# Patient Record
Sex: Male | Born: 1940 | Race: White | Hispanic: No | Marital: Married | Smoking: Former smoker
Health system: Southern US, Community
[De-identification: ages and names within clinical notes are randomized; demographics above are authoritative.]

## PROBLEM LIST (undated history)

## (undated) DIAGNOSIS — I10 Essential (primary) hypertension: Secondary | ICD-10-CM

## (undated) DIAGNOSIS — R32 Unspecified urinary incontinence: Secondary | ICD-10-CM

## (undated) DIAGNOSIS — J45909 Unspecified asthma, uncomplicated: Secondary | ICD-10-CM

## (undated) DIAGNOSIS — F32A Depression, unspecified: Secondary | ICD-10-CM

## (undated) DIAGNOSIS — C61 Malignant neoplasm of prostate: Secondary | ICD-10-CM

## (undated) DIAGNOSIS — F329 Major depressive disorder, single episode, unspecified: Secondary | ICD-10-CM

## (undated) HISTORY — DX: Unspecified urinary incontinence: R32

## (undated) HISTORY — DX: Malignant neoplasm of prostate: C61

## (undated) HISTORY — DX: Essential (primary) hypertension: I10

## (undated) HISTORY — DX: Unspecified asthma, uncomplicated: J45.909

## (undated) HISTORY — DX: Depression, unspecified: F32.A

---
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
## 1995-10-12 HISTORY — PX: GALLBLADDER SURGERY: SHX652

## 1998-10-11 HISTORY — PX: TOTAL SHOULDER REPLACEMENT: SUR1217

## 1999-10-12 HISTORY — PX: REPLACEMENT TOTAL KNEE BILATERAL: SUR1225

## 2003-10-12 HISTORY — PX: OTHER SURGICAL HISTORY: SHX169

## 2018-09-14 LAB — PULMONARY FUNCTION TEST

## 2019-11-08 ENCOUNTER — Telehealth: Payer: Self-pay | Admitting: Family Medicine

## 2019-11-08 ENCOUNTER — Ambulatory Visit (INDEPENDENT_AMBULATORY_CARE_PROVIDER_SITE_OTHER): Payer: Medicare Other | Admitting: Family Medicine

## 2019-11-08 ENCOUNTER — Other Ambulatory Visit: Payer: Self-pay

## 2019-11-08 ENCOUNTER — Encounter: Payer: Self-pay | Admitting: Family Medicine

## 2019-11-08 VITALS — BP 128/70 | HR 86 | Temp 97.2°F | Ht 67.5 in | Wt 196.0 lb

## 2019-11-08 DIAGNOSIS — F321 Major depressive disorder, single episode, moderate: Secondary | ICD-10-CM

## 2019-11-08 DIAGNOSIS — C61 Malignant neoplasm of prostate: Secondary | ICD-10-CM | POA: Insufficient documentation

## 2019-11-08 DIAGNOSIS — K219 Gastro-esophageal reflux disease without esophagitis: Secondary | ICD-10-CM | POA: Diagnosis not present

## 2019-11-08 DIAGNOSIS — Z125 Encounter for screening for malignant neoplasm of prostate: Secondary | ICD-10-CM

## 2019-11-08 DIAGNOSIS — J45909 Unspecified asthma, uncomplicated: Secondary | ICD-10-CM

## 2019-11-08 DIAGNOSIS — E785 Hyperlipidemia, unspecified: Secondary | ICD-10-CM | POA: Diagnosis not present

## 2019-11-08 DIAGNOSIS — R739 Hyperglycemia, unspecified: Secondary | ICD-10-CM | POA: Diagnosis not present

## 2019-11-08 DIAGNOSIS — E559 Vitamin D deficiency, unspecified: Secondary | ICD-10-CM | POA: Diagnosis not present

## 2019-11-08 DIAGNOSIS — J45998 Other asthma: Secondary | ICD-10-CM

## 2019-11-08 DIAGNOSIS — I10 Essential (primary) hypertension: Secondary | ICD-10-CM | POA: Diagnosis not present

## 2019-11-08 DIAGNOSIS — M5090 Cervical disc disorder, unspecified, unspecified cervical region: Secondary | ICD-10-CM

## 2019-11-08 DIAGNOSIS — Z9849 Cataract extraction status, unspecified eye: Secondary | ICD-10-CM

## 2019-11-08 LAB — LIPID PANEL
Cholesterol: 131 mg/dL (ref 0–200)
HDL: 40 mg/dL (ref >39.00)
LDL Cholesterol: 67 mg/dL (ref 0–99)
NonHDL: 91.48
Total CHOL/HDL Ratio: 3
Triglycerides: 122 mg/dL (ref 0.0–149.0)
VLDL: 24.4 mg/dL (ref 0.0–40.0)

## 2019-11-08 LAB — HEMOGLOBIN A1C: Hgb A1c MFr Bld: 6.6 % — ABNORMAL HIGH (ref 4.6–6.5)

## 2019-11-08 LAB — COMPREHENSIVE METABOLIC PANEL
ALT: 23 U/L (ref 0–53)
AST: 17 U/L (ref 0–37)
Albumin: 4.1 g/dL (ref 3.5–5.2)
Alkaline Phosphatase: 94 U/L (ref 39–117)
BUN: 18 mg/dL (ref 6–23)
CO2: 29 mEq/L (ref 19–32)
Calcium: 9.7 mg/dL (ref 8.4–10.5)
Chloride: 109 mEq/L (ref 96–112)
Creatinine, Ser: 0.63 mg/dL (ref 0.40–1.50)
GFR: 122.87 mL/min (ref >60.00)
Glucose, Bld: 128 mg/dL — ABNORMAL HIGH (ref 70–99)
Potassium: 4.3 mEq/L (ref 3.5–5.1)
Sodium: 144 mEq/L (ref 135–145)
Total Bilirubin: 0.5 mg/dL (ref 0.2–1.2)
Total Protein: 6.5 g/dL (ref 6.0–8.3)

## 2019-11-08 LAB — CBC
HCT: 43.3 % (ref 39.0–52.0)
Hemoglobin: 14.7 g/dL (ref 13.0–17.0)
MCHC: 34 g/dL (ref 30.0–36.0)
MCV: 97.3 fl (ref 78.0–100.0)
Platelets: 247 10*3/uL (ref 150.0–400.0)
RBC: 4.45 Mil/uL (ref 4.22–5.81)
RDW: 14 % (ref 11.5–15.5)
WBC: 8.1 10*3/uL (ref 4.0–10.5)

## 2019-11-08 LAB — PSA, MEDICARE: PSA: 2.04 ng/ml (ref 0.10–4.00)

## 2019-11-08 LAB — VITAMIN D 25 HYDROXY (VIT D DEFICIENCY, FRACTURES): VITD: 38.95 ng/mL (ref 30.00–100.00)

## 2019-11-08 LAB — TSH: TSH: 2.03 u[IU]/mL (ref 0.35–4.50)

## 2019-11-08 MED ORDER — CITALOPRAM HYDROBROMIDE 20 MG PO TABS: 20.0000 mg | ORAL_TABLET | Freq: Every day | ORAL | 3 refills | Status: DC

## 2019-11-08 NOTE — Telephone Encounter (Signed)
LVM for patient to call back and so we can get his insurance information to put towards his appt for today.

## 2019-11-08 NOTE — Progress Notes (Signed)
Austin [name] is a 79 y.o. male who presents today for an office visit.  Assessment/Plan:  Chronic Problems Addressed Today: Dyslipidemia Continue atorvastatin 80 mg daily and fenofibrate 200 mg daily.  Check CBC, C met, TSH, lipid panel.  Status post cataract extraction Will place referral for ophthalmologist in the area.  Prostate cancer ([place]) Stable.  Will place a referral for urology in the area.  Asthma, persistent controlled We will continue Symbicort and prednisone.  Continue albuterol as needed.  Place referral to pulmonology.  Cervical disc disease Place referral to orthopedics in the area.  Depression, major, single episode, moderate (HCC) Poorly controlled.  Will take Wellbutrin off his med list as he is not taking this and it did not work well for him in the past.  Discussed treatment options.  We will start Celexa 20 mg daily.  He will call me in a few weeks.  Gastroesophageal reflux disease Stable.  Continue omeprazole 20 mg daily.  Essential hypertension At goal.  Continue losartan 100 mg daily.  Vitamin D deficiency Check vitamin D level.  Hyperglycemia Check A1c.     Subjective:  HPI:  Patient recently moved here from [place].  He is establishing care.  He request several referrals today.  He has noticed worsening depression and anxiety over the last few months.  Thinks this is mostly related to the stress of moving to the area.  He was prescribed Wellbutrin 150 mg daily past however he stopped that several months ago.  He finds himself with frequently racing thoughts.  Has had some trouble sleeping as well.  No reported SI or HI.  His stable, chronic medical conditions are outlined below:  # Essential Hypertension - On losartan 155m daily and tolerating well  # GERD / Hiatal Hernia - On prilosec 20 mg and tolerating well   # Dyslipidemia - On Lipitor 80 mg daily and fenofibrate 200 mg daily.  # Osteoarthritis / Degeneverative Disc  Disease  - Follows with orthopedics - Uses OTC analgesics   # Asthma - Follows with pulmonology - On symbicort 2 puffs twice daily, prednisone 10 mg daily, and albuterol as needed  # Prostate Cancer - Follows with urology  # s/p cataract surgery - Follows with ophthalmology.  PMH:  The following were reviewed and entered/updated in epic: Past Medical History:  Diagnosis Date  . Asthma   . Depression   . Hypertension   . [place]state cancer ([place]   . Urinary incontinence    Patient Active Problem List   Diagnosis Date Noted  . Hyperglycemia 11/08/2019  . Vitamin D deficiency 11/08/2019  . Essential hypertension 11/08/2019  . Gastroesophageal reflux disease 11/08/2019  . Depression, major, single episode, moderate ([place] 11/08/2019  . Cervical disc disease 11/08/2019  . Asthma, persistent controlled 11/08/2019  . [place]state cancer ([place] 11/08/2019  . Status post cataract extraction 11/08/2019  . Dyslipidemia 11/08/2019   Past Surgical History:  Procedure Laterality Date  . [place] . REPLACEMENT TOTAL KNEE BILATERAL  2001   2007  . surgery replacement Bilateral 2005    Family History  Problem Relation Age of Onset  . Diabetes Brother     Medications- reviewed and updated Current Outpatient Medications  Medication Sig Dispense Refill  . albuterol (VENTOLIN HFA) 108 (90 Base) MCG/ACT inhaler SMARTSIG:2 Puff(s) By Mouth Every 4 Hours PRN    . atorvastatin (LIPITOR) 80 MG tablet Take 80 mg by mouth daily.    . Cyanocobalamin (VITAMIN B 12  PO) Take 1,000 Units by mouth.    . fenofibrate micronized (LOFIBRA) 200 MG capsule Take 200 mg by mouth daily.    Marland Kitchen losartan (COZAAR) 100 MG tablet Take 100 mg by mouth daily.    . magnesium oxide (MAG-OX) 400 MG tablet Take 1 tablet by mouth daily.    Marland Kitchen omeprazole (PRILOSEC) 20 MG capsule Take 20 mg by mouth daily.    . predniSONE (DELTASONE) 10 MG tablet 6 TABS X2DAYS, 5 TABS X2DAYS, 4 TABS X2DAYS, 3 TABS X2DAYS,  2 TABS X2DAYS THEN 1 TAB X2DAYS W/FOOD    . SYMBICORT 160-4.5 MCG/ACT inhaler SMARTSIG:2 Puff(s) By Mouth Twice Daily    . Vitamin D, Cholecalciferol, 50 MCG (2000 UT) CAPS Take by mouth.    . citalopram (CELEXA) 20 MG tablet Take 1 tablet (20 mg total) by mouth daily. 30 tablet 3   No current facility-administered medications for this visit.    Allergies-reviewed and updated No Known Allergies  Social History   Socioeconomic History  . Marital status: Married    Spouse name: Not on file  . Number of children: Not on file  . Years of education: Not on file  . Highest education level: Not on file  Occupational History  . Not on file  Tobacco Use  . Smoking status: Light Tobacco Smoker    Types: Cigars  . Smokeless tobacco: Never Used  Substance and Sexual Activity  . Alcohol use: Yes  . Drug use: Never  . Sexual activity: Not on file  Other Topics Concern  . Not on file  Social History Narrative  . Not on file   Social Determinants of Health   Financial Resource Strain:   . Difficulty of Paying Living Expenses: Not on file  Food Insecurity:   . Worried About Charity fundraiser in the Last Year: Not on file  . Ran Out of Food in the Last Year: Not on file  Transportation Needs:   . Lack of Transportation (Medical): Not on file  . Lack of Transportation (Non-Medical): Not on file  Physical Activity:   . Days of Exercise per Week: Not on file  . Minutes of Exercise per Session: Not on file  Stress:   . Feeling of Stress : Not on file  Social Connections:   . Frequency of Communication with Friends and Family: Not on file  . Frequency of Social Gatherings with Friends and Family: Not on file  . Attends Religious Services: Not on file  . Active Member of Clubs or Organizations: Not on file  . Attends Archivist Meetings: Not on file  . Marital Status: Not on file          Objective:  Physical Exam: BP 128/70   Pulse 86   Temp (!) 97.2 F (36.2 C)    Ht 5' 7.5" (1.715 m)   Wt 196 lb (88.9 kg)   SpO2 95%   BMI 30.24 kg/m   Gen: No acute distress, resting comfortably CV: Regular rate and rhythm with no murmurs appreciated Pulm: Normal work of breathing, clear to auscultation bilaterally with no crackles, wheezes, or rhonchi Neuro: Grossly normal, moves all extremities Psych: Normal affect and thought content  Time Spent: 65 minutes of total time was spent on the date of the encounter performing the following actions: chart review prior to seeing the patient, obtaining history, performing a medically necessary exam, counseling on the treatment plan, placing orders, and documenting in our EHR.  Algis Greenhouse. Jerline Pain, MD 11/08/2019 9:35 AM

## 2019-11-08 NOTE — Assessment & Plan Note (Signed)
Continue atorvastatin 80 mg daily and fenofibrate 200 mg daily.  Check CBC, C met, TSH, lipid panel.

## 2019-11-08 NOTE — Assessment & Plan Note (Signed)
Check vitamin D level 

## 2019-11-08 NOTE — Assessment & Plan Note (Signed)
Place referral to orthopedics in the area.

## 2019-11-08 NOTE — Assessment & Plan Note (Signed)
Poorly controlled.  Will take Wellbutrin off his med list as he is not taking this and it did not work well for him in the past.  Discussed treatment options.  We will start Celexa 20 mg daily.  He will call me in a few weeks.

## 2019-11-08 NOTE — Assessment & Plan Note (Signed)
At goal.  Continue losartan 100 mg daily. ?

## 2019-11-08 NOTE — Assessment & Plan Note (Signed)
Stable.  Continue omeprazole 20mg daily

## 2019-11-08 NOTE — Assessment & Plan Note (Signed)
Check A1c. 

## 2019-11-08 NOTE — Assessment & Plan Note (Signed)
Stable.  Will place a referral for urology in the area.

## 2019-11-08 NOTE — Patient Instructions (Signed)
It was very nice to see you today!  Please start the Celexa.  I will send a referral to see the pulmonologist, urologist, orthopedic, and ophthalmologist.  We will check blood work today.  Come back in 3 months, or sooner if needed.  Take care, Dr Jerline Pain  Please try these tips to maintain a healthy lifestyle:   Eat at least 3 REAL meals and 1-2 snacks per day.  Aim for no more than 5 hours between eating.  If you eat breakfast, please do so within one hour of getting up.    Each meal should contain half fruits/vegetables, one quarter protein, and one quarter carbs (no bigger than a computer mouse)   Cut down on sweet beverages. This includes juice, soda, and sweet tea.     Drink at least 1 glass of water with each meal and aim for at least 8 glasses per day   Exercise at least 150 minutes every week.

## 2019-11-08 NOTE — Assessment & Plan Note (Signed)
Will place referral for ophthalmologist in the area.

## 2019-11-08 NOTE — Assessment & Plan Note (Signed)
We will continue Symbicort and prednisone.  Continue albuterol as needed.  Place referral to pulmonology.

## 2019-11-09 NOTE — Progress Notes (Signed)
Please inform patient of the following:  Blood sugar is borderline diabetic but all of his other labs are stable. Recommend starting metformin 750mg  daily to improve numbers and lower risk of heart attack and stroke. We should recheck in 3-6 months.  Austin [name]. Jerline Pain, MD 11/09/2019 4:05 PM

## 2019-11-13 ENCOUNTER — Other Ambulatory Visit: Payer: Self-pay

## 2019-11-13 MED ORDER — METFORMIN HCL ER 750 MG PO TB24: 750.0000 mg | ORAL_TABLET | Freq: Every day | ORAL | 0 refills | Status: DC

## 2019-11-20 ENCOUNTER — Ambulatory Visit (INDEPENDENT_AMBULATORY_CARE_PROVIDER_SITE_OTHER): Payer: Medicare Other | Admitting: Orthopaedic Surgery

## 2019-11-20 ENCOUNTER — Ambulatory Visit: Payer: Self-pay

## 2019-11-20 ENCOUNTER — Other Ambulatory Visit: Payer: Self-pay

## 2019-11-20 ENCOUNTER — Encounter: Payer: Self-pay | Admitting: Orthopaedic Surgery

## 2019-11-20 VITALS — Ht 67.0 in | Wt 196.0 lb

## 2019-11-20 DIAGNOSIS — M5090 Cervical disc disorder, unspecified, unspecified cervical region: Secondary | ICD-10-CM | POA: Diagnosis not present

## 2019-11-20 DIAGNOSIS — M542 Cervicalgia: Secondary | ICD-10-CM

## 2019-11-20 NOTE — Progress Notes (Signed)
Office Visit Note   Patient: Austin [name]           Date of Birth: [birth year]           MRN: [id number] Visit Date: 11/20/2019              Requested by: Vivi Barrack, MD [address] [place],  [place] [address] PCP: Vivi Barrack, MD   Assessment & Plan: Visit Diagnoses:  1. Neck pain   2. Cervical disc disease     Plan: Patient has absence of radiculopathy.  If he develops progressive symptoms he can return.  We discussed treatment options.  This past problems are likely related to C5-6 and C6-7 but he has autofused C6-7 level.  Follow-up as needed.  Follow-Up Instructions: No follow-ups on file.   Orders:  Orders Placed This Encounter  Procedures  . XR Cervical Spine 2 or 3 views   No orders of the defined types were placed in this encounter.     Procedures: No procedures performed   Clinical Data: No additional findings.   Subjective: Chief Complaint  Patient presents with  . Neck - Pain    HPI 79 year old male had previous left shoulder hemiarthroplasty and right total shoulder done in [place] as well as bilateral total knee arthroplasties is seen for follow-up of left cervical spondylosis.  He has had previous cervical epidurals several years ago.  He has moved here permanently.  He states at times his arms go numb wake him up at night sometimes he repositions his arm and this resolves it.  He has some thumb index and middle finger numbness.  Usually does not wake him up he does not normally have to shake it.  He is used a muscle relaxant in the past.  He denies any lower extremity gait disturbance problems both knees are doing well postop.  Review of Systems positive for depression he denies suicidal ideations.  Positive hypertension dyslipidemia hyperglycemia prostate cancer in distant past vitamin D deficiency and cervical spondylosis.  Previous shoulder knee procedures as listed above.  Otherwise 14 point systems negative as it pertains  HPI.   Objective: Vital Signs: Ht 5\' 7"  (1.702 m)   Wt 196 lb (88.9 kg)   BMI 30.70 kg/m   Physical Exam Constitutional:      Appearance: He is well-developed.  HENT:     Head: Normocephalic and atraumatic.  Eyes:     Pupils: Pupils are equal, round, and reactive to light.  Neck:     Thyroid: No thyromegaly.     Trachea: No tracheal deviation.  Cardiovascular:     Rate and Rhythm: Normal rate.  Pulmonary:     Effort: Pulmonary effort is normal.     Breath sounds: No wheezing.  Abdominal:     General: Bowel sounds are normal.     Palpations: Abdomen is soft.  Skin:    General: Skin is warm and dry.     Capillary Refill: Capillary refill takes less than 2 seconds.  Neurological:     Mental Status: He is alert and oriented to person, place, and time.  Psychiatric:        Behavior: Behavior normal.        Thought Content: Thought content normal.        Judgment: Judgment normal.     Ortho Exam patient has intact upper extremity reflexes.  Minimal brachial plexus tenderness.  Some discomfort with rotation.  Good grip strength.  No thenar or  hypothenar atrophy.  Mild discomfort with carpal compression.  Normal heel toe gait.  Both knees reach full extension.  Specialty Comments:  No specialty comments available.  Imaging: XR Cervical Spine 2 or 3 views  Result Date: 11/20/2019 AP lateral cervical spine x-rays demonstrates left shoulder hemiarthroplasty.  Some disc space narrowing at C4-5 mild significant narrowing at C5-6 and ankylosis at C6-7.  Uncovertebral changes are seen on AP x-ray. Impression: Multilevel mid cervical spondylosis as described above.    PMFS History: Patient Active Problem List   Diagnosis Date Noted  . Hyperglycemia 11/08/2019  . Vitamin D deficiency 11/08/2019  . Essential hypertension 11/08/2019  . Gastroesophageal reflux disease 11/08/2019  . Depression, major, single episode, moderate ([place]) 11/08/2019  . Cervical disc disease 11/08/2019   . Asthma, persistent controlled 11/08/2019  . Prostate cancer ([place]) 11/08/2019  . Status post cataract extraction 11/08/2019  . Dyslipidemia 11/08/2019   Past Medical History:  Diagnosis Date  . Asthma   . Depression   . Hypertension   . Prostate cancer ([place])   . Urinary incontinence     Family History  Problem Relation Age of Onset  . Diabetes Brother     Past Surgical History:  Procedure Laterality Date  . [place]  . REPLACEMENT TOTAL KNEE BILATERAL  2001   2007  . surgery replacement Bilateral 2005   Social History   Occupational History  . Not on file  Tobacco Use  . Smoking status: Light Tobacco Smoker    Types: Cigars  . Smokeless tobacco: Never Used  Substance and Sexual Activity  . Alcohol use: Yes  . Drug use: Never  . Sexual activity: Not on file

## 2019-11-21 ENCOUNTER — Other Ambulatory Visit: Payer: Self-pay

## 2019-11-22 ENCOUNTER — Encounter: Payer: Self-pay | Admitting: Family Medicine

## 2019-11-22 ENCOUNTER — Ambulatory Visit (INDEPENDENT_AMBULATORY_CARE_PROVIDER_SITE_OTHER): Payer: Medicare Other | Admitting: Family Medicine

## 2019-11-22 VITALS — BP 136/78 | HR 76 | Temp 97.6°F | Ht 67.0 in | Wt 191.4 lb

## 2019-11-22 DIAGNOSIS — J45998 Other asthma: Secondary | ICD-10-CM

## 2019-11-22 DIAGNOSIS — Z9849 Cataract extraction status, unspecified eye: Secondary | ICD-10-CM

## 2019-11-22 DIAGNOSIS — R399 Unspecified symptoms and signs involving the genitourinary system: Secondary | ICD-10-CM | POA: Diagnosis not present

## 2019-11-22 DIAGNOSIS — R739 Hyperglycemia, unspecified: Secondary | ICD-10-CM

## 2019-11-22 DIAGNOSIS — F321 Major depressive disorder, single episode, moderate: Secondary | ICD-10-CM | POA: Diagnosis not present

## 2019-11-22 LAB — POC URINALSYSI DIPSTICK (AUTOMATED)
Bilirubin, UA: NEGATIVE
Blood, UA: POSITIVE
Glucose, UA: NEGATIVE
Ketones, UA: NEGATIVE
Nitrite, UA: NEGATIVE
Protein, UA: POSITIVE — AB
Spec Grav, UA: 1.015 (ref 1.010–1.025)
Urobilinogen, UA: 0.2 E.U./dL
pH, UA: 8 (ref 5.0–8.0)

## 2019-11-22 LAB — GLUCOSE, POCT (MANUAL RESULT ENTRY): POC Glucose: 96 mg/dl (ref 70–99)

## 2019-11-22 MED ORDER — CEPHALEXIN 500 MG PO CAPS: 500.0000 mg | ORAL_CAPSULE | Freq: Two times a day (BID) | ORAL | 0 refills | Status: AC

## 2019-11-22 MED ORDER — ESCITALOPRAM OXALATE 10 MG PO TABS: 10.0000 mg | ORAL_TABLET | Freq: Every day | ORAL | 5 refills | Status: DC

## 2019-11-22 NOTE — Progress Notes (Signed)
   Austin [name] is a 79 y.o. male who presents today for an office visit.  Assessment/Plan:  New/Acute Problems: UTI UA consistent with UTI.  Will start Keflex 500 mg twice daily x7 days.  Will check urine culture.  Encourage good oral hydration.  Discussed reasons to return to care.  Chronic Problems Addressed Today: Status post cataract extraction Referral to ophthalmology pending.  Asthma, persistent controlled Referral to pulmonology pending.  Depression, major, single episode, moderate (HCC) Stable but having side effects to Celexa.  We will switch to Lexapro 10 mg daily.  He will follow-up with me in a few weeks.  Hyperglycemia Point-of-care glucose 96 this morning.  We will hold off on Metformin for the time being.  Will come back in 3 months and recheck A1c at that time.     Subjective:  HPI:  Patient has been having some dysuria for the past several days.  No back pain.  No fevers or chills.  Feels like previous UTIs.  Has been treated with antibiotics in the past but does not member which ones.  He has also been having some diarrhea for the past 10 days or so.  Thinks it is due to side effect of Celexa.  Has not noticed significant change in his mood.  He would like to switch to alternative medication.  Patient was also found to have elevated blood sugar reading during lab work during his last visit.  He was started on Metformin 750 mg daily.  He would like to have his blood sugar rechecked today.       Objective:  Physical Exam: BP 136/78   Pulse 76   Temp 97.6 F (36.4 C)   Ht 5\' 7"  (1.702 m)   Wt 191 lb 6.1 oz (86.8 kg)   SpO2 98%   BMI 29.97 kg/m   Gen: No acute distress, resting comfortably CV: Regular rate and rhythm with no murmurs appreciated Pulm: Normal work of breathing, clear to auscultation bilaterally with no crackles, wheezes, or rhonchi Neuro: Grossly normal, moves all extremities Psych: Normal affect and thought content      Adelie Croswell M.  Jerline Pain, MD 11/22/2019 11:22 AM

## 2019-11-22 NOTE — Assessment & Plan Note (Signed)
Stable but having side effects to Celexa.  We will switch to Lexapro 10 mg daily.  He will follow-up with me in a few weeks.

## 2019-11-22 NOTE — Assessment & Plan Note (Signed)
Point-of-care glucose 96 this morning.  We will hold off on Metformin for the time being.  Will come back in 3 months and recheck A1c at that time.

## 2019-11-22 NOTE — Assessment & Plan Note (Signed)
Referral to pulmonology pending.

## 2019-11-22 NOTE — Assessment & Plan Note (Signed)
Referral to ophthalmology pending.

## 2019-11-22 NOTE — Patient Instructions (Addendum)
It was nice to see you!  You have a urinary tract infection. Please start the antibiotic.  We will check a urine culture to make sure you do not have a resistant bacteria. We will call you if we need to change your medications.   Please make sure you are drinking plenty of fluids over the next few days.  If your symptoms do not improve over the next 5-7 days, or if they worsen, please let us know. Please also let us know if you have worsening back pain, fevers, chills, or body aches.   Stop the metformin. We will recheck in 3 months.  Stop the celexa and start lexapro. Please let me know how it is working for you in a few weeks.   Take care, Dr Jerline Pain

## 2019-11-23 ENCOUNTER — Ambulatory Visit: Payer: Medicare Other | Attending: Internal Medicine

## 2019-11-23 DIAGNOSIS — Z23 Encounter for immunization: Secondary | ICD-10-CM

## 2019-11-23 NOTE — Progress Notes (Signed)
   Covid-19 Vaccination Clinic  Name:  Austin [name]    MRN: [id number] DOB: 19-Apr-1941  11/23/2019  [name] was observed post Covid-19 immunization for 15 minutes without incidence. He was provided with Vaccine Information Sheet and instruction to access the V-Safe system.   [name] was instructed to call 911 with any severe reactions post vaccine: Marland Kitchen Difficulty breathing  . Swelling of your face and throat  . A fast heartbeat  . A bad rash all over your body  . Dizziness and weakness    Immunizations Administered    Name Date Dose VIS Date Route   Pfizer COVID-19 Vaccine 11/23/2019 12:59 PM 0.3 mL 09/21/2019 Intramuscular   Manufacturer: [place]   Lot: [id number]   [place]: [id number]

## 2019-11-24 LAB — URINE CULTURE
MICRO NUMBER:: 10142378
SPECIMEN QUALITY:: ADEQUATE

## 2019-11-26 ENCOUNTER — Other Ambulatory Visit: Payer: Self-pay

## 2019-11-26 MED ORDER — SULFAMETHOXAZOLE-TRIMETHOPRIM 800-160 MG PO TABS: 1.0000 | ORAL_TABLET | Freq: Two times a day (BID) | ORAL | 0 refills | Status: AC

## 2019-11-26 NOTE — Progress Notes (Signed)
Please inform patient of the following:  Urine culture confirms UTI but the bacteria has resistance to the antibiotic we sent in. Please send in bactrim DS tablet take 1 tablet twice daily for 7 days.  Would like for him to let us know if symptoms are not improving.

## 2019-12-03 ENCOUNTER — Telehealth: Payer: Self-pay

## 2019-12-03 NOTE — Telephone Encounter (Signed)
Left detailed message for information to [facility] - [id number]

## 2019-12-03 NOTE — Telephone Encounter (Signed)
Patient want to know who is the pulmonary doctor that he need to schedule an appt with

## 2019-12-07 ENCOUNTER — Other Ambulatory Visit: Payer: Self-pay | Admitting: Family Medicine

## 2019-12-12 ENCOUNTER — Other Ambulatory Visit: Payer: Self-pay

## 2019-12-12 ENCOUNTER — Telehealth: Payer: Self-pay | Admitting: Family Medicine

## 2019-12-12 MED ORDER — OMEPRAZOLE 20 MG PO CPDR: 20.0000 mg | DELAYED_RELEASE_CAPSULE | Freq: Every day | ORAL | 2 refills | Status: DC

## 2019-12-12 MED ORDER — ATORVASTATIN CALCIUM 80 MG PO TABS: 80.0000 mg | ORAL_TABLET | Freq: Every day | ORAL | 2 refills | Status: DC

## 2019-12-12 MED ORDER — FENOFIBRATE MICRONIZED 200 MG PO CAPS: 200.0000 mg | ORAL_CAPSULE | Freq: Every day | ORAL | 1 refills | Status: DC

## 2019-12-12 NOTE — Telephone Encounter (Signed)
  LAST APPOINTMENT DATE: 12/07/2019   NEXT APPOINTMENT DATE:@5 /12/2019  MEDICATION:fenofibrate micronized (LOFIBRA) 200 MG capsule/omeprazole (PRILOSEC) 20 MG capsule  PHARMACY:[id number] - [place], [place] - [address]  **Let patient know to contact pharmacy at the end of the day to make sure medication is ready. **  ** Please notify patient to allow 48-72 hours to process**  **Encourage patient to contact the pharmacy for refills or they can request refills through [facility]**  CLINICAL FILLS OUT ALL BELOW:   LAST REFILL:  QTY:  REFILL DATE:    OTHER COMMENTS:    Okay for refill?  Please advise

## 2019-12-12 NOTE — Telephone Encounter (Signed)
Rx sent patient notified via voicemail

## 2019-12-14 ENCOUNTER — Ambulatory Visit (INDEPENDENT_AMBULATORY_CARE_PROVIDER_SITE_OTHER): Payer: Medicare Other | Admitting: Internal Medicine

## 2019-12-14 ENCOUNTER — Encounter: Payer: Self-pay | Admitting: Internal Medicine

## 2019-12-14 ENCOUNTER — Other Ambulatory Visit: Payer: Self-pay | Admitting: Family Medicine

## 2019-12-14 ENCOUNTER — Other Ambulatory Visit: Payer: Self-pay

## 2019-12-14 VITALS — BP 118/64 | HR 83 | Temp 97.2°F | Ht 67.0 in | Wt 195.2 lb

## 2019-12-14 DIAGNOSIS — R0602 Shortness of breath: Secondary | ICD-10-CM | POA: Diagnosis not present

## 2019-12-14 MED ORDER — SYMBICORT 160-4.5 MCG/ACT IN AERO: 2.0000 | INHALATION_SPRAY | Freq: Two times a day (BID) | RESPIRATORY_TRACT | 5 refills | Status: DC

## 2019-12-14 NOTE — Progress Notes (Signed)
Austin [name]    [id number]    Jun 14, 1941  Primary Care Physician:Parker, Algis Greenhouse, MD  Referring Physician: Vivi Barrack, [address] [place],  [place] [address] Reason for Consultation: shortness of breath Date of Consultation: 12/14/2019  Chief complaint:   Chief Complaint  Patient presents with  . Pulmonary Consult    Patient reports sob with exertion. He reports that he is in need of a new pulmonary doctor.      HPI: Moved to [place] from [place] about a year ago to be closer to family.  Diagnosed with asthma about 25 years ago. No family history of asthma Gets prednisone for asthma about once/year, never been hospitalized.  Started symbicort 2 years ago and it helped his breathing for about 6 months. Stopped taking symbicort once he started albuterol nebulizer  Symptoms of wheezing and shortness of breath are worst first thing in the morning.  Takes albuterol nebulizer twice/day for wheezing and shortness of breath.    He did have PFTs 3-5 years ago done from his pulmonary doctor in [place], unclear of results.   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  Smoking history: 1/4 ppd x20 years, also cigars occasional  Social History   Occupational History  . Not on file  Tobacco Use  . Smoking status: Former Smoker    Types: Cigars    Quit date: 10/22/2019    Years since quitting: 0.1  . Smokeless tobacco: Never Used  . Tobacco comment: smoked occ. with son. did not tolerate it long. 12/14/19.  Substance and Sexual Activity  . Alcohol use: Yes  . Drug use: Never  . Sexual activity: Not on file    Relevant family history:  Family History  Problem Relation Age of Onset  . Diabetes Brother   . Heart disease Paternal Grandfather   . Asthma Neg Hx     Past Medical History:  Diagnosis Date  . Asthma   . Depression   . Hypertension   . Prostate cancer ([place])   . Urinary  incontinence     Past Surgical History:  Procedure Laterality Date  . [place]  . REPLACEMENT TOTAL KNEE BILATERAL  2001   2007  . surgery replacement Bilateral 2005     Review of systems: Review of Systems  Constitutional: Negative for chills, fever and weight loss.  HENT: Negative for congestion, sinus pain and sore throat.   Eyes: Negative for discharge and redness.  Respiratory: Positive for cough, shortness of breath and wheezing. Negative for hemoptysis and sputum production.   Cardiovascular: Negative for chest pain, palpitations and leg swelling.  Gastrointestinal: Negative for heartburn, nausea and vomiting.  Musculoskeletal: Negative for joint pain and myalgias.  Skin: Negative for rash.  Neurological: Negative for dizziness, tremors, focal weakness and headaches.  Endo/Heme/Allergies: Negative for environmental allergies.  Psychiatric/Behavioral: Negative for depression. The patient is not nervous/anxious.   All other systems reviewed and are negative.   Physical Exam: Blood pressure 118/64, pulse 83, temperature (!) 97.2 F (36.2 C), temperature source Temporal, height 5\' 7"  (1.702 m), weight 195 lb 3.2 oz (88.5 kg), SpO2 95 %. Gen:      No acute distress ENT:  no nasal polyps, mucus membranes moist Lungs:    No increased respiratory effort, symmetric chest wall excursion, clear to auscultation bilaterally, no wheezes or crackles CV:  Soft systolic murmur, Regular rate and rhythm; no murmurs, rubs, or gallops.  No pedal edema Abd:      +distended, soft,  Neuro: normal speech, no focal facial asymmetry Psych: alert and oriented x3, normal mood and affect   Data Reviewed/Medical Decision Making:  Independent interpretation of tests: Imaging: None on file  PFTs: None on file, has had with former pulmonary   Labs: none for review  Immunization status:  Immunization History  Administered Date(s) Administered  . Fluad Quad(high Dose  65+) 07/24/2019  . PFIZER SARS-COV-2 Vaccination 11/23/2019    Assessment:  Shortness of breath - Asthma vs COPD, progressing in symptoms.   Plan/Recommendations: I've recommended he resume symbicort BID since he had benefit previously with this. Can still continue take albuterol nebulizer and MDI as needed Will obtain spirometry and FeNO as well before next visit.  We did inhaler teaching today as well.   Will fax for a release of records from [place] to obtain his former PFTs  We discussed disease management and progression at length today.   I spent 46 minutes in the care of this patient today including pre-charting, chart review, review of results, face-to-face care, coordination of care and communication with consultants etc.).  Return to Care: Return in about 2 months (around 02/13/2020).  Lenice Llamas, MD Pulmonary and [place]  CC: Vivi Barrack, MD

## 2019-12-14 NOTE — Patient Instructions (Addendum)
The patient should have follow up scheduled with myself in 2 months.   Prior to next visit patient should have: Spirometry/Feno  Please start taking Symbicort again.  Take the albuterol as needed in between for wheezing or chest tightness.  ------------------------------------------------------------------------------------------------------------------- Metered Dose Inhaler (MDI) Instructions (Albuterol, Symbicort)  Before using your inhaler for the first time: 1. Take the cap off the mouthpiece. 2. Shake the inhaler for 5 seconds 3. Press down on the canister to spray the medicine into the air. 4. Repeat these steps 3 more times. If you haven't used your inhaler in more than 2 weeks, repeat these steps before using it.  To use your inhaler: 1. Take the cap off the mouthpiece 2. Shake the inhaler for 5 seconds. 3. Hold it upright with your finger on the top of the canister and your thumb on the bottom of the inhaler. 4. Breathe out. 5. Close your lips around the mouthpiece. 6. As you start to inhale the next breath, press down on the canister. 7. Inhale deeply and slowly through your mouth. 8. Hold your breath for 5 to 10 seconds to keep the medicine in your lungs. 9. Let your breath out.   10. Repeat these steps if you are supposed to take 2 puffs.  11. Put the cap back on the mouthpiece 12. Remember to rinse, gargle and spit with water after use if your inhaler has a steroid in it (Advair, Symbicort, Dulera, Qvar, Flovent)  Caring for your MDI and chamber For most MDIs, remove the canister and rinse the plastic holder with warm running water once a week to prevent the holes from getting clogged. Shake well and let air dry. There are some medications in which the inhaler cannot be removed from the holder. These usually need to be cleaned by wiping the mouthpiece with a cloth or cleaning with a dry cotton swab. Refer to the patient instructions that come with your inhaler. Clean  the chamber about once a week. Remove the soft ring at the end of the chamber. Soak the spacer in warm water with a mild detergent. Carefully clean and, rinse, and shake off excess water. Do not hand dry. Allow to completely air dry. Do not store the chamber in a plastic bag.  Checking your MDI It is important that you know how much medication is left in your inhaler. The number of puffs contained in your MDI is printed on the side of the canister. After you have used that number of puffs, you must discard your inhaler even if it continues to spray. Keep track of how many puffs you have used. You also must include priming puffs in this total. If you use an MDI every day for control of symptoms, you can determine how long it will last by dividing the total number of puffs in the MDI by the total puffs you use every day. For example: 2 puffs x 2 times per day = 4 total puffs per day. At 120 puffs, the MDI will last 30 days. If you use an inhaler only when you need to, you must keep track of how many times you spray the inhaler. Some of the newer MDIs have counting devices built in.  If your MDI does not have a dose counter, you can obtain a device that attaches to the MDI and counts down the number of puffs each time you press the inhaler. Ask your health care professional for more information about these devices, as well as how  to best keep track of your medicine without an add-on device (if you prefer).

## 2019-12-17 ENCOUNTER — Ambulatory Visit: Payer: Medicare Other | Attending: Internal Medicine

## 2019-12-17 DIAGNOSIS — Z23 Encounter for immunization: Secondary | ICD-10-CM | POA: Insufficient documentation

## 2019-12-17 NOTE — Progress Notes (Signed)
   Covid-19 Vaccination Clinic  Name:  Austin [name]    MRN: [id number] DOB: April 01, 1941  12/17/2019  Austin [name] was observed post Covid-19 immunization for 15 minutes without incident. He was provided with Vaccine Information Sheet and instruction to access the V-Safe system.   Austin [name] was instructed to call 911 with any severe reactions post vaccine: Marland Kitchen Difficulty breathing  . Swelling of face and throat  . A fast heartbeat  . A bad rash all over body  . Dizziness and weakness   Immunizations Administered    Name Date Dose VIS Date Route   Pfizer COVID-19 Vaccine 12/17/2019 10:16 AM 0.3 mL 09/21/2019 Intramuscular   Manufacturer: [place]   Lot: [id number]   [place]: [id number]

## 2019-12-20 ENCOUNTER — Other Ambulatory Visit: Payer: BLUE CROSS/BLUE SHIELD

## 2019-12-25 ENCOUNTER — Other Ambulatory Visit: Payer: Self-pay | Admitting: *Deleted

## 2019-12-25 MED ORDER — METFORMIN HCL ER 750 MG PO TB24: ORAL_TABLET | ORAL | 0 refills | Status: DC

## 2020-01-14 ENCOUNTER — Other Ambulatory Visit (HOSPITAL_COMMUNITY)
Admission: RE | Admit: 2020-01-14 | Discharge: 2020-01-14 | Disposition: A | Discharge status: Home / Self Care | Payer: Medicare Other | Source: Ambulatory Visit | Attending: Internal Medicine | Admitting: Internal Medicine

## 2020-01-14 DIAGNOSIS — Z20822 Contact with and (suspected) exposure to covid-19: Secondary | ICD-10-CM | POA: Insufficient documentation

## 2020-01-14 DIAGNOSIS — Z01812 Encounter for preprocedural laboratory examination: Secondary | ICD-10-CM | POA: Insufficient documentation

## 2020-01-14 LAB — SARS CORONAVIRUS 2 (TAT 6-24 HRS): SARS Coronavirus 2: NEGATIVE

## 2020-01-17 ENCOUNTER — Ambulatory Visit (INDEPENDENT_AMBULATORY_CARE_PROVIDER_SITE_OTHER): Payer: Medicare Other

## 2020-01-17 ENCOUNTER — Other Ambulatory Visit: Payer: Self-pay

## 2020-01-17 DIAGNOSIS — R0602 Shortness of breath: Secondary | ICD-10-CM

## 2020-01-17 LAB — NITRIC OXIDE: Nitric Oxide: 40

## 2020-01-21 ENCOUNTER — Encounter: Payer: Self-pay | Admitting: Internal Medicine

## 2020-01-21 ENCOUNTER — Ambulatory Visit (INDEPENDENT_AMBULATORY_CARE_PROVIDER_SITE_OTHER): Payer: Medicare Other | Admitting: Internal Medicine

## 2020-01-21 ENCOUNTER — Other Ambulatory Visit: Payer: Self-pay

## 2020-01-21 VITALS — BP 150/86 | HR 95 | Temp 97.9°F | Ht 67.0 in | Wt 195.2 lb

## 2020-01-21 DIAGNOSIS — J4489 Other specified chronic obstructive pulmonary disease: Secondary | ICD-10-CM

## 2020-01-21 DIAGNOSIS — J449 Chronic obstructive pulmonary disease, unspecified: Secondary | ICD-10-CM | POA: Diagnosis not present

## 2020-01-21 DIAGNOSIS — J301 Allergic rhinitis due to pollen: Secondary | ICD-10-CM

## 2020-01-21 MED ORDER — ADVAIR HFA 230-21 MCG/ACT IN AERO: 2.0000 | INHALATION_SPRAY | Freq: Two times a day (BID) | RESPIRATORY_TRACT | 12 refills | Status: DC

## 2020-01-21 MED ORDER — ALBUTEROL SULFATE HFA 108 (90 BASE) MCG/ACT IN AERS: 1.0000 | INHALATION_SPRAY | Freq: Four times a day (QID) | RESPIRATORY_TRACT | 5 refills | Status: DC | PRN

## 2020-01-21 NOTE — Progress Notes (Signed)
Austin [name]    [id number]    07/02/1941  Primary Care Physician:Parker, Algis Greenhouse, MD Date of Appointment: 01/21/2020 Established Patient Visit  Chief complaint:   Chief Complaint  Patient presents with  . Follow-up    sob worse with pollen.  sob better while inside.     HPI: Established care for asthma last visit.   Interval Updates: Resumed symbicort at last visit but it is expensive and he is not taking regularly. It does make him feel better.  Currently on abx for bladder infection for 2 more days and stopped his PPI. Urologist is prescribing this.  Stopped singulair  Current Regimen: symbicort, albuterol Asthma Triggers: seasonal allergies Exacerbations in the last year: usually once/year.  History of hospitalization or intubation: never Hives: denies Allergy Testing: never had.  GERD: yes on prilosec.  Allergic Rhinitis: yes ACT:  Asthma Control Test ACT Total Score  01/21/2020 13   FeNO: 40 ppb  I have reviewed the patient's family social and past medical history and updated as appropriate.   Past Medical History:  Diagnosis Date  . Asthma   . Depression   . Hypertension   . Prostate cancer ([place])   . Urinary incontinence     Past Surgical History:  Procedure Laterality Date  . [place]  . REPLACEMENT TOTAL KNEE BILATERAL  2001   2007  . surgery replacement Bilateral 2005    Family History  Problem Relation Age of Onset  . Diabetes Brother   . Heart disease Paternal Grandfather   . Asthma Neg Hx     Social History   Occupational History  . Not on file  Tobacco Use  . Smoking status: Former Smoker    Types: Cigars    Quit date: 10/22/2019    Years since quitting: 0.2  . Smokeless tobacco: Never Used  . Tobacco comment: smoked occ. with son. did not tolerate it long. 12/14/19.  Substance and Sexual Activity  . Alcohol use: Yes  . Drug use: Never  . Sexual activity: Not on file    Review of  systems: Constitutional: No fevers, chills, night sweats, or weight loss. CV: No chest pain, or palpitations. Resp: No hemoptysis.  Physical Exam: Blood pressure (!) 150/86, pulse 95, temperature 97.9 F (36.6 C), temperature source Temporal, height 5\' 7"  (1.702 m), weight 195 lb 3.2 oz (88.5 kg), SpO2 92 %.  Gen:      No acute distress Lungs:    No increased respiratory effort, symmetric chest wall excursion, clear to auscultation bilaterally, no wheezes or crackles CV:         Regular rate and rhythm; no murmurs, rubs, or gallops.  No pedal edema   Data Reviewed: Imaging: I have personally reviewed the outside hospital records including pulmonary function tests and reports of CT chest from December 2019 which demonstrates air trapping, tracheobronchomalacia, mild bibasilar atelectasis  PFTs: Spirometry completed April 2021 shows moderately severe airflow limitation with an FEV1 53% of predicted.  I reviewed his PFTs from [facility] in [place] and these also demonstrated Airflow limitation with +BD response.   Labs:  Immunization status: Immunization History  Administered Date(s) Administered  . Fluad Quad(high Dose 65+) 07/24/2019  . 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  Elevated Exhaled NO.  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  He says he was taking Symbicort sparingly due to cost.  Advair appears to be covered but he will let us know. ACT is only 13 today suggesting poor control.  The patient is a minimize of his symptoms.   Return to Care: Return in about 3 months (around 04/21/2020).  Follow-up with an APP in 3 months And then circle back with me later on this year.   Lenice Llamas, MD Pul[facility] [place]

## 2020-01-21 NOTE — Patient Instructions (Addendum)
The patient should have follow up scheduled with APP in 3 months.   Stop Symbicort. Start advair. Gargle after every use. Take it 2 puffs twice a day.    By learning about asthma and how it can be controlled, you take an important step toward managing this disease. Work closely with your asthma care team to learn all you can about your asthma, how to avoid triggers, what your medications do, and how to take them correctly. With proper care, you can live free of asthma symptoms and maintain a normal, healthy lifestyle.   What is asthma? Asthma is a chronic disease that affects the airways of the lungs. During normal breathing, the bands of muscle that surround the airways are relaxed and air moves freely. During an asthma episode or "attack," there are three main changes that stop air from moving easily through the airways:  The bands of muscle that surround the airways tighten and make the airways narrow. This tightening is called bronchospasm.   The lining of the airways becomes swollen or inflamed.   The cells that line the airways produce more mucus, which is thicker than normal and clogs the airways.  These three factors - bronchospasm, inflammation, and mucus production - cause symptoms such as difficulty breathing, wheezing, and coughing.  What are the most common symptoms of asthma? Asthma symptoms are not the same for everyone. They can even change from episode to episode in the same person. Also, you may have only one symptom of asthma, such as cough, but another person may have all the symptoms of asthma. It is important to know all the symptoms of asthma and to be aware that your asthma can present in any of these ways at any time. The most common symptoms include: . Coughing, especially at night  . Shortness of breath  . Wheezing  . Chest tightness, pain, or pressure   Who is affected by asthma? Asthma affects 22 million Americans; about 6 million of these are children under  age 15. People who have a family history of asthma have an increased risk of developing the disease. Asthma is also more common in people who have allergies or who are exposed to tobacco smoke. However, anyone can develop asthma at any time. Some people may have asthma all of their lives, while others may develop it as adults.  What causes asthma? The airways in a person with asthma are very sensitive and react to many things, or "triggers." Contact with these triggers causes asthma symptoms. One of the most important parts of asthma control is to identify your triggers and then avoid them when possible. The only trigger you do not want to avoid is exercise. Pre-treatment with medicines before exercise can allow you to stay active yet avoid asthma symptoms. Common asthma triggers include: 1. Infections (colds, viruses, flu, sinus infections)  2. Exercise  3. Weather (changes in temperature and/or humidity, cold air)  4. Tobacco smoke  5. Allergens (dust mites, pollens, pets, mold spores, cockroaches, and sometimes foods)  6. Irritants (strong odors from cleaning products, perfume, wood smoke, air pollution)  7. Strong emotions such as crying or laughing hard  8. Some medications   How is asthma diagnosed? To diagnose asthma, your doctor will first review your medical history, family history, and symptoms. Your doctor will want to know any past history of breathing problems you may have had, as well as a family history of asthma, allergies, eczema (a bumpy, itchy skin rash caused by  allergies), or other lung disease. It is important that you describe your symptoms in detail (cough, wheeze, shortness of breath, chest tightness), including when and how often they occur. The doctor will perform a physical examination and listen to your heart and lungs. He or she may also order breathing tests, allergy tests, blood tests, and chest and sinus X-rays. The tests will find out if you do have asthma and if  there are any other conditions that are contributing factors.  How is asthma treated? Asthma can be controlled, but not cured. It is not normal to have frequent symptoms, trouble sleeping, or trouble completing tasks. Appropriate asthma care will prevent symptoms and visits to the emergency room and hospital. Asthma medicines are one of the mainstays of asthma treatment. The drugs used to treat asthma are explained below.  Anti-inflammatories: These are the most important drugs for most people with asthma. Anti-inflammatory drugs reduce swelling and mucus production in the airways. As a result, airways are less sensitive and less likely to react to triggers. These medications need to be taken daily and may need to be taken for several weeks before they begin to control asthma. Anti-inflammatory medicines lead to fewer symptoms, better airflow, less sensitive airways, less airway damage, and fewer asthma attacks. If taken every day, they CONTROL or prevent asthma symptoms.   Bronchodilators: These drugs relax the muscle bands that tighten around the airways. This action opens the airways, letting more air in and out of the lungs and improving breathing. Bronchodilators also help clear mucus from the lungs. As the airways open, the mucus moves more freely and can be coughed out more easily. In short-acting forms, bronchodilators RELIEVE or stop asthma symptoms by quickly opening the airways and are very helpful during an asthma episode. In long-acting forms, bronchodilators provide CONTROL of asthma symptoms and prevent asthma episodes.  Asthma drugs can be taken in a variety of ways. Inhaling the medications by using a metered dose inhaler, dry powder inhaler, or nebulizer is one way of taking asthma medicines. Oral medicines (pills or liquids you swallow) may also be prescribed.  Asthma severity Asthma is classified as either "intermittent" (comes and goes) or "persistent" (lasting). Persistent asthma is  further described as being mild, moderate, or severe. The severity of asthma is based on how often you have symptoms both during the day and night, as well as by the results of lung function tests and by how well you can perform activities. The "severity" of asthma refers to how "intense" or "strong" your asthma is.  Asthma control Asthma control is the goal of asthma treatment. Regardless of your asthma severity, it may or may not be controlled. Asthma control means: . You are able to do everything you want to do at work and home  . You have no (or minimal) asthma symptoms  . You do not wake up from your sleep or earlier than usual in the morning due to asthma  . You rarely need to use your reliever medicine (inhaler)  Another major part of your treatment is that you are happy with your asthma care and believe your asthma is controlled.  Monitoring symptoms A key part of treatment is keeping track of how well your lungs are working. Monitoring your symptoms  what they are, how and when they happen, and how severe they are  is an important part of being able to control your asthma.  Sometimes asthma is monitored using a peak flow meter. A peak flow (  PF) meter measures how fast the air comes out of your lungs. It can help you know when your asthma is getting worse, sometimes even before you have symptoms. By taking daily peak flow readings, you can learn when to adjust medications to keep asthma under good control. It is also used to create your asthma action plan (see below). Your doctor can use your peak flow readings to adjust your treatment plan in some cases.  Asthma Action Plan Based on your history and asthma severity, you and your doctor will develop a care plan called an "asthma action plan." The asthma action plan describes when and how to use your medicines, actions to take when asthma worsens, and when to seek emergency care. Make sure you understand this plan. If you do not, ask your  asthma care provider any questions you may have. Your asthma action plan is one of the keys to controlling asthma. Keep it readily available to remind you of what you need to do every day to control asthma and what you need to do when symptoms occur.  Goals of asthma therapy These are the goals of asthma treatment: . Live an active, normal life  . Prevent chronic and troublesome symptoms  . Attend work or school every day  . Perform daily activities without difficulty  . Stop urgent visits to the doctor, emergency department, or hospital  . Use and adjust medications to control asthma with few or no side effects

## 2020-02-11 ENCOUNTER — Other Ambulatory Visit: Payer: Self-pay

## 2020-02-11 ENCOUNTER — Ambulatory Visit (INDEPENDENT_AMBULATORY_CARE_PROVIDER_SITE_OTHER): Payer: Medicare Other | Admitting: Family Medicine

## 2020-02-11 ENCOUNTER — Telehealth: Payer: Self-pay | Admitting: Family Medicine

## 2020-02-11 VITALS — BP 140/88 | HR 68 | Temp 98.2°F | Ht 67.0 in | Wt 198.6 lb

## 2020-02-11 DIAGNOSIS — R739 Hyperglycemia, unspecified: Secondary | ICD-10-CM

## 2020-02-11 DIAGNOSIS — M545 Low back pain, unspecified: Secondary | ICD-10-CM

## 2020-02-11 DIAGNOSIS — G8929 Other chronic pain: Secondary | ICD-10-CM | POA: Insufficient documentation

## 2020-02-11 DIAGNOSIS — Z125 Encounter for screening for malignant neoplasm of prostate: Secondary | ICD-10-CM

## 2020-02-11 DIAGNOSIS — C61 Malignant neoplasm of prostate: Secondary | ICD-10-CM | POA: Diagnosis not present

## 2020-02-11 DIAGNOSIS — G4700 Insomnia, unspecified: Secondary | ICD-10-CM

## 2020-02-11 DIAGNOSIS — F321 Major depressive disorder, single episode, moderate: Secondary | ICD-10-CM | POA: Diagnosis not present

## 2020-02-11 LAB — PSA, MEDICARE: PSA: 2.02 ng/ml (ref 0.10–4.00)

## 2020-02-11 LAB — HEMOGLOBIN A1C: Hgb A1c MFr Bld: 6.1 % (ref 4.6–6.5)

## 2020-02-11 MED ORDER — ESCITALOPRAM OXALATE 20 MG PO TABS: 20.0000 mg | ORAL_TABLET | Freq: Every day | ORAL | 3 refills | Status: DC

## 2020-02-11 NOTE — Progress Notes (Signed)
   Austin [name] is a 79 y.o. male who presents today for an office visit.  Assessment/Plan:  Chronic Problems Addressed Today: Chronic low back [name] Stable.  No red flags.  Will place referral to physical therapy.  Will consider trial of gabapentin depending on response to physical therapy.  Prostate cancer Austin [name]) Check PSA per patient request.  Continue management per urology.  Depression, major, single episode, moderate ([facility]) Will increase Lexapro to 20 mg daily.  Discussed potential side effects.  Hyperglycemia Recheck A1c.  Not currently taking Metformin.  Insomnia Multifactorial in setting of chronic neck and back [name] and nocturia.  Will treat with physical therapy.  Also increased dose of Lexapro which should help.  May consider trial gabapentin in the future.      Subjective:  HPI:  Patient here for follow-up visit.  Since her last visit he has seen the urologist.  He requests PSA to be rechecked today.  He has a history of prostate cancer.  Has not been taking Metformin.  He was recently treated for UTI with course of Keflex which caused diarrhea.  This was switched over to Bactrim which he tolerated well.  He would like to increase his dose of Lexapro.  He has also had issues with chronic back and neck [name].  He has seen orthopedics in the past.  He would like to be referred to physical therapy.  He has had some issues with sleeping due to neck [name] and urinary frequency.       Objective:  Physical Exam: BP 140/88 (BP Location: Left Arm, Patient Position: Sitting, Cuff Size: Normal)   Pulse 68   Temp 98.2 F (36.8 C) (Temporal)   Ht 5\' 7"  (1.702 m)   Wt 198 lb 9.6 oz (90.1 kg)   SpO2 96%   BMI 31.11 kg/m   Gen: No acute distress, resting comfortably CV: Regular rate and rhythm with 2/6 systolic murmur appreciated Pulm: Normal work of breathing, clear to auscultation bilaterally with no crackles, wheezes, or rhonchi Neuro: Grossly normal, moves all  extremities Psych: Normal affect and thought content      Austin Wyre M. Jerline Pain, MD 02/11/2020 10:16 AM

## 2020-02-11 NOTE — Assessment & Plan Note (Signed)
Check PSA per patient request.  Continue management per urology.

## 2020-02-11 NOTE — Telephone Encounter (Signed)
Patient has called back in regard to labs.  I have given patient Dr. Ellwood Handler response to labs.  Patient understood and was very happy.

## 2020-02-11 NOTE — Assessment & Plan Note (Signed)
Recheck A1c.  Not currently taking Metformin.

## 2020-02-11 NOTE — Patient Instructions (Addendum)
It was very nice to see you today!  We will check your PSA and A1c levels today.  Please increase your Lexapro to 20 mg daily.  Double up the pills that you have.  I will send in a new prescription with a single pill to take once daily.  Please make sure that you are taking a probiotic.  You can take yogurt as well.  I will place a referral for you to see the physical therapist to work on your neck and back issues.  We will see you back in 6 months, or sooner if needed.  Take care, Dr Jerline Pain  Please try these tips to maintain a healthy lifestyle:   Eat at least 3 REAL meals and 1-2 snacks per day.  Aim for no more than 5 hours between eating.  If you eat breakfast, please do so within one hour of getting up.    Each meal should contain half fruits/vegetables, one quarter protein, and one quarter carbs (no bigger than a computer mouse)   Cut down on sweet beverages. This includes juice, soda, and sweet tea.     Drink at least 1 glass of water with each meal and aim for at least 8 glasses per day   Exercise at least 150 minutes every week.

## 2020-02-11 NOTE — Assessment & Plan Note (Signed)
Will increase Lexapro to 20 mg daily.  Discussed potential side effects.

## 2020-02-11 NOTE — Progress Notes (Signed)
Please inform patient of the following:  PSA level stable.  Blood sugar improved since last time.  He can continue to stay off his Metformin.  Would like for him to keep up the good work with diet and exercise and we can recheck in about 6 months.

## 2020-02-11 NOTE — Assessment & Plan Note (Signed)
Multifactorial in setting of chronic neck and back pain and nocturia.  Will treat with physical therapy.  Also increased dose of Lexapro which should help.  May consider trial gabapentin in the future.

## 2020-02-11 NOTE — Assessment & Plan Note (Signed)
Stable.  No red flags.  Will place referral to physical therapy.  Will consider trial of gabapentin depending on response to physical therapy.

## 2020-02-19 ENCOUNTER — Other Ambulatory Visit: Payer: Self-pay

## 2020-02-19 ENCOUNTER — Encounter: Payer: Self-pay | Admitting: Physical Therapy

## 2020-02-19 ENCOUNTER — Ambulatory Visit (INDEPENDENT_AMBULATORY_CARE_PROVIDER_SITE_OTHER): Payer: Medicare Other | Admitting: Physical Therapy

## 2020-02-19 DIAGNOSIS — G8929 Other chronic pain: Secondary | ICD-10-CM

## 2020-02-19 DIAGNOSIS — M545 Low back pain, unspecified: Secondary | ICD-10-CM

## 2020-02-19 DIAGNOSIS — M5412 Radiculopathy, cervical region: Secondary | ICD-10-CM | POA: Diagnosis not present

## 2020-02-19 NOTE — Therapy (Signed)
[facility] [address] [place], [place], [address] Phone: [phone]   Fax:  [phone]  Physical Therapy Evaluation  Patient Details  Name: Austin [name] MRN: [id number] Date of Birth: 1941-10-11 Referring Provider (PT): Dimas Chyle   Encounter Date: 02/19/2020  PT End of Session - 02/19/20 1333    Visit Number  1    Number of Visits  12    Date for PT Re-Evaluation  04/01/20    Authorization Type  Medicare    PT Start Time  [id number]    PT Stop Time  1100    PT Time Calculation (min)  45 min    Behavior During Therapy  [facility] for tasks assessed/performed       Past Medical History:  Diagnosis Date  . Asthma   . Depression   . Hypertension   . Prostate cancer ([place])   . Urinary incontinence     Past Surgical History:  Procedure Laterality Date  . [place]  . REPLACEMENT TOTAL KNEE BILATERAL  2001   2007  . surgery replacement Bilateral 2005    There were no vitals filed for this visit.   Subjective Assessment - 02/19/20 1023    Subjective  Pt states chronic low back pain and neck pain. He has had previous injections in neck, now bil hand tingling worsening, constant, but worse at night, wakes him up, side sleeper. He states stiffness in bil low back, worse with increased activity. Also feels balance is decreasing.    Limitations  Lifting;Standing;Walking;House hold activities    Patient Stated Goals  decreased pain in back , decreased tingling in hands from neck.    Currently in Pain?  Yes    Pain Score  2     Pain Location  Neck    Pain Orientation  Right;Left    Pain Descriptors / Indicators  Aching    Pain Type  Chronic pain    Pain Onset  More than a month ago    Pain Frequency  Intermittent    Multiple Pain Sites  Yes    Pain Location  Back    Pain Orientation  Right;Left    Pain Descriptors / Indicators  Aching    Pain Type  Chronic pain    Pain Onset  More than a month ago    Aggravating  Factors   increased activity ,standing, walking.    Pain Relieving Factors  motrin,stretches         [facility] PT Assessment - 02/19/20 0001      Assessment   Medical Diagnosis  Neck and Back pain    Referring Provider (PT)  Dimas Chyle    Prior Therapy  no      Balance Screen   Has the patient fallen in the past 6 months  No      Prior Function   Level of Independence  Independent      Cognition   Overall Cognitive Status  Within Functional Limits for tasks assessed      ROM / Strength   AROM / PROM / Strength  AROM;Strength      AROM   AROM Assessment Site  Cervical;Lumbar    Cervical Flexion  mild limitation/pain    Cervical Extension  wfl    Cervical - Right Side Bend  sig limitation    Cervical - Left Side Bend  mod/sig limitation    Cervical - Right Rotation  45 deg    Cervical -  Left Rotation  55 deg    Lumbar Flexion  can reach to ankles, very minimal flexion from back,.    Lumbar Extension  mod limitation    Lumbar - Right Side Bend  mild limitation    Lumbar - Left Side Bend  mild limitation      Strength   Overall Strength Comments  UE: 4/5, Hips: 4/5, core: 3-/5       Palpation   Palpation comment  hypomobile cervical and lumbar spine, minimal pain to palpate in cervical musculature, mild tightness in bil UTs,  Pain in bil SI region, no pain into glutes.       Special Tests   Other special tests  Neg SLR,                  Objective measurements completed on examination: See above findings.      [facility] Adult PT Treatment/Exercise - 02/19/20 0001      Exercises   Exercises  Lumbar;Neck      Neck Exercises: Seated   Other Seated Exercise  Scap retract x15;       Lumbar Exercises: Stretches   Active Hamstring Stretch  2 reps;30 seconds    Active Hamstring Stretch Limitations  seated    Single Knee to Chest Stretch  2 reps;30 seconds    Lower Trunk Rotation  5 reps;10 seconds    Pelvic Tilt  20 reps             PT Education -  02/19/20 1104    Education Details  HEP issued: [email or web address]. PT POC, exam findings.    Person(s) Educated  Patient    Methods  Explanation;Demonstration       PT Short Term Goals - 02/19/20 1335      PT SHORT TERM GOAL #1   Title  Pt to be independent with initial HEP    Time  2    Period  Weeks    Status  New    Target Date  03/04/20        PT Long Term Goals - 02/19/20 1337      PT LONG TERM GOAL #1   Title  Pt to demo improved pain in low back to 0-2/10 with activity and walking.    Time  6    Period  Weeks    Status  New    Target Date  04/01/20      PT LONG TERM GOAL #2   Title  Pt to report decreased tinlging in Bil UEs to be rated 0-2/10 intermittent.    Time  6    Period  Weeks    Status  New    Target Date  04/01/20      PT LONG TERM GOAL #3   Title  Pt to be independent with final HEP for neck and back    Time  6    Period  Weeks    Status  New    Target Date  04/01/20      PT LONG TERM GOAL #4   Title  Pt to demo improved ROM for lumbar spine to have only mild deficit for all motions, to improve ability for ADLs. and IADLS.    Time  6    Period  Weeks    Status  New    Target Date  04/01/20      PT LONG TERM GOAL #5   Title  Pt to demo improved cervical rotation on  R to be equal to L or at least 55 deg, and flexion to be WNL.    Time  6    Period  Weeks    Status  New    Target Date  04/01/20             Plan - 02/19/20 2103    Clinical Impression Statement  Pt presents with primary complaint of increased pain in neck and back. Pt with moderate stiffness and lack of cervical ROM, with tingling into bil UE and hands. He also has stiffness and lack of mobility in lumbar spine, with weakness in hips, core, and lack of effective HEP. Pt with decreased ability for full functional activieis, and will benefit from skilled PT to improve.    Personal Factors and Comorbidities  Time since onset of injury/illness/exacerbation    Examination-Activity  Limitations  Bend;Sleep;Locomotion Level;Stand;Lift    Examination-Participation Restrictions  Cleaning;Community Activity;Yard Work;Laundry;Driving    Stability/Clinical Decision Making  Stable/Uncomplicated    Clinical Decision Making  Low    Rehab Potential  Fair    PT Frequency  2x / week    PT Duration  6 weeks    PT Treatment/Interventions  ADLs/Self Care Home Management;Cryotherapy;Electrical Stimulation;Gait training;DME Instruction;Ultrasound;Traction;Moist Heat;Iontophoresis 4mg /ml Dexamethasone;Stair training;Functional mobility training;Therapeutic activities;Therapeutic exercise;Balance training;Neuromuscular re-education;Manual techniques;Patient/family education;Passive range of motion;Dry needling;Taping;Spinal Manipulations;Joint Manipulations    PT Home Exercise Plan  [email or web address]    Consulted and Agree with Plan of Care  Patient       Patient will benefit from skilled therapeutic intervention in order to improve the following deficits and impairments:  Abnormal gait, Difficulty walking, Decreased range of motion, Decreased coordination, Decreased endurance, Increased muscle spasms, Pain, Hypomobility, Decreased activity tolerance, Impaired flexibility, Improper body mechanics, Decreased strength, Decreased mobility  Visit Diagnosis: Chronic bilateral low back pain without sciatica  Radiculopathy, cervical region     Problem List Patient Active Problem List   Diagnosis Date Noted  . Chronic low back pain 02/11/2020  . Insomnia 02/11/2020  . Hyperglycemia 11/08/2019  . Vitamin D deficiency 11/08/2019  . Essential hypertension 11/08/2019  . Gastroesophageal reflux disease 11/08/2019  . Depression, major, single episode, moderate ([place]) 11/08/2019  . Cervical disc disease 11/08/2019  . Asthma, persistent controlled 11/08/2019  . Prostate cancer ([place]) 11/08/2019  . Status post cataract extraction 11/08/2019  . Dyslipidemia 11/08/2019   Lyndee Hensen, PT, DPT 9:35 PM   02/19/20     [facility] [place], [place], [address] Phone: [phone]   Fax:  [phone]  Name: Austin [name] MRN: [id number] Date of Birth: [birth year]

## 2020-02-19 NOTE — Patient Instructions (Signed)
Access Code: [email or web address] URL: [email or web address] Date: 02/19/2020 Prepared by: Lyndee Hensen  Exercises Supine Single Knee to Chest Stretch - 2 x daily - 3 reps - 30 hold Supine Posterior Pelvic Tilt - 2 x daily - 2 sets - 10 reps Supine Lower Trunk Rotation - 2 x daily - 10 reps - 10 hold Seated Hamstring Stretch - 2 x daily - 3 reps - 30 hold Seated Scapular Retraction - 2 x daily - 1 sets - 10 reps

## 2020-02-21 ENCOUNTER — Other Ambulatory Visit: Payer: Self-pay

## 2020-02-21 ENCOUNTER — Ambulatory Visit (INDEPENDENT_AMBULATORY_CARE_PROVIDER_SITE_OTHER): Payer: Medicare Other | Admitting: Physical Therapy

## 2020-02-21 ENCOUNTER — Encounter: Payer: Self-pay | Admitting: Physical Therapy

## 2020-02-21 DIAGNOSIS — M5412 Radiculopathy, cervical region: Secondary | ICD-10-CM | POA: Diagnosis not present

## 2020-02-21 DIAGNOSIS — G8929 Other chronic pain: Secondary | ICD-10-CM | POA: Diagnosis not present

## 2020-02-21 DIAGNOSIS — M545 Low back pain, unspecified: Secondary | ICD-10-CM

## 2020-02-21 NOTE — Therapy (Signed)
[facility] [address] [place], [place], [address] Phone: [phone]   Fax:  [phone]  Physical Therapy Treatment  Patient Details  Name: Austin [name] MRN: [id number] Date of Birth: 03-26-1941 Referring Provider (PT): Dimas Chyle   Encounter Date: 02/21/2020  PT End of Session - 02/21/20 1109    Visit Number  2    Number of Visits  12    Date for PT Re-Evaluation  04/01/20    Authorization Type  Medicare    PT Start Time  1100    PT Stop Time  1144    PT Time Calculation (min)  44 min    Activity Tolerance  Patient tolerated treatment well    Behavior During Therapy  [facility] for tasks assessed/performed       Past Medical History:  Diagnosis Date  . Asthma   . Depression   . Hypertension   . Prostate cancer ([place])   . Urinary incontinence     Past Surgical History:  Procedure Laterality Date  . [place]  . REPLACEMENT TOTAL KNEE BILATERAL  2001   2007  . surgery replacement Bilateral 2005    There were no vitals filed for this visit.  Subjective Assessment - 02/21/20 1108    Subjective  Pt with no new compliants.    Currently in Pain?  Yes    Pain Score  2     Pain Location  Neck    Pain Orientation  Right;Left    Pain Descriptors / Indicators  Aching    Pain Type  Chronic pain    Pain Onset  More than a month ago    Pain Frequency  Intermittent    Pain Score  4    Pain Location  Back    Pain Orientation  Right;Left    Pain Descriptors / Indicators  Aching    Pain Type  Chronic pain    Pain Onset  More than a month ago                        [facility] Adult PT Treatment/Exercise - 02/21/20 1113      Exercises   Exercises  Lumbar;Neck      Neck Exercises: Seated   Other Seated Exercise  Scap retract x15;       Lumbar Exercises: Stretches   Active Hamstring Stretch  2 reps;30 seconds    Active Hamstring Stretch Limitations  seated    Single Knee to Chest Stretch  2  reps;30 seconds    Lower Trunk Rotation  5 reps;10 seconds    Pelvic Tilt  20 reps      Lumbar Exercises: Aerobic   Recumbent Bike  L1 x 6 min;       Lumbar Exercises: Supine   Ab Set  10 reps    Bent Knee Raise  20 reps    Straight Leg Raise  10 reps      Manual Therapy   Manual Therapy  Joint mobilization;Soft tissue mobilization;Passive ROM;Manual Traction    Joint Mobilization  PA mobs c-spine gr 3     Soft tissue mobilization  Bil Cervical paraspinals, SO, R UT,     Passive ROM  Cervical flexion, SB and Rot , manual UT stretch    Manual Traction  10 sec x 10 , cervical                PT Short Term Goals -  02/19/20 1335      PT SHORT TERM GOAL #1   Title  Pt to be independent with initial HEP    Time  2    Period  Weeks    Status  New    Target Date  03/04/20        PT Long Term Goals - 02/19/20 1337      PT LONG TERM GOAL #1   Title  Pt to demo improved pain in low back to 0-2/10 with activity and walking.    Time  6    Period  Weeks    Status  New    Target Date  04/01/20      PT LONG TERM GOAL #2   Title  Pt to report decreased tinlging in Bil UEs to be rated 0-2/10 intermittent.    Time  6    Period  Weeks    Status  New    Target Date  04/01/20      PT LONG TERM GOAL #3   Title  Pt to be independent with final HEP for neck and back    Time  6    Period  Weeks    Status  New    Target Date  04/01/20      PT LONG TERM GOAL #4   Title  Pt to demo improved ROM for lumbar spine to have only mild deficit for all motions, to improve ability for ADLs. and IADLS.    Time  6    Period  Weeks    Status  New    Target Date  04/01/20      PT LONG TERM GOAL #5   Title  Pt to demo improved cervical rotation on R to be equal to L or at least 55 deg, and flexion to be WNL.    Time  6    Period  Weeks    Status  New    Target Date  04/01/20            Plan - 02/21/20 1157    Clinical Impression Statement  Pt with challenged with ppt and TA  today, improved after education and practice ,but will benefit from continued education on this.Plan to add hip flexor stretches next visit. R UT and cervical region sore and tight today, improved with manual, may benefit from DN as well.    Personal Factors and Comorbidities  Time since onset of injury/illness/exacerbation    Examination-Activity Limitations  Bend;Sleep;Locomotion Level;Stand;Lift    Examination-Participation Restrictions  Cleaning;Community Activity;Yard Work;Laundry;Driving    Stability/Clinical Decision Making  Stable/Uncomplicated    Rehab Potential  Fair    PT Frequency  2x / week    PT Duration  6 weeks    PT Treatment/Interventions  ADLs/Self Care Home Management;Cryotherapy;Electrical Stimulation;Gait training;DME Instruction;Ultrasound;Traction;Moist Heat;Iontophoresis 4mg /ml Dexamethasone;Stair training;Functional mobility training;Therapeutic activities;Therapeutic exercise;Balance training;Neuromuscular re-education;Manual techniques;Patient/family education;Passive range of motion;Dry needling;Taping;Spinal Manipulations;Joint Manipulations    PT Home Exercise Plan  [email or web address]    Consulted and Agree with Plan of Care  Patient       Patient will benefit from skilled therapeutic intervention in order to improve the following deficits and impairments:  Abnormal gait, Difficulty walking, Decreased range of motion, Decreased coordination, Decreased endurance, Increased muscle spasms, Pain, Hypomobility, Decreased activity tolerance, Impaired flexibility, Improper body mechanics, Decreased strength, Decreased mobility  Visit Diagnosis: Chronic bilateral low back pain without sciatica  Radiculopathy, cervical region     Problem List Patient Active Problem List  Diagnosis Date Noted  . Chronic low back pain 02/11/2020  . Insomnia 02/11/2020  . Hyperglycemia 11/08/2019  . Vitamin D deficiency 11/08/2019  . Essential hypertension 11/08/2019  . Gastroesophageal  reflux disease 11/08/2019  . Depression, major, single episode, moderate ([place]) 11/08/2019  . Cervical disc disease 11/08/2019  . Asthma, persistent controlled 11/08/2019  . Prostate cancer ([place]) 11/08/2019  . Status post cataract extraction 11/08/2019  . Dyslipidemia 11/08/2019   Lyndee Hensen, PT, DPT 11:58 AM  02/21/20    [facility], [place], [address] Phone: [phone]   Fax:  [phone]  Name: Austin [name] MRN: [id number] Date of Birth: 09/20/1941

## 2020-02-26 ENCOUNTER — Other Ambulatory Visit: Payer: Self-pay

## 2020-02-26 ENCOUNTER — Ambulatory Visit (INDEPENDENT_AMBULATORY_CARE_PROVIDER_SITE_OTHER): Payer: Medicare Other | Admitting: Physical Therapy

## 2020-02-26 ENCOUNTER — Encounter: Payer: Self-pay | Admitting: Physical Therapy

## 2020-02-26 DIAGNOSIS — M545 Low back pain, unspecified: Secondary | ICD-10-CM

## 2020-02-26 DIAGNOSIS — M5412 Radiculopathy, cervical region: Secondary | ICD-10-CM

## 2020-02-26 DIAGNOSIS — G8929 Other chronic pain: Secondary | ICD-10-CM | POA: Diagnosis not present

## 2020-02-26 NOTE — Therapy (Signed)
[facility] [address] [place], [place], [address] Phone: [phone]   Fax:  [phone]  Physical Therapy Treatment  Patient Details  Name: Austin [name] MRN: [id number] Date of Birth: July 21, 1941 Referring Provider (PT): Dimas Chyle   Encounter Date: 02/26/2020  PT End of Session - 02/26/20 1227    Visit Number  3    Number of Visits  12    Date for PT Re-Evaluation  04/01/20    Authorization Type  Medicare    PT Start Time  1217    PT Stop Time  1304    PT Time Calculation (min)  47 min    Activity Tolerance  Patient tolerated treatment well    Behavior During Therapy  [facility] for tasks assessed/performed       Past Medical History:  Diagnosis Date  . Asthma   . Depression   . Hypertension   . Prostate cancer ([place])   . Urinary incontinence     Past Surgical History:  Procedure Laterality Date  . [place]  . REPLACEMENT TOTAL KNEE BILATERAL  2001   2007  . surgery replacement Bilateral 2005    There were no vitals filed for this visit.  Subjective Assessment - 02/26/20 1225    Subjective  Pt states decreased pain in neck after last session. No noted relief of tingling. Back still tight/stiff, but feels better with doing HEP.    Currently in Pain?  Yes    Pain Score  2     Pain Location  Neck    Pain Orientation  Right    Pain Descriptors / Indicators  Aching    Pain Type  Chronic pain    Pain Onset  More than a month ago    Pain Frequency  Intermittent    Pain Score  4    Pain Location  Back    Pain Orientation  Right;Left    Pain Descriptors / Indicators  Aching    Pain Type  Chronic pain    Pain Onset  More than a month ago                        [facility] Adult PT Treatment/Exercise - 02/26/20 1227      Exercises   Exercises  Lumbar;Neck      Neck Exercises: Seated   Other Seated Exercise  --      Lumbar Exercises: Stretches   Active Hamstring Stretch  2 reps;30  seconds    Active Hamstring Stretch Limitations  seated    Single Knee to Chest Stretch  2 reps;30 seconds    Lower Trunk Rotation  --    Pelvic Tilt  20 reps      Lumbar Exercises: Aerobic   Recumbent Bike  L1 x 8 min;       Lumbar Exercises: Standing   Other Standing Lumbar Exercises  March x20;     Other Standing Lumbar Exercises  education on balance for HEP: L/R and A/P weight shifts x 20, staggered stance weight shifts x 20; Tandem stance 30 sec x 2 bil;        Lumbar Exercises: Supine   Ab Set  --    Bent Knee Raise  20 reps    Bridge  10 reps    Straight Leg Raise  10 reps    Straight Leg Raises Limitations  with TA      Manual Therapy  Manual Therapy  Joint mobilization;Soft tissue mobilization;Passive ROM;Manual Traction    Manual therapy comments  skilled palpation and monitoring of soft tissue with dry needling.     Joint Mobilization  PA mobs c-spine gr 3     Soft tissue mobilization  Bil Cervical paraspinals, UT,     Passive ROM  Cervical flexion, SB and Rot , manual UT stretch    Manual Traction  10 sec x 10 , cervical       Neck Exercises: Stretches   Chest Stretch  60 seconds    Chest Stretch Limitations  supine pec stretch with wrist flex/ext for nerve glide x 20 ;        Trigger Point Dry Needling - 02/26/20 0001    Consent Given?  Yes    Education Handout Provided  Yes    Muscles Treated Head and Neck  Upper trapezius    Upper Trapezius Response  Twitch reponse elicited;Palpable increased muscle length   R            PT Short Term Goals - 02/19/20 1335      PT SHORT TERM GOAL #1   Title  Pt to be independent with initial HEP    Time  2    Period  Weeks    Status  New    Target Date  03/04/20        PT Long Term Goals - 02/19/20 1337      PT LONG TERM GOAL #1   Title  Pt to demo improved pain in low back to 0-2/10 with activity and walking.    Time  6    Period  Weeks    Status  New    Target Date  04/01/20      PT LONG TERM  GOAL #2   Title  Pt to report decreased tinlging in Bil UEs to be rated 0-2/10 intermittent.    Time  6    Period  Weeks    Status  New    Target Date  04/01/20      PT LONG TERM GOAL #3   Title  Pt to be independent with final HEP for neck and back    Time  6    Period  Weeks    Status  New    Target Date  04/01/20      PT LONG TERM GOAL #4   Title  Pt to demo improved ROM for lumbar spine to have only mild deficit for all motions, to improve ability for ADLs. and IADLS.    Time  6    Period  Weeks    Status  New    Target Date  04/01/20      PT LONG TERM GOAL #5   Title  Pt to demo improved cervical rotation on R to be equal to L or at least 55 deg, and flexion to be WNL.    Time  6    Period  Weeks    Status  New    Target Date  04/01/20            Plan - 02/26/20 1639    Clinical Impression Statement  Continued ther ex for lumbar stretch and stabilization, added balane exercises for HEP at pts request. Pt with good tolerance for DN today, will assess effects next visit. Pt to benefit from continued manual for neck and pain as well as lumbar pain.    Personal Factors and Comorbidities  Time since onset of injury/illness/exacerbation    Examination-Activity Limitations  Bend;Sleep;Locomotion Level;Stand;Lift    Examination-Participation Restrictions  Cleaning;Community Activity;Yard Work;Laundry;Driving    Stability/Clinical Decision Making  Stable/Uncomplicated    Rehab Potential  Fair    PT Frequency  2x / week    PT Duration  6 weeks    PT Treatment/Interventions  ADLs/Self Care Home Management;Cryotherapy;Electrical Stimulation;Gait training;DME Instruction;Ultrasound;Traction;Moist Heat;Iontophoresis 4mg /ml Dexamethasone;Stair training;Functional mobility training;Therapeutic activities;Therapeutic exercise;Balance training;Neuromuscular re-education;Manual techniques;Patient/family education;Passive range of motion;Dry needling;Taping;Spinal Manipulations;Joint  Manipulations    PT Home Exercise Plan  [email or web address]    Consulted and Agree with Plan of Care  Patient       Patient will benefit from skilled therapeutic intervention in order to improve the following deficits and impairments:  Abnormal gait, Difficulty walking, Decreased range of motion, Decreased coordination, Decreased endurance, Increased muscle spasms, Pain, Hypomobility, Decreased activity tolerance, Impaired flexibility, Improper body mechanics, Decreased strength, Decreased mobility  Visit Diagnosis: Chronic bilateral low back pain without sciatica  Radiculopathy, cervical region     Problem List Patient Active Problem List   Diagnosis Date Noted  . Chronic low back pain 02/11/2020  . Insomnia 02/11/2020  . Hyperglycemia 11/08/2019  . Vitamin D deficiency 11/08/2019  . Essential hypertension 11/08/2019  . Gastroesophageal reflux disease 11/08/2019  . Depression, major, single episode, moderate ([place]) 11/08/2019  . Cervical disc disease 11/08/2019  . Asthma, persistent controlled 11/08/2019  . Prostate cancer ([place]) 11/08/2019  . Status post cataract extraction 11/08/2019  . Dyslipidemia 11/08/2019    Lyndee Hensen, PT, DPT 4:40 PM  02/26/20    [facility] [place], [place], [address] Phone: [phone]   Fax:  [phone]  Name: Austin [name] MRN: [id number] Date of Birth: [birth year]

## 2020-03-04 ENCOUNTER — Ambulatory Visit (INDEPENDENT_AMBULATORY_CARE_PROVIDER_SITE_OTHER): Payer: Medicare Other | Admitting: Physical Therapy

## 2020-03-04 ENCOUNTER — Encounter: Payer: Self-pay | Admitting: Physical Therapy

## 2020-03-04 ENCOUNTER — Other Ambulatory Visit: Payer: Self-pay

## 2020-03-04 DIAGNOSIS — M545 Low back pain: Secondary | ICD-10-CM

## 2020-03-04 DIAGNOSIS — G8929 Other chronic pain: Secondary | ICD-10-CM | POA: Diagnosis not present

## 2020-03-04 DIAGNOSIS — M5412 Radiculopathy, cervical region: Secondary | ICD-10-CM

## 2020-03-05 NOTE — Therapy (Signed)
[facility] [address] [place], [place], [address] Phone: [phone]   Fax:  [phone]  Physical Therapy Treatment  Patient Details  Name: Austin [name] MRN: [id number] Date of Birth: 1941-07-23 Referring Provider (PT): Dimas Chyle   Encounter Date: 03/04/2020  PT End of Session - 03/04/20 0949    Visit Number  4    Number of Visits  12    Date for PT Re-Evaluation  04/01/20    Authorization Type  Medicare    PT Start Time  0940    PT Stop Time  1020    PT Time Calculation (min)  40 min    Activity Tolerance  Patient tolerated treatment well    Behavior During Therapy  [facility] for tasks assessed/performed       Past Medical History:  Diagnosis Date  . Asthma   . Depression   . Hypertension   . Prostate cancer ([place])   . Urinary incontinence     Past Surgical History:  Procedure Laterality Date  . [place]  . REPLACEMENT TOTAL KNEE BILATERAL  2001   2007  . surgery replacement Bilateral 2005    There were no vitals filed for this visit.  Subjective Assessment - 03/04/20 0947    Subjective  Pt states decreasing pain in low back, has stiffness feeling but less pain. R side of neck still sore, and hands still tingling, especially with sleep and reading.    Currently in Pain?  Yes    Pain Location  Neck    Pain Orientation  Right    Pain Descriptors / Indicators  Aching    Pain Type  Chronic pain    Pain Onset  More than a month ago    Pain Frequency  Intermittent    Pain Score  1    Pain Location  Back    Pain Orientation  Right;Left    Pain Descriptors / Indicators  Tightness    Pain Type  Chronic pain    Pain Onset  More than a month ago                        [facility] Adult PT Treatment/Exercise - 03/04/20 0950      Exercises   Exercises  Lumbar;Neck      Neck Exercises: Theraband   Rows  Green;20 reps      Lumbar Exercises: Stretches   Active Hamstring Stretch  2  reps;30 seconds    Active Hamstring Stretch Limitations  seated    Single Knee to Chest Stretch  --    Pelvic Tilt  20 reps      Lumbar Exercises: Aerobic   Recumbent Bike  L1 x 8 min;       Lumbar Exercises: Standing   Other Standing Lumbar Exercises  March x20; Hip Abd 2x 10 bil;     Other Standing Lumbar Exercises  --      Lumbar Exercises: Supine   Bent Knee Raise  20 reps    Bridge  10 reps    Straight Leg Raise  --    Straight Leg Raises Limitations  --      Manual Therapy   Manual Therapy  Joint mobilization;Soft tissue mobilization;Passive ROM;Manual Traction    Manual therapy comments  skilled palpation and monitoring of soft tissue with dry needling.     Joint Mobilization  PA mobs c-spine gr 3     Soft tissue  mobilization  Bil Cervical paraspinals, UT,     Passive ROM  Cervical flexion, SB and Rot , manual UT stretch    Manual Traction  10 sec x 10 , cervical       Neck Exercises: Stretches   Chest Stretch  60 seconds    Chest Stretch Limitations  supine pec stretch with wrist flex/ext for nerve glide x 20 ;        Trigger Point Dry Needling - 03/05/20 0001    Consent Given?  Yes    Education Handout Provided  Previously provided    Muscles Treated Head and Neck  Upper trapezius    Upper Trapezius Response  Twitch reponse elicited;Palpable increased muscle length   R            PT Short Term Goals - 02/19/20 1335      PT SHORT TERM GOAL #1   Title  Pt to be independent with initial HEP    Time  2    Period  Weeks    Status  New    Target Date  03/04/20        PT Long Term Goals - 02/19/20 1337      PT LONG TERM GOAL #1   Title  Pt to demo improved pain in low back to 0-2/10 with activity and walking.    Time  6    Period  Weeks    Status  New    Target Date  04/01/20      PT LONG TERM GOAL #2   Title  Pt to report decreased tinlging in Bil UEs to be rated 0-2/10 intermittent.    Time  6    Period  Weeks    Status  New    Target Date   04/01/20      PT LONG TERM GOAL #3   Title  Pt to be independent with final HEP for neck and back    Time  6    Period  Weeks    Status  New    Target Date  04/01/20      PT LONG TERM GOAL #4   Title  Pt to demo improved ROM for lumbar spine to have only mild deficit for all motions, to improve ability for ADLs. and IADLS.    Time  6    Period  Weeks    Status  New    Target Date  04/01/20      PT LONG TERM GOAL #5   Title  Pt to demo improved cervical rotation on R to be equal to L or at least 55 deg, and flexion to be WNL.    Time  6    Period  Weeks    Status  New    Target Date  04/01/20            Plan - 03/05/20 1205    Clinical Impression Statement  Pt with good response for DN in upper trap today. He does have likley degeneration causing stiffness in c-spine, but is getting relief from manual and ther ex. Tingling in UEs has not been significantly reduced. Back pain is improving. Pt to benefit from continued care.    Personal Factors and Comorbidities  Time since onset of injury/illness/exacerbation    Examination-Activity Limitations  Bend;Sleep;Locomotion Level;Stand;Lift    Examination-Participation Restrictions  Cleaning;Community Activity;Yard Work;Laundry;Driving    Stability/Clinical Decision Making  Stable/Uncomplicated    Rehab Potential  Fair  PT Frequency  2x / week    PT Duration  6 weeks    PT Treatment/Interventions  ADLs/Self Care Home Management;Cryotherapy;Electrical Stimulation;Gait training;DME Instruction;Ultrasound;Traction;Moist Heat;Iontophoresis 4mg /ml Dexamethasone;Stair training;Functional mobility training;Therapeutic activities;Therapeutic exercise;Balance training;Neuromuscular re-education;Manual techniques;Patient/family education;Passive range of motion;Dry needling;Taping;Spinal Manipulations;Joint Manipulations    PT Home Exercise Plan  [email or web address]    Consulted and Agree with Plan of Care  Patient       Patient will benefit from  skilled therapeutic intervention in order to improve the following deficits and impairments:  Abnormal gait, Difficulty walking, Decreased range of motion, Decreased coordination, Decreased endurance, Increased muscle spasms, Pain, Hypomobility, Decreased activity tolerance, Impaired flexibility, Improper body mechanics, Decreased strength, Decreased mobility  Visit Diagnosis: Chronic bilateral low back pain without sciatica  Radiculopathy, cervical region     Problem List Patient Active Problem List   Diagnosis Date Noted  . Chronic low back pain 02/11/2020  . Insomnia 02/11/2020  . Hyperglycemia 11/08/2019  . Vitamin D deficiency 11/08/2019  . Essential hypertension 11/08/2019  . Gastroesophageal reflux disease 11/08/2019  . Depression, major, single episode, moderate ([place]) 11/08/2019  . Cervical disc disease 11/08/2019  . Asthma, persistent controlled 11/08/2019  . Prostate cancer ([place]) 11/08/2019  . Status post cataract extraction 11/08/2019  . Dyslipidemia 11/08/2019    Lyndee Hensen, PT, DPT 12:07 PM  03/05/20    [facility] [place], [place], [address] Phone: [phone]   Fax:  [phone]  Name: Austin [name] MRN: [id number] Date of Birth: 1941-05-16

## 2020-03-06 ENCOUNTER — Encounter: Payer: Self-pay | Admitting: Physical Therapy

## 2020-03-06 ENCOUNTER — Other Ambulatory Visit: Payer: Self-pay

## 2020-03-06 ENCOUNTER — Ambulatory Visit (INDEPENDENT_AMBULATORY_CARE_PROVIDER_SITE_OTHER): Payer: Medicare Other | Admitting: Physical Therapy

## 2020-03-06 DIAGNOSIS — G8929 Other chronic pain: Secondary | ICD-10-CM

## 2020-03-06 DIAGNOSIS — M5412 Radiculopathy, cervical region: Secondary | ICD-10-CM

## 2020-03-06 DIAGNOSIS — M545 Low back pain, unspecified: Secondary | ICD-10-CM

## 2020-03-10 ENCOUNTER — Encounter: Payer: Self-pay | Admitting: Physical Therapy

## 2020-03-10 NOTE — Therapy (Signed)
[facility] [address] [place], [place], [address] Phone: [phone]   Fax:  [phone]  Physical Therapy Treatment  Patient Details  Name: Austin [name] MRN: [id number] Date of Birth: 1941/02/22 Referring Provider (PT): Dimas Chyle   Encounter Date: 03/06/2020  PT End of Session - 03/10/20 1539    Visit Number  5    Number of Visits  12    Date for PT Re-Evaluation  04/01/20    Authorization Type  Medicare    PT Start Time  1017    PT Stop Time  1100    PT Time Calculation (min)  43 min    Activity Tolerance  Patient tolerated treatment well    Behavior During Therapy  [facility] for tasks assessed/performed       Past Medical History:  Diagnosis Date  . Asthma   . Depression   . Hypertension   . Prostate cancer ([place])   . Urinary incontinence     Past Surgical History:  Procedure Laterality Date  . [place]  . REPLACEMENT TOTAL KNEE BILATERAL  2001   2007  . surgery replacement Bilateral 2005    There were no vitals filed for this visit.  Subjective Assessment - 03/10/20 1538    Subjective  Pt states improving pain, " i think were on the right track"    Currently in Pain?  Yes    Pain Score  2     Pain Location  Neck    Pain Orientation  Right    Pain Descriptors / Indicators  Aching    Pain Type  Chronic pain    Pain Onset  More than a month ago    Pain Frequency  Intermittent    Pain Score  1    Pain Location  Back    Pain Orientation  Right;Left    Pain Descriptors / Indicators  Tightness    Pain Type  Chronic pain    Pain Onset  More than a month ago                        [facility] Adult PT Treatment/Exercise - 03/10/20 0001      Exercises   Exercises  Lumbar;Neck      Neck Exercises: Theraband   Rows  Green;20 reps      Neck Exercises: Supine   Shoulder Flexion  15 reps    Shoulder Flexion Limitations  cane      Lumbar Exercises: Stretches   Lower Trunk  Rotation  5 reps;10 seconds    Pelvic Tilt  20 reps    Piriformis Stretch  2 reps;20 seconds    Piriformis Stretch Limitations  supine      Lumbar Exercises: Aerobic   Recumbent Bike  L1 x 8 min;       Lumbar Exercises: Standing   Other Standing Lumbar Exercises  March x20; Hip Abd 2x 10 bil;       Lumbar Exercises: Supine   Bent Knee Raise  20 reps    Bridge  10 reps      Manual Therapy   Manual Therapy  Joint mobilization;Soft tissue mobilization;Passive ROM;Manual Traction    Manual therapy comments  skilled palpation and monitoring of soft tissue with dry needling.     Joint Mobilization  PA mobs c-spine gr 3     Soft tissue mobilization  Bil Cervical paraspinals, UT,     Passive ROM  Cervical flexion, SB and Rot , manual UT stretch    Manual Traction  10 sec x 10 , cervical       Neck Exercises: Stretches   Chest Stretch  60 seconds    Chest Stretch Limitations  supine pec stretch with wrist flex/ext for nerve glide x 20 ;        Trigger Point Dry Needling - 03/10/20 0001    Consent Given?  Yes    Education Handout Provided  Previously provided    Muscles Treated Head and Neck  Upper trapezius    Upper Trapezius Response  Twitch reponse elicited;Palpable increased muscle length   R            PT Short Term Goals - 02/19/20 1335      PT SHORT TERM GOAL #1   Title  Pt to be independent with initial HEP    Time  2    Period  Weeks    Status  New    Target Date  03/04/20        PT Long Term Goals - 02/19/20 1337      PT LONG TERM GOAL #1   Title  Pt to demo improved pain in low back to 0-2/10 with activity and walking.    Time  6    Period  Weeks    Status  New    Target Date  04/01/20      PT LONG TERM GOAL #2   Title  Pt to report decreased tinlging in Bil UEs to be rated 0-2/10 intermittent.    Time  6    Period  Weeks    Status  New    Target Date  04/01/20      PT LONG TERM GOAL #3   Title  Pt to be independent with final HEP for neck and  back    Time  6    Period  Weeks    Status  New    Target Date  04/01/20      PT LONG TERM GOAL #4   Title  Pt to demo improved ROM for lumbar spine to have only mild deficit for all motions, to improve ability for ADLs. and IADLS.    Time  6    Period  Weeks    Status  New    Target Date  04/01/20      PT LONG TERM GOAL #5   Title  Pt to demo improved cervical rotation on R to be equal to L or at least 55 deg, and flexion to be WNL.    Time  6    Period  Weeks    Status  New    Target Date  04/01/20            Plan - 03/10/20 1541    Clinical Impression Statement  Pt showing improvments in back pain, also improivng with ability for ther ex and HEP. Pt with good response to manual with decreased pain and improved ROM after each session. Pain improving in neck, somewhat variable. Plan to progress UE and postural strength as tolerated.    Personal Factors and Comorbidities  Time since onset of injury/illness/exacerbation    Examination-Activity Limitations  Bend;Sleep;Locomotion Level;Stand;Lift    Examination-Participation Restrictions  Cleaning;Community Activity;Yard Work;Laundry;Driving    Stability/Clinical Decision Making  Stable/Uncomplicated    Rehab Potential  Fair    PT Frequency  2x / week    PT Duration  6 weeks  PT Treatment/Interventions  ADLs/Self Care Home Management;Cryotherapy;Electrical Stimulation;Gait training;DME Instruction;Ultrasound;Traction;Moist Heat;Iontophoresis 4mg /ml Dexamethasone;Stair training;Functional mobility training;Therapeutic activities;Therapeutic exercise;Balance training;Neuromuscular re-education;Manual techniques;Patient/family education;Passive range of motion;Dry needling;Taping;Spinal Manipulations;Joint Manipulations    PT Home Exercise Plan  [email or web address]    Consulted and Agree with Plan of Care  Patient       Patient will benefit from skilled therapeutic intervention in order to improve the following deficits and impairments:   Abnormal gait, Difficulty walking, Decreased range of motion, Decreased coordination, Decreased endurance, Increased muscle spasms, Pain, Hypomobility, Decreased activity tolerance, Impaired flexibility, Improper body mechanics, Decreased strength, Decreased mobility  Visit Diagnosis: Chronic bilateral low back pain without sciatica  Radiculopathy, cervical region     Problem List Patient Active Problem List   Diagnosis Date Noted  . Chronic low back pain 02/11/2020  . Insomnia 02/11/2020  . Hyperglycemia 11/08/2019  . Vitamin D deficiency 11/08/2019  . Essential hypertension 11/08/2019  . Gastroesophageal reflux disease 11/08/2019  . Depression, major, single episode, moderate ([place]) 11/08/2019  . Cervical disc disease 11/08/2019  . Asthma, persistent controlled 11/08/2019  . Prostate cancer ([place]) 11/08/2019  . Status post cataract extraction 11/08/2019  . Dyslipidemia 11/08/2019   Lyndee Hensen, PT, DPT 3:43 PM  03/10/20    [facility] [place], [place], [address] Phone: [phone]   Fax:  [phone]  Name: Austin [name] MRN: [id number] Date of Birth: 12-Aug-1941

## 2020-03-11 ENCOUNTER — Other Ambulatory Visit: Payer: Self-pay

## 2020-03-11 ENCOUNTER — Ambulatory Visit (INDEPENDENT_AMBULATORY_CARE_PROVIDER_SITE_OTHER): Payer: Medicare Other | Admitting: Physical Therapy

## 2020-03-11 ENCOUNTER — Encounter: Payer: Self-pay | Admitting: Physical Therapy

## 2020-03-11 DIAGNOSIS — M5412 Radiculopathy, cervical region: Secondary | ICD-10-CM

## 2020-03-11 DIAGNOSIS — M545 Low back pain, unspecified: Secondary | ICD-10-CM

## 2020-03-11 DIAGNOSIS — G8929 Other chronic pain: Secondary | ICD-10-CM

## 2020-03-11 NOTE — Patient Instructions (Signed)
Access Code: [email or web address] URL: [email or web address] Date: 02/19/2020 Prepared by: Lyndee Hensen  Exercises Supine Single Knee to Chest Stretch - 2 x daily - 3 reps - 30 hold Supine Posterior Pelvic Tilt - 2 x daily - 2 sets - 10 reps Supine Lower Trunk Rotation - 2 x daily - 10 reps - 10 hold Seated Hamstring Stretch - 2 x daily - 3 reps - 30 hold Modified Tyreese Stretch - 2 x daily - 3 reps - 30 hold Seated Scapular Retraction - 2 x daily - 1 sets - 10 reps Supine Single Arm Pectoralis Stretch - 2 x daily - 3 reps - 30 hold

## 2020-03-12 ENCOUNTER — Encounter: Payer: Self-pay | Admitting: Physical Therapy

## 2020-03-12 NOTE — Therapy (Addendum)
[facility] [address] [place], [place], [address] Phone: [phone]   Fax:  [phone]  Physical Therapy Treatment  Patient Details  Name: Austin [name] MRN: [id number] Date of Birth: Nov 25, 1940 Referring Provider (PT): Dimas Chyle   Encounter Date: 03/11/2020  PT End of Session - 03/12/20 2215    Visit Number  6    Number of Visits  12    Date for PT Re-Evaluation  04/01/20    Authorization Type  Medicare    PT Start Time  1019    PT Stop Time  1100    PT Time Calculation (min)  41 min    Activity Tolerance  Patient tolerated treatment well    Behavior During Therapy  [facility] for tasks assessed/performed       Past Medical History:  Diagnosis Date  . Asthma   . Depression   . Hypertension   . Prostate cancer ([place])   . Urinary incontinence     Past Surgical History:  Procedure Laterality Date  . [place]  . REPLACEMENT TOTAL KNEE BILATERAL  2001   2007  . surgery replacement Bilateral 2005    There were no vitals filed for this visit.  Subjective Assessment - 03/12/20 2214    Subjective  Pt states improving pain in neck and back. Hands are still numb, but less often.    Currently in Pain?  Yes    Pain Score  2     Pain Location  Neck    Pain Orientation  Right;Left    Pain Descriptors / Indicators  Aching;Tightness    Pain Type  Chronic pain    Pain Onset  More than a month ago    Pain Frequency  Intermittent    Pain Score  2    Pain Location  Back    Pain Orientation  Right;Left    Pain Descriptors / Indicators  Tightness    Pain Type  Chronic pain    Pain Onset  More than a month ago                        [facility] Adult PT Treatment/Exercise - 03/12/20 0001      Exercises   Exercises  Lumbar;Neck      Neck Exercises: Theraband   Rows  Green;20 reps      Neck Exercises: Supine   Shoulder Flexion  15 reps    Shoulder Flexion Limitations  cane      Lumbar  Exercises: Stretches   Single Knee to Chest Stretch  2 reps;30 seconds    Pelvic Tilt  20 reps    Piriformis Stretch  2 reps;20 seconds    Piriformis Stretch Limitations  supine    Other Lumbar Stretch Exercise  Hip flexor stretch off side of table 1 min x 2 ;       Lumbar Exercises: Aerobic   Recumbent Bike  L1 x 8 min;       Lumbar Exercises: Supine   Bent Knee Raise  20 reps    Bridge  10 reps      Manual Therapy   Manual Therapy  Joint mobilization;Soft tissue mobilization;Passive ROM;Manual Traction    Joint Mobilization  PA mobs c-spine gr 3     Soft tissue mobilization  Bil Cervical paraspinals, UT, sub occipitals     Passive ROM  Cervical flexion, SB and Rot , manual UT stretch    Manual  Traction  10 sec x 10 , cervical       Neck Exercises: Stretches   Chest Stretch  60 seconds    Chest Stretch Limitations  supine pec stretch with wrist flex/ext for nerve glide x 20 ;     Other Neck Stretches  Doorway stretch 30 sec x 3 , 90 deg;              PT Education - 03/12/20 2214    Education Details  HEp updated    Person(s) Educated  Patient    Methods  Explanation;Demonstration;Tactile cues;Verbal cues;Handout    Comprehension  Verbalized understanding;Returned demonstration;Verbal cues required;Tactile cues required;Need further instruction;Other (comment)       PT Short Term Goals - 02/19/20 1335      PT SHORT TERM GOAL #1   Title  Pt to be independent with initial HEP    Time  2    Period  Weeks    Status  New    Target Date  03/04/20        PT Long Term Goals - 02/19/20 1337      PT LONG TERM GOAL #1   Title  Pt to demo improved pain in low back to 0-2/10 with activity and walking.    Time  6    Period  Weeks    Status  New    Target Date  04/01/20      PT LONG TERM GOAL #2   Title  Pt to report decreased tinlging in Bil UEs to be rated 0-2/10 intermittent.    Time  6    Period  Weeks    Status  New    Target Date  04/01/20      PT LONG  TERM GOAL #3   Title  Pt to be independent with final HEP for neck and back    Time  6    Period  Weeks    Status  New    Target Date  04/01/20      PT LONG TERM GOAL #4   Title  Pt to demo improved ROM for lumbar spine to have only mild deficit for all motions, to improve ability for ADLs. and IADLS.    Time  6    Period  Weeks    Status  New    Target Date  04/01/20      PT LONG TERM GOAL #5   Title  Pt to demo improved cervical rotation on R to be equal to L or at least 55 deg, and flexion to be WNL.    Time  6    Period  Weeks    Status  New    Target Date  04/01/20            Plan - 03/12/20 2134    Clinical Impression Statement  Pt with improving ROM in back, as well as improving ability for TA and stabilization. Pt with good response to manual for neck pain. Pain improving, still has mod stiffness. HEP updated, Added chest stretches and nerve glides. Pt to benefit from continued care.    Personal Factors and Comorbidities  Time since onset of injury/illness/exacerbation    Examination-Activity Limitations  Bend;Sleep;Locomotion Level;Stand;Lift    Examination-Participation Restrictions  Cleaning;Community Activity;Yard Work;Laundry;Driving    Stability/Clinical Decision Making  Stable/Uncomplicated    Rehab Potential  Fair    PT Frequency  2x / week    PT Duration  6 weeks  PT Treatment/Interventions  ADLs/Self Care Home Management;Cryotherapy;Electrical Stimulation;Gait training;DME Instruction;Ultrasound;Traction;Moist Heat;Iontophoresis 4mg /ml Dexamethasone;Stair training;Functional mobility training;Therapeutic activities;Therapeutic exercise;Balance training;Neuromuscular re-education;Manual techniques;Patient/family education;Passive range of motion;Dry needling;Taping;Spinal Manipulations;Joint Manipulations    PT Home Exercise Plan  [email or web address]    Consulted and Agree with Plan of Care  Patient       Patient will benefit from skilled therapeutic intervention in  order to improve the following deficits and impairments:  Abnormal gait, Difficulty walking, Decreased range of motion, Decreased coordination, Decreased endurance, Increased muscle spasms, Pain, Hypomobility, Decreased activity tolerance, Impaired flexibility, Improper body mechanics, Decreased strength, Decreased mobility  Visit Diagnosis: Chronic bilateral low back pain without sciatica  Radiculopathy, cervical region     Problem List Patient Active Problem List   Diagnosis Date Noted  . Chronic low back pain 02/11/2020  . Insomnia 02/11/2020  . Hyperglycemia 11/08/2019  . Vitamin D deficiency 11/08/2019  . Essential hypertension 11/08/2019  . Gastroesophageal reflux disease 11/08/2019  . Depression, major, single episode, moderate ([place]) 11/08/2019  . Cervical disc disease 11/08/2019  . Asthma, persistent controlled 11/08/2019  . Prostate cancer ([place]) 11/08/2019  . Status post cataract extraction 11/08/2019  . Dyslipidemia 11/08/2019    Lyndee Hensen, PT, DPT 10:15 PM  03/12/20    [facility] [place], [place], [address] Phone: [phone]   Fax:  [phone]  Name: Austin [name] MRN: [id number] Date of Birth: 07/04/1941

## 2020-03-13 ENCOUNTER — Other Ambulatory Visit: Payer: Self-pay

## 2020-03-13 ENCOUNTER — Ambulatory Visit (INDEPENDENT_AMBULATORY_CARE_PROVIDER_SITE_OTHER): Payer: Medicare Other | Admitting: Physical Therapy

## 2020-03-13 ENCOUNTER — Encounter: Payer: Self-pay | Admitting: Physical Therapy

## 2020-03-13 DIAGNOSIS — M545 Low back pain: Secondary | ICD-10-CM

## 2020-03-13 DIAGNOSIS — G8929 Other chronic pain: Secondary | ICD-10-CM | POA: Diagnosis not present

## 2020-03-13 DIAGNOSIS — M5412 Radiculopathy, cervical region: Secondary | ICD-10-CM

## 2020-03-14 NOTE — Therapy (Signed)
[facility] [address] [place], [place], [address] Phone: [phone]   Fax:  [phone]  Physical Therapy Treatment  Patient Details  Name: Austin [name] MRN: [id number] Date of Birth: August 06, 1941 Referring Provider (PT): Dimas Chyle   Encounter Date: 03/13/2020  PT End of Session - 03/13/20 1356    Visit Number  7    Number of Visits  12    Date for PT Re-Evaluation  04/01/20    Authorization Type  Medicare    PT Start Time  1347    PT Stop Time  1430    PT Time Calculation (min)  43 min    Activity Tolerance  Patient tolerated treatment well    Behavior During Therapy  [facility] for tasks assessed/performed       Past Medical History:  Diagnosis Date  . Asthma   . Depression   . Hypertension   . Prostate cancer ([place])   . Urinary incontinence     Past Surgical History:  Procedure Laterality Date  . [place]  . REPLACEMENT TOTAL KNEE BILATERAL  2001   2007  . surgery replacement Bilateral 2005    There were no vitals filed for this visit.  Subjective Assessment - 03/13/20 1355    Subjective  Pt states improivng pain in back, does have stiffness. Tingling in hands still present, but less intense. Pt reports decreased tingling when sleeping/napping upright in chair vs laying on side in bed.    Currently in Pain?  Yes    Pain Location  Neck    Pain Orientation  Right    Pain Descriptors / Indicators  Aching;Tightness    Pain Type  Chronic pain    Pain Onset  More than a month ago    Pain Frequency  Intermittent    Pain Score  1    Pain Location  Back    Pain Orientation  Left;Right    Pain Descriptors / Indicators  Tightness    Pain Type  Chronic pain    Pain Onset  More than a month ago                        [facility] Adult PT Treatment/Exercise - 03/13/20 1356      Exercises   Exercises  Lumbar;Neck      Neck Exercises: Theraband   Rows  Green;20 reps      Neck Exercises:  Supine   Shoulder Flexion  15 reps    Shoulder Flexion Limitations  AROM with TA      Lumbar Exercises: Stretches   Single Knee to Chest Stretch  2 reps;30 seconds    Lower Trunk Rotation  5 reps;10 seconds    Hip Flexor Stretch  5 reps;20 seconds    Hip Flexor Stretch Limitations  supine off end of table / therapist assist     Pelvic Tilt  20 reps    Piriformis Stretch  2 reps;20 seconds    Piriformis Stretch Limitations  supine    Other Lumbar Stretch Exercise  --      Lumbar Exercises: Aerobic   Recumbent Bike  L1 x 8 min;       Lumbar Exercises: Standing   Other Standing Lumbar Exercises  March, hip abd 2 x 10 bil;       Lumbar Exercises: Supine   Ab Set  10 reps    Bent Knee Raise  20 reps  Bridge  20 reps      Manual Therapy   Manual Therapy  Joint mobilization;Soft tissue mobilization;Passive ROM;Manual Traction    Joint Mobilization  PA mobs c-spine gr 3 , ant hip mobs gr 3 bil;     Soft tissue mobilization  Bil Cervical paraspinals, UT, sub occipitals     Passive ROM  Cervical flexion, SB and Rot , manual UT stretch; Manual hip flexor and quad stretch  bil;/ prone     Manual Traction  10 sec x 10 , cervical       Neck Exercises: Stretches   Chest Stretch  60 seconds    Chest Stretch Limitations  supine pec stretch with wrist flex/ext for nerve glide x 20 ;     Other Neck Stretches  Doorway stretch 30 sec x 3 , 90 deg;                PT Short Term Goals - 02/19/20 1335      PT SHORT TERM GOAL #1   Title  Pt to be independent with initial HEP    Time  2    Period  Weeks    Status  New    Target Date  03/04/20        PT Long Term Goals - 02/19/20 1337      PT LONG TERM GOAL #1   Title  Pt to demo improved pain in low back to 0-2/10 with activity and walking.    Time  6    Period  Weeks    Status  New    Target Date  04/01/20      PT LONG TERM GOAL #2   Title  Pt to report decreased tinlging in Bil UEs to be rated 0-2/10 intermittent.     Time  6    Period  Weeks    Status  New    Target Date  04/01/20      PT LONG TERM GOAL #3   Title  Pt to be independent with final HEP for neck and back    Time  6    Period  Weeks    Status  New    Target Date  04/01/20      PT LONG TERM GOAL #4   Title  Pt to demo improved ROM for lumbar spine to have only mild deficit for all motions, to improve ability for ADLs. and IADLS.    Time  6    Period  Weeks    Status  New    Target Date  04/01/20      PT LONG TERM GOAL #5   Title  Pt to demo improved cervical rotation on R to be equal to L or at least 55 deg, and flexion to be WNL.    Time  6    Period  Weeks    Status  New    Target Date  04/01/20            Plan - 03/14/20 1458    Clinical Impression Statement  Pt with continued tingling in hands. Manual continued for neck pain, stiffness, and tingling, due to positive response in previous sessions. Pt to benefit from continued care.    Personal Factors and Comorbidities  Time since onset of injury/illness/exacerbation    Examination-Activity Limitations  Bend;Sleep;Locomotion Level;Stand;Lift    Examination-Participation Restrictions  Cleaning;Community Activity;Yard Work;Laundry;Driving    Stability/Clinical Decision Making  Stable/Uncomplicated    Rehab Potential  Fair  PT Frequency  2x / week    PT Duration  6 weeks    PT Treatment/Interventions  ADLs/Self Care Home Management;Cryotherapy;Electrical Stimulation;Gait training;DME Instruction;Ultrasound;Traction;Moist Heat;Iontophoresis 4mg /ml Dexamethasone;Stair training;Functional mobility training;Therapeutic activities;Therapeutic exercise;Balance training;Neuromuscular re-education;Manual techniques;Patient/family education;Passive range of motion;Dry needling;Taping;Spinal Manipulations;Joint Manipulations    PT Home Exercise Plan  [email or web address]    Consulted and Agree with Plan of Care  Patient       Patient will benefit from skilled therapeutic intervention in  order to improve the following deficits and impairments:  Abnormal gait, Difficulty walking, Decreased range of motion, Decreased coordination, Decreased endurance, Increased muscle spasms, Pain, Hypomobility, Decreased activity tolerance, Impaired flexibility, Improper body mechanics, Decreased strength, Decreased mobility  Visit Diagnosis: Chronic bilateral low back pain without sciatica  Radiculopathy, cervical region     Problem List Patient Active Problem List   Diagnosis Date Noted  . Chronic low back pain 02/11/2020  . Insomnia 02/11/2020  . Hyperglycemia 11/08/2019  . Vitamin D deficiency 11/08/2019  . Essential hypertension 11/08/2019  . Gastroesophageal reflux disease 11/08/2019  . Depression, major, single episode, moderate ([place]) 11/08/2019  . Cervical disc disease 11/08/2019  . Asthma, persistent controlled 11/08/2019  . Prostate cancer ([place]) 11/08/2019  . Status post cataract extraction 11/08/2019  . Dyslipidemia 11/08/2019    Lyndee Hensen, PT, DPT 3:01 PM  03/14/20    [facility] [place], [place], [address] Phone: [phone]   Fax:  [phone]  Name: Austin [name] MRN: [id number] Date of Birth: 23-May-1941

## 2020-03-18 ENCOUNTER — Encounter: Payer: Self-pay | Admitting: Physical Therapy

## 2020-03-18 ENCOUNTER — Ambulatory Visit (INDEPENDENT_AMBULATORY_CARE_PROVIDER_SITE_OTHER): Payer: Medicare Other | Admitting: Physical Therapy

## 2020-03-18 ENCOUNTER — Other Ambulatory Visit: Payer: Self-pay

## 2020-03-18 DIAGNOSIS — G8929 Other chronic pain: Secondary | ICD-10-CM

## 2020-03-18 DIAGNOSIS — M5412 Radiculopathy, cervical region: Secondary | ICD-10-CM

## 2020-03-18 DIAGNOSIS — M545 Low back pain: Secondary | ICD-10-CM | POA: Diagnosis not present

## 2020-03-20 ENCOUNTER — Other Ambulatory Visit: Payer: Self-pay

## 2020-03-20 ENCOUNTER — Ambulatory Visit (INDEPENDENT_AMBULATORY_CARE_PROVIDER_SITE_OTHER): Payer: Medicare Other | Admitting: Physical Therapy

## 2020-03-20 ENCOUNTER — Encounter: Payer: Self-pay | Admitting: Physical Therapy

## 2020-03-20 DIAGNOSIS — M545 Low back pain: Secondary | ICD-10-CM

## 2020-03-20 DIAGNOSIS — G8929 Other chronic pain: Secondary | ICD-10-CM | POA: Diagnosis not present

## 2020-03-21 ENCOUNTER — Encounter: Payer: Self-pay | Admitting: Physical Therapy

## 2020-03-21 NOTE — Therapy (Signed)
[facility] [address] [place], [place], [address] Phone: [phone]   Fax:  [phone]  Physical Therapy Treatment  Patient Details  Name: Laurence Crofford MRN: [id number] Date of Birth: 1941-05-23 Referring Provider (PT): Dimas Chyle   Encounter Date: 03/18/2020   PT End of Session - 03/21/20 1421    Visit Number 8    Number of Visits 12    Date for PT Re-Evaluation 04/01/20    Authorization Type Medicare    PT Start Time 0258    PT Stop Time 1300    PT Time Calculation (min) 40 min    Activity Tolerance Patient tolerated treatment well    Behavior During Therapy [facility] for tasks assessed/performed           Past Medical History:  Diagnosis Date   Asthma    Depression    Hypertension    Prostate cancer [facility])    Urinary incontinence     Past Surgical History:  Procedure Laterality Date   [place] BILATERAL  2001   2007   surgery replacement Bilateral 2005    There were no vitals filed for this visit.   Subjective Assessment - 03/21/20 1420    Subjective Pt states stiffness in back, and stiffness in neck. Still feels neck pain/discomfort every day, at times when turning head or with certain movements.    Currently in Pain? Yes    Pain Score 2     Pain Location Neck    Pain Orientation Right    Pain Descriptors / Indicators Aching;Tightness    Pain Type Chronic pain    Pain Onset More than a month ago    Pain Frequency Intermittent    Pain Score 2    Pain Location Back    Pain Orientation Right;Left    Pain Descriptors / Indicators Tightness    Pain Type Chronic pain    Pain Onset More than a month ago    Pain Frequency Intermittent                             [facility] Adult PT Treatment/Exercise - 03/21/20 0001      Exercises   Exercises Lumbar;Neck      Neck Exercises: Theraband   Rows Green;20 reps      Neck Exercises: Standing    Other Standing Exercises Shoulder Flexion AROM x 15 standing full flexion with education on posture for shoulders, neck and back.       Lumbar Exercises: Stretches   Lower Trunk Rotation 5 reps;10 seconds    Hip Flexor Stretch 5 reps;20 seconds    Hip Flexor Stretch Limitations supine off end of table / therapist assist     Pelvic Tilt 10 reps    Other Lumbar Stretch Exercise standing hip flexor stretch 30 sec x 2 bil ;      Lumbar Exercises: Aerobic   Recumbent Bike L1 x 8 min;       Lumbar Exercises: Standing   Other Standing Lumbar Exercises Walk/March 10 ft x 4;     Other Standing Lumbar Exercises Standing March x 20;       Lumbar Exercises: Supine   Ab Set 10 reps    Bent Knee Raise 20 reps    Bridge 20 reps      Manual Therapy   Manual Therapy Joint mobilization;Soft tissue mobilization;Passive ROM;Manual Traction  Joint Mobilization PA mobs c-spine gr 3 ,     Soft tissue mobilization Bil Cervical paraspinals, R UT,     Passive ROM Cervical flexion, SB and Rot , manual UT stretch;     Manual Traction 10 sec x 10 , cervical       Neck Exercises: Stretches   Chest Stretch 60 seconds    Chest Stretch Limitations supine pec stretch with wrist flex/ext for nerve glide x 20 ;     Other Neck Stretches Doorway stretch 30 sec x 3 , 90 deg;                     PT Short Term Goals - 02/19/20 1335      PT SHORT TERM GOAL #1   Title Pt to be independent with initial HEP    Time 2    Period Weeks    Status New    Target Date 03/04/20             PT Long Term Goals - 02/19/20 1337      PT LONG TERM GOAL #1   Title Pt to demo improved pain in low back to 0-2/10 with activity and walking.    Time 6    Period Weeks    Status New    Target Date 04/01/20      PT LONG TERM GOAL #2   Title Pt to report decreased tinlging in Bil UEs to be rated 0-2/10 intermittent.    Time 6    Period Weeks    Status New    Target Date 04/01/20      PT LONG TERM GOAL #3    Title Pt to be independent with final HEP for neck and back    Time 6    Period Weeks    Status New    Target Date 04/01/20      PT LONG TERM GOAL #4   Title Pt to demo improved ROM for lumbar spine to have only mild deficit for all motions, to improve ability for ADLs. and IADLS.    Time 6    Period Weeks    Status New    Target Date 04/01/20      PT LONG TERM GOAL #5   Title Pt to demo improved cervical rotation on R to be equal to L or at least 55 deg, and flexion to be WNL.    Time 6    Period Weeks    Status New    Target Date 04/01/20                 Plan - 03/21/20 1422    Clinical Impression Statement Pt with improving pain and function in back, complains of stiffness, but not pain. R side of neck still sore at times with rotation and daily activities. Bil hand numbness improving with intensity and frequency, but still present. Pt with moderate stiffness in C-spine, which limits ROM, but has good response to manual with mobilization and DTM.    Personal Factors and Comorbidities Time since onset of injury/illness/exacerbation    Examination-Activity Limitations Bend;Sleep;Locomotion Level;Stand;Lift    Examination-Participation Restrictions Cleaning;Community Activity;Yard Work;Laundry;Driving    Stability/Clinical Decision Making Stable/Uncomplicated    Rehab Potential Fair    PT Frequency 2x / week    PT Duration 6 weeks    PT Treatment/Interventions ADLs/Self Care Home Management;Cryotherapy;Electrical Stimulation;Gait training;DME Instruction;Ultrasound;Traction;Moist Heat;Iontophoresis 4mg /ml Dexamethasone;Stair training;Functional mobility training;Therapeutic activities;Therapeutic exercise;Balance training;Neuromuscular re-education;Manual techniques;Patient/family education;Passive  range of motion;Dry needling;Taping;Spinal Manipulations;Joint Manipulations    PT Home Exercise Plan [email or web address]    Consulted and Agree with Plan of Care Patient            Patient will benefit from skilled therapeutic intervention in order to improve the following deficits and impairments:  Abnormal gait, Difficulty walking, Decreased range of motion, Decreased coordination, Decreased endurance, Increased muscle spasms, Pain, Hypomobility, Decreased activity tolerance, Impaired flexibility, Improper body mechanics, Decreased strength, Decreased mobility  Visit Diagnosis: Chronic bilateral low back pain without sciatica  Radiculopathy, cervical region     Problem List Patient Active Problem List   Diagnosis Date Noted   Chronic low back pain 02/11/2020   Insomnia 02/11/2020   Hyperglycemia 11/08/2019   Vitamin D deficiency 11/08/2019   Essential hypertension 11/08/2019   Gastroesophageal reflux disease 11/08/2019   Depression, major, single episode, moderate ([place]) 11/08/2019   Cervical disc disease 11/08/2019   Asthma, persistent controlled 11/08/2019   Prostate cancer ([place]) 11/08/2019   Status post cataract extraction 11/08/2019   Dyslipidemia 11/08/2019    Lyndee Hensen, PT, DPT 2:37 PM  03/21/20    [facility] [address] [place], [place], [address] Phone: [phone]   Fax:  [phone]  Name: Steffan Caniglia MRN: [id number] Date of Birth: 13-Aug-1941

## 2020-03-21 NOTE — Therapy (Signed)
[facility] [address] [place], [place], [address] Phone: [phone]   Fax:  [phone]  Physical Therapy Treatment  Patient Details  Name: Austin [name] MRN: [id number] Date of Birth: 07/22/1941 Referring Provider (PT): Dimas Chyle   Encounter Date: 03/20/2020   PT End of Session - 03/21/20 1438    Visit Number 9    Number of Visits 12    Date for PT Re-Evaluation 04/01/20    Authorization Type Medicare    PT Start Time 1220    PT Stop Time 1302    PT Time Calculation (min) 42 min    Activity Tolerance Patient tolerated treatment well    Behavior During Therapy [facility] for tasks assessed/performed           Past Medical History:  Diagnosis Date  . Asthma   . Depression   . Hypertension   . Prostate cancer ([place])   . Urinary incontinence     Past Surgical History:  Procedure Laterality Date  . [place]  . REPLACEMENT TOTAL KNEE BILATERAL  2001   2007  . surgery replacement Bilateral 2005    There were no vitals filed for this visit.   Subjective Assessment - 03/21/20 1437    Subjective Pt states stiffness in neck and back.    Currently in Pain? Yes    Pain Score 2     Pain Location Neck    Pain Orientation Right    Pain Descriptors / Indicators Aching;Tightness    Pain Type Chronic pain    Pain Onset More than a month ago    Pain Frequency Intermittent    Pain Score 2    Pain Location Back    Pain Orientation Right;Left    Pain Descriptors / Indicators Tightness    Pain Type Chronic pain    Pain Onset More than a month ago    Pain Frequency Intermittent                             [facility] Adult PT Treatment/Exercise - 03/21/20 1442      Exercises   Exercises Lumbar;Neck      Neck Exercises: Theraband   Rows Green;20 reps      Neck Exercises: Standing   Other Standing Exercises Shoulder Flexion AROM x 15 standing full flexion with education on posture for  shoulders, neck and back.       Neck Exercises: Seated   Neck Retraction 15 reps      Lumbar Exercises: Stretches   Active Hamstring Stretch 2 reps;30 seconds    Active Hamstring Stretch Limitations seated    Lower Trunk Rotation 5 reps;10 seconds    Pelvic Tilt 15 reps    Other Lumbar Stretch Exercise standing hip flexor stretch 30 sec x 2 bil ;      Lumbar Exercises: Aerobic   Recumbent Bike L1 x 8 min;       Lumbar Exercises: Supine   Bent Knee Raise 20 reps    Bridge 20 reps    Straight Leg Raise 10 reps    Straight Leg Raises Limitations with TA      Manual Therapy   Manual Therapy Joint mobilization;Soft tissue mobilization;Passive ROM;Manual Traction    Manual therapy comments skilled palpation and monitoring of soft tissue with dry needling.     Joint Mobilization PA mobs c-spine gr 3 , side glides,     Soft  tissue mobilization Bil Cervical paraspinals, UT, sub occipitals     Passive ROM Cervical flexion, SB and Rot , manual UT stretch; Manual hip flexor and quad stretch  bil;/ prone     Manual Traction 10 sec x 10 , cervical       Neck Exercises: Stretches   Chest Stretch 60 seconds    Chest Stretch Limitations supine pec stretch with wrist flex/ext for nerve glide x 20 ;             Trigger Point Dry Needling - 03/21/20 0001    Consent Given? Yes    Education Handout Provided Previously provided    Muscles Treated Head and Neck Upper trapezius;Splenius capitus    Upper Trapezius Response Twitch reponse elicited;Palpable increased muscle length   R   Splenius capitus Response Palpable increased muscle length   R               PT Education - 03/21/20 1438    Education Details HEP reviewed    Person(s) Educated Patient    Methods Explanation;Demonstration;Verbal cues;Handout    Comprehension Verbalized understanding;Returned demonstration;Verbal cues required;Need further instruction            PT Short Term Goals - 02/19/20 1335      PT SHORT  TERM GOAL #1   Title Pt to be independent with initial HEP    Time 2    Period Weeks    Status New    Target Date 03/04/20             PT Long Term Goals - 02/19/20 1337      PT LONG TERM GOAL #1   Title Pt to demo improved pain in low back to 0-2/10 with activity and walking.    Time 6    Period Weeks    Status New    Target Date 04/01/20      PT LONG TERM GOAL #2   Title Pt to report decreased tinlging in Bil UEs to be rated 0-2/10 intermittent.    Time 6    Period Weeks    Status New    Target Date 04/01/20      PT LONG TERM GOAL #3   Title Pt to be independent with final HEP for neck and back    Time 6    Period Weeks    Status New    Target Date 04/01/20      PT LONG TERM GOAL #4   Title Pt to demo improved ROM for lumbar spine to have only mild deficit for all motions, to improve ability for ADLs. and IADLS.    Time 6    Period Weeks    Status New    Target Date 04/01/20      PT LONG TERM GOAL #5   Title Pt to demo improved cervical rotation on R to be equal to L or at least 55 deg, and flexion to be WNL.    Time 6    Period Weeks    Status New    Target Date 04/01/20                 Plan - 03/21/20 1440    Clinical Impression Statement Pts Pain is more chronic in nature and pt continues to have stiffness feelings in neck and back. He is good pain relief from sesions in neck, but stiffness continues, and carryover for pain relief has been variable. Pt benefitting from continued education on  ther ex and HEP, and requires mod-max cueing for correct performance of exercises. Pt to be seen next week, then likley d/c to HEP.    Personal Factors and Comorbidities Time since onset of injury/illness/exacerbation    Examination-Activity Limitations Bend;Sleep;Locomotion Level;Stand;Lift    Examination-Participation Restrictions Cleaning;Community Activity;Yard Work;Laundry;Driving    Stability/Clinical Decision Making Stable/Uncomplicated    Rehab  Potential Fair    PT Frequency 2x / week    PT Duration 6 weeks    PT Treatment/Interventions ADLs/Self Care Home Management;Cryotherapy;Electrical Stimulation;Gait training;DME Instruction;Ultrasound;Traction;Moist Heat;Iontophoresis 4mg /ml Dexamethasone;Stair training;Functional mobility training;Therapeutic activities;Therapeutic exercise;Balance training;Neuromuscular re-education;Manual techniques;Patient/family education;Passive range of motion;Dry needling;Taping;Spinal Manipulations;Joint Manipulations    PT Home Exercise Plan [email or web address]    Consulted and Agree with Plan of Care Patient           Patient will benefit from skilled therapeutic intervention in order to improve the following deficits and impairments:  Abnormal gait, Difficulty walking, Decreased range of motion, Decreased coordination, Decreased endurance, Increased muscle spasms, Pain, Hypomobility, Decreased activity tolerance, Impaired flexibility, Improper body mechanics, Decreased strength, Decreased mobility  Visit Diagnosis: Chronic bilateral low back pain without sciatica     Problem List Patient Active Problem List   Diagnosis Date Noted  . Chronic low back pain 02/11/2020  . Insomnia 02/11/2020  . Hyperglycemia 11/08/2019  . Vitamin D deficiency 11/08/2019  . Essential hypertension 11/08/2019  . Gastroesophageal reflux disease 11/08/2019  . Depression, major, single episode, moderate ([place]) 11/08/2019  . Cervical disc disease 11/08/2019  . Asthma, persistent controlled 11/08/2019  . Prostate cancer ([place]) 11/08/2019  . Status post cataract extraction 11/08/2019  . Dyslipidemia 11/08/2019    Lyndee Hensen, PT, DPT 2:46 PM  03/21/20    [facility] [place], [place], [address] Phone: [phone]   Fax:  [phone]  Name: Emrick Hensch MRN: [id number] Date of Birth: 25-Nov-1940

## 2020-03-25 ENCOUNTER — Other Ambulatory Visit: Payer: Self-pay

## 2020-03-25 ENCOUNTER — Encounter: Payer: BLUE CROSS/BLUE SHIELD | Admitting: Physical Therapy

## 2020-03-25 ENCOUNTER — Ambulatory Visit (INDEPENDENT_AMBULATORY_CARE_PROVIDER_SITE_OTHER): Payer: Medicare Other | Admitting: Physical Therapy

## 2020-03-25 DIAGNOSIS — M545 Low back pain, unspecified: Secondary | ICD-10-CM

## 2020-03-25 DIAGNOSIS — M5412 Radiculopathy, cervical region: Secondary | ICD-10-CM | POA: Diagnosis not present

## 2020-03-25 DIAGNOSIS — G8929 Other chronic pain: Secondary | ICD-10-CM | POA: Diagnosis not present

## 2020-03-26 ENCOUNTER — Encounter: Payer: Self-pay | Admitting: Physical Therapy

## 2020-03-26 NOTE — Therapy (Signed)
[facility] [address] [place], [place], [address] Phone: [phone]   Fax:  [phone]  Physical Therapy Treatment  Physical Therapy Progress Note  Dates of Reporting Period:  02/19/20  to 03/25/20     Patient Details  Name: Austin [name] MRN: [id number] Date of Birth: 03/16/1941 Referring Provider (PT): Dimas Chyle   Encounter Date: 03/25/2020   PT End of Session - 03/26/20 1325    Visit Number 10    Number of Visits 12    Date for PT Re-Evaluation 04/01/20    Authorization Type Medicare    PT Start Time 2025    PT Stop Time 1558    PT Time Calculation (min) 43 min    Activity Tolerance Patient tolerated treatment well    Behavior During Therapy [facility] for tasks assessed/performed           Past Medical History:  Diagnosis Date  . Asthma   . Depression   . Hypertension   . Prostate cancer ([place])   . Urinary incontinence     Past Surgical History:  Procedure Laterality Date  . [place]  . REPLACEMENT TOTAL KNEE BILATERAL  2001   2007  . surgery replacement Bilateral 2005    There were no vitals filed for this visit.   Subjective Assessment - 03/26/20 1324    Subjective Pt states stiffness. Obtained a home cervical traction unit.    Currently in Pain? Yes    Pain Location Neck    Pain Orientation Right    Pain Descriptors / Indicators Aching;Tightness    Pain Type Chronic pain    Pain Onset More than a month ago    Pain Frequency Intermittent    Pain Score 2    Pain Location Back    Pain Orientation Right;Left    Pain Descriptors / Indicators Tightness    Pain Type Chronic pain    Pain Onset More than a month ago    Pain Frequency Intermittent              [facility] PT Assessment - 03/26/20 0001      AROM   Cervical - Right Side Bend sig limitation    Cervical - Left Side Bend mod/sig limitation    Cervical - Right Rotation 50    Lumbar Flexion mild limitation    Lumbar  Extension mod limitation    Lumbar - Right Side Bend mild limitation    Lumbar - Left Side Bend mild limitation      Strength   Overall Strength Comments hips: 4+/5                          [facility] Adult PT Treatment/Exercise - 03/26/20 0001      Self-Care   Self-Care Other Self-Care Comments    Other Self-Care Comments  set up and use of home cervical traction device       Lumbar Exercises: Stretches   Active Hamstring Stretch 2 reps;30 seconds    Active Hamstring Stretch Limitations seated    Lower Trunk Rotation 5 reps;10 seconds    Hip Flexor Stretch 5 reps;20 seconds    Hip Flexor Stretch Limitations supine off end of table / therapist assist     Pelvic Tilt 20 reps      Lumbar Exercises: Aerobic   Recumbent Bike L1 x 8 min;       Lumbar Exercises: Standing   Other Standing  Lumbar Exercises Standing March x 20; HIp ad 2 x 10 bil;       Lumbar Exercises: Supine   Bent Knee Raise 20 reps    Bridge 20 reps      Manual Therapy   Manual therapy comments skilled palpation and monitoring of soft tissue with dry needling.     Joint Mobilization PA mobs c-spine gr 3 , side glides,     Soft tissue mobilization Bil Cervical paraspinals, UT, sub occipitals     Passive ROM Cervical flexion, SB and Rot , manual UT stretch; Manual hip flexor and quad stretch  bil;/ prone     Manual Traction 10 sec x 10 , cervical       Neck Exercises: Stretches   Chest Stretch 60 seconds    Chest Stretch Limitations supine pec stretch with wrist flex/ext for nerve glide x 20 ;             Trigger Point Dry Needling - 03/26/20 0001    Consent Given? Yes    Education Handout Provided Previously provided    Muscles Treated Head and Neck Upper trapezius;Levator scapulae    Upper Trapezius Response Twitch reponse elicited;Palpable increased muscle length    Levator Scapulae Response Twitch response elicited;Palpable increased muscle length                  PT Short Term  Goals - 03/26/20 1326      PT SHORT TERM GOAL #1   Title Pt to be independent with initial HEP    Time 2    Period Weeks    Status Achieved    Target Date 03/04/20             PT Long Term Goals - 03/26/20 1326      PT LONG TERM GOAL #1   Title Pt to demo improved pain in low back to 0-2/10 with activity and walking.    Time 6    Period Weeks    Status Partially Met      PT LONG TERM GOAL #2   Title Pt to report decreased tinlging in Bil UEs to be rated 0-2/10 intermittent.    Time 6    Period Weeks    Status Partially Met      PT LONG TERM GOAL #3   Title Pt to be independent with final HEP for neck and back    Time 6    Period Weeks    Status Partially Met      PT LONG TERM GOAL #4   Title Pt to demo improved ROM for lumbar spine to have only mild deficit for all motions, to improve ability for ADLs. and IADLS.    Time 6    Period Weeks    Status Achieved      PT LONG TERM GOAL #5   Title Pt to demo improved cervical rotation on R to be equal to L or at least 55 deg, and flexion to be WNL.    Time 6    Period Weeks    Status Partially Met                 Plan - 03/26/20 1335    Clinical Impression Statement Pt educated on use of home traction unit, soft collar type w pump. Discussed head position and set up, pt will try at home. Pt with conitnued, mild soreness in R UT region, continues to benefit from manual and Dry needling.  Pt req mod cuing to perform ther ex correctly. Plan to review final HEp next visit for continued mobility and pain relief, likley d/c.    Personal Factors and Comorbidities Time since onset of injury/illness/exacerbation    Examination-Activity Limitations Bend;Sleep;Locomotion Level;Stand;Lift    Examination-Participation Restrictions Cleaning;Community Activity;Yard Work;Laundry;Driving    Stability/Clinical Decision Making Stable/Uncomplicated    Rehab Potential Fair    PT Frequency 2x / week    PT Duration 6 weeks    PT  Treatment/Interventions ADLs/Self Care Home Management;Cryotherapy;Electrical Stimulation;Gait training;DME Instruction;Ultrasound;Traction;Moist Heat;Iontophoresis 66m/ml Dexamethasone;Stair training;Functional mobility training;Therapeutic activities;Therapeutic exercise;Balance training;Neuromuscular re-education;Manual techniques;Patient/family education;Passive range of motion;Dry needling;Taping;Spinal Manipulations;Joint Manipulations    PT Home Exercise Plan [email or web address]    Consulted and Agree with Plan of Care Patient           Patient will benefit from skilled therapeutic intervention in order to improve the following deficits and impairments:  Abnormal gait, Difficulty walking, Decreased range of motion, Decreased coordination, Decreased endurance, Increased muscle spasms, Pain, Hypomobility, Decreased activity tolerance, Impaired flexibility, Improper body mechanics, Decreased strength, Decreased mobility  Visit Diagnosis: Chronic bilateral low back pain without sciatica  Radiculopathy, cervical region     Problem List Patient Active Problem List   Diagnosis Date Noted  . Chronic low back pain 02/11/2020  . Insomnia 02/11/2020  . Hyperglycemia 11/08/2019  . Vitamin D deficiency 11/08/2019  . Essential hypertension 11/08/2019  . Gastroesophageal reflux disease 11/08/2019  . Depression, major, single episode, moderate [place] 11/08/2019  . Cervical disc disease 11/08/2019  . Asthma, persistent controlled 11/08/2019  . [place] cancer [place] 11/08/[place]  . Status post cataract extraction 11/08/2019  . Dyslipidemia 11/08/2019    LLyndee Hensen PT, DPT 1:38 PM  03/26/20    [place] [address]Phone: [phone]  Fax:  [phone] Name: TBreylin DomMRN: [id number] of Birth: [birth year]

## 2020-03-27 ENCOUNTER — Other Ambulatory Visit: Payer: Self-pay

## 2020-03-27 ENCOUNTER — Ambulatory Visit (INDEPENDENT_AMBULATORY_CARE_PROVIDER_SITE_OTHER): Payer: Medicare Other | Admitting: Physical Therapy

## 2020-03-27 DIAGNOSIS — M545 Low back pain, unspecified: Secondary | ICD-10-CM

## 2020-03-27 DIAGNOSIS — M5412 Radiculopathy, cervical region: Secondary | ICD-10-CM

## 2020-03-27 DIAGNOSIS — G8929 Other chronic pain: Secondary | ICD-10-CM | POA: Diagnosis not present

## 2020-03-27 NOTE — Patient Instructions (Signed)
Access Code: [email or web address] URL: [email or web address] Date: 03/27/2020 Prepared by: Lyndee Hensen  Exercises Supine Single Knee to Chest Stretch - 2 x daily - 3 reps - 30 hold Supine Posterior Pelvic Tilt - 2 x daily - 2 sets - 10 reps Supine Lower Trunk Rotation - 2 x daily - 10 reps - 10 hold Seated Hamstring Stretch - 2 x daily - 3 reps - 30 hold Standing Hip Flexor Stretch - 2 x daily - 3 reps - 30 hold Supine March - 1 x daily - 2 sets - 10 reps Supine Bridge - 1 x daily - 2 sets - 10 reps Sidelying Hip Abduction - 1 x daily - 2 sets - 10 reps Standing Hip Abduction with Counter Support - 1 x daily - 2 sets - 10 reps Supine Single Arm Pectoralis Stretch - 2 x daily - 3 reps - 30 hold Supine Shoulder Flexion Extension AAROM with Dowel - 1 x daily - 1 sets - 10 reps Scapular Retraction with Resistance - 1 x daily - 2 sets - 10 reps

## 2020-03-29 ENCOUNTER — Encounter: Payer: Self-pay | Admitting: Physical Therapy

## 2020-03-29 NOTE — Therapy (Signed)
[facility] [address] [place], [place], [address] Phone: [phone]   Fax:  [phone]  Physical Therapy Treatment  Patient Details  Name: Austin [name] MRN: [id number] Date of Birth: Sep 30, 1941 Referring Provider (PT): Dimas Chyle   Encounter Date: 03/27/2020   PT End of Session - 03/29/20 1512    Visit Number 11    Number of Visits 12    Date for PT Re-Evaluation 04/01/20    Authorization Type Medicare    PT Start Time 1115    PT Stop Time 1150    PT Time Calculation (min) 35 min    Activity Tolerance Patient tolerated treatment well    Behavior During Therapy [facility] for tasks assessed/performed           Past Medical History:  Diagnosis Date  . Asthma   . Depression   . Hypertension   . Prostate cancer ([place])   . Urinary incontinence     Past Surgical History:  Procedure Laterality Date  . [place]  . REPLACEMENT TOTAL KNEE BILATERAL  2001   2007  . surgery replacement Bilateral 2005    There were no vitals filed for this visit.   Subjective Assessment - 03/29/20 1511    Subjective Pt states stiffness feeling in neck and back. Has been doing HEP. 15 min late to appt    Currently in Pain? Yes    Pain Score 2     Pain Location Neck    Pain Orientation Right    Pain Descriptors / Indicators Aching;Tightness    Pain Type Chronic pain    Pain Onset More than a month ago    Pain Frequency Intermittent    Pain Score 1    Pain Location Back    Pain Orientation Right;Left    Pain Descriptors / Indicators Tightness    Pain Type Chronic pain    Pain Onset More than a month ago    Pain Frequency Intermittent                             [facility] Adult PT Treatment/Exercise - 03/29/20 0001      Neck Exercises: Theraband   Rows Green;20 reps      Lumbar Exercises: Stretches   Active Hamstring Stretch 2 reps;30 seconds    Active Hamstring Stretch Limitations seated    Other  Lumbar Stretch Exercise Hip flexor stretch standing 30 sec x 3 bil;       Lumbar Exercises: Aerobic   Recumbent Bike --      Lumbar Exercises: Standing   Other Standing Lumbar Exercises Standing March x 20; HIp ad 2 x 10 bil;       Lumbar Exercises: Supine   Bent Knee Raise 20 reps    Bridge 20 reps      Manual Therapy   Joint Mobilization PA mobs c-spine gr 3     Soft tissue mobilization Bil Cervical paraspinals, UT, sub occipitals     Manual Traction 15 sec x 10 , cervical       Neck Exercises: Stretches   Chest Stretch 60 seconds    Chest Stretch Limitations supine pec stretch with wrist flex/ext for nerve glide x 20 ;                   PT Education - 03/29/20 1512    Education Details Final HEP reviewed    Person(s)  Educated Patient    Methods Explanation;Demonstration;Verbal cues;Handout    Comprehension Verbalized understanding;Returned demonstration;Tactile cues required            PT Short Term Goals - 03/26/20 1326      PT SHORT TERM GOAL #1   Title Pt to be independent with initial HEP    Time 2    Period Weeks    Status Achieved    Target Date 03/04/20             PT Long Term Goals - 03/29/20 1514      PT LONG TERM GOAL #1   Title Pt to demo improved pain in low back to 0-2/10 with activity and walking.    Time 6    Period Weeks    Status Achieved      PT LONG TERM GOAL #2   Title Pt to report decreased tinlging in Bil UEs to be rated 0-2/10 intermittent.    Time 6    Period Weeks    Status Achieved      PT LONG TERM GOAL #3   Title Pt to be independent with final HEP for neck and back    Time 6    Period Weeks    Status Achieved      PT LONG TERM GOAL #4   Title Pt to demo improved ROM for lumbar spine to have only mild deficit for all motions, to improve ability for ADLs. and IADLS.    Time 6    Period Weeks    Status Achieved      PT LONG TERM GOAL #5   Title Pt to demo improved cervical rotation on R to be equal to L or  at least 55 deg, and flexion to be WNL.    Time 6    Period Weeks    Status Achieved                 Plan - 03/29/20 1515    Clinical Impression Statement Pt has benefitted from manual therapy as well as education on Ther ex and HEP. Pt doing much better managing stiffness and mild soreness in neck and back. Final HEP reviewed today. Pt does have stiffness in c-spine that limits full ROM. He has had pain reduction in neck, back, as well as intensity and frequency of tinging in UEs, but has not continued to have significant pain resolution in last couple weeks. Pt has met goals at this time, doing well managing chronic pain. Ready for d/c to HEP, pt in agreement with plan.    Personal Factors and Comorbidities Time since onset of injury/illness/exacerbation    Examination-Activity Limitations Bend;Sleep;Locomotion Level;Stand;Lift    Examination-Participation Restrictions Cleaning;Community Activity;Yard Work;Laundry;Driving    Stability/Clinical Decision Making Stable/Uncomplicated    Rehab Potential Fair    PT Frequency 2x / week    PT Duration 6 weeks    PT Treatment/Interventions ADLs/Self Care Home Management;Cryotherapy;Electrical Stimulation;Gait training;DME Instruction;Ultrasound;Traction;Moist Heat;Iontophoresis '4mg'$ /ml Dexamethasone;Stair training;Functional mobility training;Therapeutic activities;Therapeutic exercise;Balance training;Neuromuscular re-education;Manual techniques;Patient/family education;Passive range of motion;Dry needling;Taping;Spinal Manipulations;Joint Manipulations    PT Home Exercise Plan [email or web address]    Consulted and Agree with Plan of Care Patient           Patient will benefit from skilled therapeutic intervention in order to improve the following deficits and impairments:  Abnormal gait, Difficulty walking, Decreased range of motion, Decreased coordination, Decreased endurance, Increased muscle spasms, Pain, Hypomobility, Decreased activity tolerance,  Impaired flexibility, Improper body mechanics, Decreased strength,  Decreased mobility  Visit Diagnosis: Chronic bilateral low back pain without sciatica  Radiculopathy, cervical region     Problem List Patient Active Problem List   Diagnosis Date Noted  . Chronic low back pain 02/11/2020  . Insomnia 02/11/2020  . Hyperglycemia 11/08/2019  . Vitamin D deficiency 11/08/2019  . Essential hypertension 11/08/2019  . Gastroesophageal reflux disease 11/08/2019  . Depression, major, single episode, moderate ([place]) 11/08/2019  . Cervical disc disease 11/08/2019  . Asthma, persistent controlled 11/08/2019  . Prostate cancer ([place]) 11/08/2019  . Status post cataract extraction 11/08/2019  . Dyslipidemia 11/08/2019    Lyndee Hensen, PT, DPT 3:17 PM  03/29/20    [facility] [place], [place], [address] Phone: [phone]   Fax:  [phone]  Name: Austin [name] MRN: [id number] Date of Birth: Sep 18, 1941

## 2020-04-21 ENCOUNTER — Encounter: Payer: Self-pay | Admitting: Pulmonary Disease

## 2020-04-21 ENCOUNTER — Other Ambulatory Visit: Payer: Self-pay

## 2020-04-21 ENCOUNTER — Ambulatory Visit (INDEPENDENT_AMBULATORY_CARE_PROVIDER_SITE_OTHER): Payer: Medicare Other | Admitting: Pulmonary Disease

## 2020-04-21 VITALS — BP 130/80 | HR 94 | Temp 97.3°F | Ht 67.0 in | Wt 201.2 lb

## 2020-04-21 DIAGNOSIS — J45998 Other asthma: Secondary | ICD-10-CM

## 2020-04-21 NOTE — Assessment & Plan Note (Signed)
Reviewed 2018 pulmonary function testing with patient Reviewed inhalers and how they are prescribed Reviewed with patient how inhalers work  Plan: Patient to become adherent to Symbicort 160, 1 Symbicort 160 runs how he will transition to Advair 230 Emphasized importance of ICS/LABA usage Patient should only be using albuterol nebulized meds after adherent use of ICS/LABA and if patient remains short of breath or wheezing

## 2020-04-21 NOTE — Patient Instructions (Addendum)
You were seen today by Lauraine Rinne, NP  for:  1. Asthma, persistent controlled  Finish up your stock of:   Continue Symbicort 160 >>> 2 puffs in the morning right when you wake up, rinse out your mouth after use, 12 hours later 2 puffs, rinse after use >>> Take this daily, no matter what >>> This is not a rescue inhaler   Then transition to:   Advair 230 HFA  >>> 2 puffs in the morning right when you wake up, rinse out your mouth after use, 12 hours later 2 puffs, rinse after use >>> Take this daily, no matter what >>> This is not a rescue inhaler    Note your daily symptoms > remember "red flags" for COPD:   >>>Increase in cough >>>increase in sputum production >>>increase in shortness of breath or activity  intolerance.   If you notice these symptoms, please call the office to be seen.    Okay to use your albuterol nebulized meds every 6-8 hours AS NEEDED for shortness of breath or wheezing    Follow Up:    Return in about 3 months (around 07/22/2020), or if symptoms worsen or fail to improve, for Follow up with Dr. Shearon Stalls.   Please do your part to reduce the spread of COVID-19:      Reduce your risk of any infection  and COVID19 by using the similar precautions used for avoiding the common cold or flu:  Marland Kitchen Wash your hands often with soap and warm water for at least 20 seconds.  If soap and water are not readily available, use an alcohol-based hand sanitizer with at least 60% alcohol.  . If coughing or sneezing, cover your mouth and nose by coughing or sneezing into the elbow areas of your shirt or coat, into a tissue or into your sleeve (not your hands). Langley Gauss A MASK when in public  . Avoid shaking hands with others and consider head nods or verbal greetings only. . Avoid touching your eyes, nose, or mouth with unwashed hands.  . Avoid close contact with people who are sick. . Avoid places or events with large numbers of people in one location, like concerts or  sporting events. . If you have some symptoms but not all symptoms, continue to monitor at home and seek medical attention if your symptoms worsen. . If you are having a medical emergency, call 911.   [place] / e-Visit: [email or web address]         [facility]: [facility]: [phone]                   [facility]: [phone]     It is flu season:   >>> Best ways to protect herself from the flu: Receive the yearly flu vaccine, practice good hand hygiene washing with soap and also using hand sanitizer when available, eat a nutritious meals, get adequate rest, hydrate appropriately   Please contact the office if your symptoms worsen or you have concerns that you are not improving.   Thank you for choosing [facility] for your healthcare, and for allowing [place] to partner with you on your healthcare journey. I am thankful to be able to provide care to you today.   Wyn Quaker FNP-C

## 2020-04-21 NOTE — Progress Notes (Signed)
@Patient  ID: Austin [name], male    DOB: [birth year], 79 y.o.   MRN: [id number]  Chief Complaint  Patient presents with  . Follow-up    Asthma/COPD, SOB, SOB in AM, better throughout the day    Referring provider: Vivi Barrack, MD  HPI:  79 year old male former smoker followed in our office for asthma COPD overlap syndrome  PMH: Vitamin D deficiency, GERD, hypertension, depression, history of prostate cancer, dyslipidemia, insomnia Smoker/ Smoking History: Former smoker Maintenance: Advair 230 Pt of: Dr. Shearon Stalls  04/21/2020  - Visit   79 year old male former smoker followed in our office for asthma COPD overlap syndrome.  Followed by Dr. Shearon Stalls.  Patient was instructed to start Advair 230 HFA.  He reports that he did not start this as he has so many albuterol nebs at home.  Has been maintained on this.  We will review this today.  01/17/20 - FENO - 40 >>>not fully adhearent to ICS/LABA   Patient reporting that overall he has been doing okay.  He has some shortness of breath in the morning.  He does admit though that he is not using his Symbicort 160 inhaler regularly.  He reports that he wants to finish out the Symbicort 160 that he has on hand before starting Advair 230 as previously instructed.  We will review this today.  Patient does feel that he is sometimes more winded especially with physical exertion.  We will review this today.  Questionaires / Pulmonary Flowsheets:   ACT:  Asthma Control Test ACT Total Score  01/21/2020 13    MMRC: mMRC Dyspnea Scale mMRC Score  12/14/2019 2    Epworth:  No flowsheet data found.  Tests:   01/17/2020-spirometry-FVC 2.1 (59% predicted), ratio 63, FEV1 1.3 (53% predicted)  09/14/2018-pulmonary function test-FVC 2.37 (59% addicted), postbronchodilator ratio 64, postbronchodilator FEV1 1.55 (54% predicted), positive bronchodilator response in FEV1, mid flow reversibility, DLCO 18.53 (59% predicted)  FENO:  Lab Results  Component Value  Date   NITRICOXIDE 40 01/17/2020    PFT: No flowsheet data found.  WALK:  No flowsheet data found.  Imaging: No results found.  Lab Results:  CBC    Component Value Date/Time   WBC 8.1 11/08/2019 0927   RBC 4.45 11/08/2019 0927   HGB 14.7 11/08/2019 0927   HCT 43.3 11/08/2019 0927   PLT 247.0 11/08/2019 0927   MCV 97.3 11/08/2019 0927   MCHC 34.0 11/08/2019 0927   RDW 14.0 11/08/2019 0927    BMET    Component Value Date/Time   NA 144 11/08/2019 0927   K 4.3 11/08/2019 0927   CL 109 11/08/2019 0927   CO2 29 11/08/2019 0927   GLUCOSE 128 (H) 11/08/2019 0927   BUN 18 11/08/2019 0927   CREATININE 0.63 11/08/2019 0927   CALCIUM 9.7 11/08/2019 0927    BNP No results found for: BNP  ProBNP No results found for: PROBNP  Specialty Problems      Pulmonary Problems   Asthma, persistent controlled      No Known Allergies  Immunization History  Administered Date(s) Administered  . Fluad Quad(high Dose 65+) 07/24/2019  . PFIZER SARS-COV-2 Vaccination 11/23/2019, 12/17/2019    Past Medical History:  Diagnosis Date  . Asthma   . Depression   . Hypertension   . Prostate cancer ([place])   . Urinary incontinence     Tobacco History: Social History   Tobacco Use  Smoking Status Former Smoker  . Types: Cigars  . Quit date:  10/22/2019  . Years since quitting: 0.4  Smokeless Tobacco Never Used  Tobacco Comment   smoked occ. with son. did not tolerate it long. 12/14/19.   Counseling given: Not Answered Comment: smoked occ. with son. did not tolerate it long. 12/14/19.   Continue to not smoke  Outpatient Encounter Medications as of 04/21/2020  Medication Sig  . albuterol (PROVENTIL) (2.5 MG/3ML) 0.083% nebulizer solution SMARTSIG:3 Milliliter(s) Via Nebulizer Every 4 Hours PRN  . albuterol (VENTOLIN HFA) 108 (90 Base) MCG/ACT inhaler Inhale 1-2 puffs into the lungs every 6 (six) hours as needed for wheezing or shortness of breath.  Marland Kitchen atorvastatin (LIPITOR)  80 MG tablet Take 1 tablet (80 mg total) by mouth daily.  . ciprofloxacin (CIPRO) 250 MG tablet Take 250 mg by mouth 2 (two) times daily.  . Cyanocobalamin (VITAMIN B 12 PO) Take 1,000 Units by mouth.  . escitalopram (LEXAPRO) 20 MG tablet Take 1 tablet (20 mg total) by mouth daily.  . fenofibrate micronized (LOFIBRA) 200 MG capsule Take 1 capsule (200 mg total) by mouth daily.  . fluticasone-salmeterol (ADVAIR HFA) 230-21 MCG/ACT inhaler Inhale 2 puffs into the lungs 2 (two) times daily.  Marland Kitchen losartan (COZAAR) 100 MG tablet Take 100 mg by mouth daily.  . magnesium oxide (MAG-OX) 400 MG tablet Take 1 tablet by mouth daily.  . metFORMIN (GLUCOPHAGE-XR) 750 MG 24 hr tablet TAKE 1 TABLET BY MOUTH EVERY DAY WITH BREAKFAST  . montelukast (SINGULAIR) 10 MG tablet Take 10 mg by mouth at bedtime.  Marland Kitchen omeprazole (PRILOSEC) 20 MG capsule Take 1 capsule (20 mg total) by mouth daily.  Marland Kitchen oxybutynin (DITROPAN-XL) 5 MG 24 hr tablet Take 5 mg by mouth daily.  . Vitamin D, Cholecalciferol, 50 MCG (2000 UT) CAPS Take by mouth.  . SYMBICORT 160-4.5 MCG/ACT inhaler Inhale 2 puffs into the lungs in the morning and at bedtime.   No facility-administered encounter medications on file as of 04/21/2020.     Review of Systems  Review of Systems  Constitutional: Negative for activity change, chills, fatigue, fever and unexpected weight change.  HENT: Negative for postnasal drip, rhinorrhea, sinus pressure, sinus pain and sore throat.   Eyes: Negative.   Respiratory: Positive for shortness of breath. Negative for cough and wheezing.   Cardiovascular: Negative for chest pain and palpitations.  Gastrointestinal: Negative for constipation, diarrhea, nausea and vomiting.  Endocrine: Negative.   Genitourinary: Negative.   Musculoskeletal: Negative.   Skin: Negative.   Neurological: Negative for dizziness and headaches.  Psychiatric/Behavioral: Negative.  Negative for dysphoric mood. The patient is not nervous/anxious.     All other systems reviewed and are negative.    Physical Exam  BP 130/80 (BP Location: Left Arm, Cuff Size: Normal)   Pulse 94   Temp (!) 97.3 F (36.3 C) (Oral)   Ht 5\' 7"  (1.702 m)   Wt 201 lb 3.2 oz (91.3 kg)   SpO2 95%   BMI 31.51 kg/m   Wt Readings from Last 5 Encounters:  04/21/20 201 lb 3.2 oz (91.3 kg)  02/11/20 198 lb 9.6 oz (90.1 kg)  01/21/20 195 lb 3.2 oz (88.5 kg)  12/14/19 195 lb 3.2 oz (88.5 kg)  11/22/19 191 lb 6.1 oz (86.8 kg)    BMI Readings from Last 5 Encounters:  04/21/20 31.51 kg/m  02/11/20 31.11 kg/m  01/21/20 30.57 kg/m  12/14/19 30.57 kg/m  11/22/19 29.97 kg/m     Physical Exam Vitals and nursing note reviewed.  Constitutional:  General: He is not in acute distress.    Appearance: Normal appearance. He is obese.  HENT:     Head: Normocephalic and atraumatic.     Right Ear: Hearing, tympanic membrane, ear canal and external ear normal. There is impacted cerumen (2/3).     Left Ear: Hearing, tympanic membrane, ear canal and external ear normal. There is no impacted cerumen.     Nose: Nose normal. No mucosal edema, congestion or rhinorrhea.     Right Turbinates: Not enlarged.     Left Turbinates: Not enlarged.     Mouth/Throat:     Mouth: Mucous membranes are dry.     Pharynx: Oropharynx is clear. No oropharyngeal exudate.  Eyes:     Pupils: Pupils are equal, round, and reactive to light.  Cardiovascular:     Rate and Rhythm: Normal rate and regular rhythm.     Pulses: Normal pulses.     Heart sounds: Normal heart sounds. No murmur heard.   Pulmonary:     Effort: Pulmonary effort is normal.     Breath sounds: Normal breath sounds. No decreased breath sounds, wheezing or rales.  Musculoskeletal:     Cervical back: Normal range of motion.     Right lower leg: No edema.     Left lower leg: No edema.  Lymphadenopathy:     Cervical: No cervical adenopathy.  Skin:    General: Skin is warm and dry.     Capillary Refill:  Capillary refill takes less than 2 seconds.     Findings: No erythema or rash.  Neurological:     General: No focal deficit present.     Mental Status: He is alert and oriented to person, place, and time.     Motor: No weakness.     Coordination: Coordination normal.     Gait: Gait is intact. Gait (Tolerated walk in office) normal.  Psychiatric:        Mood and Affect: Mood normal.        Behavior: Behavior normal. Behavior is cooperative.        Thought Content: Thought content normal.        Judgment: Judgment normal.       Assessment & Plan:   Asthma, persistent controlled Reviewed 2018 pulmonary function testing with patient Reviewed inhalers and how they are prescribed Reviewed with patient how inhalers work  Plan: Patient to become adherent to Symbicort 160, 1 Symbicort 160 runs how he will transition to Advair 230 Emphasized importance of ICS/LABA usage Patient should only be using albuterol nebulized meds after adherent use of ICS/LABA and if patient remains short of breath or wheezing    Return in about 3 months (around 07/22/2020), or if symptoms worsen or fail to improve, for Follow up with Dr. Shearon Stalls.   Lauraine Rinne, NP 04/21/2020   This appointment required 32 minutes of patient care (this includes precharting, chart review, review of results, face-to-face care, etc.).

## 2020-05-05 ENCOUNTER — Telehealth (INDEPENDENT_AMBULATORY_CARE_PROVIDER_SITE_OTHER): Payer: Medicare Other | Admitting: Family Medicine

## 2020-05-05 ENCOUNTER — Other Ambulatory Visit: Payer: Self-pay

## 2020-05-05 ENCOUNTER — Encounter: Payer: Self-pay | Admitting: Family Medicine

## 2020-05-05 VITALS — Ht 67.0 in | Wt 201.0 lb

## 2020-05-05 DIAGNOSIS — J45998 Other asthma: Secondary | ICD-10-CM | POA: Diagnosis not present

## 2020-05-05 MED ORDER — AZITHROMYCIN 250 MG PO TABS: ORAL_TABLET | ORAL | 0 refills | Status: DC

## 2020-05-05 MED ORDER — PREDNISONE 50 MG PO TABS: ORAL_TABLET | ORAL | 0 refills | Status: DC

## 2020-05-05 NOTE — Assessment & Plan Note (Signed)
With acute flare.  Likely due to viral URI.  Discussed Covid testing however we will defer for now.  Will treat with azithromycin and prednisone.  He will continue albuterol, Symbicort, and Advair.  Discussed reasons return to care.

## 2020-05-05 NOTE — Progress Notes (Signed)
   Austin [name] is a 79 y.o. male who presents today for a virtual office visit.  Assessment/Plan:  Chronic Problems Addressed Today: Asthma, persistent controlled With acute flare.  Likely due to viral URI.  Discussed Covid testing however we will defer for now.  Will treat with azithromycin and prednisone.  He will continue albuterol, Symbicort, and Advair.  Discussed reasons return to care.     Subjective:  HPI:  Symptoms started about 3 days ago. Symptoms include rhinorrhea, productive cough, wheeze, some slight fevers and chills. No sick contacts. Has used inhalers with some improvement. No reported shortness of bretah.        Objective/Observations  Physical Exam: Gen: NAD, resting comfortably Pulm: Normal work of breathing. Audible wheezes present.  Neuro: Grossly normal, moves all extremities Psych: Normal affect and thought content  Virtual Visit via Video   I connected with Austin [name] on 05/05/20 at  3:00 PM EDT by a video enabled telemedicine application and verified that I am speaking with the correct person using two identifiers. The limitations of evaluation and management by telemedicine and the availability of in person appointments were discussed. The patient expressed understanding and agreed to proceed.   Patient location: Home Provider location: [place] participating in the virtual visit: Myself and Patient     Algis Greenhouse. Jerline Pain, MD 05/05/2020 11:13 AM

## 2020-05-12 ENCOUNTER — Telehealth: Payer: Self-pay

## 2020-05-12 ENCOUNTER — Other Ambulatory Visit: Payer: Self-pay

## 2020-05-12 MED ORDER — PREDNISONE 50 MG PO TABS: ORAL_TABLET | ORAL | 0 refills | Status: DC

## 2020-05-12 MED ORDER — AZITHROMYCIN 250 MG PO TABS: ORAL_TABLET | ORAL | 0 refills | Status: DC

## 2020-05-12 NOTE — Telephone Encounter (Signed)
Medication has been sent to the patient's pharmacy.  

## 2020-05-12 NOTE — Telephone Encounter (Signed)
Pt called following up on Script.

## 2020-05-12 NOTE — Telephone Encounter (Signed)
Ok to send in one more course of prednisone and antibiotic.  Algis Greenhouse. Jerline Pain, MD 05/12/2020 10:39 AM

## 2020-05-12 NOTE — Telephone Encounter (Signed)
Pt is requesting another prescription of prednisone for his asthma. He states "after 1 more round , I should be good to go."

## 2020-06-19 ENCOUNTER — Other Ambulatory Visit: Payer: Self-pay | Admitting: Family Medicine

## 2020-07-12 ENCOUNTER — Other Ambulatory Visit: Payer: Self-pay | Admitting: Internal Medicine

## 2020-07-21 ENCOUNTER — Other Ambulatory Visit: Payer: Self-pay | Admitting: *Deleted

## 2020-07-21 MED ORDER — GLUCOSE BLOOD VI STRP: ORAL_STRIP | 12 refills | Status: DC

## 2020-07-29 ENCOUNTER — Telehealth: Payer: Self-pay | Admitting: Family Medicine

## 2020-07-29 NOTE — Telephone Encounter (Signed)
Left message for patient to call back and schedule Medicare Annual Wellness Visit (AWV) either virtually/audio only OR in office. Whatever the patients preference is.  No hx; please schedule at anytime with [facility]-Nurse Health Advisor at [facility].  This should be a 45 minute visit.  AWV-I PER [place] AS OF 11/11/16

## 2020-08-04 ENCOUNTER — Ambulatory Visit (INDEPENDENT_AMBULATORY_CARE_PROVIDER_SITE_OTHER): Payer: Medicare Other

## 2020-08-04 DIAGNOSIS — Z0000 Encounter for general adult medical examination without abnormal findings: Secondary | ICD-10-CM

## 2020-08-04 NOTE — Progress Notes (Signed)
Virtual Visit via Telephone Note  I connected with  Austin [name] on 08/04/20 at 10:15 AM EDT by telephone and verified that I am speaking with the correct person using two identifiers.  Medicare Annual Wellness visit completed telephonically due to Covid-19 pandemic.   Persons participating in this call: This Health Coach and this patient and wife [name]  Location: Patient: Home Provider: Office   I discussed the limitations, risks, security and privacy concerns of performing an evaluation and management service by telephone and the availability of in person appointments. The patient expressed understanding and agreed to proceed.  Unable to perform video visit due to video visit attempted and failed and/or patient does not have video capability.   Some vital signs may be absent or patient reported.   Willette Brace, LPN    Subjective:   Austin [name] is a 79 y.o. male who presents for an Initial Medicare Annual Wellness Visit.  Review of Systems     Cardiac Risk Factors include: hypertension;dyslipidemia;male gender;obesity (BMI >30kg/m2)     Objective:    Today's Vitals   08/04/20 1002  PainSc: 6    There is no height or weight on file to calculate BMI.  Advanced Directives 08/04/2020 02/19/2020  Does Patient Have a Medical Advance Directive? Yes No  Type of Paramedic of [place];Living will -  Copy of [place] in Chart? No - copy requested -  Would patient like information on creating a medical advance directive? - No - Patient declined    Current Medications (verified) Outpatient Encounter Medications as of 08/04/2020  Medication Sig  . albuterol (PROVENTIL) (2.5 MG/3ML) 0.083% nebulizer solution SMARTSIG:3 Milliliter(s) Via Nebulizer Every 4 Hours PRN  . albuterol (VENTOLIN HFA) 108 (90 Base) MCG/ACT inhaler INHALE 1-2 PUFFS INTO THE LUNGS EVERY 6 (SIX) HOURS AS NEEDED FOR WHEEZING OR SHORTNESS OF BREATH.  Marland Kitchen  atorvastatin (LIPITOR) 80 MG tablet Take 1 tablet (80 mg total) by mouth daily.  . Cyanocobalamin (VITAMIN B 12 PO) Take 1,000 Units by mouth.  . escitalopram (LEXAPRO) 20 MG tablet Take 1 tablet (20 mg total) by mouth daily.  . fenofibrate micronized (LOFIBRA) 200 MG capsule Take 1 capsule (200 mg total) by mouth daily.  . fluticasone-salmeterol (ADVAIR HFA) 230-21 MCG/ACT inhaler Inhale 2 puffs into the lungs 2 (two) times daily.  Marland Kitchen losartan (COZAAR) 100 MG tablet Take 100 mg by mouth daily.  . magnesium oxide (MAG-OX) 400 MG tablet Take 1 tablet by mouth daily.  . nitrofurantoin, macrocrystal-monohydrate, (MACROBID) 100 MG capsule Take 100 mg by mouth 2 (two) times daily.  Marland Kitchen omeprazole (PRILOSEC) 20 MG capsule TAKE 1 CAPSULE BY MOUTH EVERY DAY  . Vitamin D, Cholecalciferol, 50 MCG (2000 UT) CAPS Take by mouth.  Marland Kitchen glucose blood test strip Use as instructed (Patient not taking: Reported on 08/04/2020)  . metFORMIN (GLUCOPHAGE-XR) 750 MG 24 hr tablet TAKE 1 TABLET BY MOUTH EVERY DAY WITH BREAKFAST (Patient not taking: Reported on 08/04/2020)  . [DISCONTINUED] azithromycin (ZITHROMAX) 250 MG tablet Take 2 tabs day 1, then 1 tab daily (Patient not taking: Reported on 08/04/2020)  . [DISCONTINUED] montelukast (SINGULAIR) 10 MG tablet Take 10 mg by mouth at bedtime. (Patient not taking: Reported on 08/04/2020)  . [DISCONTINUED] oxybutynin (DITROPAN-XL) 5 MG 24 hr tablet Take 5 mg by mouth daily. (Patient not taking: Reported on 08/04/2020)  . [DISCONTINUED] predniSONE (DELTASONE) 50 MG tablet Take 1 tablet daily for 5 days. (Patient not taking: Reported on 08/04/2020)  . [  DISCONTINUED] SYMBICORT 160-4.5 MCG/ACT inhaler Inhale 2 puffs into the lungs in the morning and at bedtime. (Patient not taking: Reported on 08/04/2020)   No facility-administered encounter medications on file as of 08/04/2020.    Allergies (verified) Patient has no known allergies.   History: Past Medical History:  Diagnosis  Date  . Asthma   . Depression   . Hypertension   . Prostate cancer ([place])   . Urinary incontinence    Past Surgical History:  Procedure Laterality Date  . [place]  . REPLACEMENT TOTAL KNEE BILATERAL  2001   2007  . surgery replacement Bilateral 2005   Family History  Problem Relation Age of Onset  . Diabetes Brother   . Heart disease Paternal Grandfather   . Asthma Neg Hx    Social History   Socioeconomic History  . Marital status: Married    Spouse name: Not on file  . Number of children: Not on file  . Years of education: Not on file  . Highest education level: Not on file  Occupational History  . Occupation: retired  Tobacco Use  . Smoking status: Former Smoker    Types: Cigars    Quit date: 10/22/2019    Years since quitting: 0.7  . Smokeless tobacco: Never Used  . Tobacco comment: smoked occ. with son. did not tolerate it long. 12/14/19.  Vaping Use  . Vaping Use: Never used  Substance and Sexual Activity  . Alcohol use: Yes  . Drug use: Never  . Sexual activity: Not on file  Other Topics Concern  . Not on file  Social History Narrative  . Not on file   Social Determinants of Health   Financial Resource Strain: Low Risk   . Difficulty of Paying Living Expenses: Not hard at all  Food Insecurity: No Food Insecurity  . Worried About Charity fundraiser in the Last Year: Never true  . Ran Out of Food in the Last Year: Never true  Transportation Needs: No Transportation Needs  . Lack of Transportation (Medical): No  . Lack of Transportation (Non-Medical): No  Physical Activity: Unknown  . Days of Exercise per Week: Not on file  . Minutes of Exercise per Session: 30 min  Stress: Stress Concern Present  . Feeling of Stress : To some extent  Social Connections: Moderately Isolated  . Frequency of Communication with Friends and Family: Once a week  . Frequency of Social Gatherings with Friends and Family: Three times a week  . Attends  Religious Services: Never  . Active Member of Clubs or Organizations: No  . Attends Archivist Meetings: Never  . Marital Status: Married    Tobacco Counseling Counseling given: Not Answered Comment: smoked occ. with son. did not tolerate it long. 12/14/19.   Clinical Intake:  Pre-visit preparation completed: Yes  Pain : 0-10 (shoulders) Pain Score: 6  Pain Type: Chronic pain Pain Location: Shoulder Pain Orientation: Left, Right Pain Descriptors / Indicators: Aching, Numbness Pain Onset: More than a month ago Pain Frequency: Intermittent (more during the night)     BMI - recorded: 31.48 Nutritional Status: BMI > 30  Obese Nutritional Risks: None Diabetes: No  How often do you need to have someone help you when you read instructions, pamphlets, or other written materials from your doctor or pharmacy?: 1 - Never  Diabetic?No  Interpreter Needed?: No  Information entered by :: Charlott Rakes, LPN   Activities of Daily Living In your present state  of health, do you have any difficulty performing the following activities: 08/04/2020  Hearing? 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 N  Difficulty concentrating or making decisions? Y  Comment memory at times  Walking or climbing stairs? N  Dressing or bathing? N  Doing errands, shopping? N  Preparing Food and eating ? N  Using the Toilet? N  In the past six months, have you accidently leaked urine? Y  Comment urinary difficulty  Do you have problems with loss of bowel control? N  Managing your Medications? N  Managing your Finances? N  Housekeeping or managing your Housekeeping? N  Some recent data might be hidden    Patient Care Team: Vivi Barrack, MD as PCP - General (Family Medicine)  Indicate any recent Medical Services you may have received from other than [facility] providers in the past year (date may be approximate).     Assessment:   This is a routine wellness  examination for [facility].  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 Patient Stated     None at this time      Depression Screen PHQ 2/9 Scores 08/04/2020 05/05/2020 11/08/2019  PHQ - 2 Score 2 0 5  PHQ- 9 Score 4 0 18    Fall Risk Fall Risk  08/04/2020 11/08/2019  Falls in the past year? 0 0  Number falls in past yr: 0 -  Injury with Fall? 0 -  Risk for fall due to : Impaired vision -    Any stairs in or around the home? Yes  If so, are there any without handrails? No  Home free of loose throw rugs in walkways, pet beds, electrical cords, etc? Yes Adequate lighting in your home to reduce risk of falls? Yes  ASSISTIVE DEVICES UTILIZED TO PREVENT FALLS:  Life alert? No  Use of a cane, walker or w/c? No  Grab bars in the bathroom? Yes  Shower chair or bench in shower? Yes  Elevated toilet seat or a handicapped toilet? Yes   TIMED UP AND GO:  Was the test performed? No .    Cognitive Function:     6CIT Screen 08/04/2020  What Year? 0 points  What month? 0 points  Count back from 20 0 points  Months in reverse 4 points  Repeat phrase 2 points    Immunizations Immunization History  Administered Date(s) Administered  . Fluad Quad(high Dose 65+) 07/24/2019  . Influenza-Unspecified 06/04/2020  . PFIZER SARS-COV-2 Vaccination 11/23/2019, 12/17/2019    TDAP status: Due, Education has been provided regarding the importance of this vaccine. Advised may receive this vaccine at local pharmacy or Health Dept. Aware to provide a copy  of the vaccination record if obtained from local pharmacy or Health Dept. Verbalized acceptance and understanding.   Flu Vaccine: done 06/04/20 @ [facility]  Pneumococcal vaccine status: Declined,  Education has been provided regarding the importance of this vaccine but patient still declined.  Advised may receive this vaccine at local pharmacy or Health Dept. Aware to provide a copy of the vaccination record if obtained from local pharmacy or Health Dept. Verbalized acceptance and understanding.  Covid-19 vaccine status: Completed vaccines  Qualifies for Shingles Vaccine? Yes   Zostavax completed No   Shingrix Completed?: No.    Education has been provided regarding the importance of this vaccine. Patient has been advised to call insurance company to determine out of pocket expense if they have not yet received this vaccine. Advised may also receive vaccine at local pharmacy or Health Dept. Verbalized acceptance and understanding.  Screening Tests Health Maintenance  Topic Date Due  . Hepatitis C Screening  Never done  . TETANUS/TDAP  11/07/2020 (Originally 12/02/1959)  . PNA vac Low Risk Adult (1 of 2 - PCV13) 11/07/2020 (Originally 12/01/2005)  . INFLUENZA VACCINE  Completed  . COVID-19 Vaccine  Completed    Health Maintenance  Health Maintenance Due  Topic Date Due  . Hepatitis C Screening  Never done    Colorectal cancer screening: No longer required.    Additional Screening:  Hepatitis C Screening: does qualify;  Vision Screening: Recommended annual ophthalmology exams for early detection of glaucoma and other disorders of the eye. Is the patient up to date with their annual eye exam?  Yes  Who is the provider or what is the name of the office in which the patient attends annual eye exams? Dr Katy Fitch   Dental Screening: Recommended annual dental exams for proper oral hygiene  Community Resource Referral / Chronic Care Management: CRR required this visit?  No   CCM required this  visit?  No      Plan:     I have personally reviewed and noted the following in the patient's chart:   . Medical and social history . Use of alcohol, tobacco or illicit drugs  . Current medications and supplements . Functional ability and status . Nutritional status . Physical activity . Advanced directives . List of other physicians . Hospitalizations, surgeries, and ER visits in previous 12 months . Vitals . Screenings to include cognitive, depression, and falls . Referrals and appointments  In addition, I have reviewed and discussed with patient certain preventive protocols, quality metrics, and best practice recommendations. A written personalized care plan for preventive services as well as general preventive health recommendations were provided to patient.     Willette Brace, LPN   [id number]   Nurse Notes: None

## 2020-08-04 NOTE — Patient Instructions (Addendum)
Austin [name] , Thank you for taking time to come for your Medicare Wellness Visit. I appreciate your ongoing commitment to your health goals. Please review the following plan we discussed and let me know if I can assist you in the future.   Screening recommendations/referrals: Colonoscopy: No longer required Recommended yearly ophthalmology/optometry visit for glaucoma screening and checkup Recommended yearly dental visit for hygiene and checkup  Vaccinations: Influenza vaccine: Done 06/04/20 Pneumococcal vaccine: Postponed 11/07/20 Tdap vaccine: Postponed 11/07/20 Shingles vaccine: Shingrix discussed. Please contact your pharmacy for coverage information.    Covid-19: Completed 2/12 & 12/17/19  Advanced directives: Please bring a copy of your health care power of attorney and living will to the office at your convenience.  Conditions/risks identified: none at this time  Next appointment: Follow up in one year for your annual wellness visit.   Preventive Care 75 Years and Older, Male Preventive care refers to lifestyle choices and visits with your health care provider that can promote health and wellness. What does preventive care include?  A yearly physical exam. This is also called an annual well check.  Dental exams once or twice a year.  Routine eye exams. Ask your health care provider how often you should have your eyes checked.  Personal lifestyle choices, including:  Daily care of your teeth and gums.  Regular physical activity.  Eating a healthy diet.  Avoiding tobacco and drug use.  Limiting alcohol use.  Practicing safe sex.  Taking low doses of aspirin every day.  Taking vitamin and mineral supplements as recommended by your health care provider. What happens during an annual well check? The services and screenings done by your health care provider during your annual well check will depend on your age, overall health, lifestyle risk factors, and family history of  disease. Counseling  Your health care provider may ask you questions about your:  Alcohol use.  Tobacco use.  Drug use.  Emotional well-being.  Home and relationship well-being.  Sexual activity.  Eating habits.  History of falls.  Memory and ability to understand (cognition).  Work and work Statistician. Screening  You may have the following tests or measurements:  Height, weight, and BMI.  Blood pressure.  Lipid and cholesterol levels. These may be checked every 5 years, or more frequently if you are over 56 years old.  Skin check.  Lung cancer screening. You may have this screening every year starting at age 25 if you have a 30-pack-year history of smoking and currently smoke or have quit within the past 15 years.  Fecal occult blood test (FOBT) of the stool. You may have this test every year starting at age 79.  Flexible sigmoidoscopy or colonoscopy. You may have a sigmoidoscopy every 5 years or a colonoscopy every 10 years starting at age 61.  Prostate cancer screening. Recommendations will vary depending on your family history and other risks.  Hepatitis C blood test.  Hepatitis B blood test.  Sexually transmitted disease (STD) testing.  Diabetes screening. This is done by checking your blood sugar (glucose) after you have not eaten for a while (fasting). You may have this done every 1-3 years.  Abdominal aortic aneurysm (AAA) screening. You may need this if you are a current or former smoker.  Osteoporosis. You may be screened starting at age 73 if you are at high risk. Talk with your health care provider about your test results, treatment options, and if necessary, the need for more tests. Vaccines  Your health care  provider may recommend certain vaccines, such as:  Influenza vaccine. This is recommended every year.  Tetanus, diphtheria, and acellular pertussis (Tdap, Td) vaccine. You may need a Td booster every 10 years.  Zoster vaccine. You may  need this after age 60.  Pneumococcal 13-valent conjugate (PCV13) vaccine. One dose is recommended after age 1.  Pneumococcal polysaccharide (PPSV23) vaccine. One dose is recommended after age 37. Talk to your health care provider about which screenings and vaccines you need and how often you need them. This information is not intended to replace advice given to you by your health care provider. Make sure you discuss any questions you have with your health care provider. Document Released: 10/24/2015 Document Revised: 06/16/2016 Document Reviewed: 07/29/2015 Elsevier Interactive Patient Education  2017 [place] Prevention in the Home Falls can cause injuries. They can happen to people of all ages. There are many things you can do to make your home safe and to help prevent falls. What can I do on the outside of my home?  Regularly fix the edges of walkways and driveways and fix any cracks.  Remove anything that might make you trip as you walk through a door, such as a raised step or threshold.  Trim any bushes or trees on the path to your home.  Use bright outdoor lighting.  Clear any walking paths of anything that might make someone trip, such as rocks or tools.  Regularly check to see if handrails are loose or broken. Make sure that both sides of any steps have handrails.  Any raised decks and porches should have guardrails on the edges.  Have any leaves, snow, or ice cleared regularly.  Use sand or salt on walking paths during winter.  Clean up any spills in your garage right away. This includes oil or grease spills. What can I do in the bathroom?  Use night lights.  Install grab bars by the toilet and in the tub and shower. Do not use towel bars as grab bars.  Use non-skid mats or decals in the tub or shower.  If you need to sit down in the shower, use a plastic, non-slip stool.  Keep the floor dry. Clean up any water that spills on the floor as soon as it  happens.  Remove soap buildup in the tub or shower regularly.  Attach bath mats securely with double-sided non-slip rug tape.  Do not have throw rugs and other things on the floor that can make you trip. What can I do in the bedroom?  Use night lights.  Make sure that you have a light by your bed that is easy to reach.  Do not use any sheets or blankets that are too big for your bed. They should not hang down onto the floor.  Have a firm chair that has side arms. You can use this for support while you get dressed.  Do not have throw rugs and other things on the floor that can make you trip. What can I do in the kitchen?  Clean up any spills right away.  Avoid walking on wet floors.  Keep items that you use a lot in easy-to-reach places.  If you need to reach something above you, use a strong step stool that has a grab bar.  Keep electrical cords out of the way.  Do not use floor polish or wax that makes floors slippery. If you must use wax, use non-skid floor wax.  Do not have throw  rugs and other things on the floor that can make you trip. What can I do with my stairs?  Do not leave any items on the stairs.  Make sure that there are handrails on both sides of the stairs and use them. Fix handrails that are broken or loose. Make sure that handrails are as long as the stairways.  Check any carpeting to make sure that it is firmly attached to the stairs. Fix any carpet that is loose or worn.  Avoid having throw rugs at the top or bottom of the stairs. If you do have throw rugs, attach them to the floor with carpet tape.  Make sure that you have a light switch at the top of the stairs and the bottom of the stairs. If you do not have them, ask someone to add them for you. What else can I do to help prevent falls?  Wear shoes that:  Do not have high heels.  Have rubber bottoms.  Are comfortable and fit you well.  Are closed at the toe. Do not wear sandals.  If you  use a stepladder:  Make sure that it is fully opened. Do not climb a closed stepladder.  Make sure that both sides of the stepladder are locked into place.  Ask someone to hold it for you, if possible.  Clearly mark and make sure that you can see:  Any grab bars or handrails.  First and last steps.  Where the edge of each step is.  Use tools that help you move around (mobility aids) if they are needed. These include:  Canes.  Walkers.  Scooters.  Crutches.  Turn on the lights when you go into a dark area. Replace any light bulbs as soon as they burn out.  Set up your furniture so you have a clear path. Avoid moving your furniture around.  If any of your floors are uneven, fix them.  If there are any pets around you, be aware of where they are.  Review your medicines with your doctor. Some medicines can make you feel dizzy. This can increase your chance of falling. Ask your doctor what other things that you can do to help prevent falls. This information is not intended to replace advice given to you by your health care provider. Make sure you discuss any questions you have with your health care provider. Document Released: 07/24/2009 Document Revised: 03/04/2016 Document Reviewed: 11/01/2014 Elsevier Interactive Patient Education  2017 [employer].

## 2020-08-04 NOTE — Progress Notes (Signed)
I have personally reviewed the Medicare Annual Wellness Visit and agree with the documentation.  Algis Greenhouse. Jerline Pain, MD 08/04/2020 11:23 AM

## 2020-08-05 ENCOUNTER — Telehealth: Payer: Self-pay | Admitting: *Deleted

## 2020-08-05 NOTE — Telephone Encounter (Signed)
[facility] requesting refill Rx Ibuprofen 800mg  and Rx Clonazepam Pt has F/U appt on 08/13/2020

## 2020-08-05 NOTE — Telephone Encounter (Signed)
LVM pt need an OV for medicine refills   [facility] requesting refill Rx Ibuprofen 800mg  and Rx Clonazepam

## 2020-08-06 NOTE — Telephone Encounter (Signed)
I have never filled or discussed these meds with patient before. We can discuss further at his office visit.  Algis Greenhouse. Jerline Pain, MD 08/06/2020 9:52 AM

## 2020-08-07 NOTE — Telephone Encounter (Signed)
Pt aware has appointment on 08/13/2020

## 2020-08-08 ENCOUNTER — Other Ambulatory Visit: Payer: Self-pay | Admitting: *Deleted

## 2020-08-08 MED ORDER — GLUCOSE BLOOD VI STRP: ORAL_STRIP | 12 refills | Status: DC

## 2020-08-11 ENCOUNTER — Other Ambulatory Visit: Payer: Self-pay | Admitting: *Deleted

## 2020-08-11 MED ORDER — GLUCOSE BLOOD VI STRP: ORAL_STRIP | 12 refills | Status: DC

## 2020-08-13 ENCOUNTER — Other Ambulatory Visit: Payer: Self-pay

## 2020-08-13 ENCOUNTER — Ambulatory Visit (INDEPENDENT_AMBULATORY_CARE_PROVIDER_SITE_OTHER): Payer: Medicare Other | Admitting: Family Medicine

## 2020-08-13 ENCOUNTER — Encounter: Payer: Self-pay | Admitting: Family Medicine

## 2020-08-13 VITALS — BP 163/83 | HR 70 | Temp 97.8°F | Ht 67.0 in | Wt 200.2 lb

## 2020-08-13 DIAGNOSIS — E785 Hyperlipidemia, unspecified: Secondary | ICD-10-CM

## 2020-08-13 DIAGNOSIS — C61 Malignant neoplasm of prostate: Secondary | ICD-10-CM

## 2020-08-13 DIAGNOSIS — K219 Gastro-esophageal reflux disease without esophagitis: Secondary | ICD-10-CM

## 2020-08-13 DIAGNOSIS — R739 Hyperglycemia, unspecified: Secondary | ICD-10-CM

## 2020-08-13 DIAGNOSIS — I10 Essential (primary) hypertension: Secondary | ICD-10-CM | POA: Diagnosis not present

## 2020-08-13 DIAGNOSIS — G5602 Carpal tunnel syndrome, left upper limb: Secondary | ICD-10-CM

## 2020-08-13 MED ORDER — GLUCOSE BLOOD VI STRP: ORAL_STRIP | 12 refills | Status: DC

## 2020-08-13 MED ORDER — IBUPROFEN 800 MG PO TABS: 800.0000 mg | ORAL_TABLET | Freq: Three times a day (TID) | ORAL | 0 refills | Status: DC | PRN

## 2020-08-13 NOTE — Patient Instructions (Signed)
It was very nice to see you today!  We will check blood work today.  You have carpal tunnel syndrome.  Please work on exercises and use the wrist splints.  I will also place referral for you to see sports medicine.  I will see back in 6 months.  Please come back to see me sooner if needed.  The address for the sports medicine clinic is below:  [place], [place] [id number]  Take care, Dr Jerline Pain  Please try these tips to maintain a healthy lifestyle:   Eat at least 3 REAL meals and 1-2 snacks per day.  Aim for no more than 5 hours between eating.  If you eat breakfast, please do so within one hour of getting up.    Each meal should contain half fruits/vegetables, one quarter protein, and one quarter carbs (no bigger than a computer mouse)   Cut down on sweet beverages. This includes juice, soda, and sweet tea.     Drink at least 1 glass of water with each meal and aim for at least 8 glasses per day   Exercise at least 150 minutes every week.

## 2020-08-13 NOTE — Assessment & Plan Note (Signed)
Continue atorvastatin 80 mg daily and fenofibrate 200 mg daily.  Check lipid panel today.

## 2020-08-13 NOTE — Assessment & Plan Note (Signed)
On Metformin 750 mg once daily.  Will check A1c today.

## 2020-08-13 NOTE — Assessment & Plan Note (Signed)
-  Continue omeprazole 20 mg daily

## 2020-08-13 NOTE — Progress Notes (Signed)
   Austin [name] is a 79 y.o. male who presents today for an office visit.  Assessment/Plan:  New/Acute Problems: Carpal Tunnel Syndrome No red flags.  Discussed home exercises and cock-up wrist splint.  Will place referral to sports medicine for further evaluation per patient request.  Chronic Problems Addressed Today: Dyslipidemia Continue atorvastatin 80 mg daily and fenofibrate 200 mg daily.  Check lipid panel today.  Prostate cancer [facility]) Check PSA.  Gastroesophageal reflux disease Continue omeprazole 20 mg daily.  Essential hypertension Above goal though is previously been very well controlled.  Continue losartan milligrams daily.  Continue home monitoring goal 150/90 or lower.  Hyperglycemia On Metformin 750 mg once daily.  Will check A1c today.     Subjective:  HPI:  Patient here for follow-up.  Like to have blood work done today.  He is overall doing well.  Would like to have refill on ibuprofen today.  For the last several weeks he has noticed increasing left finger numbness.  Usually worse in the morning.  Sometimes wakes him up at night.  No obvious injuries or precipitating events.  No other weakness or numbness.  Has a history of bilateral shoulder repair and cervical degenerative disc disease.  Strength is normal.       Objective:  Physical Exam: BP (!) 163/83   Pulse 70   Temp 97.8 F (36.6 C) (Temporal)   Ht 5\' 7"  (1.702 m)   Wt 200 lb 3.2 oz (90.8 kg)   SpO2 97%   BMI 31.36 kg/m   Gen: No acute distress, resting comfortably CV: Regular rate and rhythm with no murmurs appreciated Pulm: Normal work of breathing, clear to auscultation bilaterally with no crackles, wheezes, or rhonchi MSK: Left hand without deformity.  Positive Tinel's sign at left wrist.  Neurovascular intact distally.  Strength 5 out of 5 throughout left upper extremity. Neuro: Grossly normal, moves all extremities Psych: Normal affect and thought content      Rashawn Rayman M. Jerline Pain,  MD 08/13/2020 10:52 AM

## 2020-08-13 NOTE — Assessment & Plan Note (Signed)
Above goal though is previously been very well controlled.  Continue losartan milligrams daily.  Continue home monitoring goal 150/90 or lower.

## 2020-08-13 NOTE — Assessment & Plan Note (Signed)
Check PSA. ?

## 2020-08-14 LAB — HEMOGLOBIN A1C
Hgb A1c MFr Bld: 6.3 % of total Hgb — ABNORMAL HIGH (ref <5.7)
Mean Plasma Glucose: 134 (calc)
eAG (mmol/L): 7.4 (calc)

## 2020-08-14 LAB — COMPREHENSIVE METABOLIC PANEL
AG Ratio: 1.5 (calc) (ref 1.0–2.5)
ALT: 20 U/L (ref 9–46)
AST: 18 U/L (ref 10–35)
Albumin: 4.1 g/dL (ref 3.6–5.1)
Alkaline phosphatase (APISO): 59 U/L (ref 35–144)
BUN/Creatinine Ratio: 17 (calc) (ref 6–22)
BUN: 11 mg/dL (ref 7–25)
CO2: 30 mmol/L (ref 20–32)
Calcium: 9.6 mg/dL (ref 8.6–10.3)
Chloride: 105 mmol/L (ref 98–110)
Creat: 0.65 mg/dL — ABNORMAL LOW (ref 0.70–1.18)
Globulin: 2.8 g/dL (calc) (ref 1.9–3.7)
Glucose, Bld: 113 mg/dL — ABNORMAL HIGH (ref 65–99)
Potassium: 4.4 mmol/L (ref 3.5–5.3)
Sodium: 144 mmol/L (ref 135–146)
Total Bilirubin: 0.5 mg/dL (ref 0.2–1.2)
Total Protein: 6.9 g/dL (ref 6.1–8.1)

## 2020-08-14 LAB — TSH: TSH: 1.88 mIU/L (ref 0.40–4.50)

## 2020-08-14 LAB — CBC
HCT: 44.1 % (ref 38.5–50.0)
Hemoglobin: 14.7 g/dL (ref 13.2–17.1)
MCH: 32.3 pg (ref 27.0–33.0)
MCHC: 33.3 g/dL (ref 32.0–36.0)
MCV: 96.9 fL (ref 80.0–100.0)
MPV: 10 fL (ref 7.5–12.5)
Platelets: 235 10*3/uL (ref 140–400)
RBC: 4.55 10*6/uL (ref 4.20–5.80)
RDW: 13 % (ref 11.0–15.0)
WBC: 5 10*3/uL (ref 3.8–10.8)

## 2020-08-14 LAB — LIPID PANEL
Cholesterol: 158 mg/dL (ref <200)
HDL: 50 mg/dL (ref >=40)
LDL Cholesterol (Calc): 84 mg/dL (calc)
Non-HDL Cholesterol (Calc): 108 mg/dL (calc) (ref <130)
Total CHOL/HDL Ratio: 3.2 (calc) (ref <5.0)
Triglycerides: 144 mg/dL (ref <150)

## 2020-08-14 LAB — PSA: PSA: 3.33 ng/mL (ref <=4.0)

## 2020-08-14 NOTE — Progress Notes (Signed)
I, Wendy Poet, LAT, ATC, am serving as scribe for Dr. Lynne Leader.   Subjective:    I'm seeing this patient as a consultation for Dr. Dimas Chyle. Note will be routed back to referring provider/PCP.  CC: L wrist pn/carpal tunnel syndrome  HPI: Pt is a 79 y/o male presenting c chronic L wrist pain/carpal tunnel syndrome x several years.  Pt was seen by his PCP 08/13/20 and advised on a HEP and to wear a cock-up wrist splint.  Today, pt presents c L wrist pn/carpal tunnel syndrome c no known MOI.  He locates his pain to his L hand and notes numbness/tingling in his L finger tips and into his L UE.  Swelling: yes in his L hand and forearm Numbness/tingling: yes in L fingers, all fingertips Decreased grip? No Aggravating factors: holding a book while trying to read a book; sidelying position at night Rx tried: Nothing in particular  Diagnostic imaging: C-spine XR- 11/20/19  Past medical history, Surgical history, Family history, Social history, Allergies, and medications have been entered into the medical record, reviewed.   Review of Systems: No new headache, visual changes, nausea, vomiting, diarrhea, constipation, dizziness, abdominal pain, skin rash, fevers, chills, night sweats, weight loss, swollen lymph nodes, body aches, joint swelling, muscle aches, chest pain, shortness of breath, mood changes, visual or auditory hallucinations.   Objective:    Vitals:   08/15/20 1007  BP: 130/72  Pulse: 78  SpO2: 94%   General: Well Developed, well nourished, and in no acute distress.  Neuro/Psych: Alert and oriented x3, extra-ocular muscles intact, able to move all 4 extremities, sensation grossly intact. Skin: Warm and dry, no rashes noted.  Respiratory: Not using accessory muscles, speaking in full sentences, trachea midline.  Cardiovascular: Pulses palpable, no extremity edema. Abdomen: Does not appear distended. MSK: C-spine normal-appearing nontender normal cervical motion Left  wrist normal-appearing Normal motion.  Positive Tinel's carpal tunnel. Normal grip strength. Minimally positive Phalen's test left  Right wrist normal-appearing Normal motion. Positive Tinel's carpal tunnel. Normal grip strength. Negative Phalen's test right  Lab and Radiology Results  Diagnostic Limited MSK Ultrasound of: Bilateral wrist carpal tunnel Left wrist carpal tunnel median nerve enlarged measuring 17.4 mm Right wrist carpal tunnel median nerve also enlarged measuring 16.9 mm Impression: Moderate carpal tunnel syndrome bilaterally   Impression and Recommendations:    Assessment and Plan: 79 y.o. male with paresthesias bilateral hands left worse than right.  Distribution of paresthesia consistent with carpal tunnel syndrome.  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  He has not had much treatment yet for carpal tunnel syndrome.  He was advised to start wrist splinting at night which she has not yet.  Reinforced this recommendation.  Also will prescribe gabapentin.  Discussed possibility of injection.  We both agree that it is reasonable to give the night splint a chance and if not better in the next few weeks proceed with trial of injection.  Recheck as needed.Marland Kitchen  PDMP not reviewed this encounter. Orders Placed This Encounter  Procedures  . [place] LIMITED JOINT SPACE STRUCTURES UP LEFT(NO LINKED CHARGES)    Order Specific Question:   Reason for Exam (SYMPTOM  OR DIAGNOSIS REQUIRED)    Answer:   L wrist pain    Order Specific Question:   Preferred imaging location?    Answer:   [place]   Meds ordered this encounter  Medications  .  gabapentin (NEURONTIN) 100 MG capsule    Sig: Take 1-3 capsules (100-300 mg total) by mouth at bedtime. For nerve pain or tingling    Dispense:  90 capsule    Refill:  3    Discussed warning signs or symptoms. Please see discharge instructions. Patient  expresses understanding.   The above documentation has been reviewed and is accurate and complete Lynne Leader, M.D.

## 2020-08-14 NOTE — Progress Notes (Signed)
Please inform patient of the following:  A1c is up a bit but stable. Everything else is stable. Would like for him to keep with current treatment plan. We can recheck a1c in 6 months.  Algis Greenhouse. Jerline Pain, MD 08/14/2020 4:10 PM

## 2020-08-15 ENCOUNTER — Ambulatory Visit (INDEPENDENT_AMBULATORY_CARE_PROVIDER_SITE_OTHER): Payer: Medicare Other | Admitting: Family Medicine

## 2020-08-15 ENCOUNTER — Encounter: Payer: Self-pay | Admitting: Family Medicine

## 2020-08-15 ENCOUNTER — Other Ambulatory Visit: Payer: Self-pay

## 2020-08-15 ENCOUNTER — Ambulatory Visit: Payer: Self-pay

## 2020-08-15 VITALS — BP 130/72 | HR 78 | Ht 67.0 in | Wt 201.6 lb

## 2020-08-15 DIAGNOSIS — M25532 Pain in left wrist: Secondary | ICD-10-CM

## 2020-08-15 MED ORDER — GABAPENTIN 100 MG PO CAPS
100.0000 mg | ORAL_CAPSULE | Freq: Every day | ORAL | 3 refills | Status: DC
Start: 2020-08-15 — End: 2020-12-15

## 2020-08-15 NOTE — Patient Instructions (Signed)
Thank you for coming in today.  Try the wrist brace especially at bedtime.    Try the gabapentin at bedtime as needed for nerve pain or tingling.   If not improving in a few weeks schedule for injection.

## 2020-09-09 ENCOUNTER — Other Ambulatory Visit: Payer: Self-pay | Admitting: Family Medicine

## 2020-09-11 ENCOUNTER — Other Ambulatory Visit: Payer: Self-pay | Admitting: Family Medicine

## 2020-09-11 NOTE — Progress Notes (Signed)
I, Peterson Lombard, LAT, ATC acting as a scribe for Lynne Leader, MD.  Austin [name] is a 79 y.o. male who presents to [place] at [facility] today for f/u of chronic L wrist pain/carpal tunnel syndrome x several years w/ no know MOI. Pt locates pain to his L hand and notes numbness/tingling in his L finger tips. Pt was last seen by Dr. Georgina Snell on 08/15/20 and was advised wear night split and check back if not improving for a steroid injection. Today, pt reports numbness in all fingers. Pt is compliant w/ wearing splint. Pt thinks holding on to treadmill handle bars when at the gym is exacerbating injury.  Swelling: yes in his L hand and forearm Numbness/tingling: yes in L fingers, all fingertips Decreased grip: No Aggravating factors: holding a book while trying to read a book; sidelying position at night,  Rx tried: splint  Pertinent review of systems: No fevers or chills  Relevant historical information: Hypertension, prostate cancer, depression   Exam:  BP 118/78 (BP Location: Right Arm, Patient Position: Sitting, Cuff Size: Normal)   Pulse 68   Ht 5\' 7"  (1.702 m)   Wt 201 lb 12.8 oz (91.5 kg)   SpO2 98%   BMI 31.61 kg/m  General: Well Developed, well nourished, and in no acute distress.   MSK: Left wrist and hand normal-appearing nontender positive Tinel's carpal tunnel    Lab and Radiology Results  Procedure: Real-time Ultrasound Guided hydrodissection of the left median nerve at carpal tunnel   Device: Philips Affiniti 50G Images permanently stored and available for review in PACS Verbal informed consent obtained.  Discussed risks and benefits of procedure. Warned about infection bleeding damage to structures skin hypopigmentation and fat atrophy among others. Patient expresses understanding and agreement Time-out conducted.   Noted no overlying erythema, induration, or other signs of local infection.   Skin prepped in a sterile fashion.   Local  anesthesia: Topical Ethyl chloride.   With sterile technique and under real time ultrasound guidance:  40 mg of Kenalog and 1 mL of Marcaine injected into carpal tunnel surrounding median nerve. Fluid seen entering the tunnel.   Completed without difficulty   Pain immediately resolved suggesting accurate placement of the medication.   Advised to call if fevers/chills, erythema, induration, drainage, or persistent bleeding.   Images permanently stored and available for review in the ultrasound unit.  Impression: Technically successful ultrasound guided injection.         Assessment and Plan: 79 y.o. male with bilateral carpal tunnel syndrome left worse than right.  Symptoms did not improve and left hand with 1 month of trial of conservative management.  After discussion plan for trial of injection/median nerve hydrodissection.  Continue wrist splint.  If this helps quite a bit reasonable to proceed with right-sided injection as early as next week.  If no benefit would recommend nerve conduction study for surgical planning.   PDMP not reviewed this encounter. Orders Placed This Encounter  Procedures  . [place] LIMITED JOINT SPACE STRUCTURES UP LEFT(NO LINKED CHARGES)    Standing Status:   Future    Number of Occurrences:   1    Standing Expiration Date:   03/13/2021    Order Specific Question:   Reason for Exam (SYMPTOM  OR DIAGNOSIS REQUIRED)    Answer:   chronic left wrist pain    Order Specific Question:   Preferred imaging location?    Answer:   [place]  No orders of the defined types were placed in this encounter.    Discussed warning signs or symptoms. Please see discharge instructions. Patient expresses understanding.   The above documentation has been reviewed and is accurate and complete Lynne Leader, M.D.

## 2020-09-12 ENCOUNTER — Ambulatory Visit (INDEPENDENT_AMBULATORY_CARE_PROVIDER_SITE_OTHER): Payer: Medicare Other | Admitting: Family Medicine

## 2020-09-12 ENCOUNTER — Ambulatory Visit: Payer: Self-pay

## 2020-09-12 ENCOUNTER — Other Ambulatory Visit: Payer: Self-pay | Admitting: *Deleted

## 2020-09-12 ENCOUNTER — Other Ambulatory Visit: Payer: Self-pay

## 2020-09-12 VITALS — BP 118/78 | HR 68 | Ht 67.0 in | Wt 201.8 lb

## 2020-09-12 DIAGNOSIS — G5600 Carpal tunnel syndrome, unspecified upper limb: Secondary | ICD-10-CM | POA: Insufficient documentation

## 2020-09-12 DIAGNOSIS — G5603 Carpal tunnel syndrome, bilateral upper limbs: Secondary | ICD-10-CM | POA: Diagnosis not present

## 2020-09-12 DIAGNOSIS — M25532 Pain in left wrist: Secondary | ICD-10-CM | POA: Diagnosis not present

## 2020-09-12 MED ORDER — UNISTIK 2 EXTRA MISC: 0 refills | Status: DC

## 2020-09-12 NOTE — Patient Instructions (Signed)
Thank you for coming in today.  Call or go to the ER if you develop a large red swollen joint with extreme pain or oozing puss.   Continue the splint.   Recheck as needed.  Could inject the right side next week.

## 2020-09-15 ENCOUNTER — Other Ambulatory Visit: Payer: Self-pay | Admitting: *Deleted

## 2020-09-15 MED ORDER — UNISTIK 2 EXTRA MISC: 0 refills | Status: DC

## 2020-09-17 NOTE — Progress Notes (Signed)
   I, Wendy Poet, LAT, ATC, am serving as scribe for Dr. Lynne Leader.  Cathy Ropp is a 79 y.o. male who presents to [place] at [facility] today for f/u of B wrist/hand pain and would like to get a R carpal tunnel injection.  He was last seen by Dr. Georgina Snell on 09/12/20 and had a L median nerve/carpal tunnel injection.  He was advised to con't using the night splint and to return to clinic if he wanted a similar injection on the R side.  Since his last visit, pt reports same type of wrist pain in R wrist. Numbness/tingling in fingers. Pt c/o pain waking him up at night as he sleeps on his R side.   L wrist: Pt reports some relief from prior steroid injection on 09/12/20. Pt c/o continued numbness in 1st through 3rd and 5th fingers. Pt has   Pertinent review of systems: No fevers or chills  Relevant historical information: Hypertension   Exam:  BP (!) 148/92   Pulse 76   Ht 5\' 7"  (1.702 m)   Wt 200 lb 9.6 oz (91 kg)   SpO2 95%   BMI 31.42 kg/m  General: Well Developed, well nourished, and in no acute distress.   MSK: Hands bilaterally normal muscle bulk.  Normal wrist motion.    Lab and Radiology Results Procedure: Real-time Ultrasound Guided hydrodissection right median nerve at carpal tunnel Device: Philips Affiniti 50G Images permanently stored and available for review in PACS Verbal informed consent obtained.  Discussed risks and benefits of procedure. Warned about infection bleeding damage to structures skin hypopigmentation and fat atrophy among others. Patient expresses understanding and agreement Time-out conducted.   Noted no overlying erythema, induration, or other signs of local infection.   Skin prepped in a sterile fashion.   Local anesthesia: Topical Ethyl chloride.   With sterile technique and under real time ultrasound guidance:  40 mg of Kenalog and 1 mL of lidocaine injected into carpal tunnel surrounding median nerve. Fluid seen entering the  carpal tunnel.   Completed without difficulty   Pain immediately resolved suggesting accurate placement of the medication.   Advised to call if fevers/chills, erythema, induration, drainage, or persistent bleeding.   Images permanently stored and available for review in the ultrasound unit.  Impression: Technically successful ultrasound guided injection.         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  Left worse than right.  Plan to proceed with right carpal tunnel injection today.  Plan to also prescribed a right wrist brace as well.  Patient did not have much benefit in 1 week after left injection.  Plan to give it another week.  If not better patient will notify clinic and I will proceed with nerve conduction study.   PDMP not reviewed this encounter. Orders Placed This Encounter  Procedures  . [place] LIMITED JOINT SPACE STRUCTURES UP RIGHT(NO LINKED CHARGES)    Order Specific Question:   Reason for Exam (SYMPTOM  OR DIAGNOSIS REQUIRED)    Answer:   R wrist pain    Order Specific Question:   Preferred imaging location?    Answer:   [place]   No orders of the defined types were placed in this encounter.    Discussed warning signs or symptoms. Please see discharge instructions. Patient expresses understanding.   The above documentation has been reviewed and is accurate and complete Lynne Leader, M.D.

## 2020-09-18 ENCOUNTER — Other Ambulatory Visit: Payer: Self-pay

## 2020-09-18 ENCOUNTER — Ambulatory Visit: Payer: Self-pay

## 2020-09-18 ENCOUNTER — Ambulatory Visit (INDEPENDENT_AMBULATORY_CARE_PROVIDER_SITE_OTHER): Payer: Medicare Other | Admitting: Family Medicine

## 2020-09-18 VITALS — BP 148/92 | HR 76 | Ht 67.0 in | Wt 200.6 lb

## 2020-09-18 DIAGNOSIS — M25531 Pain in right wrist: Secondary | ICD-10-CM

## 2020-09-18 DIAGNOSIS — G5603 Carpal tunnel syndrome, bilateral upper limbs: Secondary | ICD-10-CM

## 2020-09-18 NOTE — Patient Instructions (Signed)
Thank you for coming in today.  Call or go to the ER if you develop a large red swollen joint with extreme pain or oozing puss.   If not better following injections let me know.  We can proceed to nerve conduction studies.

## 2020-09-23 ENCOUNTER — Other Ambulatory Visit: Payer: Self-pay | Admitting: Family Medicine

## 2020-11-03 NOTE — Progress Notes (Signed)
   I, Wendy Poet, LAT, ATC, am serving as scribe for Dr. Lynne Leader.  Austin [name] is a 80 y.o. male who presents to [place] at [facility] today for f/u of B carpal tunnel syndrome.  He was last seen by Dr. Georgina Snell on 09/18/20 for a R wrist/median nerve injection and prior to that on 09/12/20 for the L side.  He was advised to con't to use night wrist splints.  Since his last visit, pt reports the pain and numbness isn't as "deep" as it was prior. L 3rd finger is constantly tingly. Pt wore night splints for 2.5 weeks and then stopped to see if pain resumed.    Pertinent review of systems: No fevers or chills  Relevant historical information: Hypertension, prostate cancer, depression   Exam:  BP 140/88 (BP Location: Right Arm, Patient Position: Sitting, Cuff Size: Normal)   Pulse 96   Ht 5\' 7"  (1.702 m)   Wt 202 lb 3.2 oz (91.7 kg)   SpO2 94%   BMI 31.67 kg/m  General: Well Developed, well nourished, and in no acute distress.   MSK: Right wrist and hand slight thenar atrophy.  Normal wrist and hand motion.  Normal grip strength.  Positive Tinel's carpal tunnel. Left wrist and hand normal.  Normal grip strength and sensation.  Positive Tinel's.      Assessment and Plan: 80 y.o. male with lateral carpal tunnel syndrome.  Patient had bilateral carpal tunnel injections in early December 2021.  He had improved symptoms following injections but not great resolution.  I am concerned that he probably will have rapid return of bothersome severe symptoms again.  We will go ahead and proceed with nerve conduction study to further characterize severity of carpal tunnel syndrome for potential surgical planning.  Recheck following nerve conduction study anticipate this will be in a month or longer.   PDMP not reviewed this encounter. Orders Placed This Encounter  Procedures  . Ambulatory referral to Neurology    Referral Priority:   Routine    Referral Type:    Consultation    Referral Reason:   Specialty Services Required    Requested Specialty:   Neurology    Number of Visits Requested:   1  . NCV with EMG(electromyography)    Standing Status:   Future    Standing Expiration Date:   11/04/2021    Order Specific Question:   Where should this test be performed?    Answer:   [facility]   No orders of the defined types were placed in this encounter.    Discussed warning signs or symptoms. Please see discharge instructions. Patient expresses understanding.   The above documentation has been reviewed and is accurate and complete Lynne Leader, M.D.

## 2020-11-04 ENCOUNTER — Telehealth: Payer: Self-pay | Admitting: *Deleted

## 2020-11-04 ENCOUNTER — Ambulatory Visit (INDEPENDENT_AMBULATORY_CARE_PROVIDER_SITE_OTHER): Payer: Medicare Other | Admitting: Family Medicine

## 2020-11-04 ENCOUNTER — Other Ambulatory Visit: Payer: Self-pay

## 2020-11-04 VITALS — BP 140/88 | HR 96 | Ht 67.0 in | Wt 202.2 lb

## 2020-11-04 DIAGNOSIS — G5603 Carpal tunnel syndrome, bilateral upper limbs: Secondary | ICD-10-CM

## 2020-11-04 DIAGNOSIS — M25532 Pain in left wrist: Secondary | ICD-10-CM | POA: Diagnosis not present

## 2020-11-04 NOTE — Patient Instructions (Signed)
Thank you for coming in today.  Plan for nerve study.   Please let me know if you have trouble scheduling the nerve study in a reasonable amount of time (usually about a month).   Recheck with me after the results of the nerve study are back.    Electromyoneurogram Electromyoneurogram is a test to check how well your muscles and nerves are working. This procedure includes the combined use of electromyogram (EMG) and nerve conduction study (NCS). EMG is used to look for muscular disorders. NCS, which is also called electroneurogram, measures how well your nerves are controlling your muscles. The procedures are usually done together to check if your muscles and nerves are healthy. If the results of the tests are abnormal, this may indicate disease or injury, such as a neuromuscular disease or peripheral nerve damage. Tell a health care provider about:  Any allergies you have.  All medicines you are taking, including vitamins, herbs, eye drops, creams, and over-the-counter medicines.  Any problems you or family members have had with anesthetic medicines.  Any blood disorders you have.  Any surgeries you have had.  Any medical conditions you have.  If you have a pacemaker.  Whether you are pregnant or may be pregnant. What are the risks? Generally, this is a safe procedure. However, problems may occur, including:  Infection where the electrodes were inserted.  Bleeding. What happens before the procedure? Medicines Ask your health care provider about:  Changing or stopping your regular medicines. This is especially important if you are taking diabetes medicines or blood thinners.  Taking medicines such as aspirin and ibuprofen. These medicines can thin your blood. Do not take these medicines unless your health care provider tells you to take them.  Taking over-the-counter medicines, vitamins, herbs, and supplements. General instructions  Your health care provider may ask you  to avoid: ? Beverages that have caffeine, such as coffee and tea. ? Any products that contain nicotine or tobacco. These products include cigarettes, e-cigarettes, and chewing tobacco. If you need help quitting, ask your health care provider.  Do not use lotions or creams on the same day that you will be having the procedure. What happens during the procedure? For EMG  Your health care provider will ask you to stay in a position so that he or she can access the muscle that will be studied. You may be standing, sitting, or lying down.  You may be given a medicine that numbs the area (local anesthetic).  A very thin needle that has an electrode will be inserted into your muscle.  Another small electrode will be placed on your skin near the muscle.  Your health care provider will ask you to continue to remain still.  The electrodes will send a signal that tells about the electrical activity of your muscles. You may see this on a monitor or hear it in the room.  After your muscles have been studied at rest, your health care provider will ask you to contract or flex your muscles. The electrodes will send a signal that tells about the electrical activity of your muscles.  Your health care provider will remove the electrodes and the electrode needles when the procedure is finished. The procedure may vary among health care providers and hospitals.   For NCS  An electrode that records your nerve activity (recording electrode) will be placed on your skin by the muscle that is being studied.  An electrode that is used as a reference (reference electrode)  will be placed near the recording electrode.  A paste or gel will be applied to your skin between the recording electrode and the reference electrode.  Your nerve will be stimulated with a mild shock. Your health care provider will measure how much time it takes for your muscle to react.  Your health care provider will remove the electrodes  and the gel when the procedure is finished. The procedure may vary among health care providers and hospitals.   What happens after the procedure?  It is up to you to get the results of your procedure. Ask your health care provider, or the department that is doing the procedure, when your results will be ready.  Your health care provider may: ? Give you medicines for any pain. ? Monitor the insertion sites to make sure that bleeding stops. Summary  Electromyoneurogram is a test to check how well your muscles and nerves are working.  If the results of the tests are abnormal, this may indicate disease or injury.  This is a safe procedure. However, problems may occur, such as bleeding and infection.  Your health care provider will do two tests to complete this procedure. One checks your muscles (EMG) and another checks your nerves (NCS).  It is up to you to get the results of your procedure. Ask your health care provider, or the department that is doing the procedure, when your results will be ready. This information is not intended to replace advice given to you by your health care provider. Make sure you discuss any questions you have with your health care provider. Document Revised: 06/13/2018 Document Reviewed: 05/26/2018 Elsevier Patient Education  [place].

## 2020-11-04 NOTE — Telephone Encounter (Signed)
Pt called & stated that he would like to skip the nerve study and just be referred to surgery.

## 2020-11-05 NOTE — Telephone Encounter (Signed)
Called pt and relayed your message.  Pt states that he is fine w/ proceeding w/ the EMG/NCV test first and then going to hand surgery after.  Please proceed w/ original plan.

## 2020-11-05 NOTE — Telephone Encounter (Signed)
The hand surgeon is very likely going to need a nerve conduction study to proceed with surgery.  I am happy to just refer you to the hand surgeon if you would like but generally they want to have the nerve conduction study done first.  What would you like to do?

## 2020-11-10 ENCOUNTER — Encounter: Payer: Self-pay | Admitting: Neurology

## 2020-11-28 ENCOUNTER — Other Ambulatory Visit: Payer: Self-pay | Admitting: Internal Medicine

## 2020-12-10 ENCOUNTER — Ambulatory Visit (INDEPENDENT_AMBULATORY_CARE_PROVIDER_SITE_OTHER): Payer: Medicare Other | Admitting: Neurology

## 2020-12-10 ENCOUNTER — Other Ambulatory Visit: Payer: Self-pay

## 2020-12-10 DIAGNOSIS — G5620 Lesion of ulnar nerve, unspecified upper limb: Secondary | ICD-10-CM

## 2020-12-10 DIAGNOSIS — G5603 Carpal tunnel syndrome, bilateral upper limbs: Secondary | ICD-10-CM | POA: Diagnosis not present

## 2020-12-10 DIAGNOSIS — M5412 Radiculopathy, cervical region: Secondary | ICD-10-CM

## 2020-12-10 NOTE — Procedures (Signed)
[facility]  [place], [address]  [place], [place] [address] Tel: [phone] Fax:  [phone] Test Date:  12/10/2020  Patient: Austin [name] DOB: 1941-01-11 Physician: Narda Amber, DO  Sex: Male Height: 5\' 7"  Ref Phys: Michiel Cowboy, MD  ID#: [id number]   Technician:    Patient Complaints: This is a 80 year old man referred for evaluation of bilateral hand paresthesias.  NCV & EMG Findings: Extensive electrodiagnostic testing of the right upper extremity and additional studies of the left shows:  1. Median sensory response shows prolonged latency (5.8 ms) and reduced amplitude (5.6 V) on the right and absent response on the left. Bilateral ulnar sensory responses show prolonged latency (R4.1, L4.1 ms). 2. Bilateral median motor responses showlatency (L7.6, R6.5 ms) and reduced amplitude (L2.6, R3.9 mV). Bilateral ulnar motor responses show prolonged latency (L3.6, R4.0 ms) and decreased conduction velocity (A Elbow-B Elbow, L45, R28 m/s).  3. 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 There is no evidence of accompanied active denervation.   Impression: 1. Bilateral median neuropathy at or distal to the wrist, consistent with a clinical diagnosis of carpal tunnel syndrome.  Overall, these findings are severe in degree electrically and worse on the left. 2. Bilateral ulnar neuropathy with slowing across the elbow, moderate and worse on the right. 3. 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   ___________________________ Narda Amber, DO    Nerve Conduction Studies Anti Sensory Summary Table   Stim Site NR Peak (ms) Norm Peak (ms) P-T Amp (V) Norm P-T Amp  Left Median Anti Sensory (2nd Digit)  32C  Wrist NR  <3.8  >10  Right Median Anti Sensory (2nd Digit)  32C  Wrist    5.8 <3.8 5.6 >10  Left Ulnar Anti Sensory (5th  Digit)  32C  Wrist    4.1 <3.2 7.6 >5  Right Ulnar Anti Sensory (5th Digit)  32C  Wrist    4.1 <3.2 6.5 >5   Motor Summary Table   Stim Site NR Onset (ms) Norm Onset (ms) O-P Amp (mV) Norm O-P Amp Site1 Site2 Delta-0 (ms) Dist (cm) Vel (m/s) Norm Vel (m/s)  Left Median Motor (Abd Poll Brev)  32C  Wrist    7.6 <4.0 2.6 >5 Elbow Wrist 6.5 34.0 52 >50  Elbow    14.1  2.5         Right Median Motor (Abd Poll Brev)  32C  Wrist    6.5 <4.0 3.9 >5 Elbow Wrist 5.6 28.0 50 >50  Elbow    12.1  3.5         Left Ulnar Motor (Abd Dig Minimi)  32C  Wrist    3.6 <3.1 10.1 >7 B Elbow Wrist 4.0 22.0 55 >50  B Elbow    7.6  9.1  A Elbow B Elbow 2.2 10.0 45 >50  A Elbow    9.8  9.0         Right Ulnar Motor (Abd Dig Minimi)  32C  Wrist    4.0 <3.1 10.1 >7 B Elbow Wrist 4.2 25.0 60 >50  B Elbow    8.2  8.4  A Elbow B Elbow 3.6 10.0 28 >50  A Elbow    11.8  7.6          EMG   Side Muscle Ins Act Fibs Psw Fasc Number Recrt Dur Dur. Amp Amp. Poly  Poly. Comment  Left Abd Poll Brev Nml Nml Nml Nml 3- Rapid All 1+ All 1+ All 1+ N/A  Left Biceps Nml Nml Nml Nml 1- Rapid Few 1+ Few 1+ Few 1+ N/A  Left Deltoid Nml Nml Nml Nml 2- Rapid Many 1+ Many 1+ Many 1+ N/A  Left PronatorTeres Nml Nml Nml Nml Nml Nml Nml Nml Nml Nml Nml Nml N/A  Left Triceps Nml Nml Nml Nml Nml Nml Nml Nml Nml Nml Nml Nml N/A  Left 1stDorInt Nml Nml Nml Nml Nml Nml Nml Nml Nml Nml Nml Nml N/A  Left FlexCarpiUln Nml Nml Nml Nml Nml Nml Nml Nml Nml Nml Nml Nml N/A  Left ABD Dig Min Nml Nml Nml Nml 1- Rapid Some 1+ Some 1+ Some 1+ N/A  Right Biceps Nml Nml Nml Nml 1- Rapid Few 1+ Few 1+ Few 1+ N/A  Right Deltoid Nml Nml Nml Nml 2- Rapid Many 1+ Many 1+ Many 1+ N/A  Right Triceps Nml Nml Nml Nml Nml Nml Nml Nml Nml Nml Nml Nml N/A  Right PronatorTeres Nml Nml Nml Nml Nml Nml Nml Nml Nml Nml Nml Nml N/A  Right ABD Dig Min Nml Nml Nml Nml 1- Rapid Some 1+ Some 1+ Some 1+ N/A  Right Abd Poll Brev Nml Nml Nml Nml 1- Rapid Some 1+ Some  1+ Few 1+ N/A  Right 1stDorInt Nml Nml Nml Nml 1- Rapid Some 1+ Some 1+ Some 1+ N/A  Right FlexCarpiUln Nml Nml Nml Nml 1- Rapid Some 1+ Some 1+ Some 1+ N/A      Waveforms:

## 2020-12-11 NOTE — Progress Notes (Signed)
Nerve conduction study shows bilateral carpal tunnel syndrome rated as severe worse than the left.   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  Next step is surgery referral.  Would you like to discuss this further in clinic?  If not happy to place referral to hand surgery now to discuss surgical options

## 2020-12-12 ENCOUNTER — Other Ambulatory Visit: Payer: Self-pay | Admitting: Family Medicine

## 2020-12-15 NOTE — Telephone Encounter (Signed)
Rx refill request approved per Dr. Corey's orders. 

## 2020-12-18 NOTE — Progress Notes (Signed)
I, Wendy Poet, LAT, ATC, am serving as scribe for Dr. Lynne Leader.  Austin [name] is a 80 y.o. male who presents to [place] at [facility] today for f/u of B wrist/hand pain / carpal tunnel syndrome.  He was last seen by Dr. Georgina Snell on 11/04/20 and due to limited relief from prior B carpal tunnel injections in Dec 2021, pt was referred for NCV testing that he had on 12/10/20.  He has B wrist night splints.  Since his last visit, pt reports improvement in wrist pain and improvement in numbness/tingling. Pt has been wearing wrist splints off and on during the night. Pt has some questions on what his next step is w/ his wrists.  Diagnostic imaging: R and L UE NCV/EMG- 12/10/20; C-spine XR- 11/20/19  Pertinent review of systems: No fevers or chills  Relevant historical information: Hypertension   Exam:  BP (!) 165/87 (BP Location: Right Arm, Patient Position: Sitting, Cuff Size: Normal)   Pulse 86   Ht 5\' 7"  (1.702 m)   Wt 207 lb 6.4 oz (94.1 kg)   SpO2 95%   BMI 32.48 kg/m  General: Well Developed, well nourished, and in no acute distress.   MSK: Hands normal-appearing bilaterally.    Lab and Radiology Results  Nerve conduction study report from December 10, 2020  Patient Complaints: This is a 80 year old man referred for evaluation of bilateral hand paresthesias.  NCV & EMG Findings: Extensive electrodiagnostic testing of the right upper extremity and additional studies of the left shows:  1. Median sensory response shows prolonged latency (5.8 ms) and reduced amplitude (5.6 V) on the right and absent response on the left. Bilateral ulnar sensory responses show prolonged latency (R4.1, L4.1 ms). 2. Bilateral median motor responses showlatency (L7.6, R6.5 ms) and reduced amplitude (L2.6, R3.9 mV). Bilateral ulnar motor responses show prolonged latency (L3.6, R4.0 ms) and decreased conduction velocity (A Elbow-B Elbow, L45, R28 m/s).  3. Chronic motor axonal loss  changes are seen affecting the right ulnar innervated muscles as well as bilateral abductor pollicis brevis and C5 myotomes. There is no evidence of accompanied active denervation.   Impression: 1. Bilateral median neuropathy at or distal to the wrist, consistent with a clinical diagnosis of carpal tunnel syndrome.  Overall, these findings are severe in degree electrically and worse on the left. 2. Bilateral ulnar neuropathy with slowing across the elbow, moderate and worse on the right. 3. 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  Patient is failing conservative management for carpal tunnel syndrome and is a surgical candidate.  He has severe radial disease upon a nerve conduction study.  After discussion with patient and reviewing his nerve conduction study results will refer to hand surgery for surgical consultation and surgical intervention.  Recheck back as needed.   PDMP not reviewed this encounter. Orders Placed This Encounter  Procedures  . Ambulatory referral to Orthopedic Surgery    Referral Priority:   Routine    Referral Type:   Surgical    Referral Reason:   Specialty Services Required    Referred to Provider:   Roseanne Kaufman, MD    Requested Specialty:   Orthopedic Surgery    Number of Visits Requested:   1   No orders of the defined types were placed in this encounter.    Discussed warning signs  or symptoms. Please see discharge instructions. Patient expresses understanding.   The above documentation has been reviewed and is accurate and complete Lynne Leader, M.D.  Total encounter time 20 minutes including face-to-face time with the patient and, reviewing past medical record, and charting on the date of service.   Reviewed nerve conduction study report and discuss surgical  options.

## 2020-12-19 ENCOUNTER — Other Ambulatory Visit: Payer: Self-pay

## 2020-12-19 ENCOUNTER — Ambulatory Visit (INDEPENDENT_AMBULATORY_CARE_PROVIDER_SITE_OTHER): Payer: Medicare Other | Admitting: Family Medicine

## 2020-12-19 VITALS — BP 165/87 | HR 86 | Ht 67.0 in | Wt 207.4 lb

## 2020-12-19 DIAGNOSIS — G5603 Carpal tunnel syndrome, bilateral upper limbs: Secondary | ICD-10-CM | POA: Diagnosis not present

## 2020-12-19 DIAGNOSIS — G5623 Lesion of ulnar nerve, bilateral upper limbs: Secondary | ICD-10-CM | POA: Diagnosis not present

## 2020-12-19 NOTE — Patient Instructions (Signed)
Thank you for coming in today.  You should hear from [facility] Dr Amedeo Plenty or his partners about appointment for surgery evaluation.   Recheck with me as needed for this other issues.

## 2020-12-29 ENCOUNTER — Other Ambulatory Visit: Payer: Self-pay | Admitting: Family Medicine

## 2021-01-20 DIAGNOSIS — G5603 Carpal tunnel syndrome, bilateral upper limbs: Secondary | ICD-10-CM | POA: Insufficient documentation

## 2021-01-21 ENCOUNTER — Other Ambulatory Visit: Payer: Self-pay | Admitting: Family Medicine

## 2021-01-22 DIAGNOSIS — G5620 Lesion of ulnar nerve, unspecified upper limb: Secondary | ICD-10-CM | POA: Insufficient documentation

## 2021-02-10 ENCOUNTER — Other Ambulatory Visit: Payer: Self-pay

## 2021-02-10 ENCOUNTER — Encounter: Payer: Self-pay | Admitting: Family Medicine

## 2021-02-10 ENCOUNTER — Ambulatory Visit (INDEPENDENT_AMBULATORY_CARE_PROVIDER_SITE_OTHER): Payer: Medicare Other | Admitting: Family Medicine

## 2021-02-10 VITALS — BP 130/72 | HR 89 | Temp 97.7°F | Ht 67.0 in | Wt 199.5 lb

## 2021-02-10 DIAGNOSIS — R739 Hyperglycemia, unspecified: Secondary | ICD-10-CM | POA: Diagnosis not present

## 2021-02-10 DIAGNOSIS — E785 Hyperlipidemia, unspecified: Secondary | ICD-10-CM

## 2021-02-10 DIAGNOSIS — E538 Deficiency of other specified B group vitamins: Secondary | ICD-10-CM | POA: Diagnosis not present

## 2021-02-10 DIAGNOSIS — C61 Malignant neoplasm of prostate: Secondary | ICD-10-CM

## 2021-02-10 DIAGNOSIS — I10 Essential (primary) hypertension: Secondary | ICD-10-CM | POA: Diagnosis not present

## 2021-02-10 DIAGNOSIS — E559 Vitamin D deficiency, unspecified: Secondary | ICD-10-CM

## 2021-02-10 DIAGNOSIS — Z23 Encounter for immunization: Secondary | ICD-10-CM

## 2021-02-10 LAB — COMPREHENSIVE METABOLIC PANEL
ALT: 21 U/L (ref 0–53)
AST: 16 U/L (ref 0–37)
Albumin: 4.1 g/dL (ref 3.5–5.2)
Alkaline Phosphatase: 79 U/L (ref 39–117)
BUN: 14 mg/dL (ref 6–23)
CO2: 27 mEq/L (ref 19–32)
Calcium: 9.2 mg/dL (ref 8.4–10.5)
Chloride: 104 mEq/L (ref 96–112)
Creatinine, Ser: 0.64 mg/dL (ref 0.40–1.50)
GFR: 89.61 mL/min (ref >60.00)
Glucose, Bld: 116 mg/dL — ABNORMAL HIGH (ref 70–99)
Potassium: 3.9 mEq/L (ref 3.5–5.1)
Sodium: 141 mEq/L (ref 135–145)
Total Bilirubin: 0.8 mg/dL (ref 0.2–1.2)
Total Protein: 6.8 g/dL (ref 6.0–8.3)

## 2021-02-10 LAB — CBC
HCT: 45.1 % (ref 39.0–52.0)
Hemoglobin: 15.3 g/dL (ref 13.0–17.0)
MCHC: 33.8 g/dL (ref 30.0–36.0)
MCV: 97.8 fl (ref 78.0–100.0)
Platelets: 221 10*3/uL (ref 150.0–400.0)
RBC: 4.61 Mil/uL (ref 4.22–5.81)
RDW: 13.7 % (ref 11.5–15.5)
WBC: 5.8 10*3/uL (ref 4.0–10.5)

## 2021-02-10 LAB — PSA: PSA: 4.18 ng/mL — ABNORMAL HIGH (ref 0.10–4.00)

## 2021-02-10 LAB — POCT GLYCOSYLATED HEMOGLOBIN (HGB A1C): Hemoglobin A1C: 5.7 % — AB (ref 4.0–5.6)

## 2021-02-10 LAB — LIPID PANEL
Cholesterol: 155 mg/dL (ref 0–200)
HDL: 46 mg/dL (ref >39.00)
LDL Cholesterol: 90 mg/dL (ref 0–99)
NonHDL: 108.77
Total CHOL/HDL Ratio: 3
Triglycerides: 95 mg/dL (ref 0.0–149.0)
VLDL: 19 mg/dL (ref 0.0–40.0)

## 2021-02-10 LAB — VITAMIN D 25 HYDROXY (VIT D DEFICIENCY, FRACTURES): VITD: 53.8 ng/mL (ref 30.00–100.00)

## 2021-02-10 LAB — TSH: TSH: 1.96 u[IU]/mL (ref 0.35–4.50)

## 2021-02-10 LAB — VITAMIN B12: Vitamin B-12: 776 pg/mL (ref 211–911)

## 2021-02-10 NOTE — Addendum Note (Signed)
Addended by: Cranston Neighbor on: 02/10/2021 10:47 AM   Modules accepted: Orders

## 2021-02-10 NOTE — Assessment & Plan Note (Signed)
Check PSA. ?

## 2021-02-10 NOTE — Assessment & Plan Note (Signed)
Check vitamin D. 

## 2021-02-10 NOTE — Patient Instructions (Signed)
It was very nice to see you today!  We will give your pneumonia vaccine today.  We will check blood work.  Your blood sugar looks good.  No medication changes today.  I will see you back in 6 months.  Come back to see me sooner if needed.  Take care, Dr Jerline Pain  PLEASE NOTE:  If you had any lab tests please let us know if you have not heard back within a few days. You may see your results on mychart before we have a chance to review them but we will give you a call once they are reviewed by [place]. If we ordered any referrals today, please let us know if you have not heard from their office within the next week.   Please try these tips to maintain a healthy lifestyle:   Eat at least 3 REAL meals and 1-2 snacks per day.  Aim for no more than 5 hours between eating.  If you eat breakfast, please do so within one hour of getting up.    Each meal should contain half fruits/vegetables, one quarter protein, and one quarter carbs (no bigger than a computer mouse)   Cut down on sweet beverages. This includes juice, soda, and sweet tea.     Drink at least 1 glass of water with each meal and aim for at least 8 glasses per day   Exercise at least 150 minutes every week.

## 2021-02-10 NOTE — Progress Notes (Signed)
   Austin [name] is a 80 y.o. male who presents today for an office visit.  Assessment/Plan:  Chronic Problems Addressed Today: Vitamin B12 deficiency On B12 1000 mcg daily.  Check B12 level today.  Dyslipidemia Check lipids.  Continue Lipitor 80 mg daily.  Continue fenofibrate 200 mg daily.  Essential hypertension At goal.  Continue losartan 100 mg daily.  Check labs today.  Vitamin D deficiency Check vitamin D.  Hyperglycemia A1c 5.7.  Will continue metformin 750 mg daily.  He has tolerating well without side effects.  Does not want to come off of it.  We will recheck again in 6 months.  Prostate cancer [facility]) Check PSA  Preventative Healthcare Prevnar 20 given today.    Subjective:  HPI:  See A/P       Objective:  Physical Exam: BP 130/72   Pulse 89   Temp 97.7 F (36.5 C)   Ht 5\' 7"  (1.702 m)   Wt 199 lb 8 oz (90.5 kg)   SpO2 94%   BMI 31.25 kg/m   Gen: No acute distress, resting comfortably CV: Regular rate and rhythm with no murmurs appreciated Pulm: Normal work of breathing, clear to auscultation bilaterally with no crackles, wheezes, or rhonchi Neuro: Grossly normal, moves all extremities Psych: Normal affect and thought content      Hafsa Lohn M. Jerline Pain, MD 02/10/2021 10:38 AM

## 2021-02-10 NOTE — Assessment & Plan Note (Signed)
At goal.  Continue losartan 100 mg daily.  Check labs today.

## 2021-02-10 NOTE — Assessment & Plan Note (Signed)
On B12 1000 mcg daily.  Check B12 level today.

## 2021-02-10 NOTE — Assessment & Plan Note (Signed)
A1c 5.7.  Will continue metformin 750 mg daily.  He has tolerating well without side effects.  Does not want to come off of it.  We will recheck again in 6 months.

## 2021-02-10 NOTE — Assessment & Plan Note (Signed)
Check lipids.  Continue Lipitor 80 mg daily.  Continue fenofibrate 200 mg daily.

## 2021-02-11 ENCOUNTER — Telehealth: Payer: Self-pay

## 2021-02-11 NOTE — Telephone Encounter (Signed)
Patient calling back about lab results.  °

## 2021-02-11 NOTE — Telephone Encounter (Signed)
Lab results given, see results note

## 2021-02-11 NOTE — Progress Notes (Signed)
Please inform patient of the following:  HIs labs are overall stable but his PSA is creeping up again we need to get him in to see a urologist. Please place referral if needed. WE can recheck everything else in 6 months.  Austin [name]. Jerline Pain, MD 02/11/2021 8:05 AM

## 2021-02-25 ENCOUNTER — Other Ambulatory Visit (HOSPITAL_COMMUNITY): Payer: Self-pay | Admitting: Urology

## 2021-02-25 DIAGNOSIS — C61 Malignant neoplasm of prostate: Secondary | ICD-10-CM

## 2021-02-26 LAB — HM DIABETES EYE EXAM

## 2021-03-07 ENCOUNTER — Other Ambulatory Visit: Payer: Self-pay | Admitting: Family Medicine

## 2021-03-10 ENCOUNTER — Other Ambulatory Visit: Payer: Self-pay

## 2021-03-10 ENCOUNTER — Ambulatory Visit (HOSPITAL_COMMUNITY)
Admission: RE | Admit: 2021-03-10 | Discharge: 2021-03-10 | Disposition: A | Discharge status: Home / Self Care | Payer: Medicare Other | Source: Ambulatory Visit | Attending: Urology | Admitting: Urology

## 2021-03-10 DIAGNOSIS — C61 Malignant neoplasm of prostate: Secondary | ICD-10-CM | POA: Insufficient documentation

## 2021-03-10 MED ORDER — PIFLIFOLASTAT F 18 (PYLARIFY) INJECTION
9.0000 | Freq: Once | INTRAVENOUS | Status: AC
  Administered 2021-03-10: 9.2 via INTRAVENOUS

## 2021-03-25 ENCOUNTER — Encounter: Payer: Self-pay | Admitting: Family Medicine

## 2021-04-13 ENCOUNTER — Other Ambulatory Visit: Payer: Self-pay | Admitting: Family Medicine

## 2021-04-14 ENCOUNTER — Other Ambulatory Visit: Payer: Self-pay | Admitting: Internal Medicine

## 2021-04-14 DIAGNOSIS — J449 Chronic obstructive pulmonary disease, unspecified: Secondary | ICD-10-CM

## 2021-04-14 NOTE — Telephone Encounter (Signed)
Rx refill request approved per Dr. Corey's orders. 

## 2021-05-04 ENCOUNTER — Other Ambulatory Visit: Payer: Self-pay | Admitting: Internal Medicine

## 2021-05-04 DIAGNOSIS — J449 Chronic obstructive pulmonary disease, unspecified: Secondary | ICD-10-CM

## 2021-05-11 ENCOUNTER — Other Ambulatory Visit: Payer: Self-pay | Admitting: Family Medicine

## 2021-06-22 ENCOUNTER — Other Ambulatory Visit: Payer: Self-pay | Admitting: Pulmonary Disease

## 2021-06-22 ENCOUNTER — Other Ambulatory Visit: Payer: Self-pay | Admitting: Family Medicine

## 2021-06-22 DIAGNOSIS — J449 Chronic obstructive pulmonary disease, unspecified: Secondary | ICD-10-CM

## 2021-08-10 ENCOUNTER — Ambulatory Visit: Payer: Medicare Other

## 2021-08-10 ENCOUNTER — Telehealth: Payer: Self-pay | Admitting: Family Medicine

## 2021-08-10 NOTE — Telephone Encounter (Signed)
Copied from [place] [id number]. Topic: Medicare AWV >> Aug 10, 2021  9:07 AM Harris-Coley, Hannah Beat wrote: Reason for CRM: LVM 08/10/21 @9 :07am to r/s 10:15am AWV appt today kh[facility]

## 2021-08-13 ENCOUNTER — Encounter: Payer: Self-pay | Admitting: Internal Medicine

## 2021-08-13 ENCOUNTER — Telehealth: Payer: Self-pay | Admitting: Pharmacy Technician

## 2021-08-13 ENCOUNTER — Other Ambulatory Visit: Payer: Self-pay

## 2021-08-13 ENCOUNTER — Ambulatory Visit (INDEPENDENT_AMBULATORY_CARE_PROVIDER_SITE_OTHER): Payer: Medicare Other | Admitting: Internal Medicine

## 2021-08-13 VITALS — BP 164/98 | HR 86 | Temp 97.6°F | Ht 69.0 in | Wt 199.0 lb

## 2021-08-13 DIAGNOSIS — J449 Chronic obstructive pulmonary disease, unspecified: Secondary | ICD-10-CM | POA: Diagnosis not present

## 2021-08-13 NOTE — Progress Notes (Signed)
Austin [name]    [id number]    [birth year]  Primary Care Physician:Parker, Algis Greenhouse, MD Date of Appointment: 08/13/2021 Established Patient Visit  Chief complaint:   Chief Complaint  Patient presents with   Asthma    HPI: Austin [name] is a 80 y.o. man with history of asthma copd overlap syndrome.   Interval Updates:  Here for follow up today after more than a year and a half of being seen. Has been on advair but it is $145 a month.Would like to know about cheaper options. He has been given a sample for breztri.  Has not had any prednisone or flares requiring antibiotics since our last visit over a year ago.   He does have albuterol prn and nebulizer treatments. He does go to the gym and feels like it's a struggle. He goes to the gym 1-2 times/week.   Having post nasal drainage.   Current Regimen: advair, albuterol Asthma Triggers: seasonal allergies Exacerbations in the last year: usually once/year.  History of hospitalization or intubation: never Hives: denies Allergy Testing: never had.  GERD: yes on prilosec.  Allergic Rhinitis: yes, but not on therapy.  ACT:  Asthma Control Test ACT Total Score  01/21/2020 13   FeNO: 40 ppb  I have reviewed the patient's family social and past medical history and updated as appropriate.   Past Medical History:  Diagnosis Date   Asthma    Depression    Hypertension    Prostate cancer [facility])    Urinary incontinence     Past Surgical History:  Procedure Laterality Date   GALLBLADDER SURGERY  1997   REPLACEMENT TOTAL KNEE BILATERAL  2001   2007   surgery replacement Bilateral 2005    Family History  Problem Relation Age of Onset   Diabetes Brother    Heart disease Paternal Grandfather    Asthma Neg Hx     Social History   Occupational History   Occupation: retired  Tobacco Use   Smoking status: Former    Types: Cigars    Quit date: 10/22/2019    Years since quitting: 1.8   Smokeless tobacco: Never    Tobacco comments:    smoked occ. with son. did not tolerate it long. 12/14/19.  Vaping Use   Vaping Use: Never used  Substance and Sexual Activity   Alcohol use: Yes   Drug use: Never   Sexual activity: Not on file     Physical Exam: Blood pressure (!) 164/98, pulse 86, temperature 97.6 F (36.4 C), temperature source Oral, height 5\' 9"  (1.753 m), weight 199 lb (90.3 kg), SpO2 93 %.  Gen:      No acute distress Lungs:    No increased respiratory effort, symmetric chest wall excursion, clear to auscultation bilaterally, no wheezes or crackles CV:         Regular rate and rhythm; no murmurs, rubs, or gallops.  No pedal edema   Data Reviewed: Imaging: I have personally reviewed the outside hospital records including pulmonary function tests and reports of CT chest from December 2019 which demonstrates air trapping, tracheobronchomalacia, mild bibasilar atelectasis  PFTs: Spirometry completed April 2021 shows moderately severe airflow limitation with an FEV1 53% of predicted.  I reviewed his PFTs from [facility] in [place] and these also demonstrated Airflow limitation with +BD response.   Labs:  Immunization status: Immunization History  Administered Date(s) Administered   Fluad Quad(high Dose 65+) 07/24/2019   Influenza-Unspecified 06/04/2020  PFIZER(Purple Top)SARS-COV-2 Vaccination 11/23/2019, 12/17/2019, 07/26/2020   PNEUMOCOCCAL CONJUGATE-20 02/10/2021    Assessment:  Asthma COPD overlap syndrome FEV1 53% of predicted.  Elevated Exhaled NO.  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  Will do benefits inquiry for breztri and prescribe once we know.  Start nasacort for post nasal drainage. Taking albuterol twice a day.   Return to Care: Return in about 3 months (around 11/13/2021).    Lenice Llamas, MD Pulmonary  and [place]

## 2021-08-13 NOTE — Telephone Encounter (Signed)
Converting to telephone note.

## 2021-08-13 NOTE — Patient Instructions (Addendum)
Please schedule follow up scheduled with myself in 3 months.  If my schedule is not open yet, we will contact you with a reminder closer to that time.  I will ask our pharmacy team to find out which medication is cheaper for you - we will call it in and let you know (breztri or symbicort or similar.)  Before your next visit I would like you to have:  Nasacort - 1 spray on each side of your nose twice a day for first week, then 1 spray on each side.   Instructions for use: If you also use a saline nasal spray or rinse, use that first. Position the head with the chin slightly tucked. Use the right hand to spray into the left nostril and the right hand to spray into the left nostril.   Point the bottle away from the septum of your nose (cartilage that divides the two sides of your nose).  Hold the nostril closed on the opposite side from where you will spray Spray once and gently sniff to pull the medicine into the higher parts of your nose.  Don't sniff too hard as the medicine will drain down the back of your throat instead. Repeat with a second spray on the same side if prescribed. Repeat on the other side of your nose.

## 2021-08-13 NOTE — Telephone Encounter (Signed)
-----   Message from Spero Geralds, MD sent at 08/13/2021 11:27 AM EDT ----- Please do benefits inquiry for synbicort, Austin [name]

## 2021-08-17 ENCOUNTER — Telehealth: Payer: Self-pay | Admitting: Internal Medicine

## 2021-08-17 NOTE — Telephone Encounter (Signed)
See my chart message.  Will close this documentation

## 2021-08-21 ENCOUNTER — Telehealth: Payer: Self-pay | Admitting: Internal Medicine

## 2021-08-21 NOTE — Telephone Encounter (Signed)
[facility]   Provider returns on Monday. Will forward message to provider.

## 2021-08-24 ENCOUNTER — Other Ambulatory Visit (HOSPITAL_COMMUNITY): Payer: Self-pay

## 2021-08-24 NOTE — Telephone Encounter (Signed)
Hi there never received anything about this and patient is calling in. Please do Benefits inquiry. thanks

## 2021-08-24 NOTE — Telephone Encounter (Signed)
My apologies. Ran test claim for following inhalers. Please note pt is in Coverage Gap (donut hole) with $407.65 remaining before deductible is met: Symbicort: $101.91 Breo: $103.18 Breztri: $163.40

## 2021-08-24 NOTE — Telephone Encounter (Signed)
Called and spoke with patient, provided information regarding the inhalers per Dr. Shearon Stalls.  Patient currently has advair and would like to trya a sample of the [place] first to see if it helps.  Advised I will see when we should be receiving more samples as we are currently out and will follow up in a couple of days.

## 2021-08-24 NOTE — Telephone Encounter (Signed)
Ran the cost of the following inhalers for him. Please note pt is in Coverage Gap (donut hole) with $407.65 remaining before deductible is met:   Symbicort: $101.91 Breo: $103.18 Breztri: $163.40  Please prescribe either advair 230-21 or Breztri to his pharmacy based on this.

## 2021-08-27 NOTE — Telephone Encounter (Signed)
Still no breztri samples as of 10:31 AM 08/27/21

## 2021-08-28 MED ORDER — BREZTRI AEROSPHERE 160-9-4.8 MCG/ACT IN AERO: 2.0000 | INHALATION_SPRAY | Freq: Two times a day (BID) | RESPIRATORY_TRACT | 0 refills | Status: DC

## 2021-08-28 NOTE — Telephone Encounter (Signed)
I have placed 2 samples of Breztri up front for the pt. LMTCB to let him know.

## 2021-08-28 NOTE — Telephone Encounter (Signed)
Informed patient samples of Judithann Sauger are ready for him to pick up.

## 2021-08-31 ENCOUNTER — Other Ambulatory Visit: Payer: Self-pay

## 2021-08-31 ENCOUNTER — Other Ambulatory Visit: Payer: Self-pay | Admitting: Nurse Practitioner

## 2021-08-31 ENCOUNTER — Encounter: Payer: Self-pay | Admitting: Nurse Practitioner

## 2021-08-31 ENCOUNTER — Ambulatory Visit (INDEPENDENT_AMBULATORY_CARE_PROVIDER_SITE_OTHER): Payer: Medicare Other

## 2021-08-31 ENCOUNTER — Ambulatory Visit (INDEPENDENT_AMBULATORY_CARE_PROVIDER_SITE_OTHER): Payer: Medicare Other | Admitting: Nurse Practitioner

## 2021-08-31 VITALS — BP 144/88 | HR 81 | Temp 98.3°F | Ht 69.0 in | Wt 198.4 lb

## 2021-08-31 DIAGNOSIS — R051 Acute cough: Secondary | ICD-10-CM

## 2021-08-31 DIAGNOSIS — J302 Other seasonal allergic rhinitis: Secondary | ICD-10-CM

## 2021-08-31 DIAGNOSIS — K219 Gastro-esophageal reflux disease without esophagitis: Secondary | ICD-10-CM

## 2021-08-31 DIAGNOSIS — J301 Allergic rhinitis due to pollen: Secondary | ICD-10-CM

## 2021-08-31 DIAGNOSIS — J4489 Other specified chronic obstructive pulmonary disease: Secondary | ICD-10-CM | POA: Insufficient documentation

## 2021-08-31 DIAGNOSIS — J449 Chronic obstructive pulmonary disease, unspecified: Secondary | ICD-10-CM | POA: Diagnosis not present

## 2021-08-31 MED ORDER — MONTELUKAST SODIUM 10 MG PO TABS: 10.0000 mg | ORAL_TABLET | Freq: Every day | ORAL | 2 refills | Status: DC

## 2021-08-31 MED ORDER — BREZTRI AEROSPHERE 160-9-4.8 MCG/ACT IN AERO: 2.0000 | INHALATION_SPRAY | Freq: Two times a day (BID) | RESPIRATORY_TRACT | 0 refills | Status: DC

## 2021-08-31 MED ORDER — LORATADINE 10 MG PO TABS: 10.0000 mg | ORAL_TABLET | Freq: Every day | ORAL | 2 refills | Status: DC

## 2021-08-31 MED ORDER — PREDNISONE 10 MG PO TABS: ORAL_TABLET | ORAL | 0 refills | Status: DC

## 2021-08-31 MED ORDER — OMEPRAZOLE 20 MG PO CPDR: 40.0000 mg | DELAYED_RELEASE_CAPSULE | Freq: Every day | ORAL | 0 refills | Status: DC

## 2021-08-31 NOTE — Assessment & Plan Note (Addendum)
Start [place], as previously ordered. CXR today to evaluate for underlying infectious or inflam etiology. Possible flare related to postnasal drip, GERD symptoms or combination. Could also be related to viral etiology.  Patient Instructions  Start Breztri 2 puffs Twice daily, rinse after Continue albuterol inhaler 2 puffs or 3 mL neb every 6 hours as needed for shortness of breath and wheezing Continue Nasacort nasal spray 1 spray each nostril daily Increase Prilosec (omeprazole) to 40 mg daily until your next visit. We will reassess then.  Start Singulair 10 mg At bedtime Start loratidine 10 mg daily   Prednisone taper pack 4 tabs for 2 days, then 3 tabs for 2 days, 2 tabs for 2 days, then 1 tab for 2 days, then stop   Chest x ray today. We will notify you of results.   Notify if worsening breathlessness, cough, mucus production, fatigue, or wheezing occurs.  Maintain up to date vaccinations, including influenza, COVID, and pneumococcal.  Wash your hands often and avoid sick exposures.  Encouraged masking in crowds.  Smoking cessation strongly encouraged and discussed.  Avoid triggers, when possible.  Exercise, as tolerated. Notify if worsening symptoms upon exertion occur.   Asthma Action Plan in place  Follow up in 1 month with Dr. Shearon Stalls or Alanson Aly. If symptoms do not improve or worsen, please contact office for sooner follow up or seek emergency care.

## 2021-08-31 NOTE — Progress Notes (Deleted)
@Patient  ID: Austin [name], male    DOB: 1941-07-21, 80 y.o.   MRN: [id number]  No chief complaint on file.   Referring provider: Vivi Barrack, MD  HPI: 80 year old male, former cigar smoker followed for asthma COPD overlap syndrome. He is a patient of Dr. Mauricio Po and was last seen in office on 08/13/2021. Past medical history significant for HTN, GERD, prostate cancer, HLD, and depression.   TEST/EVENTS:  09/2018 CT Chest: air trapping, tracheobronchomalacia, mild bibasilar atelectasis. 01/21/2020 ACT: 13 01/2020 PFTs: moderately severe airflow limitation with an FEV1 53% of predicted. Consistent with previous testing 08/13/2021 FeNO: 40 ppb  08/13/2021: OV with Dr. Shearon Stalls. F/u after 1.5 yrs. He was on Advair and reported it had increased to $145 a month and inquired about cheaper alternatives. Provided with Breztri samples. Coverage gap noted. He denied any recent prednisone or flares requiring abx since his previous visit. Reported that he attends the gym 1-2x/week but it's a struggle. Started nasacort. Albuterol PRN continued. Continued on Prilosec for GERD tx.   08/31/2021: Today - acute sick visit Patient present today with chest tightness. This began __ and has persisted since. SOB, wheezing, orthopnea, leg swelling, cough? Any lightheaded or dizziness? Meds? Fevers, chills? Any traumatic event before?   No Known Allergies  Immunization History  Administered Date(s) Administered   Fluad Quad(high Dose 65+) 07/24/2019   Influenza-Unspecified 06/04/2020   PFIZER(Purple Top)SARS-COV-2 Vaccination 11/23/2019, 12/17/2019, 07/26/2020   PNEUMOCOCCAL CONJUGATE-20 02/10/2021    Past Medical History:  Diagnosis Date   Asthma    Depression    Hypertension    Prostate cancer ([place])    Urinary incontinence     Tobacco History: Social History   Tobacco Use  Smoking Status Former   Types: Cigars   Quit date: 10/22/2019   Years since quitting: 1.8  Smokeless Tobacco Never   Tobacco Comments   smoked occ. with son. did not tolerate it long. 12/14/19.   Counseling given: Not Answered Tobacco comments: smoked occ. with son. did not tolerate it long. 12/14/19.   Outpatient Medications Prior to Visit  Medication Sig Dispense Refill   albuterol (PROVENTIL) (2.5 MG/3ML) 0.083% nebulizer solution SMARTSIG:3 Milliliter(s) Via Nebulizer Every 4 Hours PRN     albuterol (VENTOLIN HFA) 108 (90 Base) MCG/ACT inhaler INHALE 1-2 PUFFS INTO THE LUNGS EVERY 6 (SIX) HOURS AS NEEDED FOR WHEEZING OR SHORTNESS OF BREATH. 18 each 5   atorvastatin (LIPITOR) 80 MG tablet TAKE 1 TABLET BY MOUTH EVERY DAY 90 tablet 1   Budeson-Glycopyrrol-Formoterol (BREZTRI AEROSPHERE) 160-9-4.8 MCG/ACT AERO Inhale 2 puffs into the lungs in the morning and at bedtime. 5.9 g 0   Cyanocobalamin (VITAMIN B 12 PO) Take 1,000 Units by mouth.     escitalopram (LEXAPRO) 20 MG tablet TAKE 1 TABLET BY MOUTH EVERY DAY 90 tablet 3   fenofibrate micronized (LOFIBRA) 200 MG capsule TAKE 1 CAPSULE BY MOUTH EVERY DAY 90 capsule 0   fluticasone-salmeterol (ADVAIR HFA) 230-21 MCG/ACT inhaler TAKE 2 PUFFS BY MOUTH TWICE A DAY 2 each 0   glucose blood test strip Check glucose TID / E11.9 100 each 12   ibuprofen (ADVIL) 800 MG tablet TAKE 1 TABLET BY MOUTH EVERY 8 HOURS AS NEEDED 30 tablet 0   Lancets Misc. (UNISTIK 2 EXTRA) MISC Use to check blood sugar 3 times daily/ Dx E11.9 200 each 0   losartan (COZAAR) 100 MG tablet Take 100 mg by mouth daily.     magnesium oxide (MAG-OX) 400 (240  Mg) MG tablet TAKE 1 TABLET BY MOUTH EVERY DAY 90 tablet 3   magnesium oxide (MAG-OX) 400 MG tablet Take 1 tablet by mouth daily.     metFORMIN (GLUCOPHAGE-XR) 750 MG 24 hr tablet TAKE 1 TABLET BY MOUTH EVERY DAY WITH BREAKFAST 90 tablet 0   omeprazole (PRILOSEC) 20 MG capsule TAKE 1 CAPSULE BY MOUTH EVERY DAY 90 capsule 1   Vitamin D, Cholecalciferol, 50 MCG (2000 UT) CAPS Take by mouth.     No facility-administered medications prior to  visit.     Review of Systems:   Constitutional: No weight loss or gain, night sweats, fevers, chills, fatigue, or lassitude. HEENT: No headaches, difficulty swallowing, tooth/dental problems, or sore throat. No sneezing, itching, ear ache, nasal congestion, or post nasal drip CV:  No chest pain, orthopnea, PND, swelling in lower extremities, anasarca, dizziness, palpitations, syncope Resp: No shortness of breath with exertion or at rest. No excess mucus or change in color of mucus. No productive or non-productive. No hemoptysis. No wheezing.  No chest wall deformity GI:  No heartburn, indigestion, abdominal pain, nausea, vomiting, diarrhea, change in bowel habits, loss of appetite, bloody stools.  GU: No dysuria, change in color of urine, urgency or frequency.  No flank pain, no hematuria  Skin: No rash, lesions, ulcerations MSK:  No joint pain or swelling.  No decreased range of motion.  No back pain. Neuro: No dizziness or lightheadedness.  Psych: No depression or anxiety. Mood stable.     Physical Exam:  There were no vitals taken for this visit.  GEN: Pleasant, interactive, well-nourished/chronically-ill appearing/acutely-ill appearing/poorly-nourished/morbidly obese; in no acute distress.****** HEENT:  Normocephalic and atraumatic. EACs patent bilaterally. TM pearly gray with present light reflex bilaterally. PERRLA. Sclera white. Nasal turbinates pink, moist and patent bilaterally. No rhinorrhea present. Oropharynx pink and moist, without exudate or edema. No lesions, ulcerations, or postnasal drip.  NECK:  Supple w/ fair ROM. No JVD present. Normal carotid impulses w/o bruits. Thyroid symmetrical with no goiter or nodules palpated. No lymphadenopathy.   CV: RRR, no m/r/g, no peripheral edema. Pulses intact, +2 bilaterally. No cyanosis, pallor or clubbing. PULMONARY:  Unlabored, regular breathing. Clear bilaterally A&P w/o wheezes/rales/rhonchi. No accessory muscle use. No dullness  to percussion. GI: BS present and normoactive. Soft, non-tender to palpation. No organomegaly or masses detected. No CVA tenderness. MSK: No erythema, warmth or tenderness. Cap refil <2 sec all extrem. No deformities or joint swelling noted.  Neuro: A/Ox3. No focal deficits noted.   Skin: Warm, no lesions or rashe Psych: Normal affect and behavior. Judgement and thought content appropriate.     Lab Results:  CBC    Component Value Date/Time   WBC 5.8 02/10/2021 1045   RBC 4.61 02/10/2021 1045   HGB 15.3 02/10/2021 1045   HCT 45.1 02/10/2021 1045   PLT 221.0 02/10/2021 1045   MCV 97.8 02/10/2021 1045   MCH 32.3 08/13/2020 1053   MCHC 33.8 02/10/2021 1045   RDW 13.7 02/10/2021 1045    BMET    Component Value Date/Time   NA 141 02/10/2021 1045   K 3.9 02/10/2021 1045   CL 104 02/10/2021 1045   CO2 27 02/10/2021 1045   GLUCOSE 116 (H) 02/10/2021 1045   BUN 14 02/10/2021 1045   CREATININE 0.64 02/10/2021 1045   CREATININE 0.65 (L) 08/13/2020 1053   CALCIUM 9.2 02/10/2021 1045    BNP No results found for: BNP   Imaging:  No results found.    No  flowsheet data found.  Lab Results  Component Value Date   NITRICOXIDE 40 01/17/2020        Assessment & Plan:   No problem-specific Assessment & Plan notes found for this encounter.     Clayton Bibles, NP 08/31/2021  Pt aware and understands NP's role.

## 2021-08-31 NOTE — Progress Notes (Signed)
@Patient  ID: Austin [name], male    DOB: [birth year], 80 y.o.   MRN: [id number]  Chief Complaint  Patient presents with   Follow-up    Pt states that breathing is better but cough is the problem and has phlegm build up.     Referring provider: Vivi Barrack, MD  HPI: 80 year old male, former cigar smoker followed for asthma COPD overlap syndrome.  He is a patient of Dr.Desai's and was last seen in office on 08/13/2021.  Past medical history significant for hypertension, GERD, prostate cancer, and depression.  TEST/EVENTS:  09/2018: air trapping, tracheobronchomalacia, mild bibasilar atelectasis 01/2020 PFTs: moderately severe airflow limitation with FEV1 53% of predicted 01/21/2020 ACT: 13 08/13/2021 FeNO: 40 ppb  08/13/2021: OV with Dr. Shearon Stalls.  Follow-up after greater than a year and a half is being seen.  Reported no recent flares requiring prednisone or antibiotics since last visit.  Inquired about cheaper options as Advair is $145 a month.  He reports going to the gym 1-2 times a week but feels like it is a struggle.  He reported increased postnasal drainage.  He was provided with samples for [facility].  Coverage gap concern. Rx for Nasacort for postnasal drainage.  Follow-up 3 months  08/31/2021: Today-acute visit Patient presents today for report of increased cough that started late Friday afternoon. He reports clear sputum production and clear rhinorrhea. He also feels like "something gets stuck in his chest" before he has to cough. His shortness of breath is unchanged. He denies congestion, fever, chills, wheezing, or recent sick exposures. He denies orthopnea, PND, or leg swelling. He has not started his Breztri inhaler, as discussed at previous visit. He has still been using his Advair, nasacort, and Prilosec daily. He has occasionally used his rescue inhaler with relief. Overall, he feels his breathing is stable but wants to "get ahead of his cough".  No Known  Allergies  Immunization History  Administered Date(s) Administered   Fluad Quad(high Dose 65+) 07/24/2019   Influenza, High Dose Seasonal PF 05/31/2021   Influenza-Unspecified 06/04/2020   PFIZER(Purple Top)SARS-COV-2 Vaccination 11/23/2019, 12/17/2019, 07/26/2020   PNEUMOCOCCAL CONJUGATE-20 02/10/2021    Past Medical History:  Diagnosis Date   Asthma    Depression    Hypertension    Prostate cancer ([place])    Urinary incontinence     Tobacco History: Social History   Tobacco Use  Smoking Status Former   Types: Cigars   Quit date: 10/22/2019   Years since quitting: 1.8  Smokeless Tobacco Never  Tobacco Comments   smoked occ. with son. did not tolerate it long. 12/14/19.   Counseling given: Not Answered Tobacco comments: smoked occ. with son. did not tolerate it long. 12/14/19.   Outpatient Medications Prior to Visit  Medication Sig Dispense Refill   albuterol (PROVENTIL) (2.5 MG/3ML) 0.083% nebulizer solution SMARTSIG:3 Milliliter(s) Via Nebulizer Every 4 Hours PRN     albuterol (VENTOLIN HFA) 108 (90 Base) MCG/ACT inhaler INHALE 1-2 PUFFS INTO THE LUNGS EVERY 6 (SIX) HOURS AS NEEDED FOR WHEEZING OR SHORTNESS OF BREATH. 18 each 5   atorvastatin (LIPITOR) 80 MG tablet TAKE 1 TABLET BY MOUTH EVERY DAY 90 tablet 1   Budeson-Glycopyrrol-Formoterol (BREZTRI AEROSPHERE) 160-9-4.8 MCG/ACT AERO Inhale 2 puffs into the lungs in the morning and at bedtime. 5.9 g 0   Cyanocobalamin (VITAMIN B 12 PO) Take 1,000 Units by mouth.     escitalopram (LEXAPRO) 20 MG tablet TAKE 1 TABLET BY MOUTH EVERY DAY 90 tablet 3  fenofibrate micronized (LOFIBRA) 200 MG capsule TAKE 1 CAPSULE BY MOUTH EVERY DAY 90 capsule 0   glucose blood test strip Check glucose TID / E11.9 100 each 12   ibuprofen (ADVIL) 800 MG tablet TAKE 1 TABLET BY MOUTH EVERY 8 HOURS AS NEEDED 30 tablet 0   Lancets Misc. (UNISTIK 2 EXTRA) MISC Use to check blood sugar 3 times daily/ Dx E11.9 200 each 0   losartan (COZAAR) 100 MG  tablet Take 100 mg by mouth daily.     magnesium oxide (MAG-OX) 400 (240 Mg) MG tablet TAKE 1 TABLET BY MOUTH EVERY DAY 90 tablet 3   magnesium oxide (MAG-OX) 400 MG tablet Take 1 tablet by mouth daily.     metFORMIN (GLUCOPHAGE-XR) 750 MG 24 hr tablet TAKE 1 TABLET BY MOUTH EVERY DAY WITH BREAKFAST 90 tablet 0   omeprazole (PRILOSEC) 20 MG capsule TAKE 1 CAPSULE BY MOUTH EVERY DAY 90 capsule 1   Vitamin D, Cholecalciferol, 50 MCG (2000 UT) CAPS Take by mouth.     fluticasone-salmeterol (ADVAIR HFA) 230-21 MCG/ACT inhaler TAKE 2 PUFFS BY MOUTH TWICE A DAY (Patient not taking: Reported on 08/31/2021) 2 each 0   No facility-administered medications prior to visit.     Review of Systems:   Constitutional: No weight loss or gain, night sweats, fevers, chills, fatigue, or lassitude. HEENT: No headaches, difficulty swallowing, tooth/dental problems, or sore throat. No sneezing, itching, ear ache, nasal congestion. + rhinorrhea and post nasal drip CV:  No chest pain, orthopnea, PND, swelling in lower extremities, anasarca, dizziness, palpitations, syncope Resp: +shortness of breath with exertion (unchanged). Increased cough with clear sputum production. No hemoptysis. No wheezing.  No chest wall deformity GI:  +heartburn (occasional fullness feeling). No abdominal pain, nausea, vomiting, diarrhea, change in bowel habits, loss of appetite, bloody stools.  GU: No dysuria, change in color of urine, urgency or frequency.  No flank pain, no hematuria  Skin: No rash, lesions, ulcerations MSK:  No joint pain or swelling.  No decreased range of motion.  No back pain. Neuro: No dizziness or lightheadedness.  Psych: No depression or anxiety. Mood stable.     Physical Exam:  BP (!) 144/88 (BP Location: Left Arm, Patient Position: Sitting, Cuff Size: Normal)   Pulse 81   Temp 98.3 F (36.8 C) (Oral)   Ht 5\' 9"  (1.753 m)   Wt 198 lb 6.4 oz (90 kg)   SpO2 96% Comment: room air  BMI 29.30 kg/m    GEN: Pleasant, interactive, well-nourished; in no acute distress. HEENT:  Normocephalic and atraumatic. EACs patent bilaterally. TM pearly gray with present light reflex bilaterally. PERRLA. Sclera white. Nasal turbinates pink, moist and patent bilaterally. No rhinorrhea present. Oropharynx erythematous and moist, without exudate or edema. No lesions, ulcerations, or postnasal drip.  NECK:  Supple w/ fair ROM. No JVD present. Normal carotid impulses w/o bruits. Thyroid symmetrical with no goiter or nodules palpated. No lymphadenopathy.   CV: RRR, no m/r/g, no peripheral edema. Pulses intact, +2 bilaterally. No cyanosis, pallor or clubbing. PULMONARY:  Unlabored, regular breathing. Clear bilaterally A&P w/o wheezes/rales/rhonchi. No accessory muscle use. No dullness to percussion. GI: BS present and normoactive. Soft, non-tender to palpation. No organomegaly or masses detected. No CVA tenderness. MSK: No erythema, warmth or tenderness. Cap refil <2 sec all extrem. No deformities or joint swelling noted.  Neuro: A/Ox3. No focal deficits noted.   Skin: Warm, no lesions or rashe Psych: Normal affect and behavior. Judgement and thought content appropriate.  Lab Results:  CBC    Component Value Date/Time   WBC 5.8 02/10/2021 1045   RBC 4.61 02/10/2021 1045   HGB 15.3 02/10/2021 1045   HCT 45.1 02/10/2021 1045   PLT 221.0 02/10/2021 1045   MCV 97.8 02/10/2021 1045   MCH 32.3 08/13/2020 1053   MCHC 33.8 02/10/2021 1045   RDW 13.7 02/10/2021 1045    BMET    Component Value Date/Time   NA 141 02/10/2021 1045   K 3.9 02/10/2021 1045   CL 104 02/10/2021 1045   CO2 27 02/10/2021 1045   GLUCOSE 116 (H) 02/10/2021 1045   BUN 14 02/10/2021 1045   CREATININE 0.64 02/10/2021 1045   CREATININE 0.65 (L) 08/13/2020 1053   CALCIUM 9.2 02/10/2021 1045    BNP No results found for: BNP   Imaging:  DG Chest 2 View  Result Date: 08/31/2021 CLINICAL DATA:  Progressive cough.  Sputum  production. EXAM: CHEST - 2 VIEW COMPARISON:  PET scan 03/10/2021 FINDINGS: Heart is normal in size. Atherosclerotic changes are noted at the arch. Chronic elevation of the right hemidiaphragm noted. Linear atelectasis or scarring again noted at the lung bases bilaterally. No significant airspace disease is present. No edema or effusion is present. Bilateral shoulder replacements noted. IMPRESSION: No acute cardiopulmonary disease or significant interval change. Electronically Signed   By: San Morelle M.D.   On: 08/31/2021 15:40      No flowsheet data found.  Lab Results  Component Value Date   NITRICOXIDE 40 01/17/2020    08/31/2021: CXR reviewed by me. Linear atelectasis or scarring noted at the lung bases, bilaterally. Unchanged from prior exam. No edema or effusion. No significant airspace disease present. No acute cardiopulmonary disease noted.     Assessment & Plan:   Asthma-COPD overlap syndrome [facility]) Start [place], as previously ordered. CXR today to evaluate for underlying infectious or inflam etiology. Possible flare related to postnasal drip, GERD symptoms or combination. Could also be related to viral etiology.  Patient Instructions  Start Breztri 2 puffs Twice daily, rinse after Continue albuterol inhaler 2 puffs or 3 mL neb every 6 hours as needed for shortness of breath and wheezing Continue Nasacort nasal spray 1 spray each nostril daily Increase Prilosec (omeprazole) to 40 mg daily until your next visit. We will reassess then.  Start Singulair 10 mg At bedtime Start loratidine 10 mg daily   Prednisone taper pack 4 tabs for 2 days, then 3 tabs for 2 days, 2 tabs for 2 days, then 1 tab for 2 days, then stop   Chest x ray today. We will notify you of results.   Notify if worsening breathlessness, cough, mucus production, fatigue, or wheezing occurs.  Maintain up to date vaccinations, including influenza, COVID, and pneumococcal.  Wash your hands often and  avoid sick exposures.  Encouraged masking in crowds.  Smoking cessation strongly encouraged and discussed.  Avoid triggers, when possible.  Exercise, as tolerated. Notify if worsening symptoms upon exertion occur.   Asthma Action Plan in place  Follow up in 1 month with Dr. Shearon Stalls or Alanson Aly. If symptoms do not improve or worsen, please contact office for sooner follow up or seek emergency care.       Gastroesophageal reflux disease See above plan  Seasonal allergic rhinitis See above plan.     Clayton Bibles, NP 08/31/2021  Pt aware and understands NP's role.

## 2021-08-31 NOTE — Assessment & Plan Note (Signed)
See above plan. 

## 2021-08-31 NOTE — Patient Instructions (Addendum)
Start Breztri 2 puffs Twice daily, rinse after Continue albuterol inhaler 2 puffs or 3 mL neb every 6 hours as needed for shortness of breath and wheezing Continue Nasacort nasal spray 1 spray each nostril daily Increase Prilosec (omeprazole) to 40 mg daily until your next visit. We will reassess then.  Start Singulair 10 mg At bedtime Start loratidine 10 mg daily   Prednisone taper pack 4 tabs for 2 days, then 3 tabs for 2 days, 2 tabs for 2 days, then 1 tab for 2 days, then stop   Chest x ray today. We will notify you of results.   Notify if worsening breathlessness, cough, mucus production, fatigue, or wheezing occurs.  Maintain up to date vaccinations, including influenza, COVID, and pneumococcal.  Wash your hands often and avoid sick exposures.  Encouraged masking in crowds.  Smoking cessation strongly encouraged and discussed.  Avoid triggers, when possible.  Exercise, as tolerated. Notify if worsening symptoms upon exertion occur.   Asthma Action Plan in place  Follow up in 1 month with Dr. Shearon Stalls or Alanson Aly. If symptoms do not improve or worsen, please contact office for sooner follow up or seek emergency care.

## 2021-09-09 ENCOUNTER — Other Ambulatory Visit: Payer: Self-pay | Admitting: Family Medicine

## 2021-09-09 ENCOUNTER — Telehealth: Payer: Self-pay | Admitting: Nurse Practitioner

## 2021-09-09 ENCOUNTER — Other Ambulatory Visit: Payer: Self-pay | Admitting: Pulmonary Disease

## 2021-09-09 ENCOUNTER — Other Ambulatory Visit: Payer: Self-pay | Admitting: Nurse Practitioner

## 2021-09-09 DIAGNOSIS — K219 Gastro-esophageal reflux disease without esophagitis: Secondary | ICD-10-CM

## 2021-09-09 DIAGNOSIS — J449 Chronic obstructive pulmonary disease, unspecified: Secondary | ICD-10-CM

## 2021-09-09 DIAGNOSIS — J441 Chronic obstructive pulmonary disease with (acute) exacerbation: Secondary | ICD-10-CM

## 2021-09-09 DIAGNOSIS — J301 Allergic rhinitis due to pollen: Secondary | ICD-10-CM

## 2021-09-09 MED ORDER — AMOXICILLIN-POT CLAVULANATE 875-125 MG PO TABS: 1.0000 | ORAL_TABLET | Freq: Two times a day (BID) | ORAL | 0 refills | Status: DC

## 2021-09-09 MED ORDER — PREDNISONE 10 MG PO TABS: ORAL_TABLET | ORAL | 0 refills | Status: DC

## 2021-09-09 NOTE — Telephone Encounter (Signed)
ATC LVMTCB x 1  

## 2021-09-09 NOTE — Telephone Encounter (Signed)
Spoke with patient to let him know the recs from [place]. Advised him that she has sent in new prescriptions for him and that she would like for him to have an OV with her on Friday. Patient has been scheduled with Austin [name] on Friday at 9 am. Nothing further needed at this time.

## 2021-09-09 NOTE — Telephone Encounter (Signed)
Spoke with pt who states hoarseness and chest tightness has increased since finishing Prednisone taper on Sunday. Pt states he is still having productive cough and is requesting more Prednisone. Pt denies fever/SOB/GI upset. Pt is following all instructions given by Belenda Cruise and states albuterol nebs to provide a small amount of relief. Belenda Cruise could you please advise? Thank you.

## 2021-09-09 NOTE — Telephone Encounter (Signed)
I will send a prescription for Augmentin for 7 days and prednisone 3 day taper for COPD exacerbation. Take both with food. Take prednisone in the AM. OTC probiotic daily while on Augmentin. Finish the abx in it's entirety and don't stop just because symptoms improve.   Since his symptoms are worse, he needs to also come back into the office for evaluation. You can schedule him with me on Friday in any of my open slots, unless someone has a sooner opening. Ensure he is taking his Breztri, singulair, and loratidine, as he was supposed to start these after our last visit.  If he continues to worsen, he needs to go to the ED. Thank you!

## 2021-09-10 NOTE — Progress Notes (Signed)
@Patient  ID: Austin [name], male    DOB: 1941/08/10, 80 y.o.   MRN: [id number]  Chief Complaint  Patient presents with   Follow-up    Much improved.  Still coughing.    Referring provider: Vivi Barrack, MD  HPI: 80 year old male, former cigar smoker followed for asthma and COPD overlap syndrome.  He is a patient of Dr. Mauricio Po and was last seen in office on 08/31/2021 by myself.  Past medical history significant for hypertension, GERD, prostate cancer, depression.  TEST/EVENTS:  09/2018 CT chest: Air trapping, tracheobronchial malacia, mild bibasilar atelectasis 01/2020 PFTs: Moderately severe airflow limitation with FEV1 53% of predicted 01/21/2020 ACT: 13 08/13/2021 FeNO: 40 ppb  08/31/2021: OV with Maddyx Vallie,NP.  Seen for increased cough with clear sputum production and clear rhinorrhea.  Shortness of breath unchanged from baseline.  Patient had not started his Breztri inhaler ordered at previous visit.  Advised to start Breztri 2 puffs twice daily.  Started Singulair at bedtime and Claritin daily.  Prednisone 2 day taper pack.  Chest x-ray showed linear atelectasis/scarring at lung bases bilaterally which was unchanged from prior exam.  No acute cardiopulmonary disease found.  09/09/2021: Telephoned office.  Reports of increased hoarseness and chest tightness since finishing prednisone on Sunday.  Persistent productive cough with no improvement.  Rx for Augmentin 7 days and prednisone 3-day taper sent.  Patient ordered to follow-up in office for further evaluation.  09/11/2021: Today - acute sick visit  Patient presents today after contacting the office on 11/30 reporting his symptoms had worsened including worsening hoarseness and cough. Rx for augmentin 7 days was sent and prednisone 3 day taper sent. Today, he states his symptoms have significantly improved. His cough has decreased and he is no longer experiencing any hoarseness. His shortness of breath has improved and he reports it as  occasional and mild with exertion. He does report a dry mouth, which is new. He denies any pain or soreness or difficulties swallowing. His reflux has improved and he no longer is experiencing symptoms of fullness with his increase in omeprazole. His rhinorrhea is clear and has also significantly improved. He denies any fevers, body aches, wheezing, orthopnea, PND, chest pain or tightness, or leg swelling. Overall, he feels well and has no further complaints.   No Known Allergies  Immunization History  Administered Date(s) Administered   Fluad Quad(high Dose 65+) 07/24/2019   Influenza, High Dose Seasonal PF 05/31/2021   Influenza-Unspecified 06/04/2020   PFIZER(Purple Top)SARS-COV-2 Vaccination 11/23/2019, 12/17/2019, 07/26/2020   PNEUMOCOCCAL CONJUGATE-20 02/10/2021    Past Medical History:  Diagnosis Date   Asthma    Depression    Hypertension    Prostate cancer ([place])    Urinary incontinence     Tobacco History: Social History   Tobacco Use  Smoking Status Former   Types: Cigars   Quit date: 10/22/2019   Years since quitting: 1.8  Smokeless Tobacco Never  Tobacco Comments   smoked occ. with son. did not tolerate it long. 12/14/19.   Counseling given: Not Answered Tobacco comments: smoked occ. with son. did not tolerate it long. 12/14/19.   Outpatient Medications Prior to Visit  Medication Sig Dispense Refill   ADVAIR HFA 230-21 MCG/ACT inhaler TAKE 2 PUFFS BY MOUTH TWICE A DAY 12 each 1   albuterol (PROVENTIL) (2.5 MG/3ML) 0.083% nebulizer solution SMARTSIG:3 Milliliter(s) Via Nebulizer Every 4 Hours PRN     albuterol (VENTOLIN HFA) 108 (90 Base) MCG/ACT inhaler INHALE 1-2 PUFFS INTO THE LUNGS EVERY  6 (SIX) HOURS AS NEEDED FOR WHEEZING OR SHORTNESS OF BREATH. 18 each 5   amoxicillin-clavulanate (AUGMENTIN) 875-125 MG tablet Take 1 tablet by mouth 2 (two) times daily. 14 tablet 0   atorvastatin (LIPITOR) 80 MG tablet TAKE 1 TABLET BY MOUTH EVERY DAY 90 tablet 1    Cyanocobalamin (VITAMIN B 12 PO) Take 1,000 Units by mouth.     escitalopram (LEXAPRO) 20 MG tablet TAKE 1 TABLET BY MOUTH EVERY DAY 90 tablet 3   fenofibrate micronized (LOFIBRA) 200 MG capsule TAKE 1 CAPSULE BY MOUTH EVERY DAY 90 capsule 0   glucose blood (FREESTYLE LITE) test strip CHECK BLOOD SUGAR THREE TIMES A DAY E11.9 100 strip 12   ibuprofen (ADVIL) 800 MG tablet TAKE 1 TABLET BY MOUTH EVERY 8 HOURS AS NEEDED 30 tablet 0   Lancets Misc. (UNISTIK 2 EXTRA) MISC Use to check blood sugar 3 times daily/ Dx E11.9 200 each 0   loratadine (CLARITIN) 10 MG tablet TAKE 1 TABLET BY MOUTH EVERY DAY 90 tablet 0   losartan (COZAAR) 100 MG tablet Take 100 mg by mouth daily.     magnesium oxide (MAG-OX) 400 (240 Mg) MG tablet TAKE 1 TABLET BY MOUTH EVERY DAY 90 tablet 3   magnesium oxide (MAG-OX) 400 MG tablet Take 1 tablet by mouth daily.     metFORMIN (GLUCOPHAGE-XR) 750 MG 24 hr tablet TAKE 1 TABLET BY MOUTH EVERY DAY WITH BREAKFAST 90 tablet 0   montelukast (SINGULAIR) 10 MG tablet TAKE 1 TABLET BY MOUTH EVERYDAY AT BEDTIME 90 tablet 0   omeprazole (PRILOSEC) 20 MG capsule TAKE 1 CAPSULE BY MOUTH EVERY DAY 90 capsule 1   predniSONE (DELTASONE) 10 MG tablet 4 tabs for 2 days, then 3 tabs for 2 days, 2 tabs for 2 days, then 1 tab for 2 days, then stop 20 tablet 0   Vitamin D, Cholecalciferol, 50 MCG (2000 UT) CAPS Take by mouth.     Budeson-Glycopyrrol-Formoterol (BREZTRI AEROSPHERE) 160-9-4.8 MCG/ACT AERO Inhale 2 puffs into the lungs in the morning and at bedtime. (Patient not taking: Reported on 09/11/2021) 5.9 g 0   Budeson-Glycopyrrol-Formoterol (BREZTRI AEROSPHERE) 160-9-4.8 MCG/ACT AERO Inhale 2 puffs into the lungs in the morning and at bedtime. (Patient not taking: Reported on 09/11/2021) 5.9 g 0   omeprazole (PRILOSEC) 20 MG capsule TAKE 2 CAPSULES BY MOUTH EVERY DAY (Patient not taking: Reported on 09/11/2021) 60 capsule 0   predniSONE (DELTASONE) 10 MG tablet 4 tabs for 3 days, then 3 tabs for  3 days, 2 tabs for 3 days, then 1 tab for 3 days, then stop. Take with food. (Patient not taking: Reported on 09/11/2021) 30 tablet 0   No facility-administered medications prior to visit.     Review of Systems:   Constitutional: No weight loss or gain, night sweats, fevers, chills, fatigue, or lassitude. HEENT: +occasional post nasal drip and clear rhinorrhea (improved); dry mouth. No headaches, difficulty swallowing, tooth/dental problems, or sore throat. No sneezing, itching, ear ache, nasal congestion. CV:  No chest pain, orthopnea, PND, swelling in lower extremities, anasarca, dizziness, palpitations, syncope Resp: +mild, occasional SOB with exertion (significantly improved per pt); improved productive cough with thin, clear sputum).  No hemoptysis. No wheezing.  No chest wall deformity GI:  No heartburn, indigestion, abdominal pain, nausea, vomiting, diarrhea, change in bowel habits, loss of appetite, bloody stools.  GU: No dysuria, change in color of urine, urgency or frequency.  No flank pain, no hematuria  Skin: No rash, lesions, ulcerations  MSK:  No joint pain or swelling.  No decreased range of motion.  No back pain. Neuro: No dizziness or lightheadedness.  Psych: No depression or anxiety. Mood stable.     Physical Exam:  BP 140/80 (BP Location: Right Arm, Patient Position: Sitting, Cuff Size: Large)   Pulse 87   Temp 98.3 F (36.8 C) (Oral)   Ht 5\' 9"  (1.753 m)   Wt 196 lb 3.2 oz (89 kg)   SpO2 97%   BMI 28.97 kg/m   GEN: Pleasant, interactive, well-nourished; in no acute distress. HEENT:  Normocephalic and atraumatic. EACs patent bilaterally. TM pearly gray with present light reflex bilaterally. PERRLA. Sclera white. Nasal turbinates pink, moist and patent bilaterally. No rhinorrhea present. Oropharynx pink and moist, without exudate or edema. White/yellow plaques on tongue.  NECK:  Supple w/ fair ROM. No JVD present. Normal carotid impulses w/o bruits. Thyroid  symmetrical with no goiter or nodules palpated. No lymphadenopathy.   CV: RRR, no m/r/g, no peripheral edema. Pulses intact, +2 bilaterally. No cyanosis, pallor or clubbing. PULMONARY:  Unlabored, regular breathing. Clear bilaterally A&P w/o wheezes/rales/rhonchi. No accessory muscle use. No dullness to percussion. GI: BS present and normoactive. Soft, non-tender to palpation. No organomegaly or masses detected. No CVA tenderness. MSK: No erythema, warmth or tenderness. Cap refil <2 sec all extrem. No deformities or joint swelling noted.  Neuro: A/Ox3. No focal deficits noted.   Skin: Warm, no lesions or rashe Psych: Normal affect and behavior. Judgement and thought content appropriate.     Lab Results:  CBC    Component Value Date/Time   WBC 5.8 02/10/2021 1045   RBC 4.61 02/10/2021 1045   HGB 15.3 02/10/2021 1045   HCT 45.1 02/10/2021 1045   PLT 221.0 02/10/2021 1045   MCV 97.8 02/10/2021 1045   MCH 32.3 08/13/2020 1053   MCHC 33.8 02/10/2021 1045   RDW 13.7 02/10/2021 1045    BMET    Component Value Date/Time   NA 141 02/10/2021 1045   K 3.9 02/10/2021 1045   CL 104 02/10/2021 1045   CO2 27 02/10/2021 1045   GLUCOSE 116 (H) 02/10/2021 1045   BUN 14 02/10/2021 1045   CREATININE 0.64 02/10/2021 1045   CREATININE 0.65 (L) 08/13/2020 1053   CALCIUM 9.2 02/10/2021 1045    BNP No results found for: BNP   Imaging:  DG Chest 2 View  Result Date: 08/31/2021 CLINICAL DATA:  Progressive cough.  Sputum production. EXAM: CHEST - 2 VIEW COMPARISON:  PET scan 03/10/2021 FINDINGS: Heart is normal in size. Atherosclerotic changes are noted at the arch. Chronic elevation of the right hemidiaphragm noted. Linear atelectasis or scarring again noted at the lung bases bilaterally. No significant airspace disease is present. No edema or effusion is present. Bilateral shoulder replacements noted. IMPRESSION: No acute cardiopulmonary disease or significant interval change. Electronically  Signed   By: San Morelle M.D.   On: 08/31/2021 15:40      No flowsheet data found.  Lab Results  Component Value Date   NITRICOXIDE 40 01/17/2020        Assessment & Plan:   Asthma-COPD overlap syndrome (HCC) Baseline SOB symptoms significantly improved with maintenance regimen. Cough and hoarseness improved with abx therapy and second round of prednisone. Overall improvement. Continue with previously prescribed plan, finish abx and prednisone taper. Will keep planned follow up.   Patient Instructions  Continue breztri inhaler 2 puffs Twice daily, rinse after Continue albuterol neb 3 mL or inhaler 2 puffs  every 6 hours as needed for shortness of breath or wheezing Continue loratidine 10 mg daily Continue singulair 10 mg At bedtime Continue omeprazole 40 mg daily for acid reflux Continue nasocort nasal spray 1 spray each nostril daily Continue saline nasal spray 2-3 times a day   Continue augmentin (antibiotic) Twice daily until finished. Take with food. Continue taking daily probiotic until you are finished with your antibiotic  Continue prednisone taper until finished.   Nystatin oral rinse 5 mL four times a day for thrush for 10 days. Ensure you are rinsing your mouth well after you use your Breztri inhaler.  Saline nasal gel into each nostril at night   Asthma Action Plan in place Notify if worsening breathlessness, cough, mucus production, fatigue, or wheezing occurs.  Maintain up to date vaccinations, including influenza, COVID, and pneumococcal.  Wash your hands often and avoid sick exposures.  Encouraged masking in crowds.  Avoid triggers, when possible.  Exercise, as tolerated. Notify if worsening symptoms upon exertion occur.    Follow up for your scheduled appointment later this month. If symptoms do not improve or worsen, please contact office for sooner follow up or seek emergency care.   Seasonal allergic rhinitis Improved on current regimen. No  changes. See above plan.  Gastroesophageal reflux disease Improved with increase to 40mg  omeprazole daily. Will continue on current regimen. See above plan.  Oral candidiasis Re-educated on importance of rinsing mouth after ICS in Breztri. Nystatin ordered. See above plan.      Clayton Bibles, NP 09/11/2021  Pt aware and understands NP's role.

## 2021-09-11 ENCOUNTER — Other Ambulatory Visit: Payer: Self-pay

## 2021-09-11 ENCOUNTER — Encounter: Payer: Self-pay | Admitting: Nurse Practitioner

## 2021-09-11 ENCOUNTER — Telehealth: Payer: Self-pay | Admitting: *Deleted

## 2021-09-11 ENCOUNTER — Ambulatory Visit (INDEPENDENT_AMBULATORY_CARE_PROVIDER_SITE_OTHER): Payer: Medicare Other

## 2021-09-11 ENCOUNTER — Ambulatory Visit (INDEPENDENT_AMBULATORY_CARE_PROVIDER_SITE_OTHER): Payer: Medicare Other | Admitting: Nurse Practitioner

## 2021-09-11 VITALS — BP 140/80 | HR 87 | Temp 98.3°F | Ht 69.0 in | Wt 196.2 lb

## 2021-09-11 DIAGNOSIS — J302 Other seasonal allergic rhinitis: Secondary | ICD-10-CM | POA: Diagnosis not present

## 2021-09-11 DIAGNOSIS — B370 Candidal stomatitis: Secondary | ICD-10-CM | POA: Diagnosis not present

## 2021-09-11 DIAGNOSIS — J449 Chronic obstructive pulmonary disease, unspecified: Secondary | ICD-10-CM | POA: Diagnosis not present

## 2021-09-11 DIAGNOSIS — K219 Gastro-esophageal reflux disease without esophagitis: Secondary | ICD-10-CM | POA: Diagnosis not present

## 2021-09-11 DIAGNOSIS — Z0000 Encounter for general adult medical examination without abnormal findings: Secondary | ICD-10-CM | POA: Diagnosis not present

## 2021-09-11 MED ORDER — BREZTRI AEROSPHERE 160-9-4.8 MCG/ACT IN AERO: 2.0000 | INHALATION_SPRAY | Freq: Two times a day (BID) | RESPIRATORY_TRACT | 0 refills | Status: DC

## 2021-09-11 MED ORDER — NYSTATIN 100000 UNIT/ML MT SUSP: 5.0000 mL | Freq: Four times a day (QID) | OROMUCOSAL | 0 refills | Status: AC

## 2021-09-11 NOTE — Assessment & Plan Note (Signed)
Improved on current regimen. No changes. See above plan.

## 2021-09-11 NOTE — Assessment & Plan Note (Signed)
Re-educated on importance of rinsing mouth after ICS in [place]. Nystatin ordered. See above plan.

## 2021-09-11 NOTE — Patient Instructions (Addendum)
Continue breztri inhaler 2 puffs Twice daily, rinse after Continue albuterol neb 3 mL or inhaler 2 puffs every 6 hours as needed for shortness of breath or wheezing Continue loratidine 10 mg daily Continue singulair 10 mg At bedtime Continue omeprazole 40 mg daily for acid reflux Continue nasocort nasal spray 1 spray each nostril daily Continue saline nasal spray 2-3 times a day   Continue augmentin (antibiotic) Twice daily until finished. Take with food. Continue taking daily probiotic until you are finished with your antibiotic  Continue prednisone taper until finished.   Nystatin oral rinse 5 mL four times a day for thrush for 10 days. Ensure you are rinsing your mouth well after you use your Breztri inhaler.  Saline nasal gel into each nostril at night   Asthma Action Plan in place Notify if worsening breathlessness, cough, mucus production, fatigue, or wheezing occurs.  Maintain up to date vaccinations, including influenza, COVID, and pneumococcal.  Wash your hands often and avoid sick exposures.  Encouraged masking in crowds.  Avoid triggers, when possible.  Exercise, as tolerated. Notify if worsening symptoms upon exertion occur.    Follow up for your scheduled appointment later this month. If symptoms do not improve or worsen, please contact office for sooner follow up or seek emergency care.

## 2021-09-11 NOTE — Telephone Encounter (Signed)
ATC patient to change appointment from 09/30/21 at 11 am with Joellen Jersey (NP) to 09/25/21 with Dr. Shearon Stalls at 11 am per Dr. Mauricio Po request.  I have put patient on Desai's schedule for 11 am on 12/16, however, I left a vm for patient to return call.  Dr. Shearon Stalls is not in the office on 12/21.

## 2021-09-11 NOTE — Assessment & Plan Note (Signed)
Improved with increase to 40mg  omeprazole daily. Will continue on current regimen. See above plan.

## 2021-09-11 NOTE — Progress Notes (Signed)
Virtual Visit via Telephone Note  I connected with  Austin [name] on 09/11/21 at  1:45 PM EST by telephone and verified that I am speaking with the correct person using two identifiers.  Medicare Annual Wellness visit completed telephonically due to Covid-19 pandemic.   Persons participating in this call: This Health Coach and this patient.   Location: Patient: Home Provider: Office   I discussed the limitations, risks, security and privacy concerns of performing an evaluation and management service by telephone and the availability of in person appointments. The patient expressed understanding and agreed to proceed.  Unable to perform video visit due to video visit attempted and failed and/or patient does not have video capability.   Some vital signs may be absent or patient reported.   Willette Brace, LPN   Subjective:   Austin [name] is a 80 y.o. male who presents for Medicare Annual/Subsequent preventive examination.  Review of Systems     Cardiac Risk Factors include: advanced age (>81men, >46 women);male gender;dyslipidemia;hypertension     Objective:    There were no vitals filed for this visit. There is no height or weight on file to calculate BMI.  Advanced Directives 09/11/2021 08/04/2020 02/19/2020  Does Patient Have a Medical Advance Directive? Yes Yes No  Type of Advance Directive Living will [place];Living will -  Copy of [place] in Chart? No - copy requested No - copy requested -  Would patient like information on creating a medical advance directive? - - No - Patient declined    Current Medications (verified) Outpatient Encounter Medications as of 09/11/2021  Medication Sig   ADVAIR HFA 230-21 MCG/ACT inhaler TAKE 2 PUFFS BY MOUTH TWICE A DAY   albuterol (PROVENTIL) (2.5 MG/3ML) 0.083% nebulizer solution SMARTSIG:3 Milliliter(s) Via Nebulizer Every 4 Hours PRN   albuterol (VENTOLIN HFA) 108 (90 Base) MCG/ACT  inhaler INHALE 1-2 PUFFS INTO THE LUNGS EVERY 6 (SIX) HOURS AS NEEDED FOR WHEEZING OR SHORTNESS OF BREATH.   amoxicillin-clavulanate (AUGMENTIN) 875-125 MG tablet Take 1 tablet by mouth 2 (two) times daily.   atorvastatin (LIPITOR) 80 MG tablet TAKE 1 TABLET BY MOUTH EVERY DAY   Budeson-Glycopyrrol-Formoterol (BREZTRI AEROSPHERE) 160-9-4.8 MCG/ACT AERO Inhale 2 puffs into the lungs in the morning and at bedtime.   Cyanocobalamin (VITAMIN B 12 PO) Take 1,000 Units by mouth.   escitalopram (LEXAPRO) 20 MG tablet TAKE 1 TABLET BY MOUTH EVERY DAY   fenofibrate micronized (LOFIBRA) 200 MG capsule TAKE 1 CAPSULE BY MOUTH EVERY DAY   glucose blood (FREESTYLE LITE) test strip CHECK BLOOD SUGAR THREE TIMES A DAY E11.9   ibuprofen (ADVIL) 800 MG tablet TAKE 1 TABLET BY MOUTH EVERY 8 HOURS AS NEEDED   Lancets Misc. (UNISTIK 2 EXTRA) MISC Use to check blood sugar 3 times daily/ Dx E11.9   loratadine (CLARITIN) 10 MG tablet TAKE 1 TABLET BY MOUTH EVERY DAY   losartan (COZAAR) 100 MG tablet Take 100 mg by mouth daily.   magnesium oxide (MAG-OX) 400 MG tablet Take 1 tablet by mouth daily.   metFORMIN (GLUCOPHAGE-XR) 750 MG 24 hr tablet TAKE 1 TABLET BY MOUTH EVERY DAY WITH BREAKFAST   montelukast (SINGULAIR) 10 MG tablet TAKE 1 TABLET BY MOUTH EVERYDAY AT BEDTIME   nystatin (MYCOSTATIN) 100000 UNIT/ML suspension Take 5 mLs (500,000 Units total) by mouth 4 (four) times daily for 10 days.   omeprazole (PRILOSEC) 20 MG capsule TAKE 1 CAPSULE BY MOUTH EVERY DAY   predniSONE (DELTASONE) 10 MG  tablet 4 tabs for 2 days, then 3 tabs for 2 days, 2 tabs for 2 days, then 1 tab for 2 days, then stop   Vitamin D, Cholecalciferol, 50 MCG (2000 UT) CAPS Take by mouth.   [DISCONTINUED] magnesium oxide (MAG-OX) 400 (240 Mg) MG tablet TAKE 1 TABLET BY MOUTH EVERY DAY   [DISCONTINUED] Budeson-Glycopyrrol-Formoterol (BREZTRI AEROSPHERE) 160-9-4.8 MCG/ACT AERO Inhale 2 puffs into the lungs in the morning and at bedtime. (Patient  not taking: Reported on 09/11/2021)   [DISCONTINUED] Budeson-Glycopyrrol-Formoterol (BREZTRI AEROSPHERE) 160-9-4.8 MCG/ACT AERO Inhale 2 puffs into the lungs in the morning and at bedtime. (Patient not taking: Reported on 09/11/2021)   [DISCONTINUED] omeprazole (PRILOSEC) 20 MG capsule TAKE 2 CAPSULES BY MOUTH EVERY DAY (Patient not taking: Reported on 09/11/2021)   [DISCONTINUED] predniSONE (DELTASONE) 10 MG tablet 4 tabs for 3 days, then 3 tabs for 3 days, 2 tabs for 3 days, then 1 tab for 3 days, then stop. Take with food. (Patient not taking: Reported on 09/11/2021)   No facility-administered encounter medications on file as of 09/11/2021.    Allergies (verified) Patient has no known allergies.   History: Past Medical History:  Diagnosis Date   Asthma    Depression    Hypertension    Prostate cancer ([place])    Urinary incontinence    Past Surgical History:  Procedure Laterality Date   GALLBLADDER SURGERY  1997   REPLACEMENT TOTAL KNEE BILATERAL  2001   2007   surgery replacement Bilateral 2005   Family History  Problem Relation Age of Onset   Diabetes Brother    Heart disease Paternal Grandfather    Asthma Neg Hx    Social History   Socioeconomic History   Marital status: Married    Spouse name: Not on file   Number of children: Not on file   Years of education: Not on file   Highest education level: Not on file  Occupational History   Occupation: retired  Tobacco Use   Smoking status: Former    Types: Cigars    Quit date: 10/22/2019    Years since quitting: 1.8   Smokeless tobacco: Never   Tobacco comments:    smoked occ. with son. did not tolerate it long. 12/14/19.  Vaping Use   Vaping Use: Never used  Substance and Sexual Activity   Alcohol use: Yes   Drug use: Never   Sexual activity: Not on file  Other Topics Concern   Not on file  Social History Narrative   Not on file   Social Determinants of Health   Financial Resource Strain: Low Risk    Difficulty  of Paying Living Expenses: Not hard at all  Food Insecurity: No Food Insecurity   Worried About Charity fundraiser in the Last Year: Never true   [place] in the Last Year: Never true  Transportation Needs: No Transportation Needs   Lack of Transportation (Medical): No   Lack of Transportation (Non-Medical): No  Physical Activity: Insufficiently Active   Days of Exercise per Week: 1 day   Minutes of Exercise per Session: 30 min  Stress: No Stress Concern Present   Feeling of Stress : Only a little  Social Connections: Moderately Isolated   Frequency of Communication with Friends and Family: More than three times a week   Frequency of Social Gatherings with Friends and Family: More than three times a week   Attends Religious Services: Never   Marine scientist or  Organizations: No   Attends Archivist Meetings: Never   Marital Status: Married    Tobacco Counseling Counseling given: Not Answered Tobacco comments: smoked occ. with son. did not tolerate it long. 12/14/19.   Clinical Intake:  Pre-visit preparation completed: Yes  Pain : No/denies pain     BMI - recorded: 28.97 Nutritional Status: BMI 25 -29 Overweight Nutritional Risks: None  How often do you need to have someone help you when you read instructions, pamphlets, or other written materials from your doctor or pharmacy?: 1 - Never  Diabetic?No  Interpreter Needed?: No  Information entered by :: Charlott Rakes, LPN   Activities of Daily Living In your present state of health, do you have any difficulty performing the following activities: 09/11/2021  Hearing? Y  Comment wears hearing aids  Vision? N  Difficulty concentrating or making decisions? N  Walking or climbing stairs? N  Dressing or bathing? N  Doing errands, shopping? N  Preparing Food and eating ? N  Using the Toilet? N  In the past six months, have you accidently leaked urine? Y  Comment wears a depends  Do you have  problems with loss of bowel control? N  Managing your Medications? N  Managing your Finances? N  Housekeeping or managing your Housekeeping? N  Some recent data might be hidden    Patient Care Team: Vivi Barrack, MD as PCP - General (Family Medicine)  Indicate any recent Medical Services you may have received from other than [facility] providers in the past year (date may be approximate).     Assessment:   This is a routine wellness examination for [facility].  Hearing/Vision screen Hearing Screening - Comments:: Pt wears hearing aids  Vision Screening - Comments:: Pt follows up with provider annually   Dietary issues and exercise activities discussed: Current Exercise Habits: Home exercise routine, Type of exercise: Other - see comments, Time (Minutes): 30, Frequency (Times/Week): 1, Weekly Exercise (Minutes/Week): 30   Goals Addressed             This Visit's Progress    Patient Stated       None at this time       Depression Screen PHQ 2/9 Scores 09/11/2021 08/04/2020 05/05/2020 11/08/2019  PHQ - 2 Score 1 2 0 5  PHQ- 9 Score - 4 0 18    Fall Risk Fall Risk  09/11/2021 02/10/2021 08/04/2020 11/08/2019  Falls in the past year? 0 0 0 0  Number falls in past yr: 0 - 0 -  Injury with Fall? 0 - 0 -  Risk for fall due to : Impaired vision - Impaired vision -  Follow up Falls prevention discussed - - -    FALL RISK PREVENTION PERTAINING TO THE HOME:  Any stairs in or around the home? Yes  If so, are there any without handrails? No  Home free of loose throw rugs in walkways, pet beds, electrical cords, etc? Yes  Adequate lighting in your home to reduce risk of falls? Yes   ASSISTIVE DEVICES UTILIZED TO PREVENT FALLS:  Life alert? No  Use of a cane, walker or w/c? No  Grab bars in the bathroom? Yes  Shower chair or bench in shower? Yes  Elevated toilet seat or a handicapped toilet? Yes   TIMED UP AND GO:  Was the test performed? No .   Cognitive Function:     6CIT  Screen 09/11/2021 08/04/2020  What Year? 0 points 0 points  What  month? 0 points 0 points  What time? 0 points -  Count back from 20 0 points 0 points  Months in reverse 2 points 4 points  Repeat phrase 2 points 2 points  Total Score 4 -    Immunizations Immunization History  Administered Date(s) Administered   Fluad Quad(high Dose 65+) 07/24/2019   Influenza, High Dose Seasonal PF 05/31/2021   Influenza-Unspecified 06/04/2020   PFIZER(Purple Top)SARS-COV-2 Vaccination 11/23/2019, 12/17/2019, 07/26/2020   PNEUMOCOCCAL CONJUGATE-20 02/10/2021    TDAP status: Due, Education has been provided regarding the importance of this vaccine. Advised may receive this vaccine at local pharmacy or Health Dept. Aware to provide a copy of the vaccination record if obtained from local pharmacy or Health Dept. Verbalized acceptance and understanding.  Flu Vaccine status: Up to date  Pneumococcal vaccine status: Up to date  Covid-19 vaccine status: Completed vaccines  Qualifies for Shingles Vaccine? Yes   Zostavax completed No   Shingrix Completed?: No.    Education has been provided regarding the importance of this vaccine. Patient has been advised to call insurance company to determine out of pocket expense if they have not yet received this vaccine. Advised may also receive vaccine at local pharmacy or Health Dept. Verbalized acceptance and understanding.  Screening Tests Health Maintenance  Topic Date Due   Zoster Vaccines- Shingrix (1 of 2) Never done   COVID-19 Vaccine (4 - Booster for Pfizer series) 09/20/2020   TETANUS/TDAP  02/10/2022 (Originally 12/02/1959)   Pneumonia Vaccine 45+ Years old  Completed   INFLUENZA VACCINE  Completed   HPV VACCINES  Aged Out    Health Maintenance  Health Maintenance Due  Topic Date Due   Zoster Vaccines- Shingrix (1 of 2) Never done   COVID-19 Vaccine (4 - Booster for Pfizer series) 09/20/2020    Colorectal cancer screening: No longer required.     Additional Screening:    Vision Screening: Recommended annual ophthalmology exams for early detection of glaucoma and other disorders of the eye. Is the patient up to date with their annual eye exam?  Yes  Who is the provider or what is the name of the office in which the patient attends annual eye exams? Unsure of provider name  If pt is not established with a provider, would they like to be referred to a provider to establish care? No .   Dental Screening: Recommended annual dental exams for proper oral hygiene  Community Resource Referral / Chronic Care Management: CRR required this visit?  No   CCM required this visit?  No      Plan:     I have personally reviewed and noted the following in the patient's chart:   Medical and social history Use of alcohol, tobacco or illicit drugs  Current medications and supplements including opioid prescriptions. Patient is not currently taking opioid prescriptions. Functional ability and status Nutritional status Physical activity Advanced directives List of other physicians Hospitalizations, surgeries, and ER visits in previous 12 months Vitals Screenings to include cognitive, depression, and falls Referrals and appointments  In addition, I have reviewed and discussed with patient certain preventive protocols, quality metrics, and best practice recommendations. A written personalized care plan for preventive services as well as general preventive health recommendations were provided to patient.     Willette Brace, LPN   [id number]   Nurse Notes: None

## 2021-09-11 NOTE — Patient Instructions (Signed)
Mr. Austin [name] , Thank you for taking time to come for your Medicare Wellness Visit. I appreciate your ongoing commitment to your health goals. Please review the following plan we discussed and let me know if I can assist you in the future.   Screening recommendations/referrals: Colonoscopy: no longer required  Recommended yearly ophthalmology/optometry visit for glaucoma screening and checkup Recommended yearly dental visit for hygiene and checkup  Vaccinations: Influenza vaccine: Done 05/31/21 repeat  Pneumococcal vaccine: Up to date Tdap vaccine: Postponed  Shingles vaccine: Shingrix discussed. Please contact your pharmacy for coverage information.    Covid-19: Completed 2/12, 3/8, 07/16/20   Advanced directives: Please bring a copy of your health care power of attorney and living will to the office at your convenience  Conditions/risks identified: None at this time   Next appointment: Follow up in one year for your annual wellness visit.   Preventive Care 45 Years and Older, Male Preventive care refers to lifestyle choices and visits with your health care provider that can promote health and wellness. What does preventive care include? A yearly physical exam. This is also called an annual well check. Dental exams once or twice a year. Routine eye exams. Ask your health care provider how often you should have your eyes checked. Personal lifestyle choices, including: Daily care of your teeth and gums. Regular physical activity. Eating a healthy diet. Avoiding tobacco and drug use. Limiting alcohol use. Practicing safe sex. Taking low doses of aspirin every day. Taking vitamin and mineral supplements as recommended by your health care provider. What happens during an annual well check? The services and screenings done by your health care provider during your annual well check will depend on your age, overall health, lifestyle risk factors, and family history of disease. Counseling   Your health care provider may ask you questions about your: Alcohol use. Tobacco use. Drug use. Emotional well-being. Home and relationship well-being. Sexual activity. Eating habits. History of falls. Memory and ability to understand (cognition). Work and work Statistician. Screening  You may have the following tests or measurements: Height, weight, and BMI. Blood pressure. Lipid and cholesterol levels. These may be checked every 5 years, or more frequently if you are over 24 years old. Skin check. Lung cancer screening. You may have this screening every year starting at age 41 if you have a 30-pack-year history of smoking and currently smoke or have quit within the past 15 years. Fecal occult blood test (FOBT) of the stool. You may have this test every year starting at age 33. Flexible sigmoidoscopy or colonoscopy. You may have a sigmoidoscopy every 5 years or a colonoscopy every 10 years starting at age 72. Prostate cancer screening. Recommendations will vary depending on your family history and other risks. Hepatitis C blood test. Hepatitis B blood test. Sexually transmitted disease (STD) testing. Diabetes screening. This is done by checking your blood sugar (glucose) after you have not eaten for a while (fasting). You may have this done every 1-3 years. Abdominal aortic aneurysm (AAA) screening. You may need this if you are a current or former smoker. Osteoporosis. You may be screened starting at age 2 if you are at high risk. Talk with your health care provider about your test results, treatment options, and if necessary, the need for more tests. Vaccines  Your health care provider may recommend certain vaccines, such as: Influenza vaccine. This is recommended every year. Tetanus, diphtheria, and acellular pertussis (Tdap, Td) vaccine. You may need a Td booster every 10  years. Zoster vaccine. You may need this after age 43. Pneumococcal 13-valent conjugate (PCV13) vaccine.  One dose is recommended after age 48. Pneumococcal polysaccharide (PPSV23) vaccine. One dose is recommended after age 8. Talk to your health care provider about which screenings and vaccines you need and how often you need them. This information is not intended to replace advice given to you by your health care provider. Make sure you discuss any questions you have with your health care provider. Document Released: 10/24/2015 Document Revised: 06/16/2016 Document Reviewed: 07/29/2015 Elsevier Interactive Patient Education  2017 [place] Prevention in the Home Falls can cause injuries. They can happen to people of all ages. There are many things you can do to make your home safe and to help prevent falls. What can I do on the outside of my home? Regularly fix the edges of walkways and driveways and fix any cracks. Remove anything that might make you trip as you walk through a door, such as a raised step or threshold. Trim any bushes or trees on the path to your home. Use bright outdoor lighting. Clear any walking paths of anything that might make someone trip, such as rocks or tools. Regularly check to see if handrails are loose or broken. Make sure that both sides of any steps have handrails. Any raised decks and porches should have guardrails on the edges. Have any leaves, snow, or ice cleared regularly. Use sand or salt on walking paths during winter. Clean up any spills in your garage right away. This includes oil or grease spills. What can I do in the bathroom? Use night lights. Install grab bars by the toilet and in the tub and shower. Do not use towel bars as grab bars. Use non-skid mats or decals in the tub or shower. If you need to sit down in the shower, use a plastic, non-slip stool. Keep the floor dry. Clean up any water that spills on the floor as soon as it happens. Remove soap buildup in the tub or shower regularly. Attach bath mats securely with double-sided  non-slip rug tape. Do not have throw rugs and other things on the floor that can make you trip. What can I do in the bedroom? Use night lights. Make sure that you have a light by your bed that is easy to reach. Do not use any sheets or blankets that are too big for your bed. They should not hang down onto the floor. Have a firm chair that has side arms. You can use this for support while you get dressed. Do not have throw rugs and other things on the floor that can make you trip. What can I do in the kitchen? Clean up any spills right away. Avoid walking on wet floors. Keep items that you use a lot in easy-to-reach places. If you need to reach something above you, use a strong step stool that has a grab bar. Keep electrical cords out of the way. Do not use floor polish or wax that makes floors slippery. If you must use wax, use non-skid floor wax. Do not have throw rugs and other things on the floor that can make you trip. What can I do with my stairs? Do not leave any items on the stairs. Make sure that there are handrails on both sides of the stairs and use them. Fix handrails that are broken or loose. Make sure that handrails are as long as the stairways. Check any carpeting to make sure  that it is firmly attached to the stairs. Fix any carpet that is loose or worn. Avoid having throw rugs at the top or bottom of the stairs. If you do have throw rugs, attach them to the floor with carpet tape. Make sure that you have a light switch at the top of the stairs and the bottom of the stairs. If you do not have them, ask someone to add them for you. What else can I do to help prevent falls? Wear shoes that: Do not have high heels. Have rubber bottoms. Are comfortable and fit you well. Are closed at the toe. Do not wear sandals. If you use a stepladder: Make sure that it is fully opened. Do not climb a closed stepladder. Make sure that both sides of the stepladder are locked into place. Ask  someone to hold it for you, if possible. Clearly mark and make sure that you can see: Any grab bars or handrails. First and last steps. Where the edge of each step is. Use tools that help you move around (mobility aids) if they are needed. These include: Canes. Walkers. Scooters. Crutches. Turn on the lights when you go into a dark area. Replace any light bulbs as soon as they burn out. Set up your furniture so you have a clear path. Avoid moving your furniture around. If any of your floors are uneven, fix them. If there are any pets around you, be aware of where they are. Review your medicines with your doctor. Some medicines can make you feel dizzy. This can increase your chance of falling. Ask your doctor what other things that you can do to help prevent falls. This information is not intended to replace advice given to you by your health care provider. Make sure you discuss any questions you have with your health care provider. Document Released: 07/24/2009 Document Revised: 03/04/2016 Document Reviewed: 11/01/2014 Elsevier Interactive Patient Education  2017 [employer].

## 2021-09-11 NOTE — Assessment & Plan Note (Signed)
Baseline SOB symptoms significantly improved with maintenance regimen. Cough and hoarseness improved with abx therapy and second round of prednisone. Overall improvement. Continue with previously prescribed plan, finish abx and prednisone taper. Will keep planned follow up.   Patient Instructions  Continue breztri inhaler 2 puffs Twice daily, rinse after Continue albuterol neb 3 mL or inhaler 2 puffs every 6 hours as needed for shortness of breath or wheezing Continue loratidine 10 mg daily Continue singulair 10 mg At bedtime Continue omeprazole 40 mg daily for acid reflux Continue nasocort nasal spray 1 spray each nostril daily Continue saline nasal spray 2-3 times a day   Continue augmentin (antibiotic) Twice daily until finished. Take with food. Continue taking daily probiotic until you are finished with your antibiotic  Continue prednisone taper until finished.   Nystatin oral rinse 5 mL four times a day for thrush for 10 days. Ensure you are rinsing your mouth well after you use your Breztri inhaler.  Saline nasal gel into each nostril at night   Asthma Action Plan in place Notify if worsening breathlessness, cough, mucus production, fatigue, or wheezing occurs.  Maintain up to date vaccinations, including influenza, COVID, and pneumococcal.  Wash your hands often and avoid sick exposures.  Encouraged masking in crowds.  Avoid triggers, when possible.  Exercise, as tolerated. Notify if worsening symptoms upon exertion occur.    Follow up for your scheduled appointment later this month. If symptoms do not improve or worsen, please contact office for sooner follow up or seek emergency care.

## 2021-09-25 ENCOUNTER — Ambulatory Visit (INDEPENDENT_AMBULATORY_CARE_PROVIDER_SITE_OTHER): Payer: Medicare Other | Admitting: Internal Medicine

## 2021-09-25 ENCOUNTER — Encounter: Payer: Self-pay | Admitting: Internal Medicine

## 2021-09-25 ENCOUNTER — Other Ambulatory Visit: Payer: Self-pay

## 2021-09-25 VITALS — BP 130/80 | HR 77 | Ht 69.0 in | Wt 200.2 lb

## 2021-09-25 DIAGNOSIS — K219 Gastro-esophageal reflux disease without esophagitis: Secondary | ICD-10-CM

## 2021-09-25 DIAGNOSIS — J301 Allergic rhinitis due to pollen: Secondary | ICD-10-CM

## 2021-09-25 DIAGNOSIS — J449 Chronic obstructive pulmonary disease, unspecified: Secondary | ICD-10-CM | POA: Diagnosis not present

## 2021-09-25 NOTE — Patient Instructions (Addendum)
Please schedule follow up scheduled with myself in 3 months.  If my schedule is not open yet, we will contact you with a reminder closer to that time. Please call [phone] if you haven't heard from [place] a month before.    Continue [employer] with an alcohol based mouthwash after each time you use the breztri, morning and night. This is to prevent thrush.

## 2021-09-25 NOTE — Progress Notes (Signed)
Austin [name]    [id number]    Jul 22, 1941  Primary Care Physician:Parker, Algis Greenhouse, MD Date of Appointment: 09/25/2021 Established Patient Visit  Chief complaint:   Chief Complaint  Patient presents with   Follow-up    2 wk f/u. States his breathing has improved. Still has a productive cough. Wheezing has improved as well.     HPI: Austin [name] is a 80 y.o. man with history of asthma copd overlap syndrome.   Interval Updates: Here for follow up after seeing [place]. Switched from advair to [employer]. This seems to be helping. Treated with prednisone taper at sick visit. He is much better with breztri than with advair.   Having post nasal drainage but improved with nasacort.and prn nasal saline.   Still on PPI for reflux.   Current Regimen: breztri 2 puffs twice a day, albuterol Asthma Triggers: seasonal allergies Exacerbations in the last year: usually once/year. Most recently nov 2022 History of hospitalization or intubation: never Hives: denies Allergy Testing: never had.  GERD: yes on prilosec. On PPI omeprazole 40 mg daily Allergic Rhinitis: yes, on nasacort ACT:  Asthma Control Test ACT Total Score  01/21/2020 13   FeNO: 40 ppb  I have reviewed the patient's family social and past medical history and updated as appropriate.   Past Medical History:  Diagnosis Date   Asthma    Depression    Hypertension    Prostate cancer [facility])    Urinary incontinence     Past Surgical History:  Procedure Laterality Date   GALLBLADDER SURGERY  1997   REPLACEMENT TOTAL KNEE BILATERAL  2001   2007   surgery replacement Bilateral 2005    Family History  Problem Relation Age of Onset   Diabetes Brother    Heart disease Paternal Grandfather    Asthma Neg Hx     Social History   Occupational History   Occupation: retired  Tobacco Use   Smoking status: Former    Types: Cigars    Quit date: 10/22/2019    Years since quitting: 1.9   Smokeless tobacco: Never    Tobacco comments:    smoked occ. with son. did not tolerate it long. 12/14/19.  Vaping Use   Vaping Use: Never used  Substance and Sexual Activity   Alcohol use: Yes   Drug use: Never   Sexual activity: Not on file     Physical Exam: Blood pressure 130/80, pulse 77, height 5\' 9"  (1.753 m), weight 200 lb 3.2 oz (90.8 kg), SpO2 96 %.  Gen:      No acute distress ENT: no thrush Lungs:    diminished, no wheezes or crackles CV:         Regular rate and rhythm; no murmurs, rubs, or gallops.  No pedal edema   Data Reviewed: Imaging: I have personally reviewed the outside hospital records including pulmonary function tests and reports of CT chest from December 2019 which demonstrates air trapping, tracheobronchomalacia, mild bibasilar atelectasis  PFTs: Spirometry completed April 2021 shows moderately severe airflow limitation with an FEV1 53% of predicted.  I reviewed his PFTs from [facility] in [place] and these also demonstrated Airflow limitation with +BD response.   Labs:  Immunization status: Immunization History  Administered Date(s) Administered   Fluad Quad(high Dose 65+) 07/24/2019   Influenza, High Dose Seasonal PF 05/31/2021   Influenza-Unspecified 06/04/2020   PFIZER(Purple Top)SARS-COV-2 Vaccination 11/23/2019, 12/17/2019, 07/26/2020   PNEUMOCOCCAL CONJUGATE-20 02/10/2021  Assessment:  Asthma COPD overlap syndrome FEV1 53% of predicted. Improved control, recent exacerbation Elevated Exhaled NO.  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  continue nasacort continue PPI   Return to Care: Return in about 3 months (around 12/24/2021).    Lenice Llamas, MD Pulmonary and [place]

## 2021-09-30 ENCOUNTER — Ambulatory Visit: Payer: Medicare Other | Admitting: Nurse Practitioner

## 2021-10-15 ENCOUNTER — Other Ambulatory Visit: Payer: Self-pay | Admitting: Nurse Practitioner

## 2021-10-15 DIAGNOSIS — K219 Gastro-esophageal reflux disease without esophagitis: Secondary | ICD-10-CM

## 2021-11-11 ENCOUNTER — Other Ambulatory Visit: Payer: Self-pay | Admitting: Pulmonary Disease

## 2021-11-11 DIAGNOSIS — J449 Chronic obstructive pulmonary disease, unspecified: Secondary | ICD-10-CM

## 2021-11-16 ENCOUNTER — Ambulatory Visit: Payer: Medicare Other | Admitting: Family Medicine

## 2021-11-17 ENCOUNTER — Ambulatory Visit: Payer: Medicare Other | Admitting: Family Medicine

## 2021-11-23 ENCOUNTER — Encounter: Payer: Self-pay | Admitting: Family Medicine

## 2021-11-23 ENCOUNTER — Ambulatory Visit (INDEPENDENT_AMBULATORY_CARE_PROVIDER_SITE_OTHER): Payer: Medicare Other | Admitting: Family Medicine

## 2021-11-23 ENCOUNTER — Other Ambulatory Visit: Payer: Self-pay

## 2021-11-23 VITALS — BP 187/84 | HR 71 | Temp 97.9°F | Ht 69.0 in | Wt 197.6 lb

## 2021-11-23 DIAGNOSIS — E785 Hyperlipidemia, unspecified: Secondary | ICD-10-CM | POA: Diagnosis not present

## 2021-11-23 DIAGNOSIS — I10 Essential (primary) hypertension: Secondary | ICD-10-CM

## 2021-11-23 DIAGNOSIS — R739 Hyperglycemia, unspecified: Secondary | ICD-10-CM | POA: Diagnosis not present

## 2021-11-23 LAB — CBC
HCT: 43.5 % (ref 39.0–52.0)
Hemoglobin: 14.5 g/dL (ref 13.0–17.0)
MCHC: 33.4 g/dL (ref 30.0–36.0)
MCV: 97 fl (ref 78.0–100.0)
Platelets: 249 10*3/uL (ref 150.0–400.0)
RBC: 4.48 Mil/uL (ref 4.22–5.81)
RDW: 13.8 % (ref 11.5–15.5)
WBC: 5.3 10*3/uL (ref 4.0–10.5)

## 2021-11-23 LAB — TSH: TSH: 1.35 u[IU]/mL (ref 0.35–5.50)

## 2021-11-23 LAB — HEMOGLOBIN A1C: Hgb A1c MFr Bld: 6.2 % (ref 4.6–6.5)

## 2021-11-23 MED ORDER — AMLODIPINE BESYLATE 5 MG PO TABS: 5.0000 mg | ORAL_TABLET | Freq: Every day | ORAL | 3 refills | Status: DC

## 2021-11-23 NOTE — Assessment & Plan Note (Signed)
No longer on metformin.  Recheck A1c today.

## 2021-11-23 NOTE — Progress Notes (Signed)
° °  Austin [name] is a 81 y.o. male who presents today for an office visit.  Assessment/Plan:  Chronic Problems Addressed Today: Essential hypertension Very elevated today.  He has typically been well controlled.  Could be due to sodium intake.  He has also been taking several cough medications with could be contributing though he has been off of these for a couple of weeks.    No signs of endorgan damage.  We will check labs today.  Continue losartan 100 mg daily.  Start amlodipine 5 mg daily.  He will follow-up with me in 1 to 2 weeks for blood pressure recheck.  We discussed importance of regular exercise and salt avoidance.  Hyperglycemia No longer on metformin.  Recheck A1c today.  Dyslipidemia On Lipitor 80 mg daily and fenofibrate 200 mg daily.  Check lipids.    Subjective:  HPI:  Patient here for blood pressure check.  He has noticed home elevated readings in the 170s and 180s over the last several weeks.  He has been dealing with cold and URI symptoms.  Has been taking several over-the-counter meds.  No other recent changes.  No reported chest pain.  No headache.       Objective:  Physical Exam: BP (!) 187/84    Pulse 71    Temp 97.9 F (36.6 C) (Temporal)    Ht 5\' 9"  (1.753 m)    Wt 197 lb 9.6 oz (89.6 kg)    SpO2 95%    BMI 29.18 kg/m   Gen: No acute distress, resting comfortably CV: Regular rate and rhythm with no murmurs appreciated Pulm: Normal work of breathing, clear to auscultation bilaterally with no crackles, wheezes, or rhonchi Neuro: Grossly normal, moves all extremities Psych: Normal affect and thought content      Tamicka Shimon M. Jerline Pain, MD 11/23/2021 11:59 AM

## 2021-11-23 NOTE — Assessment & Plan Note (Signed)
On Lipitor 80 mg daily and fenofibrate 200 mg daily.  Check lipids.

## 2021-11-23 NOTE — Assessment & Plan Note (Signed)
Very elevated today.  He has typically been well controlled.  Could be due to sodium intake.  He has also been taking several cough medications with could be contributing though he has been off of these for a couple of weeks.    No signs of endorgan damage.  We will check labs today.  Continue losartan 100 mg daily.  Start amlodipine 5 mg daily.  He will follow-up with me in 1 to 2 weeks for blood pressure recheck.  We discussed importance of regular exercise and salt avoidance.

## 2021-11-23 NOTE — Patient Instructions (Signed)
It was very nice to see you today!  Your blood pressure is very elevated today.  Please start the amlodipine.  Please continue to stay active and cut down on your sodium.  We will check blood work today.  Please come back to see me in 1 to 2 weeks for blood pressure recheck.  Take care, Dr Jerline Pain  PLEASE NOTE:  If you had any lab tests please let us know if you have not heard back within a few days. You may see your results on mychart before we have a chance to review them but we will give you a call once they are reviewed by [place]. If we ordered any referrals today, please let us know if you have not heard from their office within the next week.   Please try these tips to maintain a healthy lifestyle:  Eat at least 3 REAL meals and 1-2 snacks per day.  Aim for no more than 5 hours between eating.  If you eat breakfast, please do so within one hour of getting up.   Each meal should contain half fruits/vegetables, one quarter protein, and one quarter carbs (no bigger than a computer mouse)  Cut down on sweet beverages. This includes juice, soda, and sweet tea.   Drink at least 1 glass of water with each meal and aim for at least 8 glasses per day  Exercise at least 150 minutes every week.

## 2021-11-24 LAB — COMPREHENSIVE METABOLIC PANEL
ALT: 16 U/L (ref 0–53)
AST: 16 U/L (ref 0–37)
Albumin: 4.1 g/dL (ref 3.5–5.2)
Alkaline Phosphatase: 76 U/L (ref 39–117)
BUN: 17 mg/dL (ref 6–23)
CO2: 31 mEq/L (ref 19–32)
Calcium: 9.7 mg/dL (ref 8.4–10.5)
Chloride: 106 mEq/L (ref 96–112)
Creatinine, Ser: 0.68 mg/dL (ref 0.40–1.50)
GFR: 87.5 mL/min (ref >60.00)
Glucose, Bld: 94 mg/dL (ref 70–99)
Potassium: 4.1 mEq/L (ref 3.5–5.1)
Sodium: 143 mEq/L (ref 135–145)
Total Bilirubin: 0.4 mg/dL (ref 0.2–1.2)
Total Protein: 7.4 g/dL (ref 6.0–8.3)

## 2021-11-24 LAB — LIPID PANEL
Cholesterol: 156 mg/dL (ref 0–200)
HDL: 53.4 mg/dL (ref >39.00)
LDL Cholesterol: 90 mg/dL (ref 0–99)
NonHDL: 102.22
Total CHOL/HDL Ratio: 3
Triglycerides: 63 mg/dL (ref 0.0–149.0)
VLDL: 12.6 mg/dL (ref 0.0–40.0)

## 2021-11-25 NOTE — Progress Notes (Signed)
Please inform patient of the following:  Labs are all STABLE. Do not need to make any changes to his treatment plan at this time. We can recheck in 6 months.  Austin [name]. Jerline Pain, MD 11/25/2021 8:07 AM

## 2021-12-07 ENCOUNTER — Ambulatory Visit (INDEPENDENT_AMBULATORY_CARE_PROVIDER_SITE_OTHER): Payer: Medicare Other | Admitting: Family Medicine

## 2021-12-07 ENCOUNTER — Other Ambulatory Visit: Payer: Self-pay

## 2021-12-07 VITALS — BP 152/77 | HR 71 | Temp 98.1°F | Ht 69.0 in | Wt 196.4 lb

## 2021-12-07 DIAGNOSIS — J302 Other seasonal allergic rhinitis: Secondary | ICD-10-CM

## 2021-12-07 DIAGNOSIS — J449 Chronic obstructive pulmonary disease, unspecified: Secondary | ICD-10-CM | POA: Diagnosis not present

## 2021-12-07 DIAGNOSIS — I10 Essential (primary) hypertension: Secondary | ICD-10-CM | POA: Diagnosis not present

## 2021-12-07 NOTE — Assessment & Plan Note (Signed)
Slightly above goal today, though much better than last time.  We will continue amlodipine 5 mg daily and losartan 100 mg daily.  He will continue home monitoring.  Follow-up in 3 to 6 months.

## 2021-12-07 NOTE — Progress Notes (Signed)
° °  Austin [name] is a 81 y.o. male who presents today for an office visit.  Assessment/Plan:  Chronic Problems Addressed Today: Seasonal allergic rhinitis He is currently on Claritin to Singulair.  He is having some eye irritation as well.  We will add on Pataday drops.  Recommended he follow-up with ophthalmology soon.  Essential hypertension Slightly above goal today, though much better than last time.  We will continue amlodipine 5 mg daily and losartan 100 mg daily.  He will continue home monitoring.  Follow-up in 3 to 6 months.     Subjective:  HPI:  Patient here for follow up. Was seen a couple of weeks ago. At that time had elevated BP and we added on amlodipine 5mg  daily. He has done well without any significant side effects.  BP have been "up and down" at home. Ranging from 130s/70s to 150s/80s.   He is having some irritation in his eyes.  He will be following up with the eye doctor soon.  Is also had ongoing wheezing and cough.  Will follow up with pulmonology in a few weeks.       Objective:  Physical Exam: BP (!) 152/77 (BP Location: Left Arm)    Pulse 71    Temp 98.1 F (36.7 C) (Temporal)    Ht 5\' 9"  (1.753 m)    Wt 196 lb 6.4 oz (89.1 kg)    SpO2 99%    BMI 29.00 kg/m   Gen: No acute distress, resting comfortably HEENT: Slight conjunctival erythema noted bilaterally with watery discharge.  Extraocular eye movements intact without pain. CV: Regular rate and rhythm with no murmurs appreciated Pulm: Normal work of breathing, clear to auscultation bilaterally with no crackles, wheezes, or rhonchi Neuro: Grossly normal, moves all extremities Psych: Normal affect and thought content      Kayhan Boardley M. Jerline Pain, MD 12/07/2021 10:32 AM

## 2021-12-07 NOTE — Patient Instructions (Signed)
It was very nice to see you today!  Please try using pataday drops for your eyes.   Your blood pressure looks much better today. Please continue to monitor at home and let me know if persistently 150/90 or higher.   I will see you back in 3-6 months. Come back sooner if needed.   Take care, Dr Jerline Pain  PLEASE NOTE:  If you had any lab tests please let us know if you have not heard back within a few days. You may see your results on mychart before we have a chance to review them but we will give you a call once they are reviewed by [place]. If we ordered any referrals today, please let us know if you have not heard from their office within the next week.   Please try these tips to maintain a healthy lifestyle:  Eat at least 3 REAL meals and 1-2 snacks per day.  Aim for no more than 5 hours between eating.  If you eat breakfast, please do so within one hour of getting up.   Each meal should contain half fruits/vegetables, one quarter protein, and one quarter carbs (no bigger than a computer mouse)  Cut down on sweet beverages. This includes juice, soda, and sweet tea.   Drink at least 1 glass of water with each meal and aim for at least 8 glasses per day  Exercise at least 150 minutes every week.

## 2021-12-07 NOTE — Assessment & Plan Note (Signed)
He is currently on Claritin to Singulair.  He is having some eye irritation as well.  We will add on Pataday drops.  Recommended he follow-up with ophthalmology soon.

## 2021-12-15 ENCOUNTER — Ambulatory Visit: Payer: Medicare Other | Admitting: Internal Medicine

## 2021-12-18 ENCOUNTER — Other Ambulatory Visit: Payer: Self-pay

## 2021-12-18 ENCOUNTER — Encounter: Payer: Self-pay | Admitting: Internal Medicine

## 2021-12-18 ENCOUNTER — Ambulatory Visit (INDEPENDENT_AMBULATORY_CARE_PROVIDER_SITE_OTHER): Payer: Medicare Other | Admitting: Internal Medicine

## 2021-12-18 VITALS — BP 140/84 | HR 91 | Temp 97.8°F | Ht 69.0 in | Wt 199.4 lb

## 2021-12-18 DIAGNOSIS — J4541 Moderate persistent asthma with (acute) exacerbation: Secondary | ICD-10-CM | POA: Diagnosis not present

## 2021-12-18 DIAGNOSIS — J302 Other seasonal allergic rhinitis: Secondary | ICD-10-CM | POA: Diagnosis not present

## 2021-12-18 LAB — CBC WITH DIFFERENTIAL/PLATELET
Basophils Absolute: 0 10*3/uL (ref 0.0–0.1)
Basophils Relative: 0.4 % (ref 0.0–3.0)
Eosinophils Absolute: 0.1 10*3/uL (ref 0.0–0.7)
Eosinophils Relative: 2.2 % (ref 0.0–5.0)
HCT: 43.3 % (ref 39.0–52.0)
Hemoglobin: 14.4 g/dL (ref 13.0–17.0)
Lymphocytes Relative: 21.9 % (ref 12.0–46.0)
Lymphs Abs: 1.5 10*3/uL (ref 0.7–4.0)
MCHC: 33.3 g/dL (ref 30.0–36.0)
MCV: 98.5 fl (ref 78.0–100.0)
Monocytes Absolute: 0.7 10*3/uL (ref 0.1–1.0)
Monocytes Relative: 10.5 % (ref 3.0–12.0)
Neutro Abs: 4.4 10*3/uL (ref 1.4–7.7)
Neutrophils Relative %: 65 % (ref 43.0–77.0)
Platelets: 239 10*3/uL (ref 150.0–400.0)
RBC: 4.4 Mil/uL (ref 4.22–5.81)
RDW: 14 % (ref 11.5–15.5)
WBC: 6.8 10*3/uL (ref 4.0–10.5)

## 2021-12-18 MED ORDER — LORATADINE 10 MG PO TABS: 10.0000 mg | ORAL_TABLET | Freq: Every day | ORAL | 3 refills | Status: DC

## 2021-12-18 MED ORDER — PREDNISONE 20 MG PO TABS: 40.0000 mg | ORAL_TABLET | Freq: Every day | ORAL | 0 refills | Status: DC

## 2021-12-18 MED ORDER — BREZTRI AEROSPHERE 160-9-4.8 MCG/ACT IN AERO: 2.0000 | INHALATION_SPRAY | Freq: Two times a day (BID) | RESPIRATORY_TRACT | 5 refills | Status: DC

## 2021-12-18 NOTE — Patient Instructions (Addendum)
Please schedule follow up scheduled with myself in 4 months.  If my schedule is not open yet, we will contact you with a reminder closer to that time. Please call [phone] if you haven't heard from [place] a month before.  ? ?Get blood work done today for testing for allergens. ? ?Start taking prednisone 40 mg once daily for 5 days.  ? ?I have sent breztri to your pharmacy - 2 puffs twice a day, gargle after use.  ? ?Continue albuterol as needed for shortness of breath. ? ?Continue claritin and singulair for allergies.  ?

## 2021-12-18 NOTE — Progress Notes (Signed)
? ?      Austin [name]    [id number]    Jan 04, 1941 ? ?Primary Care Physician:Parker, Algis Greenhouse, MD ?Date of Appointment: 12/18/2021 ?Established Patient Visit ? ?Chief complaint:   ?Chief Complaint  ?Patient presents with  ? Follow-up  ?  Pt states he is still coughing and also has had complaints of wheezing.  ? ? ?HPI: ?Austin [name] is a 81 y.o. man with history of asthma copd overlap syndrome.  ? ?Interval Updates: ?He only took samples of breztri. Never filled a prescription.  ?Has been taking albuterol more frequently only. Now feeling worsening chest tightness and wheezing.  ? ?Having post nasal drainage but improved with nasacort.and prn nasal saline.  ? ?Still on PPI for reflux.  ? ?Current Regimen: breztri 2 puffs twice a day, albuterol ?Asthma Triggers: seasonal allergies ?Exacerbations in the last year: usually once/year. Most recently nov 2022 ?History of hospitalization or intubation: never ?Hives: denies ?Allergy Testing: never had.  ?GERD: yes on prilosec. On PPI omeprazole 40 mg daily ?Allergic Rhinitis: yes, on nasacort ?ACT:  ?Asthma Control Test ACT Total Score  ?12/18/2021 18  ?01/21/2020 13  ? ?FeNO: 40 ppb ? ?I have reviewed the patient's family social and past medical history and updated as appropriate.  ? ?Past Medical History:  ?Diagnosis Date  ? Asthma   ? Depression   ? Hypertension   ? Prostate cancer ([place])   ? Urinary incontinence   ? ? ?Past Surgical History:  ?Procedure Laterality Date  ? GALLBLADDER SURGERY  1997  ? REPLACEMENT TOTAL KNEE BILATERAL  2001  ? 2007  ? surgery replacement Bilateral 2005  ? ? ?Family History  ?Problem Relation Age of Onset  ? Diabetes Brother   ? Heart disease Paternal Grandfather   ? Asthma Neg Hx   ? ? ?Social History  ? ?Occupational History  ? Occupation: retired  ?Tobacco Use  ? Smoking status: Former  ?  Types: Cigars  ?  Quit date: 10/22/2019  ?  Years since quitting: 2.1  ? Smokeless tobacco: Never  ? Tobacco comments:  ?  smoked occ. with son.  did not tolerate it long. 12/14/19.  ?Vaping Use  ? Vaping Use: Never used  ?Substance and Sexual Activity  ? Alcohol use: Yes  ? Drug use: Never  ? Sexual activity: Not on file  ? ? ? ?Physical Exam: ?Blood pressure 140/84, pulse 91, temperature 97.8 ?F (36.6 ?C), temperature source Oral, height '5\' 9"'$  (1.753 m), weight 199 lb 6.4 oz (90.4 kg), SpO2 94 %. ? ?Gen:      No acute distress, able to complete full sentences ?Lungs:    end expiratory wheezing ?CV:         systolic murmur, rrr otherwise ? ? ?Data Reviewed: ?Imaging: ?I have personally reviewed the outside hospital records including pulmonary function tests and reports of CT chest from December 2019 which demonstrates air trapping, tracheobronchomalacia, mild bibasilar atelectasis ? ?PFTs: ?Spirometry completed April 2021 shows moderately severe airflow limitation with an FEV1 53% of predicted.  ?I reviewed his PFTs from [facility] in [place] and these also demonstrated Airflow limitation with +BD response.  ? ?Labs: ? ?Immunization status: ?Immunization History  ?Administered Date(s) Administered  ? Fluad Quad(high Dose 65+) 07/24/2019  ? Influenza, High Dose Seasonal PF 05/31/2021  ? Influenza-Unspecified 06/04/2020  ? PFIZER(Purple Top)SARS-COV-2 Vaccination 11/23/2019, 12/17/2019, 07/26/2020  ? PNEUMOCOCCAL CONJUGATE-20 02/10/2021  ? ? ?Assessment:  ?Asthma COPD overlap syndrome FEV1 53% of predicted.not well controlled  likely due to medication non-adherence - with exacerbation ?Elevated Exhaled NO.  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 ?Plan/Recommendations: ?Prednisone 40 mg x 5 days today ?Start bretzri - gargle with etoh based mouthwash.  ?continue nasacort ?continue PPI ?Will obtain region 2 allergy panel and cbc with diff for asthma phenotyping.  ? ? ?Return to Care: ?Return in about 4 months (around 04/19/2022). ? ? ? ?Lenice Llamas, MD ?Pulmonary and Critical  Care Medicine ?[place] ?Office:[phone] ? ? ? ? ? ?

## 2021-12-22 LAB — RESPIRATORY ALLERGY PROFILE REGION II ~~LOC~~

## 2021-12-22 LAB — INTERPRETATION:

## 2022-01-10 ENCOUNTER — Other Ambulatory Visit: Payer: Self-pay | Admitting: Family Medicine

## 2022-01-23 ENCOUNTER — Other Ambulatory Visit: Payer: Self-pay | Admitting: Nurse Practitioner

## 2022-01-23 DIAGNOSIS — K219 Gastro-esophageal reflux disease without esophagitis: Secondary | ICD-10-CM

## 2022-01-24 ENCOUNTER — Other Ambulatory Visit: Payer: Self-pay | Admitting: Family Medicine

## 2022-01-26 ENCOUNTER — Other Ambulatory Visit: Payer: Self-pay | Admitting: Family Medicine

## 2022-02-17 ENCOUNTER — Other Ambulatory Visit: Payer: Self-pay | Admitting: Nurse Practitioner

## 2022-02-17 DIAGNOSIS — J301 Allergic rhinitis due to pollen: Secondary | ICD-10-CM

## 2022-02-19 ENCOUNTER — Other Ambulatory Visit: Payer: Self-pay | Admitting: Internal Medicine

## 2022-02-19 ENCOUNTER — Other Ambulatory Visit: Payer: Self-pay | Admitting: Pulmonary Disease

## 2022-02-19 ENCOUNTER — Other Ambulatory Visit: Payer: Self-pay | Admitting: Family Medicine

## 2022-02-19 DIAGNOSIS — J449 Chronic obstructive pulmonary disease, unspecified: Secondary | ICD-10-CM

## 2022-02-19 DIAGNOSIS — J439 Emphysema, unspecified: Secondary | ICD-10-CM

## 2022-02-19 MED ORDER — ALBUTEROL SULFATE HFA 108 (90 BASE) MCG/ACT IN AERS: INHALATION_SPRAY | RESPIRATORY_TRACT | 5 refills | Status: DC

## 2022-02-23 ENCOUNTER — Other Ambulatory Visit: Payer: Self-pay

## 2022-02-23 MED ORDER — LOSARTAN POTASSIUM 100 MG PO TABS: 100.0000 mg | ORAL_TABLET | Freq: Every day | ORAL | 3 refills | Status: DC

## 2022-02-23 NOTE — Telephone Encounter (Signed)
Patient states his pharmacy has requested a refill from Dr. Jerline Pain on Losartan.  I do not see medication on patients med list.  Please follow up with patient in regard.

## 2022-02-23 NOTE — Telephone Encounter (Signed)
Historical provider.  

## 2022-02-26 ENCOUNTER — Other Ambulatory Visit: Payer: Self-pay

## 2022-02-26 MED ORDER — ALBUTEROL SULFATE (2.5 MG/3ML) 0.083% IN NEBU: INHALATION_SOLUTION | RESPIRATORY_TRACT | 0 refills | Status: DC

## 2022-03-16 ENCOUNTER — Ambulatory Visit (INDEPENDENT_AMBULATORY_CARE_PROVIDER_SITE_OTHER): Payer: Medicare Other | Admitting: Family Medicine

## 2022-03-16 ENCOUNTER — Encounter: Payer: Self-pay | Admitting: Family Medicine

## 2022-03-16 VITALS — BP 165/85 | HR 83 | Temp 98.4°F | Ht 69.0 in | Wt 200.2 lb

## 2022-03-16 DIAGNOSIS — F321 Major depressive disorder, single episode, moderate: Secondary | ICD-10-CM | POA: Diagnosis not present

## 2022-03-16 DIAGNOSIS — I10 Essential (primary) hypertension: Secondary | ICD-10-CM

## 2022-03-16 DIAGNOSIS — G4700 Insomnia, unspecified: Secondary | ICD-10-CM

## 2022-03-16 MED ORDER — ZOLPIDEM TARTRATE 5 MG PO TABS: 5.0000 mg | ORAL_TABLET | Freq: Every evening | ORAL | 1 refills | Status: DC | PRN

## 2022-03-16 NOTE — Assessment & Plan Note (Signed)
Multifactorial.  He has been on Ambien in the past and has done reasonably well with this.  He is interested in restarting.  Has difficulty staying asleep.  We discussed alternatives such as trazodone however he would like to try Ambien again.  This would be reasonable given his previous good experience with this.  We will send in today.  He is aware this is not to be a long-term medication.  We will follow-up in a few weeks via MyChart.

## 2022-03-16 NOTE — Assessment & Plan Note (Signed)
Stable on Lexapro 20 mg daily.  Still has some issues with motivation.  May be interested in switching medications however he deferred for today.  We will follow-up again in a few weeks and we can consider switching to or adding on Wellbutrin.

## 2022-03-16 NOTE — Patient Instructions (Addendum)
It was very nice to see you today!  Please keep an eye on your blood pressure and let us know if it is persistently elevated to 140/90 or higher.  We will refill your Ambien today.  Please let me know if you have any side effects.  We will see back in a few weeks and we can recheck blood work at that time.  Take care, Dr Jerline Pain  PLEASE NOTE:  If you had any lab tests please let us know if you have not heard back within a few days. You may see your results on mychart before we have a chance to review them but we will give you a call once they are reviewed by [place]. If we ordered any referrals today, please let us know if you have not heard from their office within the next week.   Please try these tips to maintain a healthy lifestyle:  Eat at least 3 REAL meals and 1-2 snacks per day.  Aim for no more than 5 hours between eating.  If you eat breakfast, please do so within one hour of getting up.   Each meal should contain half fruits/vegetables, one quarter protein, and one quarter carbs (no bigger than a computer mouse)  Cut down on sweet beverages. This includes juice, soda, and sweet tea.   Drink at least 1 glass of water with each meal and aim for at least 8 glasses per day  Exercise at least 150 minutes every week.

## 2022-03-16 NOTE — Assessment & Plan Note (Signed)
Elevated today though his home readings are typically goal in the 130s to 140s over 80s.  He is tolerating his medications well.  We will continue to monitor at home for the next few weeks and he will follow-up with me in a few weeks.  We can recheck labs at that point as well.  We will continue amlodipine 5 mg daily and losartan 100 mg daily.  If continues to be elevated over the next several weeks we will likely increase his dose of amlodipine to 10 mg daily.

## 2022-03-16 NOTE — Progress Notes (Signed)
   Austin [name] is a 81 y.o. male who presents today for an office visit.  Assessment/Plan:  Chronic Problems Addressed Today: Insomnia Multifactorial.  He has been on Ambien in the past and has done reasonably well with this.  He is interested in restarting.  Has difficulty staying asleep.  We discussed alternatives such as trazodone however he would like to try Ambien again.  This would be reasonable given his previous good experience with this.  We will send in today.  He is aware this is not to be a long-term medication.  We will follow-up in a few weeks via MyChart.  Depression, major, single episode, moderate (HCC) Stable on Lexapro 20 mg daily.  Still has some issues with motivation.  May be interested in switching medications however he deferred for today.  We will follow-up again in a few weeks and we can consider switching to or adding on Wellbutrin.  Essential hypertension Elevated today though his home readings are typically goal in the 130s to 140s over 80s.  He is tolerating his medications well.  We will continue to monitor at home for the next few weeks and he will follow-up with me in a few weeks.  We can recheck labs at that point as well.  We will continue amlodipine 5 mg daily and losartan 100 mg daily.  If continues to be elevated over the next several weeks we will likely increase his dose of amlodipine to 10 mg daily.     Subjective:  HPI:  See A/p for status of chronic conditions.         Objective:  Physical Exam: BP (!) 165/85   Pulse 83   Temp 98.4 F (36.9 C) (Temporal)   Ht '5\' 9"'$  (1.753 m)   Wt 200 lb 3.2 oz (90.8 kg)   SpO2 95%   BMI 29.56 kg/m   Gen: No acute distress, resting comfortably CV: Regular rate and rhythm with no murmurs appreciated Pulm: Normal work of breathing, clear to auscultation bilaterally with no crackles, wheezes, or rhonchi Neuro: Grossly normal, moves all extremities Psych: Normal affect and thought content      Doyl Bitting  M. Jerline Pain, MD 03/16/2022 1:27 PM

## 2022-03-22 ENCOUNTER — Ambulatory Visit: Payer: Medicare Other | Admitting: Family Medicine

## 2022-04-06 ENCOUNTER — Ambulatory Visit: Payer: Medicare Other | Admitting: Family Medicine

## 2022-04-16 ENCOUNTER — Ambulatory Visit: Payer: Medicare Other | Admitting: Family Medicine

## 2022-04-19 ENCOUNTER — Telehealth: Payer: Self-pay | Admitting: Family Medicine

## 2022-04-19 NOTE — Telephone Encounter (Signed)
See note  Patient has OV on 04/26/2022

## 2022-04-19 NOTE — Telephone Encounter (Signed)
Ok with me. Please place any necessary orders. 

## 2022-04-19 NOTE — Telephone Encounter (Signed)
Spouse is calling stating that patient would like to join the fitness center at their local senior center. Is requesting a letter to be completed stating patient is able to join the fitness center.

## 2022-04-23 ENCOUNTER — Ambulatory Visit (INDEPENDENT_AMBULATORY_CARE_PROVIDER_SITE_OTHER): Payer: Medicare Other | Admitting: Family Medicine

## 2022-04-23 ENCOUNTER — Encounter: Payer: Self-pay | Admitting: Family Medicine

## 2022-04-23 VITALS — BP 130/80 | HR 72 | Temp 97.8°F | Ht 69.0 in | Wt 198.6 lb

## 2022-04-23 DIAGNOSIS — R739 Hyperglycemia, unspecified: Secondary | ICD-10-CM

## 2022-04-23 DIAGNOSIS — E559 Vitamin D deficiency, unspecified: Secondary | ICD-10-CM

## 2022-04-23 DIAGNOSIS — C61 Malignant neoplasm of prostate: Secondary | ICD-10-CM

## 2022-04-23 DIAGNOSIS — I10 Essential (primary) hypertension: Secondary | ICD-10-CM

## 2022-04-23 DIAGNOSIS — G4700 Insomnia, unspecified: Secondary | ICD-10-CM

## 2022-04-23 DIAGNOSIS — E538 Deficiency of other specified B group vitamins: Secondary | ICD-10-CM

## 2022-04-23 DIAGNOSIS — J302 Other seasonal allergic rhinitis: Secondary | ICD-10-CM

## 2022-04-23 DIAGNOSIS — E785 Hyperlipidemia, unspecified: Secondary | ICD-10-CM

## 2022-04-23 LAB — COMPREHENSIVE METABOLIC PANEL
ALT: 15 U/L (ref 0–53)
AST: 16 U/L (ref 0–37)
Albumin: 4.1 g/dL (ref 3.5–5.2)
Alkaline Phosphatase: 57 U/L (ref 39–117)
BUN: 17 mg/dL (ref 6–23)
CO2: 31 mEq/L (ref 19–32)
Calcium: 9.3 mg/dL (ref 8.4–10.5)
Chloride: 103 mEq/L (ref 96–112)
Creatinine, Ser: 0.66 mg/dL (ref 0.40–1.50)
GFR: 88.04 mL/min (ref >60.00)
Glucose, Bld: 111 mg/dL — ABNORMAL HIGH (ref 70–99)
Potassium: 4.2 mEq/L (ref 3.5–5.1)
Sodium: 141 mEq/L (ref 135–145)
Total Bilirubin: 0.6 mg/dL (ref 0.2–1.2)
Total Protein: 7 g/dL (ref 6.0–8.3)

## 2022-04-23 LAB — CBC
HCT: 43 % (ref 39.0–52.0)
Hemoglobin: 14.2 g/dL (ref 13.0–17.0)
MCHC: 33 g/dL (ref 30.0–36.0)
MCV: 99.9 fl (ref 78.0–100.0)
Platelets: 236 10*3/uL (ref 150.0–400.0)
RBC: 4.3 Mil/uL (ref 4.22–5.81)
RDW: 14.1 % (ref 11.5–15.5)
WBC: 4.9 10*3/uL (ref 4.0–10.5)

## 2022-04-23 LAB — LIPID PANEL
Cholesterol: 202 mg/dL — ABNORMAL HIGH (ref 0–200)
HDL: 50.1 mg/dL (ref >39.00)
LDL Cholesterol: 133 mg/dL — ABNORMAL HIGH (ref 0–99)
NonHDL: 151.47
Total CHOL/HDL Ratio: 4
Triglycerides: 93 mg/dL (ref 0.0–149.0)
VLDL: 18.6 mg/dL (ref 0.0–40.0)

## 2022-04-23 LAB — HEMOGLOBIN A1C: Hgb A1c MFr Bld: 6.1 % (ref 4.6–6.5)

## 2022-04-23 LAB — PSA: PSA: 5.49 ng/mL — ABNORMAL HIGH (ref 0.10–4.00)

## 2022-04-23 LAB — VITAMIN B12: Vitamin B-12: 601 pg/mL (ref 211–911)

## 2022-04-23 LAB — TSH: TSH: 2.4 u[IU]/mL (ref 0.35–5.50)

## 2022-04-23 LAB — VITAMIN D 25 HYDROXY (VIT D DEFICIENCY, FRACTURES): VITD: 42.46 ng/mL (ref 30.00–100.00)

## 2022-04-23 MED ORDER — ESZOPICLONE 3 MG PO TABS
3.0000 mg | ORAL_TABLET | Freq: Every day | ORAL | 2 refills | Status: DC
Start: 2022-04-23 — End: 2022-07-26

## 2022-04-23 NOTE — Assessment & Plan Note (Signed)
Check PSA. ?

## 2022-04-23 NOTE — Progress Notes (Signed)
   Amyr [name] is a 81 y.o. male who presents today for an office visit.  Assessment/Plan:  Chronic Problems Addressed Today: Essential hypertension Has whitecoat hypertension.  At goal today on recheck Home readings are at goal and typically in the 120s to 130s over 80s.  We will continue current regimen Norvasc 5 mg daily and losartan 100 mg daily.  He will continue to monitor at home and let us know if persistently elevated.  Vitamin B12 deficiency On B12 supplementation.  We will check B12 level today.  Insomnia Did not have adequate response to Ambien.  No side effects we will switch Ambien to Lunesta 3 mg nightly.  He is aware of potential side effects.  He will follow-up in a few weeks via MyChart.  Dyslipidemia Check lipids.  Continue Lipitor 80 mg daily.  Prostate cancer [facility]) Check PSA.   Vitamin D deficiency Check Vitamin D  Hyperglycemia Check A1c.   Seasonal allergic rhinitis Still having issues despite Claritin, Singulair, and Nasacort.  He will let me know if he needs a referral to see ENT for further evaluation.     Subjective:  HPI:  See A/p for status of chronic conditions.         Objective:  Physical Exam: BP (!) 150/80   Pulse 72   Temp 97.8 F (36.6 C) (Temporal)   Ht '5\' 9"'$  (1.753 m)   Wt 198 lb 9.6 oz (90.1 kg)   SpO2 94%   BMI 29.33 kg/m   Gen: No acute distress, resting comfortably CV: Regular rate and rhythm with no murmurs appreciated Pulm: Normal work of breathing, clear to auscultation bilaterally with no crackles, wheezes, or rhonchi Neuro: Grossly normal, moves all extremities Psych: Normal affect and thought content      Latondra Gebhart M. Jerline Pain, MD 04/23/2022 9:33 AM

## 2022-04-23 NOTE — Assessment & Plan Note (Signed)
Check A1c. 

## 2022-04-23 NOTE — Assessment & Plan Note (Signed)
Did not have adequate response to Ambien.  No side effects we will switch Ambien to Lunesta 3 mg nightly.  He is aware of potential side effects.  He will follow-up in a few weeks via MyChart.

## 2022-04-23 NOTE — Assessment & Plan Note (Signed)
Check Vitamin D.  

## 2022-04-23 NOTE — Assessment & Plan Note (Signed)
Check lipids.  Continue Lipitor 80 mg daily. 

## 2022-04-23 NOTE — Patient Instructions (Addendum)
It was very nice to see you today!  We will maintain your current blood pressure medications.  Please continue to monitor at home and let me know if it is persistently 140/90 at home.  We will check blood work today.  We will try lunesta.  Please let me know in a few weeks how this is working.  I will see back in 6 months.  Please come back to see me sooner if needed.  Take care, Dr Jerline Pain  PLEASE NOTE:  If you had any lab tests please let us know if you have not heard back within a few days. You may see your results on mychart before we have a chance to review them but we will give you a call once they are reviewed by [place]. If we ordered any referrals today, please let us know if you have not heard from their office within the next week.   Please try these tips to maintain a healthy lifestyle:  Eat at least 3 REAL meals and 1-2 snacks per day.  Aim for no more than 5 hours between eating.  If you eat breakfast, please do so within one hour of getting up.   Each meal should contain half fruits/vegetables, one quarter protein, and one quarter carbs (no bigger than a computer mouse)  Cut down on sweet beverages. This includes juice, soda, and sweet tea.   Drink at least 1 glass of water with each meal and aim for at least 8 glasses per day  Exercise at least 150 minutes every week.

## 2022-04-23 NOTE — Assessment & Plan Note (Addendum)
Has whitecoat hypertension.  At goal today on recheck Home readings are at goal and typically in the 120s to 130s over 80s.  We will continue current regimen Norvasc 5 mg daily and losartan 100 mg daily.  He will continue to monitor at home and let us know if persistently elevated.

## 2022-04-23 NOTE — Telephone Encounter (Signed)
Letter done

## 2022-04-23 NOTE — Assessment & Plan Note (Signed)
Still having issues despite Claritin, Singulair, and Nasacort.  He will let me know if he needs a referral to see ENT for further evaluation.

## 2022-04-23 NOTE — Assessment & Plan Note (Signed)
On B12 supplementation.  We will check B12 level today.

## 2022-04-26 NOTE — Progress Notes (Signed)
Please inform patient of the following:  His PSA is trending up.  Recommend referral to urology.  Blood sugar and cholesterol both borderline but stable.  Everything else was normal.  Would like for him to continue to work on diet and exercise and we can recheck in a year.

## 2022-04-27 ENCOUNTER — Other Ambulatory Visit: Payer: Self-pay | Admitting: *Deleted

## 2022-04-27 DIAGNOSIS — R972 Elevated prostate specific antigen [PSA]: Secondary | ICD-10-CM

## 2022-04-28 ENCOUNTER — Telehealth: Payer: Self-pay | Admitting: Family Medicine

## 2022-04-28 NOTE — Telephone Encounter (Signed)
Patient returned call and requests to be called at ph# [phone]

## 2022-04-29 NOTE — Telephone Encounter (Signed)
See results note. 

## 2022-05-03 ENCOUNTER — Ambulatory Visit: Payer: Medicare Other | Admitting: Internal Medicine

## 2022-05-12 ENCOUNTER — Ambulatory Visit (INDEPENDENT_AMBULATORY_CARE_PROVIDER_SITE_OTHER): Payer: Medicare Other | Admitting: Internal Medicine

## 2022-05-12 ENCOUNTER — Encounter: Payer: Self-pay | Admitting: Internal Medicine

## 2022-05-12 VITALS — BP 130/74 | HR 76 | Ht 69.0 in | Wt 199.4 lb

## 2022-05-12 DIAGNOSIS — J300 Vasomotor rhinitis: Secondary | ICD-10-CM | POA: Diagnosis not present

## 2022-05-12 DIAGNOSIS — K219 Gastro-esophageal reflux disease without esophagitis: Secondary | ICD-10-CM

## 2022-05-12 DIAGNOSIS — J449 Chronic obstructive pulmonary disease, unspecified: Secondary | ICD-10-CM

## 2022-05-12 MED ORDER — IPRATROPIUM BROMIDE 0.06 % NA SOLN
2.0000 | Freq: Three times a day (TID) | NASAL | 5 refills | Status: DC
Start: 2022-05-12 — End: 2023-03-31

## 2022-05-12 NOTE — Progress Notes (Signed)
Sparsh Callens    [id number]    Apr 24, 1941  Primary Care Physician:Parker, Algis Greenhouse, MD Date of Appointment: 05/12/2022 Established Patient Visit  Chief complaint:   Chief Complaint  Patient presents with   Follow-up    Pt states that he has been having problems with feeling like he has something in his throat and is not sure if it is from postnasal drip or what.    HPI: Jequan Shahin is a 81 y.o. 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   Interval Updates: He is using his breztri. Having some tickling in his throat from post nasal drainage.  Still taking nasacort.   Region 2 allergy panel and cbc with diff showed ige and eos not elevated.    Having post nasal drainage but improved with nasacort.and prn nasal saline.  Reflux is controlled no nocturnal awakenings for cough or drainage.   Current Regimen: breztri 2 puffs twice a day, albuterol Asthma Triggers: seasonal allergies Exacerbations in the last year: usually once/year. Most recently nov 2022 History of hospitalization or intubation: never Hives: denies Allergy Testing: never had.  GERD: yes on prilosec. On PPI omeprazole 40 mg daily Allergic Rhinitis: yes, on nasacort ACT:  Asthma Control Test ACT Total Score  05/12/2022  2:33 PM 17  12/18/2021 11:33 AM 18  01/21/2020 12:15 PM 13   FeNO: 40 ppb  I have reviewed the patient's family social and past medical history and updated as appropriate.   Past Medical History:  Diagnosis Date   Asthma    Depression    Hypertension    Prostate cancer [facility])    Urinary incontinence     Past Surgical History:  Procedure Laterality Date   GALLBLADDER SURGERY  1997   REPLACEMENT TOTAL KNEE BILATERAL  2001   2007   surgery replacement Bilateral 2005    Family History  Problem Relation Age of Onset   Diabetes Brother    Heart disease Paternal Grandfather    Asthma Neg Hx     Social History   Occupational History    Occupation: retired  Tobacco Use   Smoking status: Former    Types: Cigars    Quit date: 10/22/2019    Years since quitting: 2.5   Smokeless tobacco: Never   Tobacco comments:    smoked occ. with son. did not tolerate it long. 12/14/19.  Vaping Use   Vaping Use: Never used  Substance and Sexual Activity   Alcohol use: Yes   Drug use: Never   Sexual activity: Not on file     Physical Exam: Blood pressure 130/74, pulse 76, height '5\' 9"'$  (1.753 m), weight 199 lb 6.4 oz (90.4 kg), SpO2 95 %.  Gen:      No acute distress, able to complete full sentences Lungs:   ctab no wheezes or crackle CV:         RRR systolic murmur ENT: no nasal polyps, mild cobblestoning in oropharynx,.    Data Reviewed: Imaging: I have personally reviewed the outside hospital records including pulmonary function tests and reports of CT chest from December 2019 which demonstrates air trapping, tracheobronchomalacia, mild bibasilar atelectasis  PFTs: Spirometry completed April 2021 shows moderately severe airflow limitation with an FEV1 53% of predicted.  I reviewed his PFTs from [facility] in [place] and these also demonstrated Airflow limitation with +BD response.   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 Will add  ipratropium nasal spray. Consider allergy referral if no improvement.  continue PPI   Return to Care: Return in about 7 months (around 12/11/2022).    Lenice Llamas, MD [facility] [place]

## 2022-05-12 NOTE — Patient Instructions (Addendum)
Please schedule follow up scheduled with myself in 7 months.  If my schedule is not open yet, we will contact you with a reminder closer to that time. Please call [phone] if you haven't heard from [place] a month before.   Continue breztri 2 puffs twice a day. Gargle after use.   Continue reflux medicine.   Continue albuterol as needed. Can keep taking the nasacort nasal spray since it helps.  I will add ipratropium nasal spray three times a day as needed to see if it helps drainage. If it doesn't help let me know and we can send you to an allergist or ENT doctor.

## 2022-05-19 ENCOUNTER — Other Ambulatory Visit: Payer: Self-pay | Admitting: Internal Medicine

## 2022-05-19 ENCOUNTER — Other Ambulatory Visit: Payer: Self-pay | Admitting: Family Medicine

## 2022-05-19 DIAGNOSIS — J301 Allergic rhinitis due to pollen: Secondary | ICD-10-CM

## 2022-05-19 NOTE — Telephone Encounter (Signed)
The original prescription was discontinued on 09/11/2021

## 2022-06-05 IMAGING — PT NM PET TUM IMG SKULL BASE T - THIGH
1 of 7 series · 1 of 25 positions shown · non-contrast
Comparison: None.

CLINICAL DATA: Brachytherapy ten years prior. Elevated PSA equal
4.2

EXAM:
NUCLEAR MEDICINE PET SKULL BASE TO THIGH
TECHNIQUE: 9.2 mCi F18 Piflufolastat (Pylarify) was injected intravenously.
Full-ring PET imaging was performed from the skull base to thigh
after the radiotracer. CT data was obtained and used for attenuation
correction and anatomic localization.

[Series 4: pet sk_thigh ac · axial · 5.0mm · 4.07mm/px · 1 of 257 slices shown]
[im 154/257]
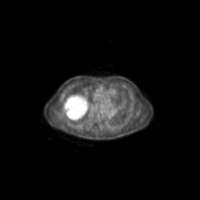

[1 of 25 positions shown; findings below may reference images not displayed]

FINDINGS: NECK

No radiotracer activity in neck lymph nodes.

Incidental CT finding: None

CHEST

No hypermetabolic mediastinal lymph nodes. No radiotracer activity
within mediastinal lymph nodes. Single nodule in the LEFT lower lobe
measures 5 mm (image 117/series 3). No radiotracer activity mild
basilar atelectasis.

Incidental CT finding: None

ABDOMEN/PELVIS

Prostate: Focal activity centrally within the prostate gland is
favored radiotracer within the urine collecting in the prostatic
urethra

Multi brachytherapy seeds within the prostatic bed.

Lymph nodes: No abnormal radiotracer accumulation within pelvic or
abdominal nodes.

Liver: No evidence of liver metastasis

Incidental CT finding: None

SKELETON

No focal  activity to suggest skeletal metastasis.
IMPRESSION: 1. Linear activity through the central prostate gland is favored
urine radiotracer. No clear evidence of recurrence in the prostate
gland.
2. No evidence of local pelvic nodal metastasis or periaortic
retroperitoneal nodal metastasis.
3. No evidence of visceral metastasis or skeletal metastasis.
4. Small LEFT lobe pulmonary nodule is favored benign.

## 2022-06-25 ENCOUNTER — Other Ambulatory Visit: Payer: Self-pay | Admitting: Family Medicine

## 2022-07-05 ENCOUNTER — Encounter: Payer: Self-pay | Admitting: *Deleted

## 2022-07-21 ENCOUNTER — Encounter: Payer: Self-pay | Admitting: Family Medicine

## 2022-07-21 ENCOUNTER — Ambulatory Visit (INDEPENDENT_AMBULATORY_CARE_PROVIDER_SITE_OTHER): Payer: Medicare Other | Admitting: Family Medicine

## 2022-07-21 VITALS — BP 157/79 | HR 72 | Temp 98.0°F | Ht 69.0 in | Wt 198.2 lb

## 2022-07-21 DIAGNOSIS — M5090 Cervical disc disorder, unspecified, unspecified cervical region: Secondary | ICD-10-CM | POA: Diagnosis not present

## 2022-07-21 DIAGNOSIS — I10 Essential (primary) hypertension: Secondary | ICD-10-CM

## 2022-07-21 DIAGNOSIS — G4700 Insomnia, unspecified: Secondary | ICD-10-CM

## 2022-07-21 MED ORDER — MELOXICAM 15 MG PO TABS: 15.0000 mg | ORAL_TABLET | Freq: Every day | ORAL | 0 refills | Status: DC

## 2022-07-21 MED ORDER — BACLOFEN 10 MG PO TABS: 5.0000 mg | ORAL_TABLET | Freq: Four times a day (QID) | ORAL | 0 refills | Status: DC | PRN

## 2022-07-21 NOTE — Assessment & Plan Note (Signed)
Not having much effect with Lunesta.  We have tried Ambien in the past and he did not do well with this either.  We will be adding on baclofen as above which may help some with the sleep.  Would consider addition of gabapentin at some point in the future if this continues to be an issue.

## 2022-07-21 NOTE — Progress Notes (Signed)
   Austin [name] is a 81 y.o. male who presents today for an office visit.  Assessment/Plan:  New/Acute Problems: Right Upper Back Pain Consistent with trapezius strain.  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  We discussed home exercise program and handout was given.  We will also start course of baclofen and meloxicam.  He can also use a heating pad as needed.  We discussed reasons return to care.  If not improving would consider referral to PT or sports medicine.   Chronic Problems Addressed Today: Insomnia Not having much effect with Lunesta.  We have tried Ambien in the past and he did not do well with this either.  We will be adding on baclofen as above which may help some with the sleep.  Would consider addition of gabapentin at some point in the future if this continues to be an issue.  Essential hypertension Has whitecoat hypertension.  Home readings typically in the 120s to 30s over 80s.  Continue Norvasc 5 mg daily and losartan 100 mg daily.     Subjective:  HPI:  Patient here with left shoulder and neck pain. [place]. Initially did well but after a few weeks started having more pain into shoulder and neck. Tried taking ibuprofen with some improvement. No weakness or numbness.  Pain mostly located in right shoulder.  Worse with certain motions.  See A/p for status of chronic conditions       Objective:  Physical Exam: BP (!) 157/79   Pulse 72   Temp 98 F (36.7 C) (Temporal)   Ht '5\' 9"'$  (1.753 m)   Wt 198 lb 3.2 oz (89.9 kg)   SpO2 98%   BMI 29.27 kg/m   Gen: No acute distress, resting comfortably CV: Regular rate and rhythm with no murmurs appreciated Pulm: Normal work of breathing, clear to auscultation bilaterally with no crackles, wheezes, or rhonchi MSK: - Back: No deformities.  Tenderness palpation along the right superior trapezius and right cervical  paraspinal muscles - Arms: no deformities.  Normal strength with resisted supra spinatus testing and internal and external rotation. Neuro: Grossly normal, moves all extremities Psych: Normal affect and thought content      Shi Grose M. Jerline Pain, MD 07/21/2022 9:20 AM

## 2022-07-21 NOTE — Patient Instructions (Signed)
It was very nice to see you today!  You have a strain in one of the muscles in your neck.  Please work on the exercises.  Please take the muscle relaxer and meloxicam.  Let me know if not improving in the next few weeks.  Take care, Dr Jerline Pain  PLEASE NOTE:  If you had any lab tests please let us know if you have not heard back within a few days. You may see your results on mychart before we have a chance to review them but we will give you a call once they are reviewed by [place]. If we ordered any referrals today, please let us know if you have not heard from their office within the next week.   Please try these tips to maintain a healthy lifestyle:  Eat at least 3 REAL meals and 1-2 snacks per day.  Aim for no more than 5 hours between eating.  If you eat breakfast, please do so within one hour of getting up.   Each meal should contain half fruits/vegetables, one quarter protein, and one quarter carbs (no bigger than a computer mouse)  Cut down on sweet beverages. This includes juice, soda, and sweet tea.   Drink at least 1 glass of water with each meal and aim for at least 8 glasses per day  Exercise at least 150 minutes every week.

## 2022-07-21 NOTE — Assessment & Plan Note (Signed)
Has whitecoat hypertension.  Home readings typically in the 120s to 30s over 80s.  Continue Norvasc 5 mg daily and losartan 100 mg daily.

## 2022-07-23 ENCOUNTER — Encounter: Payer: Self-pay | Admitting: Family Medicine

## 2022-07-23 ENCOUNTER — Ambulatory Visit (INDEPENDENT_AMBULATORY_CARE_PROVIDER_SITE_OTHER): Payer: Medicare Other | Admitting: Family Medicine

## 2022-07-23 VITALS — BP 167/83 | HR 85 | Temp 98.0°F | Ht 69.0 in | Wt 198.4 lb

## 2022-07-23 DIAGNOSIS — I10 Essential (primary) hypertension: Secondary | ICD-10-CM

## 2022-07-23 DIAGNOSIS — R300 Dysuria: Secondary | ICD-10-CM

## 2022-07-23 DIAGNOSIS — C61 Malignant neoplasm of prostate: Secondary | ICD-10-CM | POA: Diagnosis not present

## 2022-07-23 LAB — POCT URINALYSIS DIPSTICK
Bilirubin, UA: NEGATIVE
Blood, UA: NEGATIVE
Glucose, UA: NEGATIVE
Ketones, UA: NEGATIVE
Nitrite, UA: NEGATIVE
Protein, UA: NEGATIVE
Spec Grav, UA: 1.01 (ref 1.010–1.025)
Urobilinogen, UA: 0.2 E.U./dL
pH, UA: 7 (ref 5.0–8.0)

## 2022-07-23 MED ORDER — CIPROFLOXACIN HCL 500 MG PO TABS: 500.0000 mg | ORAL_TABLET | Freq: Two times a day (BID) | ORAL | 0 refills | Status: AC

## 2022-07-23 NOTE — Assessment & Plan Note (Signed)
Continue manage per urology.  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

## 2022-07-23 NOTE — Assessment & Plan Note (Signed)
Normally well controlled at home.  Elevated today in setting of the skin whitecoat hypertension.  Continue rest 5 mg daily and losartan 100 mg daily.

## 2022-07-23 NOTE — Progress Notes (Signed)
oc

## 2022-07-23 NOTE — Progress Notes (Signed)
   Austin [name] is a 81 y.o. male who presents today for an office visit.  Assessment/Plan:  New/Acute Problems: UTI No red flags.  No signs of systemic illness.  Consistent with UTI.  UA positive leukocyte esterase.  We empirically start antibiotics while we await culture results.  Culture from 2 years ago showed Proteus mirabilis sensitive to cephalosporins, Bactrim, and fluoroquinolones.  We will start Cipro 500 mg twice daily x3 days given that he has been on prophylactic Macrobid.  Encouraged hydration.  Chronic Problems Addressed Today: Essential hypertension Normally well controlled at home.  Elevated today in setting of the skin whitecoat hypertension.  Continue rest 5 mg daily and losartan 100 mg daily.     Subjective:  HPI:  Patient here with concern for UTI.  Symptoms started over a week ago. He is having dysuria and burning with urination. No fevers or chills. Some frequency. Feels like previous UTIs.  Called his urology office who were not able to see him today and refer him to follow-up with [place].  He has been taking Macrobid 100 mg daily as prophylaxis for recurrent UTIs.       Objective:  Physical Exam: BP (!) 167/83 (BP Location: Left Arm, Patient Position: Sitting)   Pulse 85   Temp 98 F (36.7 C) (Temporal)   Ht '5\' 9"'$  (1.753 m)   Wt 198 lb 6.4 oz (90 kg)   SpO2 93%   BMI 29.30 kg/m   Gen: No acute distress, resting comfortably CV: Regular rate and rhythm with no murmurs appreciated Pulm: Normal work of breathing, clear to auscultation bilaterally with no crackles, wheezes, or rhonchi Neuro: Grossly normal, moves all extremities Psych: Normal affect and thought content      Surafel Hilleary M. Jerline Pain, MD 07/23/2022 2:48 PM

## 2022-07-24 ENCOUNTER — Other Ambulatory Visit: Payer: Self-pay | Admitting: Family Medicine

## 2022-07-24 LAB — URINE CULTURE
MICRO NUMBER:: 14048471
Result:: NO GROWTH
SPECIMEN QUALITY:: ADEQUATE

## 2022-07-26 ENCOUNTER — Telehealth: Payer: Self-pay | Admitting: Family Medicine

## 2022-07-26 ENCOUNTER — Other Ambulatory Visit: Payer: Self-pay | Admitting: *Deleted

## 2022-07-26 MED ORDER — ESZOPICLONE 3 MG PO TABS: ORAL_TABLET | ORAL | 2 refills | Status: DC

## 2022-07-26 NOTE — Progress Notes (Signed)
Please inform patient of the following:  Urine culture is negative but this may be due to the Macrobid that he had taken prior to [place] taking the urine sample.  Recommend he finish the course of antibiotics that we sent for him and let us know if symptoms are not improving.

## 2022-07-26 NOTE — Telephone Encounter (Signed)
FYI  Patient was calling to check on results of urine test from 10/13. Informed him PCP nurse will be able to call once results are ready to be explained. Pt verbalized understanding.

## 2022-07-28 ENCOUNTER — Telehealth: Payer: Self-pay | Admitting: Family Medicine

## 2022-07-28 NOTE — Telephone Encounter (Signed)
Patient states the RX for sleeping medication that was sent to Pharmacy is not covered by insurance.  Patient requests a new Rx for an alternative sleeping medication be sent to  [facility], [place] Phone: [phone]  Fax: [phone]      Also, Patient states he did a urinalysis which came out clear.

## 2022-07-28 NOTE — Telephone Encounter (Signed)
Lunesta not covered by insurance. Can another medication be sent in? Please advise

## 2022-07-29 NOTE — Telephone Encounter (Signed)
Ok to send in gabapentin '100mg'$  nightly. Would like for him to follow up with [place] again in a couple of weeks.  Algis Greenhouse. Jerline Pain, MD 07/29/2022 1:01 PM

## 2022-07-30 MED ORDER — GABAPENTIN 100 MG PO CAPS: 100.0000 mg | ORAL_CAPSULE | Freq: Every day | ORAL | 0 refills | Status: DC

## 2022-07-30 NOTE — Addendum Note (Signed)
Addended by: Marian Sorrow on: 07/30/2022 09:46 AM   Modules accepted: Orders

## 2022-07-30 NOTE — Telephone Encounter (Signed)
Dr. Jerline Pain, you need to send Gabapentin in, cart loaded with correct pharmacy.

## 2022-08-11 ENCOUNTER — Other Ambulatory Visit: Payer: Self-pay | Admitting: Family Medicine

## 2022-08-13 ENCOUNTER — Other Ambulatory Visit: Payer: Self-pay | Admitting: Internal Medicine

## 2022-08-13 DIAGNOSIS — K219 Gastro-esophageal reflux disease without esophagitis: Secondary | ICD-10-CM

## 2022-08-17 ENCOUNTER — Other Ambulatory Visit: Payer: Self-pay | Admitting: *Deleted

## 2022-08-17 ENCOUNTER — Telehealth: Payer: Self-pay | Admitting: Family Medicine

## 2022-08-17 MED ORDER — ALBUTEROL SULFATE (2.5 MG/3ML) 0.083% IN NEBU: INHALATION_SOLUTION | RESPIRATORY_TRACT | 0 refills | Status: DC

## 2022-08-25 ENCOUNTER — Telehealth: Payer: Self-pay | Admitting: Internal Medicine

## 2022-08-25 MED ORDER — BREZTRI AEROSPHERE 160-9-4.8 MCG/ACT IN AERO: 2.0000 | INHALATION_SPRAY | Freq: Two times a day (BID) | RESPIRATORY_TRACT | 5 refills | Status: DC

## 2022-08-25 MED ORDER — ALBUTEROL SULFATE HFA 108 (90 BASE) MCG/ACT IN AERS: INHALATION_SPRAY | RESPIRATORY_TRACT | 5 refills | Status: DC

## 2022-08-25 NOTE — Telephone Encounter (Signed)
Pharmacy called to request a refill for patient's Breztri.  If there are any questions.  Please contact pharmacy.

## 2022-08-25 NOTE — Telephone Encounter (Signed)
Caller States: -Received prescription, allows for patient to pick up one box. -Typically the patient picks up 5 boxes.   Caller Requests: -Increase of quantity for prescription.  albuterol (PROVENTIL) (2.5 MG/3ML) 0.083% nebulizer solution [[id number]]

## 2022-08-25 NOTE — Telephone Encounter (Signed)
Refill of pt's Breztri and albuterol inhalers have been sent to preferred pharmacy for pt. Nothing further needed.

## 2022-08-25 NOTE — Addendum Note (Signed)
Addended by: Lorretta Harp on: 08/25/2022 12:49 PM   Modules accepted: Orders

## 2022-08-26 ENCOUNTER — Other Ambulatory Visit: Payer: Self-pay | Admitting: *Deleted

## 2022-08-26 ENCOUNTER — Other Ambulatory Visit: Payer: Self-pay | Admitting: Family Medicine

## 2022-08-26 ENCOUNTER — Other Ambulatory Visit: Payer: Self-pay | Admitting: Internal Medicine

## 2022-08-26 MED ORDER — ALBUTEROL SULFATE (2.5 MG/3ML) 0.083% IN NEBU: INHALATION_SOLUTION | RESPIRATORY_TRACT | 0 refills | Status: DC

## 2022-08-26 NOTE — Telephone Encounter (Signed)
Rx was done by Spero Geralds, MD  LVM to patient, advising patient  to call Dr Shearon Stalls office for medication changes

## 2022-09-10 ENCOUNTER — Other Ambulatory Visit: Payer: Self-pay | Admitting: Family Medicine

## 2022-09-21 ENCOUNTER — Ambulatory Visit (INDEPENDENT_AMBULATORY_CARE_PROVIDER_SITE_OTHER): Payer: Medicare Other

## 2022-09-21 VITALS — Wt 198.0 lb

## 2022-09-21 DIAGNOSIS — Z0000 Encounter for general adult medical examination without abnormal findings: Secondary | ICD-10-CM | POA: Diagnosis not present

## 2022-09-21 NOTE — Patient Instructions (Signed)
Austin [name] , Thank you for taking time to come for your Medicare Wellness Visit. I appreciate your ongoing commitment to your health goals. Please review the following plan we discussed and let me know if I can assist you in the future.   These are the goals we discussed:  Goals      Patient Stated     None at this time     Patient Stated     None at this time     Patient Stated     Maintain healthy        This is a list of the screening recommended for you and due dates:  Health Maintenance  Topic Date Due   DTaP/Tdap/Td vaccine (1 - Tdap) Never done   Medicare Annual Wellness Visit  09/22/2023   Pneumonia Vaccine  Completed   Flu Shot  Completed   HPV Vaccine  Aged Out   COVID-19 Vaccine  Discontinued   Zoster (Shingles) Vaccine  Discontinued    Advanced directives: Please bring a copy of your health care power of attorney and living will to the office at your convenience.  Conditions/risks identified: maintain health   Next appointment: Follow up in one year for your annual wellness visit.   Preventive Care 25 Years and Older, Male  Preventive care refers to lifestyle choices and visits with your health care provider that can promote health and wellness. What does preventive care include? A yearly physical exam. This is also called an annual well check. Dental exams once or twice a year. Routine eye exams. Ask your health care provider how often you should have your eyes checked. Personal lifestyle choices, including: Daily care of your teeth and gums. Regular physical activity. Eating a healthy diet. Avoiding tobacco and drug use. Limiting alcohol use. Practicing safe sex. Taking low doses of aspirin every day. Taking vitamin and mineral supplements as recommended by your health care provider. What happens during an annual well check? The services and screenings done by your health care provider during your annual well check will depend on your age, overall  health, lifestyle risk factors, and family history of disease. Counseling  Your health care provider may ask you questions about your: Alcohol use. Tobacco use. Drug use. Emotional well-being. Home and relationship well-being. Sexual activity. Eating habits. History of falls. Memory and ability to understand (cognition). Work and work Statistician. Screening  You may have the following tests or measurements: Height, weight, and BMI. Blood pressure. Lipid and cholesterol levels. These may be checked every 5 years, or more frequently if you are over 73 years old. Skin check. Lung cancer screening. You may have this screening every year starting at age 47 if you have a 30-pack-year history of smoking and currently smoke or have quit within the past 15 years. Fecal occult blood test (FOBT) of the stool. You may have this test every year starting at age 31. Flexible sigmoidoscopy or colonoscopy. You may have a sigmoidoscopy every 5 years or a colonoscopy every 10 years starting at age 49. Prostate cancer screening. Recommendations will vary depending on your family history and other risks. Hepatitis C blood test. Hepatitis B blood test. Sexually transmitted disease (STD) testing. Diabetes screening. This is done by checking your blood sugar (glucose) after you have not eaten for a while (fasting). You may have this done every 1-3 years. Abdominal aortic aneurysm (AAA) screening. You may need this if you are a current or former smoker. Osteoporosis. You may be screened  starting at age 36 if you are at high risk. Talk with your health care provider about your test results, treatment options, and if necessary, the need for more tests. Vaccines  Your health care provider may recommend certain vaccines, such as: Influenza vaccine. This is recommended every year. Tetanus, diphtheria, and acellular pertussis (Tdap, Td) vaccine. You may need a Td booster every 10 years. Zoster vaccine. You may  need this after age 75. Pneumococcal 13-valent conjugate (PCV13) vaccine. One dose is recommended after age 60. Pneumococcal polysaccharide (PPSV23) vaccine. One dose is recommended after age 29. Talk to your health care provider about which screenings and vaccines you need and how often you need them. This information is not intended to replace advice given to you by your health care provider. Make sure you discuss any questions you have with your health care provider. Document Released: 10/24/2015 Document Revised: 06/16/2016 Document Reviewed: 07/29/2015 Elsevier Interactive Patient Education  2017 [place] Prevention in the Home Falls can cause injuries. They can happen to people of all ages. There are many things you can do to make your home safe and to help prevent falls. What can I do on the outside of my home? Regularly fix the edges of walkways and driveways and fix any cracks. Remove anything that might make you trip as you walk through a door, such as a raised step or threshold. Trim any bushes or trees on the path to your home. Use bright outdoor lighting. Clear any walking paths of anything that might make someone trip, such as rocks or tools. Regularly check to see if handrails are loose or broken. Make sure that both sides of any steps have handrails. Any raised decks and porches should have guardrails on the edges. Have any leaves, snow, or ice cleared regularly. Use sand or salt on walking paths during winter. Clean up any spills in your garage right away. This includes oil or grease spills. What can I do in the bathroom? Use night lights. Install grab bars by the toilet and in the tub and shower. Do not use towel bars as grab bars. Use non-skid mats or decals in the tub or shower. If you need to sit down in the shower, use a plastic, non-slip stool. Keep the floor dry. Clean up any water that spills on the floor as soon as it happens. Remove soap buildup in  the tub or shower regularly. Attach bath mats securely with double-sided non-slip rug tape. Do not have throw rugs and other things on the floor that can make you trip. What can I do in the bedroom? Use night lights. Make sure that you have a light by your bed that is easy to reach. Do not use any sheets or blankets that are too big for your bed. They should not hang down onto the floor. Have a firm chair that has side arms. You can use this for support while you get dressed. Do not have throw rugs and other things on the floor that can make you trip. What can I do in the kitchen? Clean up any spills right away. Avoid walking on wet floors. Keep items that you use a lot in easy-to-reach places. If you need to reach something above you, use a strong step stool that has a grab bar. Keep electrical cords out of the way. Do not use floor polish or wax that makes floors slippery. If you must use wax, use non-skid floor wax. Do not have throw  rugs and other things on the floor that can make you trip. What can I do with my stairs? Do not leave any items on the stairs. Make sure that there are handrails on both sides of the stairs and use them. Fix handrails that are broken or loose. Make sure that handrails are as long as the stairways. Check any carpeting to make sure that it is firmly attached to the stairs. Fix any carpet that is loose or worn. Avoid having throw rugs at the top or bottom of the stairs. If you do have throw rugs, attach them to the floor with carpet tape. Make sure that you have a light switch at the top of the stairs and the bottom of the stairs. If you do not have them, ask someone to add them for you. What else can I do to help prevent falls? Wear shoes that: Do not have high heels. Have rubber bottoms. Are comfortable and fit you well. Are closed at the toe. Do not wear sandals. If you use a stepladder: Make sure that it is fully opened. Do not climb a closed  stepladder. Make sure that both sides of the stepladder are locked into place. Ask someone to hold it for you, if possible. Clearly mark and make sure that you can see: Any grab bars or handrails. First and last steps. Where the edge of each step is. Use tools that help you move around (mobility aids) if they are needed. These include: Canes. Walkers. Scooters. Crutches. Turn on the lights when you go into a dark area. Replace any light bulbs as soon as they burn out. Set up your furniture so you have a clear path. Avoid moving your furniture around. If any of your floors are uneven, fix them. If there are any pets around you, be aware of where they are. Review your medicines with your doctor. Some medicines can make you feel dizzy. This can increase your chance of falling. Ask your doctor what other things that you can do to help prevent falls. This information is not intended to replace advice given to you by your health care provider. Make sure you discuss any questions you have with your health care provider. Document Released: 07/24/2009 Document Revised: 03/04/2016 Document Reviewed: 11/01/2014 Elsevier Interactive Patient Education  2017 [employer].

## 2022-09-21 NOTE — Progress Notes (Signed)
I connected with  [name] on 09/21/22 by a audio enabled telemedicine application and verified that I am speaking with the correct person using two identifiers.  Patient Location: Home  Provider Location: Office/Clinic  I discussed the limitations of evaluation and management by telemedicine. The patient expressed understanding and agreed to proceed.   Subjective:   Austin [name] is a 81 y.o. male who presents for Medicare Annual/Subsequent preventive examination.  Review of Systems     Cardiac Risk Factors include: advanced age (>76mn, >>19women);hypertension;dyslipidemia;male gender     Objective:    Today's Vitals   09/21/22 1249  Weight: 198 lb (89.8 kg)   Body mass index is 29.24 kg/m.     09/21/2022   12:55 PM 09/11/2021    1:40 PM 08/04/2020   10:27 AM 02/19/2020    1:24 PM  Advanced Directives  Does Patient Have a Medical Advance Directive? Yes Yes Yes No  Type of AParamed[place]of [place] will Living will [place] will   Copy of [place] Chart? No - copy requested No - copy requested No - copy requested   Would patient like information on creating a medical advance directive?    No - Patient declined    Current Medications (verified) Outpatient Encounter Medications as of 09/21/2022  Medication Sig   albuterol (PROVENTIL) (2.5 MG/3ML) 0.083% nebulizer solution TAKE 3 ML BY NEBULIZATION EVERY 4 HOURS AS NEEDED FOR WHEEZING OR SHORTNESS OF BREATH/DYSPNEA.   albuterol (VENTOLIN HFA) 108 (90 Base) MCG/ACT inhaler INHALE 1-2 PUFFS BY MOUTH EVERY 6 HOURS AS NEEDED FOR WHEEZE OR SHORTNESS OF BREATH   amLODipine (NORVASC) 5 MG tablet Take 1 tablet (5 mg total) by mouth daily.   atorvastatin (LIPITOR) 80 MG tablet TAKE 1 TABLET BY MOUTH EVERY DAY   baclofen (LIORESAL) 10 MG tablet Take 0.5-1 tablets (5-10 mg total) by mouth 4 (four) times daily as needed for muscle spasms.    Budeson-Glycopyrrol-Formoterol (BREZTRI AEROSPHERE) 160-9-4.8 MCG/ACT AERO Inhale 2 puffs into the lungs in the morning and at bedtime.   Cyanocobalamin (VITAMIN B 12 PO) Take 1,000 Units by mouth.   Eszopiclone 3 MG TABS TAKE 1 TABLET BY MOUTH AT BEDTIME TAKE IMMEDIATELY BEFORE BEDTIME   fenofibrate micronized (LOFIBRA) 200 MG capsule TAKE 1 CAPSULE BY MOUTH EVERY DAY   glucose blood (FREESTYLE LITE) test strip CHECK BLOOD SUGAR THREE TIMES A DAY E11.9   ibuprofen (ADVIL) 800 MG tablet TAKE 1 TABLET BY MOUTH EVERY 8 HOURS AS NEEDED   ipratropium (ATROVENT) 0.06 % nasal spray Place 2 sprays into both nostrils 3 (three) times daily.   Lancets Misc. (UNISTIK 2 EXTRA) MISC Use to check blood sugar 3 times daily/ Dx E11.9   loratadine (CLARITIN) 10 MG tablet Take 1 tablet (10 mg total) by mouth daily.   losartan (COZAAR) 100 MG tablet Take 1 tablet (100 mg total) by mouth daily.   magnesium oxide (MAG-OX) 400 (240 Mg) MG tablet TAKE 1 TABLET BY MOUTH EVERY DAY   meloxicam (MOBIC) 15 MG tablet TAKE 1 TABLET (15 MG TOTAL) BY MOUTH DAILY.   montelukast (SINGULAIR) 10 MG tablet TAKE 1 TABLET BY MOUTH EVERYDAY AT BEDTIME   nitrofurantoin, macrocrystal-monohydrate, (MACROBID) 100 MG capsule Take 100 mg by mouth daily.   omeprazole (PRILOSEC) 20 MG capsule TAKE 2 CAPSULES BY MOUTH EVERY DAY   solifenacin (VESICARE) 5 MG tablet Take 5 mg by mouth daily.   Vitamin D, Cholecalciferol, 50 MCG (  2000 UT) CAPS Take by mouth.   escitalopram (LEXAPRO) 20 MG tablet TAKE 1 TABLET BY MOUTH EVERY DAY (Patient not taking: Reported on 09/21/2022)   gabapentin (NEURONTIN) 100 MG capsule Take 1 capsule (100 mg total) by mouth at bedtime. (Patient not taking: Reported on 09/21/2022)   No facility-administered encounter medications on file as of 09/21/2022.    Allergies (verified) Patient has no known allergies.   History: Past Medical History:  Diagnosis Date   Asthma    Depression    Hypertension    Prostate  cancer ([place])    Urinary incontinence    Past Surgical History:  Procedure Laterality Date   GALLBLADDER SURGERY  1997   REPLACEMENT TOTAL KNEE BILATERAL  2001   2007   surgery replacement Bilateral 2005   Family History  Problem Relation Age of Onset   Diabetes Brother    Heart disease Paternal Grandfather    Asthma Neg Hx    Social History   Socioeconomic History   Marital status: Married    Spouse name: Not on file   Number of children: Not on file   Years of education: Not on file   Highest education level: Not on file  Occupational History   Occupation: retired  Tobacco Use   Smoking status: Former    Types: Cigars    Quit date: 10/22/2019    Years since quitting: 2.9   Smokeless tobacco: Never   Tobacco comments:    smoked occ. with son. did not tolerate it long. 12/14/19.  Vaping Use   Vaping Use: Never used  Substance and Sexual Activity   Alcohol use: Yes   Drug use: Never   Sexual activity: Not on file  Other Topics Concern   Not on file  Social History Narrative   Not on file   Social Determinants of Health   Financial Resource Strain: Low Risk  (09/21/2022)   Overall Financial Resource Strain (CARDIA)    Difficulty of Paying Living Expenses: Not hard at all  Food Insecurity: No Food Insecurity (09/21/2022)   Hunger Vital Sign    Worried About Running Out of Food in the Last Year: Never true    Ran Out of Food in the Last Year: Never true  Transportation Needs: No Transportation Needs (09/21/2022)   PRAPARE - Hydrologist (Medical): No    Lack of Transportation (Non-Medical): No  Physical Activity: Insufficiently Active (09/21/2022)   Exercise Vital Sign    Days of Exercise per Week: 3 days    Minutes of Exercise per Session: 30 min  Stress: No Stress Concern Present (09/21/2022)   [place]    Feeling of Stress : Not at all  Social Connections:  Moderately Isolated (09/21/2022)   Social Connection and Isolation Panel [NHANES]    Frequency of Communication with Friends and Family: More than three times a week    Frequency of Social Gatherings with Friends and Family: More than three times a week    Attends Religious Services: Never    Marine scientist or Organizations: No    Attends Archivist Meetings: Never    Marital Status: Married    Tobacco Counseling Counseling given: Not Answered Tobacco comments: smoked occ. with son. did not tolerate it long. 12/14/19.   Clinical Intake:  Pre-visit preparation completed: Yes  Pain : No/denies pain     BMI - recorded: 29.24 Nutritional Status: BMI 25 -  29 Overweight Nutritional Risks: None Diabetes: No  How often do you need to have someone help you when you read instructions, pamphlets, or other written materials from your doctor or pharmacy?: 1 - Never  Diabetic?no  Interpreter Needed?: No  Information entered by :: Charlott Rakes, LPN   Activities of Daily Living    09/21/2022   12:57 PM  In your present state of health, do you have any difficulty performing the following activities:  Hearing? 1  Comment wears hearing aids  Vision? 0  Difficulty concentrating or making decisions? 0  Walking or climbing stairs? 0  Dressing or bathing? 0  Doing errands, shopping? 0  Preparing Food and eating ? N  Using the Toilet? N  In the past six months, have you accidently leaked urine? Y  Comment wears a brief  Do you have problems with loss of bowel control? N  Managing your Medications? N  Managing your Finances? N  Housekeeping or managing your Housekeeping? N    Patient Care Team: Vivi Barrack, MD as PCP - General (Family Medicine)  Indicate any recent Medical Services you may have received from other than [facility] providers in the past year (date may be approximate).     Assessment:   This is a routine wellness examination for  [facility].  Hearing/Vision screen Hearing Screening - Comments:: Pt wears hearing aids  Vision Screening - Comments:: Pt follows up with Dr Katy Fitch for annaul eye exams   Dietary issues and exercise activities discussed: Current Exercise Habits: Home exercise routine, Type of exercise: Other - see comments, Time (Minutes): 30, Frequency (Times/Week): 3, Weekly Exercise (Minutes/Week): 90   Goals Addressed             This Visit's Progress    Patient Stated       Maintain healthy       Depression Screen    09/21/2022   12:54 PM 07/23/2022    1:50 PM 07/21/2022    8:26 AM 04/23/2022    9:04 AM 04/23/2022    9:00 AM 03/16/2022   12:59 PM 09/11/2021    1:38 PM  PHQ 2/9 Scores  PHQ - 2 Score 0 0 0 0 0 0 1  PHQ- 9 Score 0 0  0       Fall Risk    09/21/2022   12:57 PM 07/21/2022    8:26 AM 04/23/2022    9:00 AM 03/16/2022   12:59 PM 09/11/2021    1:41 PM  [place] in the past year? 0 1 0 0 0  Number falls in past yr: 0 0 0 0 0  Injury with Fall? 0 1 0 0 0  Risk for fall due to : Impaired vision No Fall Risks No Fall Risks No Fall Risks Impaired vision  Follow up Falls prevention discussed    Falls prevention discussed    FALL RISK PREVENTION PERTAINING TO THE HOME:  Any stairs in or around the home? Yes  If so, are there any without handrails? No  Home free of loose throw rugs in walkways, pet beds, electrical cords, etc? Yes  Adequate lighting in your home to reduce risk of falls? Yes   ASSISTIVE DEVICES UTILIZED TO PREVENT FALLS:  Life alert? No  Use of a cane, walker or w/c? No  Grab bars in the bathroom? Yes  Shower chair or bench in shower? Yes  Elevated toilet seat or a handicapped toilet? No   TIMED UP AND  GO:  Was the test performed? No .   Cognitive Function:        09/21/2022   12:58 PM 09/11/2021    1:43 PM 08/04/2020   10:32 AM  6CIT Screen  What Year? 0 points 0 points 0 points  What month? 0 points 0 points 0 points  What time? 0  points 0 points   Count back from 20 0 points 0 points 0 points  Months in reverse 4 points 2 points 4 points  Repeat phrase 0 points 2 points 2 points  Total Score 4 points 4 points     Immunizations Immunization History  Administered Date(s) Administered   Fluad Quad(high Dose 65+) 07/24/2019   Influenza, High Dose Seasonal PF 05/31/2021, 07/13/2022   Influenza-Unspecified 06/04/2020   PFIZER(Purple Top)SARS-COV-2 Vaccination 11/23/2019, 12/17/2019, 07/26/2020   PNEUMOCOCCAL CONJUGATE-20 02/10/2021   Zoster Recombinat (Shingrix) 01/14/2022, 04/05/2022    TDAP status: Due, Education has been provided regarding the importance of this vaccine. Advised may receive this vaccine at local pharmacy or Health Dept. Aware to provide a copy of the vaccination record if obtained from local pharmacy or Health Dept. Verbalized acceptance and understanding.  Flu Vaccine status: Up to date  Pneumococcal vaccine status: Up to date  Covid-19 vaccine status: Completed vaccines  Qualifies for Shingles Vaccine? Yes   Zostavax completed Yes   Shingrix Completed?: Yes  Screening Tests Health Maintenance  Topic Date Due   DTaP/Tdap/Td (1 - Tdap) Never done   Medicare Annual Wellness (AWV)  09/22/2023   Pneumonia Vaccine 22+ Years old  Completed   INFLUENZA VACCINE  Completed   HPV VACCINES  Aged Out   COVID-19 Vaccine  Discontinued   Zoster Vaccines- Shingrix  Discontinued    Health Maintenance  Health Maintenance Due  Topic Date Due   DTaP/Tdap/Td (1 - Tdap) Never done    Colorectal cancer screening: No longer required.    Additional Screening:  Vision Screening: Recommended annual ophthalmology exams for early detection of glaucoma and other disorders of the eye. Is the patient up to date with their annual eye exam?  Yes  Who is the provider or what is the name of the office in which the patient attends annual eye exams? Dr Katy Fitch  If pt is not established with a provider, would  they like to be referred to a provider to establish care? No .   Dental Screening: Recommended annual dental exams for proper oral hygiene  Community Resource Referral / Chronic Care Management: CRR required this visit?  No   CCM required this visit?  No      Plan:     I have personally reviewed and noted the following in the patient's chart:   Medical and social history Use of alcohol, tobacco or illicit drugs  Current medications and supplements including opioid prescriptions. Patient is not currently taking opioid prescriptions. Functional ability and status Nutritional status Physical activity Advanced directives List of other physicians Hospitalizations, surgeries, and ER visits in previous 12 months Vitals Screenings to include cognitive, depression, and falls Referrals and appointments  In addition, I have reviewed and discussed with patient certain preventive protocols, quality metrics, and best practice recommendations. A written personalized care plan for preventive services as well as general preventive health recommendations were provided to patient.     Willette Brace, LPN   [id number]   Nurse Notes: none

## 2022-09-26 ENCOUNTER — Other Ambulatory Visit: Payer: Self-pay | Admitting: Family Medicine

## 2022-10-18 LAB — PSA: PSA: 5.92

## 2022-10-22 ENCOUNTER — Other Ambulatory Visit: Payer: Self-pay | Admitting: Family Medicine

## 2022-10-26 ENCOUNTER — Other Ambulatory Visit: Payer: Self-pay | Admitting: Family Medicine

## 2022-10-27 ENCOUNTER — Encounter: Payer: Self-pay | Admitting: Family Medicine

## 2022-10-27 ENCOUNTER — Ambulatory Visit: Payer: Medicare Other | Admitting: Family Medicine

## 2022-10-27 ENCOUNTER — Ambulatory Visit (INDEPENDENT_AMBULATORY_CARE_PROVIDER_SITE_OTHER): Payer: Medicare Other | Admitting: Family Medicine

## 2022-10-27 VITALS — BP 138/78 | HR 78 | Temp 97.5°F | Ht 69.0 in | Wt 200.0 lb

## 2022-10-27 DIAGNOSIS — E559 Vitamin D deficiency, unspecified: Secondary | ICD-10-CM | POA: Diagnosis not present

## 2022-10-27 DIAGNOSIS — C61 Malignant neoplasm of prostate: Secondary | ICD-10-CM

## 2022-10-27 DIAGNOSIS — F321 Major depressive disorder, single episode, moderate: Secondary | ICD-10-CM

## 2022-10-27 DIAGNOSIS — I10 Essential (primary) hypertension: Secondary | ICD-10-CM

## 2022-10-27 DIAGNOSIS — R739 Hyperglycemia, unspecified: Secondary | ICD-10-CM

## 2022-10-27 DIAGNOSIS — E785 Hyperlipidemia, unspecified: Secondary | ICD-10-CM

## 2022-10-27 DIAGNOSIS — G4700 Insomnia, unspecified: Secondary | ICD-10-CM

## 2022-10-27 LAB — COMPREHENSIVE METABOLIC PANEL
ALT: 29 U/L (ref 0–53)
AST: 23 U/L (ref 0–37)
Albumin: 4.1 g/dL (ref 3.5–5.2)
Alkaline Phosphatase: 78 U/L (ref 39–117)
BUN: 16 mg/dL (ref 6–23)
CO2: 30 mEq/L (ref 19–32)
Calcium: 9.4 mg/dL (ref 8.4–10.5)
Chloride: 103 mEq/L (ref 96–112)
Creatinine, Ser: 0.66 mg/dL (ref 0.40–1.50)
GFR: 87.72 mL/min (ref >60.00)
Glucose, Bld: 122 mg/dL — ABNORMAL HIGH (ref 70–99)
Potassium: 4.5 mEq/L (ref 3.5–5.1)
Sodium: 141 mEq/L (ref 135–145)
Total Bilirubin: 0.6 mg/dL (ref 0.2–1.2)
Total Protein: 7.5 g/dL (ref 6.0–8.3)

## 2022-10-27 LAB — LIPID PANEL
Cholesterol: 158 mg/dL (ref 0–200)
HDL: 51 mg/dL (ref >39.00)
LDL Cholesterol: 85 mg/dL (ref 0–99)
NonHDL: 107.47
Total CHOL/HDL Ratio: 3
Triglycerides: 111 mg/dL (ref 0.0–149.0)
VLDL: 22.2 mg/dL (ref 0.0–40.0)

## 2022-10-27 LAB — CBC
HCT: 43.8 % (ref 39.0–52.0)
Hemoglobin: 15.1 g/dL (ref 13.0–17.0)
MCHC: 34.4 g/dL (ref 30.0–36.0)
MCV: 96.8 fl (ref 78.0–100.0)
Platelets: 263 10*3/uL (ref 150.0–400.0)
RBC: 4.52 Mil/uL (ref 4.22–5.81)
RDW: 14.5 % (ref 11.5–15.5)
WBC: 5.3 10*3/uL (ref 4.0–10.5)

## 2022-10-27 LAB — VITAMIN D 25 HYDROXY (VIT D DEFICIENCY, FRACTURES): VITD: 36.98 ng/mL (ref 30.00–100.00)

## 2022-10-27 LAB — HEMOGLOBIN A1C: Hgb A1c MFr Bld: 6.4 % (ref 4.6–6.5)

## 2022-10-27 LAB — TSH: TSH: 2.11 u[IU]/mL (ref 0.35–5.50)

## 2022-10-27 NOTE — Assessment & Plan Note (Signed)
Check vitamin D.

## 2022-10-27 NOTE — Assessment & Plan Note (Signed)
No longer on Lunesta.  Overall symptoms seem to be better controlled with over-the-counter melatonin and baclofen as needed.

## 2022-10-27 NOTE — Assessment & Plan Note (Signed)
Blood pressure at goal on amlodipine 5 mg daily and losartan 100 mg daily.  Continue home monitoring.

## 2022-10-27 NOTE — Assessment & Plan Note (Signed)
On Lipitor 80 daily.  Check labs today.

## 2022-10-27 NOTE — Assessment & Plan Note (Signed)
Follows with urology

## 2022-10-27 NOTE — Assessment & Plan Note (Signed)
Symptoms stable on Lexapro 20 mg daily.  Does not wish to make any medication adjustments today.

## 2022-10-27 NOTE — Patient Instructions (Signed)
It was very nice to see you today!  I am glad that you are doing well.  We will check blood work today.  Please let me know if you would like to see a physical therapist or sports medicine doctor.  Please continue to work on diet and exercise.  We will see you back in 6 months.  Please come back to see [place] sooner if needed.  Take care, Dr Jerline Pain  PLEASE NOTE:  If you had any lab tests, please let us know if you have not heard back within a few days. You may see your results on mychart before we have a chance to review them but we will give you a call once they are reviewed by [place].   If we ordered any referrals today, please let us know if you have not heard from their office within the next week.   If you had any urgent prescriptions sent in today, please check with the pharmacy within an hour of our visit to make sure the prescription was transmitted appropriately.   Please try these tips to maintain a healthy lifestyle:  Eat at least 3 REAL meals and 1-2 snacks per day.  Aim for no more than 5 hours between eating.  If you eat breakfast, please do so within one hour of getting up.   Each meal should contain half fruits/vegetables, one quarter protein, and one quarter carbs (no bigger than a computer mouse)  Cut down on sweet beverages. This includes juice, soda, and sweet tea.   Drink at least 1 glass of water with each meal and aim for at least 8 glasses per day  Exercise at least 150 minutes every week.

## 2022-10-27 NOTE — Assessment & Plan Note (Signed)
Check A1c 

## 2022-10-27 NOTE — Progress Notes (Signed)
   Austin [name] is a 82 y.o. male who presents today for an office visit.  Assessment/Plan:  New/Acute Problems: Upper Back [name] No red flags.  Symptoms have improved with baclofen and meloxicam.  We did discuss referral to PT or sports medicine however he deferred for now.  He will continue with his home exercises.  We discussed reasons to return to care.  Chronic Problems Addressed Today: Hyperglycemia Check A1c.  Vitamin D deficiency Check vitamin D.  Depression, major, single episode, moderate (HCC) Symptoms stable on Lexapro 20 mg daily.  Does not wish to make any medication adjustments today.  Prostate cancer [facility]) Follows with urology.  Dyslipidemia On Lipitor 80 daily.  Check labs today.  Insomnia No longer on Lunesta.  Overall symptoms seem to be better controlled with over-the-counter melatonin and baclofen as needed.  Essential hypertension Blood pressure at goal on amlodipine 5 mg daily and losartan 100 mg daily.  Continue home monitoring.     Subjective:  HPI:  See A/p for status of chronic conditions.   His main concern today is right upper neck. We saw him for this a few months ago and gave him a course of baclofen and meloxicam and symptoms improved. Still has occasional [name] but symptoms have improved.        Objective:  Physical Exam: BP 138/78   Pulse 78   Temp (!) 97.5 F (36.4 C) (Temporal)   Ht '5\' 9"'$  (1.753 m)   Wt 200 lb (90.7 kg)   SpO2 95%   BMI 29.53 kg/m   Gen: No acute distress, resting comfortably CV: Regular rate and rhythm with no murmurs appreciated Pulm: Normal work of breathing, clear to auscultation bilaterally with no crackles, wheezes, or rhonchi MSK:  - Neck: No deformities.  Full range of motion throughout.  Neurovascular intact distally.  Tenderness to palpation along right superior trapezius. Neuro: Grossly normal, moves all extremities Psych: Normal affect and thought content      Austin [name] M. Austin Pain, MD 10/27/2022  10:51 AM

## 2022-10-28 ENCOUNTER — Encounter: Payer: Self-pay | Admitting: Family Medicine

## 2022-10-29 NOTE — Progress Notes (Signed)
Please inform patient of the following:  Cholesterol blood sugar both borderline but stable.  He should continue to work on diet and exercise.  Do not need to make any medication changes.  He should come back in 6 months so that we can recheck.

## 2022-10-30 ENCOUNTER — Other Ambulatory Visit: Payer: Self-pay | Admitting: Family Medicine

## 2022-11-05 ENCOUNTER — Other Ambulatory Visit: Payer: Self-pay | Admitting: Family Medicine

## 2022-11-29 ENCOUNTER — Other Ambulatory Visit: Payer: Self-pay | Admitting: Family Medicine

## 2022-11-29 ENCOUNTER — Other Ambulatory Visit: Payer: Self-pay | Admitting: Internal Medicine

## 2022-11-29 DIAGNOSIS — J301 Allergic rhinitis due to pollen: Secondary | ICD-10-CM

## 2022-12-03 ENCOUNTER — Telehealth: Payer: Self-pay | Admitting: Family Medicine

## 2022-12-03 NOTE — Telephone Encounter (Signed)
  LAST APPOINTMENT DATE:  10/27/22  NEXT APPOINTMENT DATE:  MEDICATION: nitrofurantoin, macrocrystal-monohydrate, (MACROBID) 100 MG capsule    Is the patient out of medication? No  PHARMACY:  [id number] [address], [place] Phone: [phone]  Fax: [phone]      Let patient know to contact pharmacy at the end of the day to make sure medication is ready.  Please notify patient to allow 48-72 hours to process

## 2022-12-07 NOTE — Telephone Encounter (Signed)
HE does take this as a prophylactic but his urologist are the ones who prescribe. Recommend he check with them.

## 2022-12-07 NOTE — Telephone Encounter (Signed)
LMOVM advising pt of pcp comments listed below

## 2022-12-07 NOTE — Telephone Encounter (Signed)
Is this a chronic med? Please advise

## 2022-12-10 ENCOUNTER — Ambulatory Visit (INDEPENDENT_AMBULATORY_CARE_PROVIDER_SITE_OTHER): Payer: Medicare Other | Admitting: Nurse Practitioner

## 2022-12-10 ENCOUNTER — Encounter: Payer: Self-pay | Admitting: Nurse Practitioner

## 2022-12-10 VITALS — BP 130/74 | HR 81 | Ht 69.0 in | Wt 196.2 lb

## 2022-12-10 DIAGNOSIS — J069 Acute upper respiratory infection, unspecified: Secondary | ICD-10-CM | POA: Insufficient documentation

## 2022-12-10 DIAGNOSIS — R051 Acute cough: Secondary | ICD-10-CM | POA: Diagnosis not present

## 2022-12-10 DIAGNOSIS — J4489 Other specified chronic obstructive pulmonary disease: Secondary | ICD-10-CM

## 2022-12-10 DIAGNOSIS — J441 Chronic obstructive pulmonary disease with (acute) exacerbation: Secondary | ICD-10-CM

## 2022-12-10 LAB — POCT EXHALED NITRIC OXIDE: FeNO level (ppb): 16

## 2022-12-10 MED ORDER — PREDNISONE 20 MG PO TABS: 20.0000 mg | ORAL_TABLET | Freq: Every day | ORAL | 0 refills | Status: AC

## 2022-12-10 MED ORDER — BENZONATATE 200 MG PO CAPS: 200.0000 mg | ORAL_CAPSULE | Freq: Three times a day (TID) | ORAL | 1 refills | Status: DC | PRN

## 2022-12-10 NOTE — Assessment & Plan Note (Signed)
Mild exacerbation secondary to URI. We will treat him with prednisone burst. Target cough control measures. Hold on antimicrobial therapy as cough is non-productive and no other infectious symptoms. Action plan in place. Return precautions advised.   Patient Instructions  Continue breztri inhaler 2 puffs Twice daily, rinse after Continue albuterol neb 3 mL or inhaler 2 puffs every 6 hours as needed for shortness of breath or wheezing Continue loratidine 10 mg daily Continue singulair 10 mg At bedtime Continue omeprazole 40 mg daily for acid reflux Continue nasocort nasal spray 1 spray each nostril daily Continue saline nasal spray 2-3 times a day    Prednisone 20 mg daily for 5 days. Take in AM with food Mucinex DM Twice daily for cough/congestion Benzonatate 1 capsule Three times a day for cough    Follow up in 4 months with Dr. Shearon Stalls. If symptoms do not improve or worsen, please contact office for sooner follow up or seek emergency care.

## 2022-12-10 NOTE — Progress Notes (Signed)
$'@Patient'R$  ID: Austin [name], male    DOB: [birth year], 82 y.o.   MRN: [id number]  Chief Complaint  Patient presents with   Follow-up    Pt f/u states that he is feeling well and only his only question is regarding getting samples of Breztri    Referring provider: Vivi Barrack, MD  HPI: 82 year old male, former cigar smoker followed for asthma and COPD overlap syndrome.  He is a patient of Dr. Mauricio Po and was last seen in office on 05/12/2022.  Past medical history significant for hypertension, GERD, prostate cancer, depression.  TEST/EVENTS:  09/2018 CT chest: Air trapping, tracheobronchial malacia, mild bibasilar atelectasis 01/2020 PFTs: Moderately severe airflow limitation with FEV1 53% of predicted 01/21/2020 ACT: 13 08/13/2021 FeNO: 40 ppb  05/12/2022: OV with Dr. Shearon Stalls. Using [employer]. Having some tickling in his throat from post nasal drainage. Still taking nasacort. Add ipratropium   12/10/2022: Today - follow up Patient presents today for follow up. He was doing well up until the last 2-3 weeks. He had a cold with nasal congestion and drainage. These symptoms resolve but he is  still struggling with increased cough which is nonproductive and chest congestion. Breathing feels like it's at his baseline. Able to complete ADLs and walk at his own pace without difficulties. Denies fevers, chills, hemoptysis, night sweats. Continues on [place]. Hasn't required albuterol. Using flonase and astelin.  No Known Allergies  Immunization History  Administered Date(s) Administered   Fluad Quad(high Dose 65+) 07/24/2019   Influenza, High Dose Seasonal PF 05/31/2021, 07/13/2022   Influenza-Unspecified 06/04/2020   PFIZER(Purple Top)SARS-COV-2 Vaccination 11/23/2019, 12/17/2019, 07/26/2020   PNEUMOCOCCAL CONJUGATE-20 02/10/2021   Zoster Recombinat (Shingrix) 01/14/2022, 04/05/2022    Past Medical History:  Diagnosis Date   Asthma    Depression    Hypertension    Prostate cancer ([place])     Urinary incontinence     Tobacco History: Social History   Tobacco Use  Smoking Status Former   Types: Cigars   Quit date: 10/22/2019   Years since quitting: 3.1  Smokeless Tobacco Never  Tobacco Comments   smoked occ. with son. did not tolerate it long. 12/14/19.   Counseling given: Not Answered Tobacco comments: smoked occ. with son. did not tolerate it long. 12/14/19.    Outpatient Medications Prior to Visit  Medication Sig Dispense Refill   albuterol (PROVENTIL) (2.5 MG/3ML) 0.083% nebulizer solution USE 1 VIAL BY NEBULIZATION EVERY 4 HOURS AS NEEDED FOR WHEEZING OR SHORTNESS OF BREATH/DYSPNEA.  J45.998 375 mL 1   albuterol (VENTOLIN HFA) 108 (90 Base) MCG/ACT inhaler INHALE 1-2 PUFFS BY MOUTH EVERY 6 HOURS AS NEEDED FOR WHEEZE OR SHORTNESS OF BREATH 18 each 5   amLODipine (NORVASC) 5 MG tablet TAKE 1 TABLET (5 MG TOTAL) BY MOUTH DAILY. 90 tablet 3   atorvastatin (LIPITOR) 80 MG tablet TAKE 1 TABLET BY MOUTH EVERY DAY 90 tablet 1   baclofen (LIORESAL) 10 MG tablet Take 0.5-1 tablets (5-10 mg total) by mouth 4 (four) times daily as needed for muscle spasms. 30 each 0   Budeson-Glycopyrrol-Formoterol (BREZTRI AEROSPHERE) 160-9-4.8 MCG/ACT AERO Inhale 2 puffs into the lungs in the morning and at bedtime. 10.7 g 5   Cyanocobalamin (VITAMIN B 12 PO) Take 1,000 Units by mouth.     escitalopram (LEXAPRO) 20 MG tablet TAKE 1 TABLET BY MOUTH EVERY DAY 90 tablet 3   fenofibrate micronized (LOFIBRA) 200 MG capsule TAKE 1 CAPSULE BY MOUTH EVERY DAY 90 capsule 0   glucose blood (  FREESTYLE LITE) test strip CHECK BLOOD SUGAR THREE TIMES A DAY E11.9 100 strip 12   ibuprofen (ADVIL) 800 MG tablet TAKE 1 TABLET BY MOUTH EVERY 8 HOURS AS NEEDED 30 tablet 0   Lancets Misc. (UNISTIK 2 EXTRA) MISC Use to check blood sugar 3 times daily/ Dx E11.9 200 each 0   loratadine (CLARITIN) 10 MG tablet TAKE 1 TABLET BY MOUTH EVERY DAY 90 tablet 3   losartan (COZAAR) 100 MG tablet Take 1 tablet (100 mg total) by  mouth daily. 90 tablet 3   magnesium oxide (MAG-OX) 400 (240 Mg) MG tablet TAKE 1 TABLET BY MOUTH EVERY DAY 90 tablet 3   meloxicam (MOBIC) 15 MG tablet TAKE 1 TABLET (15 MG TOTAL) BY MOUTH DAILY. 30 tablet 0   montelukast (SINGULAIR) 10 MG tablet TAKE 1 TABLET BY MOUTH EVERYDAY AT BEDTIME 90 tablet 1   nitrofurantoin, macrocrystal-monohydrate, (MACROBID) 100 MG capsule Take 100 mg by mouth daily.     omeprazole (PRILOSEC) 20 MG capsule TAKE 2 CAPSULES BY MOUTH EVERY DAY 180 capsule 1   solifenacin (VESICARE) 5 MG tablet Take 5 mg by mouth daily.     Vitamin D, Cholecalciferol, 50 MCG (2000 UT) CAPS Take by mouth.     gabapentin (NEURONTIN) 100 MG capsule Take 1 capsule (100 mg total) by mouth at bedtime. (Patient not taking: Reported on 12/10/2022) 30 capsule 0   ipratropium (ATROVENT) 0.06 % nasal spray Place 2 sprays into both nostrils 3 (three) times daily. 15 mL 5   No facility-administered medications prior to visit.     Review of Systems:   Constitutional: No weight loss or gain, night sweats, fevers, chills, fatigue, or lassitude. HEENT: No headaches, difficulty swallowing, tooth/dental problems, or sore throat. No sneezing, itching, ear ache, nasal congestion. CV:  No chest pain, orthopnea, PND, swelling in lower extremities, anasarca, dizziness, palpitations, syncope Resp: +increased dry cough, chest congestion, baseline shortness of breath with exertion. No hemoptysis. No wheezing.  No chest wall deformity GI:  No heartburn, indigestion, abdominal pain, nausea, vomiting, diarrhea, change in bowel habits, loss of appetite, bloody stools.  GU: No dysuria, change in color of urine, urgency or frequency.  Skin: No rash, lesions, ulcerations MSK:  No joint pain or swelling.   Neuro: No dizziness or lightheadedness.  Psych: No depression or anxiety. Mood stable.     Physical Exam:  BP 130/74   Pulse 81   Ht '5\' 9"'$  (1.753 m)   Wt 196 lb 3.2 oz (89 kg)   SpO2 95%   BMI 28.97  kg/m   GEN: Pleasant, interactive, well-nourished; in no acute distress. HEENT:  Normocephalic and atraumatic. PERRLA. Sclera white. Nasal turbinates pink, moist and patent bilaterally. No rhinorrhea present. Oropharynx pink and moist, without exudate or edema.  NECK:  Supple w/ fair ROM. No JVD present. Normal carotid impulses w/o bruits. Thyroid symmetrical with no goiter or nodules palpated. No lymphadenopathy.   CV: RRR, no m/r/g, no peripheral edema. Pulses intact, +2 bilaterally. No cyanosis, pallor or clubbing. PULMONARY:  Unlabored, regular breathing. End expiratory wheeze bilaterally A&P. No accessory muscle use. No dullness to percussion. GI: BS present and normoactive. Soft, non-tender to palpation. No organomegaly or masses detected.  MSK: No erythema, warmth or tenderness. Cap refil <2 sec all extrem. No deformities or joint swelling noted.  Neuro: A/Ox3. No focal deficits noted.   Skin: Warm, no lesions or rashe Psych: Normal affect and behavior. Judgement and thought content appropriate.  Lab Results:  CBC    Component Value Date/Time   WBC 5.3 10/27/2022 1031   RBC 4.52 10/27/2022 1031   HGB 15.1 10/27/2022 1031   HCT 43.8 10/27/2022 1031   PLT 263.0 10/27/2022 1031   MCV 96.8 10/27/2022 1031   MCH 32.3 08/13/2020 1053   MCHC 34.4 10/27/2022 1031   RDW 14.5 10/27/2022 1031   LYMPHSABS 1.5 12/18/2021 1205   MONOABS 0.7 12/18/2021 1205   EOSABS 0.1 12/18/2021 1205   BASOSABS 0.0 12/18/2021 1205    BMET    Component Value Date/Time   NA 141 10/27/2022 1031   K 4.5 10/27/2022 1031   CL 103 10/27/2022 1031   CO2 30 10/27/2022 1031   GLUCOSE 122 (H) 10/27/2022 1031   BUN 16 10/27/2022 1031   CREATININE 0.66 10/27/2022 1031   CREATININE 0.65 (L) 08/13/2020 1053   CALCIUM 9.4 10/27/2022 1031    BNP No results found for: "BNP"   Imaging:  No results found.        No data to display          Lab Results  Component Value Date   NITRICOXIDE  40 01/17/2020        Assessment & Plan:   Asthma-COPD overlap syndrome (HCC) Mild exacerbation secondary to URI. We will treat him with prednisone burst. Target cough control measures. Hold on antimicrobial therapy as cough is non-productive and no other infectious symptoms. Action plan in place. Return precautions advised.   Patient Instructions  Continue breztri inhaler 2 puffs Twice daily, rinse after Continue albuterol neb 3 mL or inhaler 2 puffs every 6 hours as needed for shortness of breath or wheezing Continue loratidine 10 mg daily Continue singulair 10 mg At bedtime Continue omeprazole 40 mg daily for acid reflux Continue nasocort nasal spray 1 spray each nostril daily Continue saline nasal spray 2-3 times a day    Prednisone 20 mg daily for 5 days. Take in AM with food Mucinex DM Twice daily for cough/congestion Benzonatate 1 capsule Three times a day for cough    Follow up in 4 months with Dr. Shearon Stalls. If symptoms do not improve or worsen, please contact office for sooner follow up or seek emergency care.   Viral URI Viral URI 2-3 weeks ago. Resolved symptoms. See above.   I spent 35 minutes of dedicated to the care of this patient on the date of this encounter to include pre-visit review of records, face-to-face time with the patient discussing conditions above, post visit ordering of testing, clinical documentation with the electronic health record, making appropriate referrals as documented, and communicating necessary findings to members of the patients care team.   Clayton Bibles, NP 12/10/2022  Pt aware and understands NP's role.

## 2022-12-10 NOTE — Patient Instructions (Addendum)
Continue breztri inhaler 2 puffs Twice daily, rinse after Continue albuterol neb 3 mL or inhaler 2 puffs every 6 hours as needed for shortness of breath or wheezing Continue loratidine 10 mg daily Continue singulair 10 mg At bedtime Continue omeprazole 40 mg daily for acid reflux Continue nasocort nasal spray 1 spray each nostril daily Continue saline nasal spray 2-3 times a day    Prednisone 20 mg daily for 5 days. Take in AM with food Mucinex DM Twice daily for cough/congestion Benzonatate 1 capsule Three times a day for cough    Follow up in 4 months with Dr. Shearon Stalls. If symptoms do not improve or worsen, please contact office for sooner follow up or seek emergency care.

## 2022-12-10 NOTE — Assessment & Plan Note (Signed)
Viral URI 2-3 weeks ago. Resolved symptoms. See above.

## 2022-12-23 ENCOUNTER — Telehealth: Payer: Self-pay | Admitting: Family Medicine

## 2022-12-23 NOTE — Telephone Encounter (Signed)
Patient states he is letting Dr. Jerline Pain know that he is interested in Physical Therapy for his neck and lower back as discussed at last Office Visit.

## 2022-12-24 ENCOUNTER — Other Ambulatory Visit: Payer: Self-pay | Admitting: *Deleted

## 2022-12-24 DIAGNOSIS — M5090 Cervical disc disorder, unspecified, unspecified cervical region: Secondary | ICD-10-CM

## 2022-12-24 NOTE — Telephone Encounter (Signed)
Referral placed.

## 2023-01-11 NOTE — Therapy (Unsigned)
OUTPATIENT PHYSICAL THERAPY CERVICAL EVALUATION   Patient Name: Austin [name] MRN: [id number] DOB:October 27, 1940, 82 y.o., male Today's Date: 01/12/2023  END OF SESSION:  PT End of Session - 01/12/23 1056     Visit Number 1    Number of Visits 12    Date for PT Re-Evaluation 04/06/23    Authorization Type medicare    PT Start Time 1100    PT Stop Time 1140    PT Time Calculation (min) 40 min    Activity Tolerance Patient tolerated treatment well             Past Medical History:  Diagnosis Date   Asthma    Depression    Hypertension    Prostate cancer    Urinary incontinence    Past Surgical History:  Procedure Laterality Date   [place]   REPLACEMENT TOTAL KNEE BILATERAL  2001   2007   surgery replacement Bilateral 2005   Patient Active Problem List   Diagnosis Date Noted   Viral URI 12/10/2022   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   Seasonal allergic rhinitis 08/31/2021   Vitamin B12 deficiency 02/10/2021   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   Chronic low back pain 02/11/2020   Insomnia 02/11/2020   Hyperglycemia 11/08/2019   Vitamin D deficiency 11/08/2019   Essential hypertension 11/08/2019   Gastroesophageal reflux disease 11/08/2019   Depression, major, single episode, moderate 11/08/2019   Cervical disc disease 11/08/2019   Asthma, persistent controlled 11/08/2019   Prostate cancer 11/08/2019   Status post cataract extraction 11/08/2019   Dyslipidemia 11/08/2019    PCP: Vivi Barrack, MD  REFERRING PROVIDER: Vivi Barrack, MD  REFERRING DIAG: M50.90 (ICD-10-CM) - Cervical disc disease   THERAPY DIAG:  Cervicalgia - Plan: PT plan of care cert/re-cert  Muscle weakness (generalized) - Plan: PT plan of care cert/re-cert  Rationale for Evaluation and Treatment: Rehabilitation  ONSET DATE: a couple years  SUBJECTIVE:                                                                                                                                                                                                          SUBJECTIVE STATEMENT: Right sided neck pain reported and in right low back. States that the neck pain has been going on for 1-2 years with no change over the last 2 years. States he talked to MD about pain who suggested PT.  States neck pain is like a clunk and when he does his neck exercises he can get  like a clunking feeling when he moves it wrong. States certain movements make his neck pain worse.   Low back pain has been going on for a long time and he can't walk with his wife without his back pain and asthma. Reports that walking and standing makes his pain in his back worse. Pain is described as sharp at times but not sure if that goes into legs. States pain usually on the right.   States he [facility] to the senior center and takes tai chi and has no pain with this.    Hand dominance: Right  PERTINENT HISTORY:  Asthma, HTN, B TKR, B shoulder surgery (total shoulder per pt <5 years ago), diaphragmatic hernia repaired   PAIN:  Are you having pain? Yes no number provided but in right side of neck and low back   PRECAUTIONS: None  WEIGHT BEARING RESTRICTIONS: No  FALLS:  Has patient fallen in last 6 months? No   OCCUPATION: not working  PLOF: Independent  PATIENT GOALS: to have less pain  OBJECTIVE:   DIAGNOSTIC FINDINGS:  None at this time    COGNITION: Overall cognitive status: Within functional limits for tasks assessed  SENSATION: Not tested  POSTURE: rounded shoulders, forward head, and flexed trunk   PALPATION: Tenderness to palpation along right lumbar paraspinals and glutes   CERVICAL ROM:   Active ROM A/PROM (deg) eval  Flexion WFL  Extension WFL  Right lateral flexion 15*  Left lateral flexion 10*  Right rotation 45*  Left rotation 40*   (Blank rows = not tested) * Pulling sensation on the right   Lumber APROM:    01/12/2023       Flexion  50% limited     Extension  90% limited     R ROT       L ROT       R SB  75% limited    L SB 75% limited     * Pain   (Blank rows = not tested)    LE Measurements Lower Extremity Right 01/12/2023 Left 01/12/2023   A/PROM MMT A/PROM MMT  Hip Flexion WFL 4 WFL 4-  Hip Extension      Hip Abduction      Hip Adduction      Hip Internal rotation 30  5   Hip External rotation 45  20   Knee Flexion  4  4  Knee Extension  4-  4-  Ankle Dorsiflexion      Ankle Plantarflexion      Ankle Inversion      Ankle Eversion       (Blank rows = not tested) * pain    UE Measurements Upper Extremity Right 01/12/2023 Left 01/12/2023   A/PROM MMT A/PROM MMT  Shoulder Flexion 135* 4* 130 4  Shoulder Extension      Shoulder Abduction 120 (stiff) 4- 140 4  Shoulder Adduction      Shoulder Internal Rotation  4  4  Shoulder External Rotation Reaches to C7 SP (tight) 3+ Reaches to T2 SP 4-  Elbow Flexion Reaches to T12 SP (tight)   Reaches to T8 SP    Elbow Extension      Wrist Flexion      Wrist Extension      Wrist Supination      Wrist Pronation      Wrist Ulnar Deviation      Wrist Radial Deviation      Grip Strength NA  NA     (  Blank rows = not tested)   * pain   SPECIAL TESTS:  Slump test neg B Straight leg test neg B  Ely's + B  FUNCTIONAL TESTS:  Holds breath with transitional movements- abdominal hernia present  TODAY'S TREATMENT:                                                                                                                              DATE:   01/12/2023  Therapeutic Exercise:  Aerobic: Supine: LTR 2 minutes, piriformis stretch x2 30" holds B, cervical ROT AROM 2 minutes B Prone:lying on 2 pillows 3 minutes  Seated:  Standing: Neuromuscular Re-education: Manual Therapy: Therapeutic Activity: Self Care: Trigger Point Dry Needling:  Modalities:    PATIENT EDUCATION:  Education details: on current presentation, on HEP, on clinical  outcomes score and POC, on importance of motion in regards to complaints of stiffness.  Person educated: Patient Education method: Explanation, Demonstration, and Handouts Education comprehension: verbalized understanding   HOME EXERCISE PROGRAM: [id number]  ASSESSMENT:  CLINICAL IMPRESSION: Patient presents to physical therapy with complaints of neck and low back pain that has been present for a couple of years.  Patient limited in range of motion and strength that is contributing to current level of function.  Educated patient in benefits of physical therapy as well as basic home exercise program.  Answered all questions send patient would greatly benefit from skilled physical therapy at this time to improve overall function and quality of life.  OBJECTIVE IMPAIRMENTS: Abnormal gait, decreased activity tolerance, decreased knowledge of use of DME, difficulty walking, decreased ROM, decreased strength, improper body mechanics, postural dysfunction, and pain.   ACTIVITY LIMITATIONS: lifting, bending, standing, sleeping, transfers, and locomotion level  PARTICIPATION LIMITATIONS: meal prep, cleaning, and community activity  PERSONAL FACTORS: Fitness and 1-2 comorbidities: abdominal hernia, B TKA  are also affecting patient's functional outcome.   REHAB POTENTIAL: Good  CLINICAL DECISION MAKING: Stable/uncomplicated  EVALUATION COMPLEXITY: Low   GOALS: Goals reviewed with patient? yes  SHORT TERM GOALS: Target date: 02/23/2023  Patient will be independent in self management strategies to improve quality of life and functional outcomes. Baseline: New Program Goal status: INITIAL  2.  Patient will report at least 50% improvement in overall symptoms and/or function to demonstrate improved functional mobility Baseline: 0% better Goal status: INITIAL  3.  Patient will be able to demonstrate pain-free neck range of motion in all directions Baseline: Painful Goal status:  INITIAL      LONG TERM GOALS: Target date: 04/06/2023   Patient will report at least 75% improvement in overall symptoms and/or function to demonstrate improved functional mobility Baseline: 0% better Goal status: INITIAL  2.  Patient will demonstrate improved lumbar range of motion by least 25% in all directions Baseline: see above Goal status: INITIAL  3.  Patient will report performing daily mobility routine to reduce stiffness in neck and back Baseline: Not currently Goal status: INITIAL  PLAN:  PT FREQUENCY: 1-2x/week for total of 12 visits over 12 week certification period  PT DURATION: 12 weeks  PLANNED INTERVENTIONS: Therapeutic exercises, Therapeutic activity, Neuromuscular re-education, Balance training, Gait training, Patient/Family education, Self Care, Joint mobilization, Joint manipulation, Vestibular training, Canalith repositioning, Orthotic/Fit training, Aquatic Therapy, Dry Needling, Electrical stimulation, Spinal manipulation, Spinal mobilization, Cryotherapy, Moist heat, Traction, Ultrasound, and Re-evaluation  PLAN FOR NEXT SESSION: lumbar/hip/cervical ROM, LE MMT, try prone lying if tolerated   11:55 AM, 01/12/23 Jerene Pitch, DPT Physical Therapy with [facility]

## 2023-01-12 ENCOUNTER — Ambulatory Visit (INDEPENDENT_AMBULATORY_CARE_PROVIDER_SITE_OTHER): Payer: Medicare Other | Admitting: Physical Therapy

## 2023-01-12 ENCOUNTER — Encounter: Payer: Self-pay | Admitting: Physical Therapy

## 2023-01-12 DIAGNOSIS — M6281 Muscle weakness (generalized): Secondary | ICD-10-CM | POA: Diagnosis not present

## 2023-01-12 DIAGNOSIS — M542 Cervicalgia: Secondary | ICD-10-CM

## 2023-01-18 NOTE — Therapy (Signed)
OUTPATIENT PHYSICAL THERAPY TREATMENT NOTE   Patient Name: Austin [name] MRN: [id number] DOB:Oct 17, 1940, 82 y.o., male Today's Date: 01/18/2023  PCP: Ardith DarkParker, Caleb M, MD   REFERRING PROVIDER: Ardith DarkParker, Caleb M, MD  END OF SESSION:    Past Medical History:  Diagnosis Date   Asthma    Depression    Hypertension    Prostate cancer    Urinary incontinence    Past Surgical History:  Procedure Laterality Date   GALLBLADDER SURGERY  1997   REPLACEMENT TOTAL KNEE BILATERAL  2001   2007   surgery replacement Bilateral 2005   Patient Active Problem List   Diagnosis Date Noted   Viral URI 12/10/2022   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   Seasonal allergic rhinitis 08/31/2021   Vitamin B12 deficiency 02/10/2021   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  REFERRING DIAG: M50.90 (ICD-10-CM) - Cervical disc disease     Rationale for Evaluation and Treatment: Rehabilitation   ONSET DATE: a couple years   SUBJECTIVE:                                                                                                                                                                                                          SUBJECTIVE STATEMENT: 01/18/2023 *** Eval:  Right sided neck pain reported and in right low back. States that the neck pain has been going on for 1-2 years with no change over the last 2 years. States he talked to MD about pain who suggested PT.  States neck pain is like a  clunk and when he does his neck exercises he can get like a clunking feeling when he moves it wrong. States certain movements make his neck pain worse.    Low back pain has been going on for a long time and he can't walk with his wife without his back pain and asthma. Reports that walking and standing makes his pain in his back worse. Pain is described as sharp at times but not sure if that goes into legs. States pain usually on the right.    States [facility] the [facility] center and takes [facility] chi and has no pain with this.      Hand dominance: Right   PERTINENT HISTORY:  Asthma, HTN, B TKR, B shoulder surgery (total shoulder per pt <5 years ago), diaphragmatic hernia repaired    PAIN:  Are you having pain? Yes no number provided but in right side of neck and low back    PRECAUTIONS: None   WEIGHT BEARING RESTRICTIONS: No   FALLS:  Has patient fallen in last 6 months? No     OCCUPATION: not working   PLOF: Independent   PATIENT GOALS: to have less pain   OBJECTIVE:    DIAGNOSTIC FINDINGS:  None at this time       COGNITION: Overall cognitive status: Within functional limits for tasks assessed   SENSATION: Not tested   POSTURE: rounded shoulders, forward head, and flexed trunk    PALPATION: Tenderness to palpation along right lumbar paraspinals and glutes      CERVICAL ROM:    Active ROM A/PROM (deg) eval  Flexion WFL  Extension WFL  Right lateral flexion 15*  Left lateral flexion 10*  Right rotation 45*  Left rotation 40*   (Blank rows = not tested) * Pulling sensation on the right              Lumber APROM:    01/12/2023      Flexion  50% limited     Extension  90% limited     R ROT       L ROT       R SB  75% limited    L SB 75% limited                          * Pain              (Blank rows = not tested)       LE Measurements       Lower Extremity Right 01/12/2023 Left 01/12/2023    A/PROM MMT A/PROM MMT  Hip Flexion WFL 4 WFL 4-  Hip  Extension          Hip Abduction          Hip Adduction          Hip Internal rotation 30   5    Hip External rotation 45   20    Knee Flexion   4   4  Knee Extension   4-   4-  Ankle Dorsiflexion          Ankle Plantarflexion          Ankle Inversion          Ankle Eversion           (Blank rows = not tested) * pain               UE Measurements       Upper Extremity Right 01/12/2023 Left 01/12/2023    A/PROM MMT A/PROM MMT  Shoulder Flexion 135* 4* 130 4  Shoulder Extension          Shoulder Abduction 120 (stiff) 4- 140 4  Shoulder Adduction          Shoulder Internal Rotation   4   4  Shoulder External Rotation Reaches to C7 SP (tight) 3+ Reaches to T2 SP 4-  Elbow Flexion Reaches to T12 SP (tight)    Reaches to T8  SP     Elbow Extension          Wrist Flexion          Wrist Extension          Wrist Supination          Wrist Pronation          Wrist Ulnar Deviation          Wrist Radial Deviation          Grip Strength NA   NA                          (Blank rows = not tested)                       * pain     SPECIAL TESTS:  Slump test neg B Straight leg test neg B  Ely's + B   FUNCTIONAL TESTS:  Holds breath with transitional movements- abdominal hernia present   TODAY'S TREATMENT:                                                                                                                              DATE:   01/18/2023    Therapeutic Exercise:    Aerobic: Supine: LTR 2 minutes, piriformis stretch x2 30" holds B, cervical ROT AROM 2 minutes B Prone:lying on 2 pillows 3 minutes    Seated:    Standing: Neuromuscular Re-education: Manual Therapy: Therapeutic Activity: Self Care: Trigger Point Dry Needling:  Modalities:      PATIENT EDUCATION:  Education details: on current presentation, on HEP, on clinical outcomes score and POC, on importance of motion in regards to complaints of stiffness.  Person educated: Patient Education method: Explanation,  Demonstration, and Handouts Education comprehension: verbalized understanding     HOME EXERCISE PROGRAM: [id number]   ASSESSMENT:   CLINICAL IMPRESSION: 01/18/2023  Eval: Patient presents to physical therapy with complaints of neck and low back pain that has been present for a couple of years.  Patient limited in range of motion and strength that is contributing to current level of function.  Educated patient in benefits of physical therapy as well as basic home exercise program.  Answered all questions send patient would greatly benefit from skilled physical therapy at this time to improve overall function and quality of life.   OBJECTIVE IMPAIRMENTS: Abnormal gait, decreased activity tolerance, decreased knowledge of use of DME, difficulty walking, decreased ROM, decreased strength, improper body mechanics, postural dysfunction, and pain.    ACTIVITY LIMITATIONS: lifting, bending, standing, sleeping, transfers, and locomotion level   PARTICIPATION LIMITATIONS: meal prep, cleaning, and community activity   PERSONAL FACTORS: Fitness and 1-2 comorbidities: abdominal hernia, B TKA  are also affecting patient's functional outcome.    REHAB POTENTIAL: Good   CLINICAL DECISION MAKING: Stable/uncomplicated   EVALUATION COMPLEXITY: Low     GOALS: Goals reviewed with  patient? yes   SHORT TERM GOALS: Target date: 02/23/2023  Patient will be independent in self management strategies to improve quality of life and functional outcomes. Baseline: New Program Goal status: INITIAL   2.  Patient will report at least 50% improvement in overall symptoms and/or function to demonstrate improved functional mobility Baseline: 0% better Goal status: INITIAL   3.  Patient will be able to demonstrate pain-free neck range of motion in all directions Baseline: Painful Goal status: INITIAL           LONG TERM GOALS: Target date: 04/06/2023    Patient will report at least 75% improvement in overall  symptoms and/or function to demonstrate improved functional mobility Baseline: 0% better Goal status: INITIAL   2.  Patient will demonstrate improved lumbar range of motion by least 25% in all directions Baseline: see above Goal status: INITIAL   3.  Patient will report performing daily mobility routine to reduce stiffness in neck and back Baseline: Not currently Goal status: INITIAL       PLAN:   PT FREQUENCY: 1-2x/week for total of 12 visits over 12 week certification period   PT DURATION: 12 weeks   PLANNED INTERVENTIONS: Therapeutic exercises, Therapeutic activity, Neuromuscular re-education, Balance training, Gait training, Patient/Family education, Self Care, Joint mobilization, Joint manipulation, Vestibular training, Canalith repositioning, Orthotic/Fit training, Aquatic Therapy, Dry Needling, Electrical stimulation, Spinal manipulation, Spinal mobilization, Cryotherapy, Moist heat, Traction, Ultrasound, and Re-evaluation   PLAN FOR NEXT SESSION: lumbar/hip/cervical ROM, LE MMT, try prone lying if tolerated     1:32 PM, 01/18/23 Tereasa Coop, DPT Physical Therapy with [facility]

## 2023-01-19 ENCOUNTER — Encounter: Payer: Self-pay | Admitting: Physical Therapy

## 2023-01-19 ENCOUNTER — Ambulatory Visit (INDEPENDENT_AMBULATORY_CARE_PROVIDER_SITE_OTHER): Payer: Medicare Other | Admitting: Physical Therapy

## 2023-01-19 DIAGNOSIS — M542 Cervicalgia: Secondary | ICD-10-CM

## 2023-01-19 DIAGNOSIS — M6281 Muscle weakness (generalized): Secondary | ICD-10-CM

## 2023-01-20 ENCOUNTER — Ambulatory Visit (INDEPENDENT_AMBULATORY_CARE_PROVIDER_SITE_OTHER): Payer: Medicare Other | Admitting: Physical Therapy

## 2023-01-20 ENCOUNTER — Encounter: Payer: Self-pay | Admitting: Physical Therapy

## 2023-01-20 DIAGNOSIS — M542 Cervicalgia: Secondary | ICD-10-CM | POA: Diagnosis not present

## 2023-01-20 DIAGNOSIS — M6281 Muscle weakness (generalized): Secondary | ICD-10-CM

## 2023-01-20 NOTE — Therapy (Signed)
OUTPATIENT PHYSICAL THERAPY TREATMENT NOTE   Patient Name: Austin [name] MRN: [id number] DOB:23-Jul-1941, 82 y.o., male Today's Date: 01/20/2023  PCP: Ardith Dark, MD   REFERRING PROVIDER: Ardith Dark, MD  END OF SESSION:   PT End of Session - 01/20/23 0932     Visit Number 3    Number of Visits 12    Date for PT Re-Evaluation 04/06/23    Authorization Type medicare    PT Start Time 0934    PT Stop Time 1012    PT Time Calculation (min) 38 min    Activity Tolerance Patient tolerated treatment well             Past Medical History:  Diagnosis Date   Asthma    Depression    Hypertension    Prostate cancer    Urinary incontinence    Past Surgical History:  Procedure Laterality Date   GALLBLADDER SURGERY  1997   REPLACEMENT TOTAL KNEE BILATERAL  2001   2007   surgery replacement Bilateral 2005   Patient Active Problem List   Diagnosis Date Noted   Viral URI 12/10/2022   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   Seasonal allergic rhinitis 08/31/2021   Vitamin B12 deficiency 02/10/2021   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   Chronic low back pain 02/11/2020   Insomnia 02/11/2020   Hyperglycemia 11/08/2019   Vitamin D deficiency 11/08/2019   Essential hypertension 11/08/2019   Gastroesophageal reflux disease 11/08/2019   Depression, major, single episode, moderate 11/08/2019   Cervical disc disease 11/08/2019   Asthma, persistent controlled 11/08/2019   Prostate cancer 11/08/2019   Status post cataract extraction 11/08/2019   Dyslipidemia 11/08/2019    THERAPY DIAG:  Cervicalgia  Muscle weakness (generalized)  REFERRING DIAG: M50.90 (ICD-10-CM) - Cervical disc disease     Rationale for Evaluation and Treatment: Rehabilitation   ONSET DATE: a couple years   SUBJECTIVE:                                                                                                                                                                                                           SUBJECTIVE STATEMENT: 01/20/2023 States that he feels stiff on the other side of his neck. States he didn't do his new exercises. States that he still wants to be able to walk with his wife in the morning.   Eval:  Right sided neck pain reported and in right low back. States that the neck pain has been going on for 1-2 years with no change  over the last 2 years. States he talked to MD about pain who suggested PT.  States neck pain is like a clunk and when he does his neck exercises he can get like a clunking feeling when he moves it wrong. States certain movements make his neck pain worse.    Low back pain has been going on for a long time and he can't walk with his wife without his back pain and asthma. Reports that walking and standing makes his pain in his back worse. Pain is described as sharp at times but not sure if that goes into legs. States pain usually on the right.    States he goes to the senior center and takes tai chi and has no pain with this.      Hand dominance: Right   PERTINENT HISTORY:  Asthma, HTN, B TKR, B shoulder surgery (total shoulder per pt <5 years ago), diaphragmatic hernia repaired    PAIN:  Are you having pain? Yes no number provided but in both buttucks   PRECAUTIONS: None   WEIGHT BEARING RESTRICTIONS: No   FALLS:  Has patient fallen in last 6 months? No     OCCUPATION: not working   PLOF: Independent   PATIENT GOALS: to have less pain   OBJECTIVE:    DIAGNOSTIC FINDINGS:  None at this time       COGNITION: Overall cognitive status: Within functional limits for tasks assessed   SENSATION: Not tested   POSTURE: rounded shoulders, forward head, and flexed trunk    PALPATION: Tenderness to palpation along right lumbar paraspinals and glutes      CERVICAL ROM:    Active ROM A/PROM (deg) eval  Flexion WFL  Extension WFL  Right lateral flexion 15*  Left lateral flexion 10*  Right  rotation 45*  Left rotation 40*   (Blank rows = not tested) * Pulling sensation on the right              Lumber APROM:    01/12/2023      Flexion  50% limited     Extension  90% limited     R ROT       L ROT       R SB  75% limited    L SB 75% limited                          * Pain              (Blank rows = not tested)       LE Measurements       Lower Extremity Right 01/12/2023 Left 01/12/2023    A/PROM MMT A/PROM MMT  Hip Flexion WFL 4 WFL 4-  Hip Extension          Hip Abduction          Hip Adduction          Hip Internal rotation 30   5    Hip External rotation 45   20    Knee Flexion   4   4  Knee Extension   4-   4-  Ankle Dorsiflexion          Ankle Plantarflexion          Ankle Inversion          Ankle Eversion           (Blank rows = not tested) * pain  UE Measurements       Upper Extremity Right 01/12/2023 Left 01/12/2023    A/PROM MMT A/PROM MMT  Shoulder Flexion 135* 4* 130 4  Shoulder Extension          Shoulder Abduction 120 (stiff) 4- 140 4  Shoulder Adduction          Shoulder Internal Rotation   4   4  Shoulder External Rotation Reaches to C7 SP (tight) 3+ Reaches to T2 SP 4-  Elbow Flexion Reaches to T12 SP (tight)    Reaches to T8 SP     Elbow Extension          Wrist Flexion          Wrist Extension          Wrist Supination          Wrist Pronation          Wrist Ulnar Deviation          Wrist Radial Deviation          Grip Strength NA   NA                          (Blank rows = not tested)                       * pain     SPECIAL TESTS:  Slump test neg B Straight leg test neg B  Ely's + B   FUNCTIONAL TESTS:  Holds breath with transitional movements- abdominal hernia present   TODAY'S TREATMENT:                                                                                                                              DATE:   01/20/2023   Therapeutic Exercise:    Aerobic: Supine: LTR 2 minutes, piriformis  stretch x4 30" holds B,   Prone:    Seated: shoulder flexion with cane inhale up exhale down 4x5 slow and controlled    Standing:  Neuromuscular Re-education: Manual Therapy: Gait training: with cane - or with walking stick - tall posture and cues to stand up tall 8 minutes, walking on treadmill up to 1.0 MPH 1 hand hold 5 minutes Self Care: Trigger Point Dry Needling:  Modalities:      PATIENT EDUCATION:  Education details: on HEP, on starting walking program with wife and use of cane or walking stick. On when and how to perform exercise Person educated: Patient Education method: Explanation, Demonstration, and Handouts Education comprehension: verbalized understanding     HOME EXERCISE PROGRAM: [id number]   ASSESSMENT:   CLINICAL IMPRESSION: 01/20/2023 Session focused on low back exercises supine and patient instructed to perform first thing in morning to help with low back pain as pain was abolished after piriformis stretch and LTR. Transitioned to walking exercises as patient with desire to start walking with wife but  has difficulty breathing and keeping up. Educated patient in use of walking stick and or cane when walking as it helps keep him upright and reduced his RR with activity. Overall patient doing well, required use of inhaler with seated exercises as overall the pollen has been bothering him per patient. Instructed patient to continue to bring inhalers to apts. Will continue with current POC as tolerated.   Eval: Patient presents to physical therapy with complaints of neck and low back pain that has been present for a couple of years.  Patient limited in range of motion and strength that is contributing to current level of function.  Educated patient in benefits of physical therapy as well as basic home exercise program.  Answered all questions send patient would greatly benefit from skilled physical therapy at this time to improve overall function and quality of life.    OBJECTIVE IMPAIRMENTS: Abnormal gait, decreased activity tolerance, decreased knowledge of use of DME, difficulty walking, decreased ROM, decreased strength, improper body mechanics, postural dysfunction, and pain.    ACTIVITY LIMITATIONS: lifting, bending, standing, sleeping, transfers, and locomotion level   PARTICIPATION LIMITATIONS: meal prep, cleaning, and community activity   PERSONAL FACTORS: Fitness and 1-2 comorbidities: abdominal hernia, B TKA  are also affecting patient's functional outcome.    REHAB POTENTIAL: Good   CLINICAL DECISION MAKING: Stable/uncomplicated   EVALUATION COMPLEXITY: Low     GOALS: Goals reviewed with patient? yes   SHORT TERM GOALS: Target date: 02/23/2023  Patient will be independent in self management strategies to improve quality of life and functional outcomes. Baseline: New Program Goal status: INITIAL   2.  Patient will report at least 50% improvement in overall symptoms and/or function to demonstrate improved functional mobility Baseline: 0% better Goal status: INITIAL   3.  Patient will be able to demonstrate pain-free neck range of motion in all directions Baseline: Painful Goal status: INITIAL           LONG TERM GOALS: Target date: 04/06/2023    Patient will report at least 75% improvement in overall symptoms and/or function to demonstrate improved functional mobility Baseline: 0% better Goal status: INITIAL   2.  Patient will demonstrate improved lumbar range of motion by least 25% in all directions Baseline: see above Goal status: INITIAL   3.  Patient will report performing daily mobility routine to reduce stiffness in neck and back Baseline: Not currently Goal status: INITIAL       PLAN:   PT FREQUENCY: 1-2x/week for total of 12 visits over 12 week certification period   PT DURATION: 12 weeks   PLANNED INTERVENTIONS: Therapeutic exercises, Therapeutic activity, Neuromuscular re-education, Balance training, Gait  training, Patient/Family education, Self Care, Joint mobilization, Joint manipulation, Vestibular training, Canalith repositioning, Orthotic/Fit training, Aquatic Therapy, Dry Needling, Electrical stimulation, Spinal manipulation, Spinal mobilization, Cryotherapy, Moist heat, Traction, Ultrasound, and Re-evaluation   PLAN FOR NEXT SESSION: lumbar/hip/cervical ROM, LE MMT, prone not tolerated     10:12 AM, 01/20/23 Tereasa Coop, DPT Physical Therapy with [facility]

## 2023-01-26 ENCOUNTER — Encounter: Payer: Self-pay | Admitting: Physical Therapy

## 2023-01-26 ENCOUNTER — Ambulatory Visit (INDEPENDENT_AMBULATORY_CARE_PROVIDER_SITE_OTHER): Payer: Medicare Other | Admitting: Physical Therapy

## 2023-01-26 DIAGNOSIS — M542 Cervicalgia: Secondary | ICD-10-CM | POA: Diagnosis not present

## 2023-01-26 DIAGNOSIS — M6281 Muscle weakness (generalized): Secondary | ICD-10-CM

## 2023-01-26 NOTE — Therapy (Signed)
OUTPATIENT PHYSICAL THERAPY TREATMENT NOTE   Patient Name: Austin [name] MRN: [id number] DOB:[birth year], 82 y.o., male Today's Date: 01/26/2023  PCP: Ardith Dark, MD   REFERRING PROVIDER: Ardith Dark, MD  END OF SESSION:   PT End of Session - 01/26/23 1018     Visit Number 4    Number of Visits 12    Date for PT Re-Evaluation 04/06/23    Authorization Type medicare    PT Start Time 1018    PT Stop Time 1056    PT Time Calculation (min) 38 min    Activity Tolerance Patient tolerated treatment well             Past Medical History:  Diagnosis Date   Asthma    Depression    Hypertension    Prostate cancer    Urinary incontinence    Past Surgical History:  Procedure Laterality Date   GALLBLADDER SURGERY  1997   REPLACEMENT TOTAL KNEE BILATERAL  2001   2007   surgery replacement Bilateral 2005   Patient Active Problem List   Diagnosis Date Noted   Viral URI 12/10/2022   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   Seasonal allergic rhinitis 08/31/2021   Vitamin B12 deficiency 02/10/2021   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   Chronic low back pain 02/11/2020   Insomnia 02/11/2020   Hyperglycemia 11/08/2019   Vitamin D deficiency 11/08/2019   Essential hypertension 11/08/2019   Gastroesophageal reflux disease 11/08/2019   Depression, major, single episode, moderate 11/08/2019   Cervical disc disease 11/08/2019   Asthma, persistent controlled 11/08/2019   Prostate cancer 11/08/2019   Status post cataract extraction 11/08/2019   Dyslipidemia 11/08/2019    THERAPY DIAG:  Cervicalgia  Muscle weakness (generalized)  REFERRING DIAG: M50.90 (ICD-10-CM) - Cervical disc disease     Rationale for Evaluation and Treatment: Rehabilitation   ONSET DATE: a couple years   SUBJECTIVE:                                                                                                                                                                                                           SUBJECTIVE STATEMENT: 01/26/2023 States that he feels like he stretched his neck too much and now it is hurting along the left side. States that his low back is also stiff. States he has been using his walking cane and he felt like he could walk a little bit more with his wife. States that he feels like he is moving like a little old man because  he takes small steps.   Eval:  Right sided neck pain reported and in right low back. States that the neck pain has been going on for 1-2 years with no change over the last 2 years. States he talked to MD about pain who suggested PT.  States neck pain is like a clunk and when he does his neck exercises he can get like a clunking feeling when he moves it wrong. States certain movements make his neck pain worse.    Low back pain has been going on for a long time and he can't walk with his wife without his back pain and asthma. Reports that walking and standing makes his pain in his back worse. Pain is described as sharp at times but not sure if that goes into legs. States pain usually on the right.    States [facility] the [facility] and takes [facility] chi and has no pain with this.      Hand dominance: Right   PERTINENT HISTORY:  Asthma, HTN, B TKR, B shoulder surgery (total shoulder per pt <5 years ago), diaphragmatic hernia repaired    PAIN:  Are you having pain? Yes neck is 8/10 in neck stiffness, buttocks 6/10 stiffness    PRECAUTIONS: None   WEIGHT BEARING RESTRICTIONS: No   FALLS:  Has patient fallen in last 6 months? No     OCCUPATION: not working   PLOF: Independent   PATIENT GOALS: to have less pain   OBJECTIVE:    DIAGNOSTIC FINDINGS:  None at this time   COGNITION: Overall cognitive status: Within functional limits for tasks assessed   SENSATION: Not tested   POSTURE: rounded shoulders, forward head, and flexed trunk    PALPATION: Tenderness to palpation along right  lumbar paraspinals and glutes      CERVICAL ROM:    Active ROM A/PROM (deg) eval  Flexion WFL  Extension WFL  Right lateral flexion 15*  Left lateral flexion 10*  Right rotation 45*  Left rotation 40*   (Blank rows = not tested) * Pulling sensation on the right              Lumber APROM:    01/12/2023      Flexion  50% limited     Extension  90% limited     R ROT       L ROT       R SB  75% limited    L SB 75% limited                          * Pain              (Blank rows = not tested)       LE Measurements       Lower Extremity Right 01/12/2023 Left 01/12/2023    A/PROM MMT A/PROM MMT  Hip Flexion WFL 4 WFL 4-  Hip Extension          Hip Abduction          Hip Adduction          Hip Internal rotation 30   5    Hip External rotation 45   20    Knee Flexion   4   4  Knee Extension   4-   4-  Ankle Dorsiflexion          Ankle Plantarflexion  Ankle Inversion          Ankle Eversion           (Blank rows = not tested) * pain               UE Measurements       Upper Extremity Right 01/12/2023 Left 01/12/2023    A/PROM MMT A/PROM MMT  Shoulder Flexion 135* 4* 130 4  Shoulder Extension          Shoulder Abduction 120 (stiff) 4- 140 4  Shoulder Adduction          Shoulder Internal Rotation   4   4  Shoulder External Rotation Reaches to C7 SP (tight) 3+ Reaches to T2 SP 4-  Elbow Flexion Reaches to T12 SP (tight)    Reaches to T8 SP     Elbow Extension          Wrist Flexion          Wrist Extension          Wrist Supination          Wrist Pronation          Wrist Ulnar Deviation          Wrist Radial Deviation          Grip Strength NA   NA                          (Blank rows = not tested)                       * pain     SPECIAL TESTS:  Slump test neg B Straight leg test neg B  Ely's + B   FUNCTIONAL TESTS:  Holds breath with transitional movements- abdominal hernia present   TODAY'S TREATMENT:                                                                                                                               DATE:   01/26/2023 Therapeutic Exercise:    Aerobic: Supine: LTR 2 minutes, piriformis stretch pain in groin stopped- felt better after bent knee fall outs x3 30" holds B, bent knee fall outs 2 minutes, windshield wipers 3 minutes Prone:    Seated: hip add iso 2 minutes, hip abd iso green band 2 minutes, shoulder rolls 2 minutes, assisted chin tuck with ball 2 minutes, cervical ROT 2 minutes    Standing:  Neuromuscular Re-education: Manual Therapy: Gait training: Self Care: Trigger Point Dry Needling:  Modalities:      PATIENT EDUCATION:  Education details: on HEP Person educated: Patient Education method: Explanation, Demonstration, and Handouts Education comprehension: verbalized understanding     HOME EXERCISE PROGRAM: [id number]   ASSESSMENT:   CLINICAL IMPRESSION: 01/26/2023 Session focused initially on low back tightness. Tolerated well after groin stretches. Transitioned to seated muscle activation exercises which  where also tolerated well and reported to have less tightness sin glutes afterwards. Continued with cervical exercises but patient with tightness with all exercises. Added new exercises to HEP and provided patient with band for HEP adherence. Will continue with current POC as tolerated.   Eval: Patient presents to physical therapy with complaints of neck and low back pain that has been present for a couple of years.  Patient limited in range of motion and strength that is contributing to current level of function.  Educated patient in benefits of physical therapy as well as basic home exercise program.  Answered all questions send patient would greatly benefit from skilled physical therapy at this time to improve overall function and quality of life.   OBJECTIVE IMPAIRMENTS: Abnormal gait, decreased activity tolerance, decreased knowledge of use of DME, difficulty walking, decreased ROM,  decreased strength, improper body mechanics, postural dysfunction, and pain.    ACTIVITY LIMITATIONS: lifting, bending, standing, sleeping, transfers, and locomotion level   PARTICIPATION LIMITATIONS: meal prep, cleaning, and community activity   PERSONAL FACTORS: Fitness and 1-2 comorbidities: abdominal hernia, B TKA  are also affecting patient's functional outcome.    REHAB POTENTIAL: Good   CLINICAL DECISION MAKING: Stable/uncomplicated   EVALUATION COMPLEXITY: Low     GOALS: Goals reviewed with patient? yes   SHORT TERM GOALS: Target date: 02/23/2023  Patient will be independent in self management strategies to improve quality of life and functional outcomes. Baseline: New Program Goal status: INITIAL   2.  Patient will report at least 50% improvement in overall symptoms and/or function to demonstrate improved functional mobility Baseline: 0% better Goal status: INITIAL   3.  Patient will be able to demonstrate pain-free neck range of motion in all directions Baseline: Painful Goal status: INITIAL           LONG TERM GOALS: Target date: 04/06/2023    Patient will report at least 75% improvement in overall symptoms and/or function to demonstrate improved functional mobility Baseline: 0% better Goal status: INITIAL   2.  Patient will demonstrate improved lumbar range of motion by least 25% in all directions Baseline: see above Goal status: INITIAL   3.  Patient will report performing daily mobility routine to reduce stiffness in neck and back Baseline: Not currently Goal status: INITIAL       PLAN:   PT FREQUENCY: 1-2x/week for total of 12 visits over 12 week certification period   PT DURATION: 12 weeks   PLANNED INTERVENTIONS: Therapeutic exercises, Therapeutic activity, Neuromuscular re-education, Balance training, Gait training, Patient/Family education, Self Care, Joint mobilization, Joint manipulation, Vestibular training, Canalith repositioning,  Orthotic/Fit training, Aquatic Therapy, Dry Needling, Electrical stimulation, Spinal manipulation, Spinal mobilization, Cryotherapy, Moist heat, Traction, Ultrasound, and Re-evaluation   PLAN FOR NEXT SESSION: lumbar/hip/cervical ROM, LE MMT, prone not tolerated     10:57 AM, 01/26/23 Tereasa Coop, DPT Physical Therapy with [facility]

## 2023-01-27 ENCOUNTER — Encounter: Payer: Medicare Other | Admitting: Physical Therapy

## 2023-02-02 ENCOUNTER — Encounter: Payer: Self-pay | Admitting: Physical Therapy

## 2023-02-02 ENCOUNTER — Ambulatory Visit (INDEPENDENT_AMBULATORY_CARE_PROVIDER_SITE_OTHER): Payer: Medicare Other | Admitting: Physical Therapy

## 2023-02-02 DIAGNOSIS — M542 Cervicalgia: Secondary | ICD-10-CM

## 2023-02-02 DIAGNOSIS — M6281 Muscle weakness (generalized): Secondary | ICD-10-CM

## 2023-02-02 NOTE — Therapy (Signed)
OUTPATIENT PHYSICAL THERAPY TREATMENT NOTE   Patient Name: Austin [name] MRN: [id number] DOB:May 12, 1941, 82 y.o., male Today's Date: 02/02/2023  PCP: Ardith Dark, MD   REFERRING PROVIDER: Ardith Dark, MD  END OF SESSION:   PT End of Session - 02/02/23 1434     Visit Number 5    Number of Visits 12    Date for PT Re-Evaluation 04/06/23    Authorization Type medicare    PT Start Time 1436    PT Stop Time 1514    PT Time Calculation (min) 38 min    Activity Tolerance Patient tolerated treatment well             Past Medical History:  Diagnosis Date   Asthma    Depression    Hypertension    Prostate cancer    Urinary incontinence    Past Surgical History:  Procedure Laterality Date   GALLBLADDER SURGERY  1997   REPLACEMENT TOTAL KNEE BILATERAL  2001   2007   surgery replacement Bilateral 2005   Patient Active Problem List   Diagnosis Date Noted   Viral URI 12/10/2022   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   Seasonal allergic rhinitis 08/31/2021   Vitamin B12 deficiency 02/10/2021   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   Chronic low back pain 02/11/2020   Insomnia 02/11/2020   Hyperglycemia 11/08/2019   Vitamin D deficiency 11/08/2019   Essential hypertension 11/08/2019   Gastroesophageal reflux disease 11/08/2019   Depression, major, single episode, moderate 11/08/2019   Cervical disc disease 11/08/2019   Asthma, persistent controlled 11/08/2019   Prostate cancer 11/08/2019   Status post cataract extraction 11/08/2019   Dyslipidemia 11/08/2019    THERAPY DIAG:  Cervicalgia  Muscle weakness (generalized)  REFERRING DIAG: M50.90 (ICD-10-CM) - Cervical disc disease     Rationale for Evaluation and Treatment: Rehabilitation   ONSET DATE: a couple years   SUBJECTIVE:                                                                                                                                                                                                           SUBJECTIVE STATEMENT: 02/02/2023 States that he has some difficulties lifting his leg on the other one (piriformis) and laying on his stomach is uncomfortable. States his neck is starting to crack a little bit more. States that the neck feels about the same otherwise. States he walked a little bit today and still gets really stiff.   Eval:  Right sided neck pain reported and in  right low back. States that the neck pain has been going on for 1-2 years with no change over the last 2 years. States he talked to MD about pain who suggested PT.  States neck pain is like a clunk and when he does his neck exercises he can get like a clunking feeling when he moves it wrong. States certain movements make his neck pain worse.    Low back pain has been going on for a long time and he can't walk with his wife without his back pain and asthma. Reports that walking and standing makes his pain in his back worse. Pain is described as sharp at times but not sure if that goes into legs. States pain usually on the right.    States he goes to the senior center and takes tai chi and has no pain with this.      Hand dominance: Right   PERTINENT HISTORY:  Asthma, HTN, B TKR, B shoulder surgery (total shoulder per pt <5 years ago), diaphragmatic hernia repaired    PAIN:  Are you having pain? Yes neck is 2/10 in neck stiffness   PRECAUTIONS: None   WEIGHT BEARING RESTRICTIONS: No   FALLS:  Has patient fallen in last 6 months? No     OCCUPATION: not working   PLOF: Independent   PATIENT GOALS: to have less pain   OBJECTIVE:    DIAGNOSTIC FINDINGS:  None at this time   COGNITION: Overall cognitive status: Within functional limits for tasks assessed   SENSATION: Not tested   POSTURE: rounded shoulders, forward head, and flexed trunk    PALPATION: Tenderness to palpation along right lumbar paraspinals and glutes      CERVICAL ROM:    Active ROM  A/PROM (deg) eval  Flexion WFL  Extension WFL  Right lateral flexion 15*  Left lateral flexion 10*  Right rotation 45*  Left rotation 40*   (Blank rows = not tested) * Pulling sensation on the right              Lumber APROM:    01/12/2023      Flexion  50% limited     Extension  90% limited     R ROT       L ROT       R SB  75% limited    L SB 75% limited                          * Pain              (Blank rows = not tested)       LE Measurements       Lower Extremity Right 01/12/2023 Left 01/12/2023    A/PROM MMT A/PROM MMT  Hip Flexion WFL 4 WFL 4-  Hip Extension          Hip Abduction          Hip Adduction          Hip Internal rotation 30   5    Hip External rotation 45   20    Knee Flexion   4   4  Knee Extension   4-   4-  Ankle Dorsiflexion          Ankle Plantarflexion          Ankle Inversion          Ankle Eversion           (  Blank rows = not tested) * pain               UE Measurements       Upper Extremity Right 01/12/2023 Left 01/12/2023    A/PROM MMT A/PROM MMT  Shoulder Flexion 135* 4* 130 4  Shoulder Extension          Shoulder Abduction 120 (stiff) 4- 140 4  Shoulder Adduction          Shoulder Internal Rotation   4   4  Shoulder External Rotation Reaches to C7 SP (tight) 3+ Reaches to T2 SP 4-  Elbow Flexion Reaches to T12 SP (tight)    Reaches to T8 SP     Elbow Extension          Wrist Flexion          Wrist Extension          Wrist Supination          Wrist Pronation          Wrist Ulnar Deviation          Wrist Radial Deviation          Grip Strength NA   NA                          (Blank rows = not tested)                       * pain     SPECIAL TESTS:  Slump test neg B Straight leg test neg B  Ely's + B   FUNCTIONAL TESTS:  Holds breath with transitional movements- abdominal hernia present   TODAY'S TREATMENT:                                                                                                                               DATE:   02/02/2023 Therapeutic Exercise:    Aerobic: Supine:  Prone:    Seated: cervical ROT 2 minutes, shoulder rolls 2 minutes, shoulder flexion with cane 2 minutes, shoulder ER/IR 2 minutes, STS 3x10 elevated chair no UE support     Standing:  Neuromuscular Re-education: Manual Therapy: Gait training: Self Care:   Trigger Point Dry Needling:  Modalities: thermo therapy during seated exercises.      PATIENT EDUCATION:  Education details: on HEP, on benefits of heat, mobility work and HEP, review of the amount of time seated during the day. Importance of mobility throughout the day Person educated: Patient Education method: Explanation, Demonstration, and Handouts Education comprehension: verbalized understanding     HOME EXERCISE PROGRAM: [email or web address]   ASSESSMENT:   CLINICAL IMPRESSION: 02/02/2023 Today focused on seated neck mobility routine with use of thermotherapy to help with continued tightness/stiffness. Responded well to heat but required cues with all exercises for form and to not hold positions for too long. Overall patient is doing alright  but does continue to sit for prolonged periods and complains of the most stiffness after sitting for long periods of time. Printed off new exercises for HEP adherence.  Eval: Patient presents to physical therapy with complaints of neck and low back pain that has been present for a couple of years.  Patient limited in range of motion and strength that is contributing to current level of function.  Educated patient in benefits of physical therapy as well as basic home exercise program.  Answered all questions send patient would greatly benefit from skilled physical therapy at this time to improve overall function and quality of life.   OBJECTIVE IMPAIRMENTS: Abnormal gait, decreased activity tolerance, decreased knowledge of use of DME, difficulty walking, decreased ROM, decreased strength, improper body mechanics, postural  dysfunction, and pain.    ACTIVITY LIMITATIONS: lifting, bending, standing, sleeping, transfers, and locomotion level   PARTICIPATION LIMITATIONS: meal prep, cleaning, and community activity   PERSONAL FACTORS: Fitness and 1-2 comorbidities: abdominal hernia, B TKA  are also affecting patient's functional outcome.    REHAB POTENTIAL: Good   CLINICAL DECISION MAKING: Stable/uncomplicated   EVALUATION COMPLEXITY: Low     GOALS: Goals reviewed with patient? yes   SHORT TERM GOALS: Target date: 02/23/2023  Patient will be independent in self management strategies to improve quality of life and functional outcomes. Baseline: New Program Goal status: INITIAL   2.  Patient will report at least 50% improvement in overall symptoms and/or function to demonstrate improved functional mobility Baseline: 0% better Goal status: INITIAL   3.  Patient will be able to demonstrate pain-free neck range of motion in all directions Baseline: Painful Goal status: INITIAL           LONG TERM GOALS: Target date: 04/06/2023    Patient will report at least 75% improvement in overall symptoms and/or function to demonstrate improved functional mobility Baseline: 0% better Goal status: INITIAL   2.  Patient will demonstrate improved lumbar range of motion by least 25% in all directions Baseline: see above Goal status: INITIAL   3.  Patient will report performing daily mobility routine to reduce stiffness in neck and back Baseline: Not currently Goal status: INITIAL       PLAN:   PT FREQUENCY: 1-2x/week for total of 12 visits over 12 week certification period   PT DURATION: 12 weeks   PLANNED INTERVENTIONS: Therapeutic exercises, Therapeutic activity, Neuromuscular re-education, Balance training, Gait training, Patient/Family education, Self Care, Joint mobilization, Joint manipulation, Vestibular training, Canalith repositioning, Orthotic/Fit training, Aquatic Therapy, Dry Needling,  Electrical stimulation, Spinal manipulation, Spinal mobilization, Cryotherapy, Moist heat, Traction, Ultrasound, and Re-evaluation   PLAN FOR NEXT SESSION: lumbar/hip/cervical ROM, LE MMT, prone not tolerated     3:32 PM, 02/02/23 Tereasa Coop, DPT Physical Therapy with [facility]

## 2023-02-04 ENCOUNTER — Encounter: Payer: Medicare Other | Admitting: Physical Therapy

## 2023-02-04 ENCOUNTER — Other Ambulatory Visit: Payer: Self-pay | Admitting: Family Medicine

## 2023-02-04 ENCOUNTER — Encounter: Payer: Self-pay | Admitting: Physical Therapy

## 2023-02-04 ENCOUNTER — Ambulatory Visit (INDEPENDENT_AMBULATORY_CARE_PROVIDER_SITE_OTHER): Payer: Medicare Other | Admitting: Physical Therapy

## 2023-02-04 DIAGNOSIS — M6281 Muscle weakness (generalized): Secondary | ICD-10-CM

## 2023-02-04 DIAGNOSIS — M542 Cervicalgia: Secondary | ICD-10-CM

## 2023-02-04 NOTE — Therapy (Signed)
OUTPATIENT PHYSICAL THERAPY TREATMENT NOTE   Patient Name: Austin [name] MRN: [id number] DOB:October 28, 1940, 82 y.o., male Today's Date: 02/04/2023  PCP: Ardith Dark, MD   REFERRING PROVIDER: Ardith Dark, MD  END OF SESSION:   PT End of Session - 02/04/23 1104     Visit Number 6    Number of Visits 12    Date for PT Re-Evaluation 04/06/23    Authorization Type medicare    PT Start Time 1105    PT Stop Time 1145    PT Time Calculation (min) 40 min    Activity Tolerance Patient tolerated treatment well             Past Medical History:  Diagnosis Date   Asthma    Depression    Hypertension    Prostate cancer (HCC)    Urinary incontinence    Past Surgical History:  Procedure Laterality Date   GALLBLADDER SURGERY  1997   REPLACEMENT TOTAL KNEE BILATERAL  2001   2007   surgery replacement Bilateral 2005   Patient Active Problem List   Diagnosis Date Noted   Viral URI 12/10/2022   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   Seasonal allergic rhinitis 08/31/2021   Vitamin B12 deficiency 02/10/2021   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   Chronic low back pain 02/11/2020   Insomnia 02/11/2020   Hyperglycemia 11/08/2019   Vitamin D deficiency 11/08/2019   Essential hypertension 11/08/2019   Gastroesophageal reflux disease 11/08/2019   Depression, major, single episode, 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   Cervical disc disease 11/08/2019   Asthma, persistent controlled 11/08/2019   Prostate cancer (HCC) 11/08/2019   Status post cataract extraction 11/08/2019   Dyslipidemia 11/08/2019    THERAPY DIAG:  Cervicalgia  Muscle weakness (generalized)  REFERRING DIAG: M50.90 (ICD-10-CM) - Cervical disc disease     Rationale for Evaluation and Treatment: Rehabilitation   ONSET DATE: a couple years   SUBJECTIVE:                                                                                                                                                                                                           SUBJECTIVE STATEMENT: 02/04/2023 States he feels stiff in am.  He is trying to walk, reports getting winded. States tightness in R side of neck .  Eval:  Right sided neck pain reported and in right low back. States that the neck pain has been going on for 1-2 years with no change over the last 2 years. States he talked to MD  about pain who suggested PT.  States neck pain is like a clunk and when he does his neck exercises he can get like a clunking feeling when he moves it wrong. States certain movements make his neck pain worse.    Low back pain has been going on for a long time and he can't walk with his wife without his back pain and asthma. Reports that walking and standing makes his pain in his back worse. Pain is described as sharp at times but not sure if that goes into legs. States pain usually on the right.    States [facility] the [facility] center and takes tai chi and has no pain with this.      Hand dominance: Right   PERTINENT HISTORY:  Asthma, HTN, B TKR, B shoulder surgery (total shoulder per pt <5 years ago), diaphragmatic hernia repaired    PAIN:  Are you having pain? Yes neck is 2/10 in neck stiffness   PRECAUTIONS: None   WEIGHT BEARING RESTRICTIONS: No   FALLS:  Has patient fallen in last 6 months? No     OCCUPATION: not working   PLOF: Independent   PATIENT GOALS: to have less pain   OBJECTIVE:    DIAGNOSTIC FINDINGS:  None at this time   COGNITION: Overall cognitive status: Within functional limits for tasks assessed   SENSATION: Not tested   POSTURE: rounded shoulders, forward head, and flexed trunk    PALPATION: Tenderness to palpation along right lumbar paraspinals and glutes      CERVICAL ROM:    Active ROM A/PROM (deg) eval  Flexion WFL  Extension WFL  Right lateral flexion 15*  Left lateral flexion 10*  Right rotation 45*  Left rotation 40*   (Blank rows = not  tested) * Pulling sensation on the right              Lumber APROM:    01/12/2023      Flexion  50% limited     Extension  90% limited     R ROT       L ROT       R SB  75% limited    L SB 75% limited                          * Pain              (Blank rows = not tested)       LE Measurements       Lower Extremity Right 01/12/2023 Left 01/12/2023    A/PROM MMT A/PROM MMT  Hip Flexion WFL 4 WFL 4-  Hip Extension          Hip Abduction          Hip Adduction          Hip Internal rotation 30   5    Hip External rotation 45   20    Knee Flexion   4   4  Knee Extension   4-   4-  Ankle Dorsiflexion          Ankle Plantarflexion          Ankle Inversion          Ankle Eversion           (Blank rows = not tested) * pain               UE Measurements  Upper Extremity Right 01/12/2023 Left 01/12/2023    A/PROM MMT A/PROM MMT  Shoulder Flexion 135* 4* 130 4  Shoulder Extension          Shoulder Abduction 120 (stiff) 4- 140 4  Shoulder Adduction          Shoulder Internal Rotation   4   4  Shoulder External Rotation Reaches to C7 SP (tight) 3+ Reaches to T2 SP 4-  Elbow Flexion Reaches to T12 SP (tight)    Reaches to T8 SP     Elbow Extension          Wrist Flexion          Wrist Extension          Wrist Supination          Wrist Pronation          Wrist Ulnar Deviation          Wrist Radial Deviation          Grip Strength NA   NA                          (Blank rows = not tested)                       * pain     SPECIAL TESTS:  Slump test neg B Straight leg test neg B  Ely's + B   FUNCTIONAL TESTS:  Holds breath with transitional movements- abdominal hernia present   TODAY'S TREATMENT:                                                                                                                              DATE:   02/04/2023 Therapeutic Exercise:    Aerobic: Supine:  Prone:    Seated: cervical ROT 2 minutes, shoulder rolls 2 minutes,  STS 3x10  elevated chair no UE support     Standing:  at wall: education on upright posture, scap squeeze and knee ext cues 5 sec x 10;  Neuromuscular Re-education: Manual Therapy: STM/TPR to R UT and levator ; PROM for neck rotation ;  Gait training: Self Care:   Trigger Point Dry Needling:  Modalities: thermo therapy at end of session, to neck/ seated. X 10 min Trigger Point Dry-Needling  Treatment instructions: Expect mild to moderate muscle soreness. S/S of pneumothorax if dry needled over a lung field, and to seek immediate medical attention should they occur. Patient verbalized understanding of these instructions and education.  Patient Consent Given: Yes Education handout provided: Yes Muscles treated: R UT/ seated  Electrical stimulation performed: No Parameters: N/A Treatment response/outcome: Twitch response, palpable increase in muscle length    Previous:  Therapeutic Exercise:    Aerobic: Supine:  Prone:    Seated: cervical ROT 2 minutes, shoulder rolls 2 minutes, shoulder flexion with cane 2 minutes, shoulder ER/IR 2 minutes,  STS  3x10 elevated chair no UE support     Standing:  Neuromuscular Re-education: Manual Therapy: Gait training: Self Care:   Trigger Point Dry Needling:  Modalities: thermo therapy during seated exercises.    PATIENT EDUCATION:  Education details: on HEP, on benefits of heat, mobility work and HEP, review of the amount of time seated during the day. Importance of mobility throughout the day Person educated: Patient Education method: Explanation, Demonstration, and Handouts Education comprehension: verbalized understanding     HOME EXERCISE PROGRAM: [id number]   ASSESSMENT:   CLINICAL IMPRESSION: 02/04/2023 Continued focus on neck mobility. Pt with stiffness in c-spine, but also with muscle tension in R UT, Manual tissue release and dry needling done to improve. Pt with good tolerance. Discussed trying ROM exercises in the AM for neck and back as  this is when he feels most stiff, and also doing throughout the day as needed. He will benefit from continued care.   Eval: Patient presents to physical therapy with complaints of neck and low back pain that has been present for a couple of years.  Patient limited in range of motion and strength that is contributing to current level of function.  Educated patient in benefits of physical therapy as well as basic home exercise program.  Answered all questions send patient would greatly benefit from skilled physical therapy at this time to improve overall function and quality of life.   OBJECTIVE IMPAIRMENTS: Abnormal gait, decreased activity tolerance, decreased knowledge of use of DME, difficulty walking, decreased ROM, decreased strength, improper body mechanics, postural dysfunction, and pain.    ACTIVITY LIMITATIONS: lifting, bending, standing, sleeping, transfers, and locomotion level   PARTICIPATION LIMITATIONS: meal prep, cleaning, and community activity   PERSONAL FACTORS: Fitness and 1-2 comorbidities: abdominal hernia, B TKA  are also affecting patient's functional outcome.    REHAB POTENTIAL: Good   CLINICAL DECISION MAKING: Stable/uncomplicated   EVALUATION COMPLEXITY: Low     GOALS: Goals reviewed with patient? yes   SHORT TERM GOALS: Target date: 02/23/2023  Patient will be independent in self management strategies to improve quality of life and functional outcomes. Baseline: New Program Goal status: INITIAL   2.  Patient will report at least 50% improvement in overall symptoms and/or function to demonstrate improved functional mobility Baseline: 0% better Goal status: INITIAL   3.  Patient will be able to demonstrate pain-free neck range of motion in all directions Baseline: Painful Goal status: INITIAL           LONG TERM GOALS: Target date: 04/06/2023    Patient will report at least 75% improvement in overall symptoms and/or function to demonstrate improved  functional mobility Baseline: 0% better Goal status: INITIAL   2.  Patient will demonstrate improved lumbar range of motion by least 25% in all directions Baseline: see above Goal status: INITIAL   3.  Patient will report performing daily mobility routine to reduce stiffness in neck and back Baseline: Not currently Goal status: INITIAL       PLAN:   PT FREQUENCY: 1-2x/week for total of 12 visits over 12 week certification period   PT DURATION: 12 weeks   PLANNED INTERVENTIONS: Therapeutic exercises, Therapeutic activity, Neuromuscular re-education, Balance training, Gait training, Patient/Family education, Self Care, Joint mobilization, Joint manipulation, Vestibular training, Canalith repositioning, Orthotic/Fit training, Aquatic Therapy, Dry Needling, Electrical stimulation, Spinal manipulation, Spinal mobilization, Cryotherapy, Moist heat, Traction, Ultrasound, and Re-evaluation   PLAN FOR NEXT SESSION: lumbar/hip/cervical ROM, LE MMT, prone not tolerated  Sedalia Muta, PT, DPT 12:02 PM  02/04/23

## 2023-02-09 ENCOUNTER — Ambulatory Visit (INDEPENDENT_AMBULATORY_CARE_PROVIDER_SITE_OTHER): Payer: Medicare Other | Admitting: Physical Therapy

## 2023-02-09 ENCOUNTER — Encounter: Payer: Self-pay | Admitting: Physical Therapy

## 2023-02-09 DIAGNOSIS — M6281 Muscle weakness (generalized): Secondary | ICD-10-CM | POA: Diagnosis not present

## 2023-02-09 DIAGNOSIS — M542 Cervicalgia: Secondary | ICD-10-CM

## 2023-02-09 NOTE — Therapy (Signed)
OUTPATIENT PHYSICAL THERAPY TREATMENT NOTE   Patient Name: Austin [name] MRN: [id number] DOB:[birth year], 82 y.o., male Today's Date: 02/09/2023  PCP: Ardith Dark, MD   REFERRING PROVIDER: Ardith Dark, MD  END OF SESSION:   PT End of Session - 02/09/23 1434     Visit Number 7    Number of Visits 12    Date for PT Re-Evaluation 04/06/23    Authorization Type medicare    PT Start Time 1435    PT Stop Time 1515    PT Time Calculation (min) 40 min    Activity Tolerance Patient tolerated treatment well    Behavior During Therapy [facility] for tasks assessed/performed             Past Medical History:  Diagnosis Date   Asthma    Depression    Hypertension    Prostate cancer (HCC)    Urinary incontinence    Past Surgical History:  Procedure Laterality Date   GALLBLADDER SURGERY  1997   REPLACEMENT TOTAL KNEE BILATERAL  2001   2007   surgery replacement Bilateral 2005   Patient Active Problem List   Diagnosis Date Noted   Viral URI 12/10/2022   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   Seasonal allergic rhinitis 08/31/2021   Vitamin B12 deficiency 02/10/2021   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   Chronic low back pain 02/11/2020   Insomnia 02/11/2020   Hyperglycemia 11/08/2019   Vitamin D deficiency 11/08/2019   Essential hypertension 11/08/2019   Gastroesophageal reflux disease 11/08/2019   Depression, major, single episode, 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   Cervical disc disease 11/08/2019   Asthma, persistent controlled 11/08/2019   Prostate cancer (HCC) 11/08/2019   Status post cataract extraction 11/08/2019   Dyslipidemia 11/08/2019    THERAPY DIAG:  Cervicalgia  Muscle weakness (generalized)  REFERRING DIAG: M50.90 (ICD-10-CM) - Cervical disc disease     Rationale for Evaluation and Treatment: Rehabilitation   ONSET DATE: a couple years   SUBJECTIVE:                                                                                                                                                                                                           SUBJECTIVE STATEMENT: 02/09/2023 States his right leg is bothering him. States he woke up with pain in his right leg and it has been really stiff. States he has been trying his exercises and they are not helping. States that he did  lot of work around the house which may be contributing.  Eval:  Right sided neck pain reported and in right low back. States that the neck pain has been going on for 1-2 years with no change over the last 2 years. States he talked to MD about pain who suggested PT.  States neck pain is like a clunk and when he does his neck exercises he can get like a clunking feeling when he moves it wrong. States certain movements make his neck pain worse.    Low back pain has been going on for a long time and he can't walk with his wife without his back pain and asthma. Reports that walking and standing makes his pain in his back worse. Pain is described as sharp at times but not sure if that goes into legs. States pain usually on the right.    States he goes to the senior center and takes tai chi and has no pain with this.      Hand dominance: Right   PERTINENT HISTORY:  Asthma, HTN, B TKR, B shoulder surgery (total shoulder per pt <5 years ago), diaphragmatic hernia repaired    PAIN:  Are you having pain? Yes neck is 2/10 in neck stiffness   PRECAUTIONS: None   WEIGHT BEARING RESTRICTIONS: No   FALLS:  Has patient fallen in last 6 months? No     OCCUPATION: not working   PLOF: Independent   PATIENT GOALS: to have less pain   OBJECTIVE:    DIAGNOSTIC FINDINGS:  None at this time   COGNITION: Overall cognitive status: Within functional limits for tasks assessed   SENSATION: Not tested   POSTURE: rounded shoulders, forward head, and flexed trunk    PALPATION: Tenderness to palpation along right lumbar paraspinals and glutes       CERVICAL ROM:    Active ROM A/PROM (deg) eval  Flexion WFL  Extension WFL  Right lateral flexion 15*  Left lateral flexion 10*  Right rotation 45*  Left rotation 40*   (Blank rows = not tested) * Pulling sensation on the right              Lumber APROM:    01/12/2023      Flexion  50% limited     Extension  90% limited     R ROT       L ROT       R SB  75% limited    L SB 75% limited                          * Pain              (Blank rows = not tested)       LE Measurements       Lower Extremity Right 01/12/2023 Left 01/12/2023    A/PROM MMT A/PROM MMT  Hip Flexion WFL 4 WFL 4-  Hip Extension          Hip Abduction          Hip Adduction          Hip Internal rotation 30   5    Hip External rotation 45   20    Knee Flexion   4   4  Knee Extension   4-   4-  Ankle Dorsiflexion          Ankle Plantarflexion          Ankle Inversion  Ankle Eversion           (Blank rows = not tested) * pain               UE Measurements       Upper Extremity Right 01/12/2023 Left 01/12/2023    A/PROM MMT A/PROM MMT  Shoulder Flexion 135* 4* 130 4  Shoulder Extension          Shoulder Abduction 120 (stiff) 4- 140 4  Shoulder Adduction          Shoulder Internal Rotation   4   4  Shoulder External Rotation Reaches to C7 SP (tight) 3+ Reaches to T2 SP 4-  Elbow Flexion Reaches to T12 SP (tight)    Reaches to T8 SP     Elbow Extension          Wrist Flexion          Wrist Extension          Wrist Supination          Wrist Pronation          Wrist Ulnar Deviation          Wrist Radial Deviation          Grip Strength NA   NA                          (Blank rows = not tested)                       * pain     SPECIAL TESTS:  Slump test neg B Straight leg test neg B  Ely's + B   FUNCTIONAL TESTS:  Holds breath with transitional movements- abdominal hernia present   TODAY'S TREATMENT:                                                                                                                               DATE:   02/09/2023 Therapeutic Exercise:    Aerobic: Supine:  Prone:    Seated: cervical ROT 2 minutes, shoulder rolls 2 minutes,  STS 3x10 elevated chair no UE support     Standing:  at wall: education on upright posture, scap squeeze and knee ext cues 5 sec x 10;  Neuromuscular Re-education: Manual Therapy: STM/TPR to R UT and levator ; PROM for neck rotation ;  Gait training: Self Care:   Trigger Point Dry Needling:  Modalities: thermo therapy at end of session, to neck/ seated. X 10 min Trigger Point Dry-Needling  Treatment instructions: Expect mild to moderate muscle soreness. S/S of pneumothorax if dry needled over a lung field, and to seek immediate medical attention should they occur. Patient verbalized understanding of these instructions and education.  Patient Consent Given: Yes Education handout provided: Yes Muscles treated: R UT/ seated  Electrical stimulation performed: No Parameters: N/A Treatment response/outcome: Twitch response, palpable increase  in muscle length    Previous:  Therapeutic Exercise:    Aerobic: Supine: bent knee fall outs 5" holds 2 minutes, piriformis stretch x2 30" holds IR and ER each B, hip extension stretch 2 minutes total 10" holds B, windshield wipers 2 minutes B Prone:    Seated: cervical ROT 2 minutes, shoulder rolls 2 minutes, shoulder flexion with cane 2 minutes, shoulder ER/IR 2 minutes,  STS 3x10 elevated chair no UE support     Standing: glute squeeze 5" holds at counter with tall posture 2  minutes total, self mobilization to piriformis with tennis ball 4 minutes  Neuromuscular Re-education: Manual Therapy: Gait training: with and without cane - educated on cane use and standing tall 10 minutes Self Care:   Trigger Point Dry Needling:  Modalities: thermo therapy during seated exercises.    PATIENT EDUCATION:  Education details: on HEP, on possibly using walking stick/cane more  frequently to offload LE if pain continues to increase Person educated: Patient Education method: Programmer, multimedia, Demonstration, and Handouts Education comprehension: verbalized understanding     HOME EXERCISE PROGRAM: [id number]   ASSESSMENT:   CLINICAL IMPRESSION: 02/09/2023 Session focused don low back exercises secondary to right LE symptoms limiting patient's ability to walk and participate in cervical rehab. Reviewed basic hip and low back exercises as patient was performing many of these incorrectly at home. Reduced pain after stretches noted with return to standing position, but continued to feel some discomfort in right glute. Added new exercises to HEP.  Eval: Patient presents to physical therapy with complaints of neck and low back pain that has been present for a couple of years.  Patient limited in range of motion and strength that is contributing to current level of function.  Educated patient in benefits of physical therapy as well as basic home exercise program.  Answered all questions send patient would greatly benefit from skilled physical therapy at this time to improve overall function and quality of life.   OBJECTIVE IMPAIRMENTS: Abnormal gait, decreased activity tolerance, decreased knowledge of use of DME, difficulty walking, decreased ROM, decreased strength, improper body mechanics, postural dysfunction, and pain.    ACTIVITY LIMITATIONS: lifting, bending, standing, sleeping, transfers, and locomotion level   PARTICIPATION LIMITATIONS: meal prep, cleaning, and community activity   PERSONAL FACTORS: Fitness and 1-2 comorbidities: abdominal hernia, B TKA  are also affecting patient's functional outcome.    REHAB POTENTIAL: Good   CLINICAL DECISION MAKING: Stable/uncomplicated   EVALUATION COMPLEXITY: Low     GOALS: Goals reviewed with patient? yes   SHORT TERM GOALS: Target date: 02/23/2023  Patient will be independent in self management strategies to improve  quality of life and functional outcomes. Baseline: New Program Goal status: INITIAL   2.  Patient will report at least 50% improvement in overall symptoms and/or function to demonstrate improved functional mobility Baseline: 0% better Goal status: INITIAL   3.  Patient will be able to demonstrate pain-free neck range of motion in all directions Baseline: Painful Goal status: INITIAL           LONG TERM GOALS: Target date: 04/06/2023    Patient will report at least 75% improvement in overall symptoms and/or function to demonstrate improved functional mobility Baseline: 0% better Goal status: INITIAL   2.  Patient will demonstrate improved lumbar range of motion by least 25% in all directions Baseline: see above Goal status: INITIAL   3.  Patient will report performing daily mobility routine to reduce stiffness in neck and  back Baseline: Not currently Goal status: INITIAL       PLAN:   PT FREQUENCY: 1-2x/week for total of 12 visits over 12 week certification period   PT DURATION: 12 weeks   PLANNED INTERVENTIONS: Therapeutic exercises, Therapeutic activity, Neuromuscular re-education, Balance training, Gait training, Patient/Family education, Self Care, Joint mobilization, Joint manipulation, Vestibular training, Canalith repositioning, Orthotic/Fit training, Aquatic Therapy, Dry Needling, Electrical stimulation, Spinal manipulation, Spinal mobilization, Cryotherapy, Moist heat, Traction, Ultrasound, and Re-evaluation   PLAN FOR NEXT SESSION: lumbar/hip/cervical ROM, LE MMT, prone not tolerated   3:16 PM, 02/09/23 Tereasa Coop, DPT Physical Therapy with [facility]

## 2023-02-11 ENCOUNTER — Ambulatory Visit (INDEPENDENT_AMBULATORY_CARE_PROVIDER_SITE_OTHER): Payer: Medicare Other | Admitting: Physical Therapy

## 2023-02-11 ENCOUNTER — Encounter: Payer: Self-pay | Admitting: Physical Therapy

## 2023-02-11 DIAGNOSIS — M542 Cervicalgia: Secondary | ICD-10-CM

## 2023-02-11 DIAGNOSIS — M6281 Muscle weakness (generalized): Secondary | ICD-10-CM

## 2023-02-11 NOTE — Therapy (Signed)
OUTPATIENT PHYSICAL THERAPY TREATMENT NOTE   Patient Name: Austin [name] MRN: [id number] DOB:1940/12/09, 82 y.o., male Today's Date: 02/11/2023  PCP: Ardith Dark, MD   REFERRING PROVIDER: Ardith Dark, MD  END OF SESSION:   PT End of Session - 02/11/23 1101     Visit Number 8    Number of Visits 12    Date for PT Re-Evaluation 04/06/23    Authorization Type medicare    PT Start Time 1102    PT Stop Time 1147    PT Time Calculation (min) 45 min    Activity Tolerance Patient tolerated treatment well    Behavior During Therapy [facility] for tasks assessed/performed             Past Medical History:  Diagnosis Date   Asthma    Depression    Hypertension    Prostate cancer (HCC)    Urinary incontinence    Past Surgical History:  Procedure Laterality Date   GALLBLADDER SURGERY  1997   REPLACEMENT TOTAL KNEE BILATERAL  2001   2007   surgery replacement Bilateral 2005   Patient Active Problem List   Diagnosis Date Noted   Viral URI 12/10/2022   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   Seasonal allergic rhinitis 08/31/2021   Vitamin B12 deficiency 02/10/2021   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   Chronic low back pain 02/11/2020   Insomnia 02/11/2020   Hyperglycemia 11/08/2019   Vitamin D deficiency 11/08/2019   Essential hypertension 11/08/2019   Gastroesophageal reflux disease 11/08/2019   Depression, major, single episode, 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   Cervical disc disease 11/08/2019   Asthma, persistent controlled 11/08/2019   Prostate cancer (HCC) 11/08/2019   Status post cataract extraction 11/08/2019   Dyslipidemia 11/08/2019    THERAPY DIAG:  Cervicalgia  Muscle weakness (generalized)  REFERRING DIAG: M50.90 (ICD-10-CM) - Cervical disc disease     Rationale for Evaluation and Treatment: Rehabilitation   ONSET DATE: a couple years   SUBJECTIVE:                                                                                                                                                                                                           SUBJECTIVE STATEMENT: 02/11/2023 States his right leg is still bothering him. He brought in 2 canes today to try, has not started using it at home yet. Leg is most bothersome but neck still tight as well.   Eval:  Right sided neck pain reported and in right low back. States that the  neck pain has been going on for 1-2 years with no change over the last 2 years. States he talked to MD about pain who suggested PT.  States neck pain is like a clunk and when he does his neck exercises he can get like a clunking feeling when he moves it wrong. States certain movements make his neck pain worse.    Low back pain has been going on for a long time and he can't walk with his wife without his back pain and asthma. Reports that walking and standing makes his pain in his back worse. Pain is described as sharp at times but not sure if that goes into legs. States pain usually on the right.    States [facility] the senior center and takes tai chi and has no pain with this.      Hand dominance: Right   PERTINENT HISTORY:  Asthma, HTN, B TKR, B shoulder surgery (total shoulder per pt <5 years ago), diaphragmatic hernia repaired    PAIN:  Are you having pain? Yes neck is 2/10 in neck stiffness   PRECAUTIONS: None   WEIGHT BEARING RESTRICTIONS: No   FALLS:  Has patient fallen in last 6 months? No     OCCUPATION: not working   PLOF: Independent   PATIENT GOALS: to have less pain   OBJECTIVE:    DIAGNOSTIC FINDINGS:  None at this time   COGNITION: Overall cognitive status: Within functional limits for tasks assessed   SENSATION: Not tested   POSTURE: rounded shoulders, forward head, and flexed trunk    PALPATION: Tenderness to palpation along right lumbar paraspinals and glutes      CERVICAL ROM:    Active ROM A/PROM (deg) eval  Flexion WFL  Extension WFL   Right lateral flexion 15*  Left lateral flexion 10*  Right rotation 45*  Left rotation 40*   (Blank rows = not tested) * Pulling sensation on the right              Lumber APROM:    01/12/2023      Flexion  50% limited     Extension  90% limited     R ROT       L ROT       R SB  75% limited    L SB 75% limited                          * Pain              (Blank rows = not tested)       LE Measurements       Lower Extremity Right 01/12/2023 Left 01/12/2023    A/PROM MMT A/PROM MMT  Hip Flexion WFL 4 WFL 4-  Hip Extension          Hip Abduction          Hip Adduction          Hip Internal rotation 30   5    Hip External rotation 45   20    Knee Flexion   4   4  Knee Extension   4-   4-  Ankle Dorsiflexion          Ankle Plantarflexion          Ankle Inversion          Ankle Eversion           (Blank  rows = not tested) * pain               UE Measurements       Upper Extremity Right 01/12/2023 Left 01/12/2023    A/PROM MMT A/PROM MMT  Shoulder Flexion 135* 4* 130 4  Shoulder Extension          Shoulder Abduction 120 (stiff) 4- 140 4  Shoulder Adduction          Shoulder Internal Rotation   4   4  Shoulder External Rotation Reaches to C7 SP (tight) 3+ Reaches to T2 SP 4-  Elbow Flexion Reaches to T12 SP (tight)    Reaches to T8 SP     Elbow Extension          Wrist Flexion          Wrist Extension          Wrist Supination          Wrist Pronation          Wrist Ulnar Deviation          Wrist Radial Deviation          Grip Strength NA   NA                          (Blank rows = not tested)                       * pain     SPECIAL TESTS:  Slump test neg B Straight leg test neg B  Ely's + B   FUNCTIONAL TESTS:  Holds breath with transitional movements- abdominal hernia present   TODAY'S TREATMENT:                                                                                                                              DATE:    02/11/23 Therapeutic  Exercise:    Aerobic: Supine: piriformis stretch x3 on R;    LTR x 15;  Pelvic tilts x 15; bent knee fallouts 5 sec x 15;  Lumbar/hip flexion, Legs on Pball x 10/slow ,   hip flexor stretch- R hip off side of table, therapist assisted 20 sec x 3;  Prone:    Seated:  cervical ROT 2 minutes,     Standing:  practice for upright posture, glute and quad squeeze 5 sec x 10;  Neuromuscular Re-education: Manual Therapy:    Gait training:  Education and practice for mechanics and use of  SPC, adjusted to proper height;  45 ft x 8;  Trigger Point Dry-Needling  Treatment instructions: Expect mild to moderate muscle soreness. S/S of pneumothorax if dry needled over a lung field, and to seek immediate medical attention should they occur. Patient verbalized understanding of these instructions and education.  Patient Consent Given: Yes Education handout provided: Previously provided Muscles treated: R UT /seated Lobbyist  stimulation performed: No Parameters: N/A Treatment response/outcome: palpable increase in muscle length, twitch response;     Previous:  Therapeutic Exercise:    Aerobic: Supine: bent knee fall outs 5" holds 2 minutes, piriformis stretch x2 30" holds IR and ER each B, hip extension stretch 2 minutes total 10" holds B, windshield wipers 2 minutes B Prone:    Seated: cervical ROT 2 minutes, shoulder rolls 2 minutes, shoulder flexion with cane 2 minutes, shoulder ER/IR 2 minutes,  STS 3x10 elevated chair no UE support     Standing: glute squeeze 5" holds at counter with tall posture 2  minutes total, self mobilization to piriformis with tennis ball 4 minutes  Neuromuscular Re-education: Manual Therapy: Gait training: with and without cane - educated on cane use and standing tall 10 minutes Self Care:   Trigger Point Dry Needling:  Modalities: thermo therapy during seated exercises.    PATIENT EDUCATION:  Education details: on HEP, on possibly using walking stick/cane more  frequently to offload LE if pain continues to increase Person educated: Patient Education method: Programmer, multimedia, Demonstration, and Handouts Education comprehension: verbalized understanding     HOME EXERCISE PROGRAM: [id number]   ASSESSMENT:   CLINICAL IMPRESSION: 02/11/2023 Pt with continued pain in R glute. He has moderate relief of pain at end of session today and improved comfort when walking. Discussed continuing mobility for back and hip at home for pain relief. Reviewed optimal use of SPC and recommended continued use of this for pain with walking. Pt with good ability for use and sequencing after education and practice.   Eval: Patient presents to physical therapy with complaints of neck and low back pain that has been present for a couple of years.  Patient limited in range of motion and strength that is contributing to current level of function.  Educated patient in benefits of physical therapy as well as basic home exercise program.  Answered all questions send patient would greatly benefit from skilled physical therapy at this time to improve overall function and quality of life.   OBJECTIVE IMPAIRMENTS: Abnormal gait, decreased activity tolerance, decreased knowledge of use of DME, difficulty walking, decreased ROM, decreased strength, improper body mechanics, postural dysfunction, and pain.    ACTIVITY LIMITATIONS: lifting, bending, standing, sleeping, transfers, and locomotion level   PARTICIPATION LIMITATIONS: meal prep, cleaning, and community activity   PERSONAL FACTORS: Fitness and 1-2 comorbidities: abdominal hernia, B TKA  are also affecting patient's functional outcome.    REHAB POTENTIAL: Good   CLINICAL DECISION MAKING: Stable/uncomplicated   EVALUATION COMPLEXITY: Low     GOALS: Goals reviewed with patient? yes   SHORT TERM GOALS: Target date: 02/23/2023  Patient will be independent in self management strategies to improve quality of life and functional  outcomes. Baseline: New Program Goal status: INITIAL   2.  Patient will report at least 50% improvement in overall symptoms and/or function to demonstrate improved functional mobility Baseline: 0% better Goal status: INITIAL   3.  Patient will be able to demonstrate pain-free neck range of motion in all directions Baseline: Painful Goal status: INITIAL           LONG TERM GOALS: Target date: 04/06/2023    Patient will report at least 75% improvement in overall symptoms and/or function to demonstrate improved functional mobility Baseline: 0% better Goal status: INITIAL   2.  Patient will demonstrate improved lumbar range of motion by least 25% in all directions Baseline: see above Goal status: INITIAL   3.  Patient will report performing daily mobility routine to reduce stiffness in neck and back Baseline: Not currently Goal status: INITIAL       PLAN:   PT FREQUENCY: 1-2x/week for total of 12 visits over 12 week certification period   PT DURATION: 12 weeks   PLANNED INTERVENTIONS: Therapeutic exercises, Therapeutic activity, Neuromuscular re-education, Balance training, Gait training, Patient/Family education, Self Care, Joint mobilization, Joint manipulation, Vestibular training, Canalith repositioning, Orthotic/Fit training, Aquatic Therapy, Dry Needling, Electrical stimulation, Spinal manipulation, Spinal mobilization, Cryotherapy, Moist heat, Traction, Ultrasound, and Re-evaluation   PLAN FOR NEXT SESSION: lumbar/hip/cervical ROM, LE MMT, prone not tolerated     Sedalia Muta, PT, DPT 1:32 PM  02/11/23

## 2023-02-16 ENCOUNTER — Ambulatory Visit (INDEPENDENT_AMBULATORY_CARE_PROVIDER_SITE_OTHER): Payer: Medicare Other | Admitting: Physical Therapy

## 2023-02-16 ENCOUNTER — Encounter: Payer: Self-pay | Admitting: Physical Therapy

## 2023-02-16 DIAGNOSIS — M6281 Muscle weakness (generalized): Secondary | ICD-10-CM

## 2023-02-16 DIAGNOSIS — M542 Cervicalgia: Secondary | ICD-10-CM

## 2023-02-16 NOTE — Therapy (Signed)
OUTPATIENT PHYSICAL THERAPY TREATMENT NOTE   Patient Name: Austin [name] MRN: [id number] DOB:1940/12/28, 82 y.o., male Today's Date: 02/16/2023  PCP: Ardith Dark, MD   REFERRING PROVIDER: Ardith Dark, MD  END OF SESSION:   PT End of Session - 02/16/23 1433     Visit Number 9    Number of Visits 12    Date for PT Re-Evaluation 04/06/23    Authorization Type medicare    PT Start Time 1435    PT Stop Time 1513    PT Time Calculation (min) 38 min    Activity Tolerance Patient tolerated treatment well    Behavior During Therapy [facility] for tasks assessed/performed             Past Medical History:  Diagnosis Date   Asthma    Depression    Hypertension    Prostate cancer (HCC)    Urinary incontinence    Past Surgical History:  Procedure Laterality Date   GALLBLADDER SURGERY  1997   REPLACEMENT TOTAL KNEE BILATERAL  2001   2007   surgery replacement Bilateral 2005   Patient Active Problem List   Diagnosis Date Noted   Viral URI 12/10/2022   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   Seasonal allergic rhinitis 08/31/2021   Vitamin B12 deficiency 02/10/2021   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   Chronic low back pain 02/11/2020   Insomnia 02/11/2020   Hyperglycemia 11/08/2019   Vitamin D deficiency 11/08/2019   Essential hypertension 11/08/2019   Gastroesophageal reflux disease 11/08/2019   Depression, major, single episode, 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   Cervical disc disease 11/08/2019   Asthma, persistent controlled 11/08/2019   Prostate cancer (HCC) 11/08/2019   Status post cataract extraction 11/08/2019   Dyslipidemia 11/08/2019    THERAPY DIAG:  Cervicalgia  Muscle weakness (generalized)  REFERRING DIAG: M50.90 (ICD-10-CM) - Cervical disc disease     Rationale for Evaluation and Treatment: Rehabilitation   ONSET DATE: a couple years   SUBJECTIVE:                                                                                                                                                                                                           SUBJECTIVE STATEMENT: 02/16/2023 States that he has been using the cane and leg is better. States that he isn't using it all the time but when he does use it he feels a little better. States he already got his exercise in this morning. States he has been icing but hasn't tried heat yet.  States things seem to be going alright. States he feels like the needling helped for the day and then his symptoms returned.   Eval:  Right sided neck pain reported and in right low back. States that the neck pain has been going on for 1-2 years with no change over the last 2 years. States he talked to MD about pain who suggested PT.  States neck pain is like a clunk and when he does his neck exercises he can get like a clunking feeling when he moves it wrong. States certain movements make his neck pain worse.    Low back pain has been going on for a long time and he can't walk with his wife without his back pain and asthma. Reports that walking and standing makes his pain in his back worse. Pain is described as sharp at times but not sure if that goes into legs. States pain usually on the right.    States he goes to the senior center and takes tai chi and has no pain with this.      Hand dominance: Right   PERTINENT HISTORY:  Asthma, HTN, B TKR, B shoulder surgery (total shoulder per pt <5 years ago), diaphragmatic hernia repaired    PAIN:  Are you having pain? Yes neck is 5/10 sciatica and generalized stiffness.   PRECAUTIONS: None   WEIGHT BEARING RESTRICTIONS: No   FALLS:  Has patient fallen in last 6 months? No     OCCUPATION: not working   PLOF: Independent   PATIENT GOALS: to have less pain   OBJECTIVE:    DIAGNOSTIC FINDINGS:  None at this time   COGNITION: Overall cognitive status: Within functional limits for tasks assessed   SENSATION: Not tested    POSTURE: rounded shoulders, forward head, and flexed trunk    PALPATION: Tenderness to palpation along right lumbar paraspinals and glutes      CERVICAL ROM:    Active ROM A/PROM (deg) eval  Flexion WFL  Extension WFL  Right lateral flexion 15*  Left lateral flexion 10*  Right rotation 45*  Left rotation 40*   (Blank rows = not tested) * Pulling sensation on the right              Lumber APROM:    01/12/2023      Flexion  50% limited     Extension  90% limited     R ROT       L ROT       R SB  75% limited    L SB 75% limited                          * Pain              (Blank rows = not tested)       LE Measurements       Lower Extremity Right 01/12/2023 Left 01/12/2023    A/PROM MMT A/PROM MMT  Hip Flexion WFL 4 WFL 4-  Hip Extension          Hip Abduction          Hip Adduction          Hip Internal rotation 30   5    Hip External rotation 45   20    Knee Flexion   4   4  Knee Extension   4-   4-  Ankle Dorsiflexion  Ankle Plantarflexion          Ankle Inversion          Ankle Eversion           (Blank rows = not tested) * pain               UE Measurements       Upper Extremity Right 01/12/2023 Left 01/12/2023    A/PROM MMT A/PROM MMT  Shoulder Flexion 135* 4* 130 4  Shoulder Extension          Shoulder Abduction 120 (stiff) 4- 140 4  Shoulder Adduction          Shoulder Internal Rotation   4   4  Shoulder External Rotation Reaches to C7 SP (tight) 3+ Reaches to T2 SP 4-  Elbow Flexion Reaches to T12 SP (tight)    Reaches to T8 SP     Elbow Extension          Wrist Flexion          Wrist Extension          Wrist Supination          Wrist Pronation          Wrist Ulnar Deviation          Wrist Radial Deviation          Grip Strength NA   NA                          (Blank rows = not tested)                       * pain     SPECIAL TESTS:  Slump test neg B Straight leg test neg B  Ely's + B   FUNCTIONAL TESTS:  Holds breath with  transitional movements- abdominal hernia present   TODAY'S TREATMENT:                                                                                                                              DATE:   02/16/2023 Therapeutic Exercise:    Aerobic: Supine:bent knee fall outs 5" holds 2 minutes, piriformis stretch x2 30" holds IR and ER each B, bent knee fall ins 2 minutes total B, walking with focus on standing tall 1.5 minutes total, . Prone:    Seated:  cervical ROT 2 minutes    Standing:  walking with focus on standing tall 1.5 minutes total, ervical ROT 2 minutes    Standing: back up against the wall standing tall - 2 minute bouts x3, then walking with improved posture Neuromuscular Re-education: Manual Therapy:    Gait training:   Modalities: thermotherapy to glutes during supine exercises  Previous:  Therapeutic Exercise:    Aerobic: Supine: bent knee fall outs 5" holds 2 minutes, piriformis stretch x2 30" holds IR and ER each B,  bent knee fall ins 2 minutes total B, walking with focus on standing tall 1.5 minutes total,  hip extension stretch 2 minutes total 10" holds B, windshield wipers 2 minutes B Prone:    Seated: Neuromuscular Re-education: Manual Therapy: Gait training: Self Care:   Trigger Point Dry Needling:  Modalities: thermo therapy during seated exercises applied to right hip/glutes   PATIENT EDUCATION:  Education details: on HEP, on trying heat for stiffness Person educated: Patient Education method: Explanation, Demonstration, and Handouts Education comprehension: verbalized understanding     HOME EXERCISE PROGRAM: [id number]   ASSESSMENT:   CLINICAL IMPRESSION: 02/16/2023 Trailed heat today secondary to continued stiffness and patient not trying heat yet. Tolerated well with slight reduction in symptoms. Continued with lumbar and hip exercises secondary from sciatica limiting ability to perform cervical exercises on this date. Reduced tension initially but  with return to walking patient immediately returned to slumped position and symptoms returned in hip and back. Standing tall with back up against the was was initially difficult for patient and reported stiffness after but significantly improved upright posture with standing after this exercise was performed and reduced leg symptoms. Instructed patient to perform throughout the day.  Eval: Patient presents to physical therapy with complaints of neck and low back pain that has been present for a couple of years.  Patient limited in range of motion and strength that is contributing to current level of function.  Educated patient in benefits of physical therapy as well as basic home exercise program.  Answered all questions send patient would greatly benefit from skilled physical therapy at this time to improve overall function and quality of life.   OBJECTIVE IMPAIRMENTS: Abnormal gait, decreased activity tolerance, decreased knowledge of use of DME, difficulty walking, decreased ROM, decreased strength, improper body mechanics, postural dysfunction, and pain.    ACTIVITY LIMITATIONS: lifting, bending, standing, sleeping, transfers, and locomotion level   PARTICIPATION LIMITATIONS: meal prep, cleaning, and community activity   PERSONAL FACTORS: Fitness and 1-2 comorbidities: abdominal hernia, B TKA  are also affecting patient's functional outcome.    REHAB POTENTIAL: Good   CLINICAL DECISION MAKING: Stable/uncomplicated   EVALUATION COMPLEXITY: Low     GOALS: Goals reviewed with patient? yes   SHORT TERM GOALS: Target date: 02/23/2023  Patient will be independent in self management strategies to improve quality of life and functional outcomes. Baseline: New Program Goal status: INITIAL   2.  Patient will report at least 50% improvement in overall symptoms and/or function to demonstrate improved functional mobility Baseline: 0% better Goal status: INITIAL   3.  Patient will be able to  demonstrate pain-free neck range of motion in all directions Baseline: Painful Goal status: INITIAL           LONG TERM GOALS: Target date: 04/06/2023    Patient will report at least 75% improvement in overall symptoms and/or function to demonstrate improved functional mobility Baseline: 0% better Goal status: INITIAL   2.  Patient will demonstrate improved lumbar range of motion by least 25% in all directions Baseline: see above Goal status: INITIAL   3.  Patient will report performing daily mobility routine to reduce stiffness in neck and back Baseline: Not currently Goal status: INITIAL       PLAN:   PT FREQUENCY: 1-2x/week for total of 12 visits over 12 week certification period   PT DURATION: 12 weeks   PLANNED INTERVENTIONS: Therapeutic exercises, Therapeutic activity, Neuromuscular re-education, Balance training, Gait training, Patient/Family education, Self Care,  Joint mobilization, Joint manipulation, Vestibular training, Canalith repositioning, Orthotic/Fit training, Aquatic Therapy, Dry Needling, Electrical stimulation, Spinal manipulation, Spinal mobilization, Cryotherapy, Moist heat, Traction, Ultrasound, and Re-evaluation   PLAN FOR NEXT SESSION: lumbar/hip/cervical ROM, LE MMT, prone not tolerated     3:13 PM, 02/16/23 Tereasa Coop, DPT Physical Therapy with [facility] L[facility]

## 2023-02-18 ENCOUNTER — Encounter: Payer: Self-pay | Admitting: Physical Therapy

## 2023-02-18 ENCOUNTER — Ambulatory Visit (INDEPENDENT_AMBULATORY_CARE_PROVIDER_SITE_OTHER): Payer: Medicare Other | Admitting: Physical Therapy

## 2023-02-18 DIAGNOSIS — M6281 Muscle weakness (generalized): Secondary | ICD-10-CM

## 2023-02-18 DIAGNOSIS — M542 Cervicalgia: Secondary | ICD-10-CM

## 2023-02-18 NOTE — Therapy (Addendum)
OUTPATIENT PHYSICAL THERAPY TREATMENT NOTE   Patient Name: Austin [name] MRN: [id number] DOB:May 30, 1941, 82 y.o., male Today's Date: 02/18/2023  PCP: Ardith Dark, MD   REFERRING PROVIDER: Ardith Dark, MD  END OF SESSION:   PT End of Session - 02/18/23 1101     Visit Number 10    Number of Visits 12    Date for PT Re-Evaluation 04/06/23    Authorization Type medicare    PT Start Time 1103    PT Stop Time 1144    PT Time Calculation (min) 41 min    Activity Tolerance Patient tolerated treatment well    Behavior During Therapy [facility] for tasks assessed/performed             Past Medical History:  Diagnosis Date   Asthma    Depression    Hypertension    Prostate cancer (HCC)    Urinary incontinence    Past Surgical History:  Procedure Laterality Date   GALLBLADDER SURGERY  1997   REPLACEMENT TOTAL KNEE BILATERAL  2001   2007   surgery replacement Bilateral 2005   Patient Active Problem List   Diagnosis Date Noted   Viral URI 12/10/2022   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   Seasonal allergic rhinitis 08/31/2021   Vitamin B12 deficiency 02/10/2021   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   Chronic low back pain 02/11/2020   Insomnia 02/11/2020   Hyperglycemia 11/08/2019   Vitamin D deficiency 11/08/2019   Essential hypertension 11/08/2019   Gastroesophageal reflux disease 11/08/2019   Depression, major, single episode, 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   Cervical disc disease 11/08/2019   Asthma, persistent controlled 11/08/2019   Prostate cancer (HCC) 11/08/2019   Status post cataract extraction 11/08/2019   Dyslipidemia 11/08/2019    THERAPY DIAG:  Cervicalgia  Muscle weakness (generalized)  REFERRING DIAG: M50.90 (ICD-10-CM) - Cervical disc disease     Rationale for Evaluation and Treatment: Rehabilitation   ONSET DATE: a couple years   SUBJECTIVE:                                                                                                                                                                                                           SUBJECTIVE STATEMENT: 02/18/2023 States doing a bit better. He feels very stiff in AM. He has been doing exercises in AM to loosen up. Neck still feels tight as well. He has been trying not to use the cane.   Eval:  Right sided neck pain reported and in right low back. States  that the neck pain has been going on for 1-2 years with no change over the last 2 years. States he talked to MD about pain who suggested PT.  States neck pain is like a clunk and when he does his neck exercises he can get like a clunking feeling when he moves it wrong. States certain movements make his neck pain worse.    Low back pain has been going on for a long time and he can't walk with his wife without his back pain and asthma. Reports that walking and standing makes his pain in his back worse. Pain is described as sharp at times but not sure if that goes into legs. States pain usually on the right.    States he [facility] to the senior center and takes tai chi and has no pain with this.      Hand dominance: Right   PERTINENT HISTORY:  Asthma, HTN, B TKR, B shoulder surgery (total shoulder per pt <5 years ago), diaphragmatic hernia repaired    PAIN:  Are you having pain? Yes neck is 5/10 sciatica and generalized stiffness.   PRECAUTIONS: None   WEIGHT BEARING RESTRICTIONS: No   FALLS:  Has patient fallen in last 6 months? No     OCCUPATION: not working   PLOF: Independent   PATIENT GOALS: to have less pain   OBJECTIVE:    DIAGNOSTIC FINDINGS:  None at this time   COGNITION: Overall cognitive status: Within functional limits for tasks assessed   SENSATION: Not tested   POSTURE: rounded shoulders, forward head, and flexed trunk    PALPATION: Tenderness to palpation along right lumbar paraspinals and glutes      CERVICAL ROM:    Active ROM A/PROM (deg) eval  Flexion  WFL  Extension WFL  Right lateral flexion 15*  Left lateral flexion 10*  Right rotation 45*  Left rotation 40*   (Blank rows = not tested) * Pulling sensation on the right              Lumber APROM:    01/12/2023      Flexion  50% limited     Extension  90% limited     R ROT       L ROT       R SB  75% limited    L SB 75% limited                          * Pain              (Blank rows = not tested)       LE Measurements       Lower Extremity Right 01/12/2023 Left 01/12/2023    A/PROM MMT A/PROM MMT  Hip Flexion WFL 4 WFL 4-  Hip Extension          Hip Abduction          Hip Adduction          Hip Internal rotation 30   5    Hip External rotation 45   20    Knee Flexion   4   4  Knee Extension   4-   4-  Ankle Dorsiflexion          Ankle Plantarflexion          Ankle Inversion          Ankle Eversion           (  Blank rows = not tested) * pain               UE Measurements       Upper Extremity Right 01/12/2023 Left 01/12/2023    A/PROM MMT A/PROM MMT  Shoulder Flexion 135* 4* 130 4  Shoulder Extension          Shoulder Abduction 120 (stiff) 4- 140 4  Shoulder Adduction          Shoulder Internal Rotation   4   4  Shoulder External Rotation Reaches to C7 SP (tight) 3+ Reaches to T2 SP 4-  Elbow Flexion Reaches to T12 SP (tight)    Reaches to T8 SP     Elbow Extension          Wrist Flexion          Wrist Extension          Wrist Supination          Wrist Pronation          Wrist Ulnar Deviation          Wrist Radial Deviation          Grip Strength NA   NA                          (Blank rows = not tested)                       * pain     SPECIAL TESTS:  Slump test neg B Straight leg test neg B  Ely's + B   FUNCTIONAL TESTS:  Holds breath with transitional movements- abdominal hernia present   TODAY'S TREATMENT:                                                                                                                              DATE:     02/18/2023 Therapeutic Exercise:    Aerobic: Supine:bent knee fall outs 5" holds 2 minutes, piriformis stretch x2 30" holds IR and ER each B, Trial for Bridge x 10  with education on form Prone:    Seated:  cervical flex x 15 and ROT 1 minutes;  Seated HSS 30 sec x 3 bil;     Standing: back up against the wall standing tall - 2 minute bouts x3, then walking with cuing for posture Neuromuscular Re-education: Manual Therapy:    Gait training:   Modalities:   Previous:  Therapeutic Exercise:    Aerobic: Supine: bent knee fall outs 5" holds 2 minutes, piriformis stretch x2 30" holds IR and ER each B, bent knee fall ins 2 minutes total B, walking with focus on standing tall 1.5 minutes total,  hip extension stretch 2 minutes total 10" holds B, windshield wipers 2 minutes B Prone:    Seated: Neuromuscular Re-education: Manual Therapy: Gait training: Self Care:   Trigger Point Dry  Needling:  Modalities: thermo therapy during seated exercises applied to right hip/glutes   PATIENT EDUCATION:  Education details: on HEP, on trying heat for stiffness Person educated: Patient Education method: Explanation, Demonstration, and Handouts Education comprehension: verbalized understanding     HOME EXERCISE PROGRAM: [id number]   ASSESSMENT:   CLINICAL IMPRESSION: 02/18/2023 Discussed continuing use of cane at least when he is outside walking for decreased pressure on back and improved pain. Back and leg pain seem to be calming down .He is still getting mild pain in low back and glute region with increased walking today but less than previous weeks.  Focus on posture today when standing from a chair, as well as with walking. He has some improvement in ability for upright posture with walking., but is difficult to maintain.    Eval: Patient presents to physical therapy with complaints of neck and low back pain that has been present for a couple of years.  Patient limited in range of motion and  strength that is contributing to current level of function.  Educated patient in benefits of physical therapy as well as basic home exercise program.  Answered all questions send patient would greatly benefit from skilled physical therapy at this time to improve overall function and quality of life.   OBJECTIVE IMPAIRMENTS: Abnormal gait, decreased activity tolerance, decreased knowledge of use of DME, difficulty walking, decreased ROM, decreased strength, improper body mechanics, postural dysfunction, and pain.    ACTIVITY LIMITATIONS: lifting, bending, standing, sleeping, transfers, and locomotion level   PARTICIPATION LIMITATIONS: meal prep, cleaning, and community activity   PERSONAL FACTORS: Fitness and 1-2 comorbidities: abdominal hernia, B TKA  are also affecting patient's functional outcome.    REHAB POTENTIAL: Good   CLINICAL DECISION MAKING: Stable/uncomplicated   EVALUATION COMPLEXITY: Low     GOALS: Goals reviewed with patient? yes   SHORT TERM GOALS: Target date: 02/23/2023  Patient will be independent in self management strategies to improve quality of life and functional outcomes. Baseline: New Program Goal status: INITIAL   2.  Patient will report at least 50% improvement in overall symptoms and/or function to demonstrate improved functional mobility Baseline: 0% better Goal status: INITIAL   3.  Patient will be able to demonstrate pain-free neck range of motion in all directions Baseline: Painful Goal status: INITIAL           LONG TERM GOALS: Target date: 04/06/2023    Patient will report at least 75% improvement in overall symptoms and/or function to demonstrate improved functional mobility Baseline: 0% better Goal status: INITIAL   2.  Patient will demonstrate improved lumbar range of motion by least 25% in all directions Baseline: see above Goal status: INITIAL   3.  Patient will report performing daily mobility routine to reduce stiffness in neck and  back Baseline: Not currently Goal status: INITIAL       PLAN:   PT FREQUENCY: 1-2x/week for total of 12 visits over 12 week certification period   PT DURATION: 12 weeks   PLANNED INTERVENTIONS: Therapeutic exercises, Therapeutic activity, Neuromuscular re-education, Balance training, Gait training, Patient/Family education, Self Care, Joint mobilization, Joint manipulation, Vestibular training, Canalith repositioning, Orthotic/Fit training, Aquatic Therapy, Dry Needling, Electrical stimulation, Spinal manipulation, Spinal mobilization, Cryotherapy, Moist heat, Traction, Ultrasound, and Re-evaluation   PLAN FOR NEXT SESSION: lumbar/hip/cervical ROM, LE MMT, prone not tolerated     Sedalia Muta, PT, DPT 12:44 PM  02/18/23

## 2023-02-21 ENCOUNTER — Other Ambulatory Visit: Payer: Self-pay | Admitting: Internal Medicine

## 2023-02-23 ENCOUNTER — Encounter: Payer: Self-pay | Admitting: Physical Therapy

## 2023-02-23 ENCOUNTER — Ambulatory Visit (INDEPENDENT_AMBULATORY_CARE_PROVIDER_SITE_OTHER): Payer: Medicare Other | Admitting: Physical Therapy

## 2023-02-23 DIAGNOSIS — M6281 Muscle weakness (generalized): Secondary | ICD-10-CM | POA: Diagnosis not present

## 2023-02-23 DIAGNOSIS — M542 Cervicalgia: Secondary | ICD-10-CM | POA: Diagnosis not present

## 2023-02-23 NOTE — Therapy (Signed)
OUTPATIENT PHYSICAL THERAPY TREATMENT NOTE/ PN   Patient Name: Austin [name] MRN: [id number] DOB:1941-10-07, 82 y.o., male Today's Date: 02/23/2023  PCP: Ardith Dark, MD   REFERRING PROVIDER: Ardith Dark, MD   Physical Therapy Progress Note  Dates of Reporting Period: 01/12/23 to  02/23/23    END OF SESSION:   PT End of Session - 02/23/23 1048     Visit Number 11    Number of Visits 20    Date for PT Re-Evaluation 04/06/23    Authorization Type medicare  PN done at visit 11.    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   Seasonal allergic rhinitis 08/31/2021   Vitamin B12 deficiency 02/10/2021   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   Chronic low back pain 02/11/2020   Insomnia 02/11/2020   Hyperglycemia 11/08/2019   Vitamin D deficiency 11/08/2019   Essential hypertension 11/08/2019   Gastroesophageal reflux disease 11/08/2019   Depression, major, single episode, 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   Cervical disc disease 11/08/2019   Asthma, persistent controlled 11/08/2019   Prostate cancer (HCC) 11/08/2019   Status post cataract extraction 11/08/2019   Dyslipidemia 11/08/2019    THERAPY DIAG:  Cervicalgia  Muscle weakness (generalized)  REFERRING DIAG: M50.90 (ICD-10-CM) - Cervical disc disease     Rationale for Evaluation and Treatment: Rehabilitation    ONSET DATE: a couple years   SUBJECTIVE:                                                                                                                                                                                                          SUBJECTIVE STATEMENT: 02/23/2023 States still feeling stiff in back and neck, but he has been doing some stretching in the mornings that is helping. He still feels soreness into bil  buttocks, mostly with standing activity. Has been using cane for longer duration walking .  Eval:  Right sided neck pain reported and in right low back. States that the neck pain has been going on for 1-2 years with no change over the last 2 years. States he talked to MD about pain who suggested PT.  States neck pain is like a clunk and when he does his neck exercises he can get like a clunking feeling when he moves it wrong. States certain movements make his neck pain worse.    Low back pain has been going on for a long time and he can't walk with his wife without his back pain and asthma. Reports that walking and standing makes his pain in his back worse. Pain is described as sharp at times but not sure if that goes into legs. States pain usually on the right.    States he goes to the senior center and takes tai chi and has no pain with this.      Hand dominance: Right   PERTINENT HISTORY:  Asthma, HTN, B TKR, B shoulder surgery (total shoulder per pt <5 years ago), diaphragmatic hernia repaired    PAIN:  Are you having pain? Yes neck is 5/10 sciatica and generalized stiffness.   PRECAUTIONS: None   WEIGHT BEARING RESTRICTIONS: No   FALLS:  Has patient fallen in last 6 months? No     OCCUPATION: not working   PLOF: Independent   PATIENT GOALS: to have less pain   OBJECTIVE: updated 02/23/23   DIAGNOSTIC FINDINGS:  None at this time   COGNITION: Overall cognitive status: Within functional limits for tasks assessed   SENSATION: Not tested   POSTURE:  rounded shoulders, forward head, and flexed trunk    PALPATION: Tenderness to palpation along right lumbar paraspinals and glutes      CERVICAL ROM:    Active ROM A/PROM (deg) eval AROM 02/23/23  Flexion WFL   Extension WFL   Right lateral flexion 15* 20  Left lateral flexion 10* 15  Right rotation 45* 55  Left rotation 40* 55   (Blank rows = not tested) * Pulling sensation on the right              Lumber APROM:    01/12/2023   AROM 02/23/23   Flexion  50% limited Hands to toes (decreased motion from back, more from hamstrings )    Extension  90% limited 80 % limited     R ROT       L ROT       R SB  75% limited 50 % limited   L SB 75% limited 50 % limited                          * Pain              (Blank rows = not tested)       LE Measurements         Lower Extremity Right 01/12/2023 Left 01/12/2023 R 02/23/23 L 02/23/23    A/PROM MMT A/PROM MMT    Hip Flexion WFL 4 WFL 4- 4+ 4-  Hip Extension            Hip Abduction            Hip Adduction            Hip Internal rotation 30   5  Hip External rotation 45   20      Knee Flexion   4   4 4+ 4+  Knee Extension   4-   4- 4+ 4+  Ankle Dorsiflexion            Ankle Plantarflexion            Ankle Inversion            Ankle Eversion             (Blank rows = not tested) * pain               UE Measurements       Upper Extremity Right 01/12/2023 Left 01/12/2023    A/PROM MMT A/PROM MMT  Shoulder Flexion 135* 4* 130 4  Shoulder Extension          Shoulder Abduction 120 (stiff) 4- 140 4  Shoulder Adduction          Shoulder Internal Rotation   4   4  Shoulder External Rotation Reaches to C7 SP (tight) 3+ Reaches to T2 SP 4-  Elbow Flexion Reaches to T12 SP (tight)    Reaches to T8 SP     Elbow Extension          Wrist Flexion          Wrist Extension          Wrist Supination          Wrist Pronation          Wrist Ulnar Deviation          Wrist Radial Deviation          Grip Strength NA   NA                           (Blank rows = not tested)                       * pain     SPECIAL TESTS:  Slump test neg B Straight leg test neg B  Ely's + B   FUNCTIONAL TESTS:  Holds breath with transitional movements- abdominal hernia present   TODAY'S TREATMENT:                                                                                                                              DATE:    02/23/2023 Therapeutic Exercise: Aerobic: Supine:  Hip flexor stretch off side of table 20 sec x 3 bil;  Bridge 2 x 10  with education on form Prone: Seated:  shoulder flexion with cane x 10 , 5 sec holds;  Standing: at mat table, with quad set and glute set for upright posture x 20;  lumbar extension 2 x 10;  Standing hip ext x 10 bil at counter;  Neuromuscular Re-education: Manual Therapy:    Gait training:  Modalities:   Previous:  Therapeutic Exercise:    Aerobic: Supine: bent knee fall outs 5" holds 2 minutes, piriformis stretch x2 30" holds IR and ER each B, bent knee fall ins 2 minutes total B, walking with focus on standing tall 1.5 minutes total,  hip extension stretch 2 minutes total 10" holds B, windshield wipers 2 minutes B Prone:    Seated: Neuromuscular Re-education: Manual Therapy: Gait training: Self Care:   Trigger Point Dry Needling:  Modalities: thermo therapy during seated exercises applied to right hip/glutes   PATIENT EDUCATION:  Education details: on HEP, on trying heat for stiffness Person educated: Patient Education method: Explanation, Demonstration, and Handouts Education comprehension: verbalized understanding     HOME EXERCISE PROGRAM: [id number]   ASSESSMENT:   CLINICAL IMPRESSION:  02/23/2023 Pt has been seen for 11 visits. He has improved neck ROM today, with no pain with movement. He does have popping and stiffness at times that is bothersome to him, but ROM and pain improved from Eval. He as improving pain in LE from previous weeks, but still having  soreness into R glute with standing. He has stiffness in hips and lumbar spine that make full upright posture challenging. He is improving with home management and mobility for improving stiffness. He will benefit from continued care, for improving back and hip ROM, strength, posture and pain.   Eval: Patient presents to physical therapy with complaints of neck and low back pain that has been present for a couple of years.  Patient limited in range of motion and strength that is contributing to current level of function.  Educated patient in benefits of physical therapy as well as basic home exercise program.  Answered all questions send patient would greatly benefit from skilled physical therapy at this time to improve overall function and quality of life.   OBJECTIVE IMPAIRMENTS: Abnormal gait, decreased activity tolerance, decreased knowledge of use of DME, difficulty walking, decreased ROM, decreased strength, improper body mechanics, postural dysfunction, and pain.    ACTIVITY LIMITATIONS: lifting, bending, standing, sleeping, transfers, and locomotion level   PARTICIPATION LIMITATIONS: meal prep, cleaning, and community activity   PERSONAL FACTORS: Fitness and 1-2 comorbidities: abdominal hernia, B TKA  are also affecting patient's functional outcome.    REHAB POTENTIAL: Good   CLINICAL DECISION MAKING: Stable/uncomplicated   EVALUATION COMPLEXITY: Low     GOALS: Goals reviewed with patient? yes   SHORT TERM GOALS: Target date: 02/23/2023  Patient will be independent in self management strategies to improve quality of life and functional outcomes. Baseline: New Program Goal status: MET   2.  Patient will report at least 50% improvement in overall symptoms and/or function to demonstrate improved functional mobility Baseline: 0% better Goal status: partially met   3.  Patient will be able to demonstrate pain-free neck range of motion in all directions Baseline: Painful Goal  status: MET           LONG TERM GOALS: Target date: 04/06/2023    Patient will report at least 75% improvement in overall symptoms and/or function to demonstrate improved functional mobility Baseline: 0% better Goal status: ongoing    2.  Patient will demonstrate improved lumbar range of motion by least 25% in all directions Baseline: see above Goal status: ongoing    3.  Patient will report performing daily mobility routine to reduce stiffness in neck and back Baseline: Not currently Goal status: partially met        PLAN:   PT FREQUENCY:  1-2x/week for total of 12 visits over 12 week certification period   PT DURATION: 12 weeks   PLANNED INTERVENTIONS: Therapeutic exercises, Therapeutic activity, Neuromuscular re-education, Balance training, Gait training, Patient/Family education, Self Care, Joint mobilization, Joint manipulation, Vestibular training, Canalith repositioning, Orthotic/Fit training, Aquatic Therapy, Dry Needling, Electrical stimulation, Spinal manipulation, Spinal mobilization, Cryotherapy, Moist heat, Traction, Ultrasound, and Re-evaluation   PLAN FOR NEXT SESSION: lumbar/hip/cervical ROM, LE MMT, prone not tolerated     Sedalia Muta, PT, DPT 12:01 PM  02/23/23

## 2023-02-25 ENCOUNTER — Ambulatory Visit (INDEPENDENT_AMBULATORY_CARE_PROVIDER_SITE_OTHER): Payer: Medicare Other | Admitting: Physical Therapy

## 2023-02-25 ENCOUNTER — Encounter: Payer: Self-pay | Admitting: Physical Therapy

## 2023-02-25 ENCOUNTER — Other Ambulatory Visit: Payer: Self-pay | Admitting: Family Medicine

## 2023-02-25 DIAGNOSIS — M6281 Muscle weakness (generalized): Secondary | ICD-10-CM | POA: Diagnosis not present

## 2023-02-25 DIAGNOSIS — M542 Cervicalgia: Secondary | ICD-10-CM | POA: Diagnosis not present

## 2023-02-25 NOTE — Therapy (Signed)
OUTPATIENT PHYSICAL THERAPY TREATMENT NOTE   Patient Name: Austin [name] MRN: [id number] DOB:1941-02-24, 82 y.o., male Today's Date: 02/25/2023  PCP: Ardith Dark, MD   REFERRING PROVIDER: Ardith Dark, MD   END OF SESSION:   PT End of Session - 02/25/23 1057     Visit Number 12    Number of Visits 20    Date for PT Re-Evaluation 04/06/23    Authorization Type medicare  PN done at visit 11.    PT Start Time 1056    PT Stop Time 1138    PT Time Calculation (min) 42 min    Activity Tolerance Patient tolerated treatment well    Behavior During Therapy WFL for tasks assessed/performed             Past Medical History:  Diagnosis Date   Asthma    Depression    Hypertension    Prostate cancer (HCC)    Urinary incontinence    Past Surgical History:  Procedure Laterality Date   GALLBLADDER SURGERY  1997   REPLACEMENT TOTAL KNEE BILATERAL  2001   2007   surgery replacement Bilateral 2005   Patient Active Problem List   Diagnosis Date Noted   Viral URI 12/10/2022   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   Seasonal allergic rhinitis 08/31/2021   Vitamin B12 deficiency 02/10/2021   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   Chronic low back pain 02/11/2020   Insomnia 02/11/2020   Hyperglycemia 11/08/2019   Vitamin D deficiency 11/08/2019   Essential hypertension 11/08/2019   Gastroesophageal reflux disease 11/08/2019   Depression, major, single episode, 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   Cervical disc disease 11/08/2019   Asthma, persistent controlled 11/08/2019   Prostate cancer (HCC) 11/08/2019   Status post cataract extraction 11/08/2019   Dyslipidemia 11/08/2019    THERAPY DIAG:  Cervicalgia  Muscle weakness (generalized)  REFERRING DIAG: M50.90 (ICD-10-CM) - Cervical disc disease     Rationale for Evaluation and Treatment: Rehabilitation   ONSET DATE: a couple years   SUBJECTIVE:                                                                                                                                                                                                           SUBJECTIVE STATEMENT: 02/25/2023 States feeling stiff in mornings, he has been doing some stretches in AM.  He has been doing [facility], had increased soreness in buttocks after class this week.   Eval:  Right sided neck pain reported and  in right low back. States that the neck pain has been going on for 1-2 years with no change over the last 2 years. States he talked to MD about pain who suggested PT.  States neck pain is like a clunk and when he does his neck exercises he can get like a clunking feeling when he moves it wrong. States certain movements make his neck pain worse.    Low back pain has been going on for a long time and he can't walk with his wife without his back pain and asthma. Reports that walking and standing makes his pain in his back worse. Pain is described as sharp at times but not sure if that goes into legs. States pain usually on the right.    States he goes to the senior center and takes tai chi and has no pain with this.      Hand dominance: Right   PERTINENT HISTORY:  Asthma, HTN, B TKR, B shoulder surgery (total shoulder per pt <5 years ago), diaphragmatic hernia repaired    PAIN:  Are you having pain? Yes neck is 5/10 sciatica and generalized stiffness.   PRECAUTIONS: None   WEIGHT BEARING RESTRICTIONS: No   FALLS:  Has patient fallen in last 6 months? No     OCCUPATION: not working   PLOF: Independent   PATIENT GOALS: to have less pain   OBJECTIVE: updated 02/23/23   DIAGNOSTIC FINDINGS:  None at this time   COGNITION: Overall cognitive status: Within functional limits for tasks assessed   SENSATION: Not tested   POSTURE: rounded shoulders, forward head, and flexed trunk    PALPATION: Tenderness to palpation along right lumbar paraspinals and glutes      CERVICAL  ROM:    Active ROM A/PROM (deg) eval AROM 02/23/23  Flexion WFL   Extension WFL   Right lateral flexion 15* 20  Left lateral flexion 10* 15  Right rotation 45* 55  Left rotation 40* 55   (Blank rows = not tested) * Pulling sensation on the right              Lumber APROM:    01/12/2023   AROM 02/23/23   Flexion  50% limited Hands to toes (decreased motion from back, more from hamstrings )    Extension  90% limited 80 % limited     R ROT       L ROT       R SB  75% limited 50 % limited   L SB 75% limited 50 % limited                          * Pain              (Blank rows = not tested)       LE Measurements         Lower Extremity Right 01/12/2023 Left 01/12/2023 R 02/23/23 L 02/23/23    A/PROM MMT A/PROM MMT    Hip Flexion WFL 4 WFL 4- 4+ 4-  Hip Extension            Hip Abduction            Hip Adduction            Hip Internal rotation 30   5      Hip External rotation 45   20      Knee Flexion   4   4  4+ 4+  Knee Extension   4-   4- 4+ 4+  Ankle Dorsiflexion            Ankle Plantarflexion            Ankle Inversion            Ankle Eversion             (Blank rows = not tested) * pain               UE Measurements       Upper Extremity Right 01/12/2023 Left 01/12/2023    A/PROM MMT A/PROM MMT  Shoulder Flexion 135* 4* 130 4  Shoulder Extension          Shoulder Abduction 120 (stiff) 4- 140 4  Shoulder Adduction          Shoulder Internal Rotation   4   4  Shoulder External Rotation Reaches to C7 SP (tight) 3+ Reaches to T2 SP 4-  Elbow Flexion Reaches to T12 SP (tight)    Reaches to T8 SP     Elbow Extension          Wrist Flexion          Wrist Extension          Wrist Supination          Wrist Pronation          Wrist Ulnar Deviation          Wrist Radial Deviation          Grip Strength NA   NA                          (Blank rows = not tested)                       * pain     SPECIAL TESTS:  Slump test neg B Straight leg test neg B  Ely's +  B   FUNCTIONAL TESTS:  Holds breath with transitional movements- abdominal hernia present   TODAY'S TREATMENT:                                                                                                                              DATE:    02/25/2023  Therapeutic Exercise: Aerobic: Bike L1  2 min, x 2 with rest break for breathing  Supine:  hip ER fallouts x 10;  supine piriformis x 2 bil;  Prone: Seated:  Education on stretching after his standing tai chi class: Seated HSS 30 sec x 3 bil;  Seated lumbar fwd flexion stretch x 3;    Standing: Hip abd 3 x 5 bil;  Marching with trunk stability x 20; Mini squats at counter x 15;  Walking: 3.5 min in clinic ( 2 standing rest breaks during this time ) Using SPC.  Neuromuscular Re-education: Manual Therapy:  L hip PROM, for flex, ER, IR, and abduction.  Long leg distraction bil ;  Gait training:   Modalities:    Therapeutic Exercise: Aerobic: Supine:  Hip flexor stretch off side of table 20 sec x 3 bil;  Bridge 2 x 10  with education on form Prone: Seated:  shoulder flexion with cane x 10 , 5 sec holds;  Standing: at mat table, with quad set and glute set for upright posture x 20;  lumbar extension 2 x 10;  Standing hip ext x 10 bil at counter;  Neuromuscular Re-education: Manual Therapy:    Gait training:   Modalities:   Previous:  Therapeutic Exercise:    Aerobic: Supine: bent knee fall outs 5" holds 2 minutes, piriformis stretch x2 30" holds IR and ER each B, bent knee fall ins 2 minutes total B, walking with focus on standing tall 1.5 minutes total,  hip extension stretch 2 minutes total 10" holds B, windshield wipers 2 minutes B Prone:    Seated: Neuromuscular Re-education: Manual Therapy: Gait training: Self Care:   Trigger Point Dry Needling:  Modalities: thermo therapy during seated exercises applied to right hip/glutes   PATIENT EDUCATION:  Education details: on HEP, on trying heat for stiffness Person  educated: Patient Education method: Explanation, Demonstration, and Handouts Education comprehension: verbalized understanding     HOME EXERCISE PROGRAM: [id number]   ASSESSMENT:   CLINICAL IMPRESSION:  02/25/2023 Reviewed seated stretching that pt can do after his tai chi class if back is sore.  Discussed slow progression of cardio machines and walking with attention to his breathing limitations. His endurance is quite limited due to his asthma. Reviewed continuing hip mobility for L hip, due to stiffness. Pt to benefit from continued mobility and strengthening for back and LEs.   Eval: Patient presents to physical therapy with complaints of neck and low back pain that has been present for a couple of years.  Patient limited in range of motion and strength that is contributing to current level of function.  Educated patient in benefits of physical therapy as well as basic home exercise program.  Answered all questions send patient would greatly benefit from skilled physical therapy at this time to improve overall function and quality of life.   OBJECTIVE IMPAIRMENTS: Abnormal gait, decreased activity tolerance, decreased knowledge of use of DME, difficulty walking, decreased ROM, decreased strength, improper body mechanics, postural dysfunction, and pain.    ACTIVITY LIMITATIONS: lifting, bending, standing, sleeping, transfers, and locomotion level   PARTICIPATION LIMITATIONS: meal prep, cleaning, and community activity   PERSONAL FACTORS: Fitness and 1-2 comorbidities: abdominal hernia, B TKA  are also affecting patient's functional outcome.    REHAB POTENTIAL: Good   CLINICAL DECISION MAKING: Stable/uncomplicated   EVALUATION COMPLEXITY: Low     GOALS: Goals reviewed with patient? yes   SHORT TERM GOALS: Target date: 02/23/2023  Patient will be independent in self management strategies to improve quality of life and functional outcomes. Baseline: New Program Goal status: MET    2.  Patient will report at least 50% improvement in overall symptoms and/or function to demonstrate improved functional mobility Baseline: 0% better Goal status: partially met   3.  Patient will be able to demonstrate pain-free neck range of motion in all directions Baseline: Painful Goal status: MET           LONG TERM GOALS: Target date: 04/06/2023    Patient will report at least 75% improvement in overall symptoms and/or function to demonstrate  improved functional mobility Baseline: 0% better Goal status: ongoing    2.  Patient will demonstrate improved lumbar range of motion by least 25% in all directions Baseline: see above Goal status: ongoing    3.  Patient will report performing daily mobility routine to reduce stiffness in neck and back Baseline: Not currently Goal status: partially met        PLAN:   PT FREQUENCY: 1-2x/week for total of 12 visits over 12 week certification period   PT DURATION: 12 weeks   PLANNED INTERVENTIONS: Therapeutic exercises, Therapeutic activity, Neuromuscular re-education, Balance training, Gait training, Patient/Family education, Self Care, Joint mobilization, Joint manipulation, Vestibular training, Canalith repositioning, Orthotic/Fit training, Aquatic Therapy, Dry Needling, Electrical stimulation, Spinal manipulation, Spinal mobilization, Cryotherapy, Moist heat, Traction, Ultrasound, and Re-evaluation   PLAN FOR NEXT SESSION: lumbar/hip/cervical ROM, LE MMT, prone not tolerated     Sedalia Muta, PT, DPT 1:35 PM  02/25/23

## 2023-03-02 ENCOUNTER — Ambulatory Visit (INDEPENDENT_AMBULATORY_CARE_PROVIDER_SITE_OTHER): Payer: Medicare Other | Admitting: Physical Therapy

## 2023-03-02 ENCOUNTER — Encounter: Payer: Self-pay | Admitting: Physical Therapy

## 2023-03-02 DIAGNOSIS — M542 Cervicalgia: Secondary | ICD-10-CM

## 2023-03-02 DIAGNOSIS — M6281 Muscle weakness (generalized): Secondary | ICD-10-CM | POA: Diagnosis not present

## 2023-03-02 NOTE — Therapy (Signed)
OUTPATIENT PHYSICAL THERAPY TREATMENT NOTE   Patient Name: Bear Gala MRN: [id number] DOB:11-06-1940, 82 y.o., male Today's Date: 03/02/2023  PCP: Ardith Dark, MD   REFERRING PROVIDER: Ardith Dark, MD   END OF SESSION:   PT End of Session - 03/02/23 1301     Visit Number 13    Number of Visits 20    Date for PT Re-Evaluation 04/06/23    Authorization Type medicare  PN done at visit 11.    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   Seasonal allergic rhinitis 08/31/2021   Vitamin B12 deficiency 02/10/2021   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   Chronic low back pain 02/11/2020   Insomnia 02/11/2020   Hyperglycemia 11/08/2019   Vitamin D deficiency 11/08/2019   Essential hypertension 11/08/2019   Gastroesophageal reflux disease 11/08/2019   Depression, major, single episode, 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   Cervical disc disease 11/08/2019   Asthma, persistent controlled 11/08/2019   Prostate cancer (HCC) 11/08/2019   Status post cataract extraction 11/08/2019   Dyslipidemia 11/08/2019    THERAPY DIAG:  Cervicalgia  Muscle weakness (generalized)  REFERRING DIAG: M50.90 (ICD-10-CM) - Cervical disc disease     Rationale for Evaluation and Treatment: Rehabilitation   ONSET DATE: a couple years   SUBJECTIVE:                                                                                                                                                                                                           SUBJECTIVE STATEMENT: 03/02/2023 States that he went to the senior center earlier this week and went to the gym and did some exercises and rode the bike. States he felt good afterwards. States his right leg is bothering him right now.  Eval:  Right sided neck  pain reported and in right low back. States that the neck pain has been going on for 1-2 years with no change over the last 2 years. States he talked to MD about pain who suggested PT.  States neck pain is like a clunk and when he does his neck exercises he can get like a clunking feeling when he moves it wrong. States certain movements make his neck pain worse.    Low back pain has been going on for a long time and he can't walk with his wife without his back pain and asthma. Reports that walking and standing makes his pain in his back worse. Pain is described as sharp at times but not sure if that goes into legs. States pain usually on the right.    States he goes to the senior center and takes tai chi and has no pain with this.      Hand dominance: Right   PERTINENT HISTORY:  Asthma, HTN, B TKR, B shoulder surgery (total shoulder per pt <5 years ago), diaphragmatic hernia repaired    PAIN:  Are you having pain? Yes neck is 5/10 sciatica and generalized stiffness in right knee   PRECAUTIONS: None   WEIGHT BEARING RESTRICTIONS: No   FALLS:  Has patient fallen in last 6 months? No     OCCUPATION: not working   PLOF: Independent   PATIENT GOALS: to have less pain   OBJECTIVE: updated 02/23/23   DIAGNOSTIC FINDINGS:  None at this time   COGNITION: Overall cognitive status: Within functional limits for tasks assessed   SENSATION: Not tested   POSTURE: rounded shoulders, forward head, and flexed trunk    PALPATION: Tenderness to palpation along right lumbar  paraspinals and glutes      CERVICAL ROM:    Active ROM A/PROM (deg) eval AROM 02/23/23  Flexion WFL   Extension WFL   Right lateral flexion 15* 20  Left lateral flexion 10* 15  Right rotation 45* 55  Left rotation 40* 55   (Blank rows = not tested) * Pulling sensation on the right              Lumber APROM:    01/12/2023   AROM 02/23/23   Flexion  50% limited Hands to toes (decreased motion from back, more from hamstrings )    Extension  90% limited 80 % limited     R ROT       L ROT       R SB  75% limited 50 % limited   L SB 75% limited 50 % limited                          * Pain              (Blank rows = not tested)       LE Measurements         Lower Extremity Right 01/12/2023 Left 01/12/2023 R 02/23/23 L 02/23/23    A/PROM MMT A/PROM MMT    Hip Flexion WFL 4 WFL 4- 4+ 4-  Hip Extension            Hip Abduction            Hip Adduction            Hip Internal rotation 30   5      Hip External rotation 45   20      Knee Flexion  4   4 4+ 4+  Knee Extension   4-   4- 4+ 4+  Ankle Dorsiflexion            Ankle Plantarflexion            Ankle Inversion            Ankle Eversion             (Blank rows = not tested) * pain               UE Measurements       Upper Extremity Right 01/12/2023 Left 01/12/2023    A/PROM MMT A/PROM MMT  Shoulder Flexion 135* 4* 130 4  Shoulder Extension          Shoulder Abduction 120 (stiff) 4- 140 4  Shoulder Adduction          Shoulder Internal Rotation   4   4  Shoulder External Rotation Reaches to C7 SP (tight) 3+ Reaches to T2 SP 4-  Elbow Flexion Reaches to T12 SP (tight)    Reaches to T8 SP     Elbow Extension          Wrist Flexion          Wrist Extension          Wrist Supination          Wrist Pronation          Wrist Ulnar Deviation          Wrist Radial Deviation          Grip Strength NA   NA                          (Blank rows = not tested)                       * pain     SPECIAL TESTS:  Slump test neg  B Straight leg test neg B  Ely's + B   FUNCTIONAL TESTS:  Holds breath with transitional movements- abdominal hernia present   TODAY'S TREATMENT:                                                                                                                              DATE:    03/02/2023 Therapeutic Exercise:    Aerobic: bike L1 6.5 minutes Supine: piriformis stretch ER x3 30" holds B, bent knee flal outs 3 minutes alternating, hip IR one at a time 4 minutes, bridges 3x5  Prone:    standing: hip extension stretch at counter with glute sets 5"holds 2 minutes total, with back up against the wall x5 30" holds  Neuromuscular Re-education: Manual Therapy: Gait training: Self Care:   Trigger Point Dry Needling:  Modalities:    PATIENT EDUCATION:  Education details: on HEP  Person educated: Patient Education method: Explanation, Demonstration, and Handouts  Education comprehension: verbalized understanding     HOME EXERCISE PROGRAM: [id number]   ASSESSMENT:   CLINICAL IMPRESSION:  03/02/2023 Session focused on addressing leg pain and posture on this date as leg was really bothering him. Tolerated bike well and discussed going to senior center more often to use the bike to help with pain and stiffness. Reduced pain noted end of session. Printed off exercises for HEP adherence. Overall improved walking noted end of session and reduced symptoms.  Eval: Patient presents to physical therapy with complaints of neck and low back pain that has been present for a couple of years.  Patient limited in range of motion and strength that is contributing to current level of function.  Educated patient in benefits of physical therapy as well as basic home exercise program.  Answered all questions send patient would greatly benefit from skilled physical therapy at this time to improve overall function and quality of life.   OBJECTIVE IMPAIRMENTS: Abnormal gait, decreased activity tolerance, decreased  knowledge of use of DME, difficulty walking, decreased ROM, decreased strength, improper body mechanics, postural dysfunction, and pain.    ACTIVITY LIMITATIONS: lifting, bending, standing, sleeping, transfers, and locomotion level   PARTICIPATION LIMITATIONS: meal prep, cleaning, and community activity   PERSONAL FACTORS: Fitness and 1-2 comorbidities: abdominal hernia, B TKA  are also affecting patient's functional outcome.    REHAB POTENTIAL: Good   CLINICAL DECISION MAKING: Stable/uncomplicated   EVALUATION COMPLEXITY: Low     GOALS: Goals reviewed with patient? yes   SHORT TERM GOALS: Target date: 02/23/2023  Patient will be independent in self management strategies to improve quality of life and functional outcomes. Baseline: New Program Goal status: MET   2.  Patient will report at least 50% improvement in overall symptoms and/or function to demonstrate improved functional mobility Baseline: 0% better Goal status: partially met   3.  Patient will be able to demonstrate pain-free neck range of motion in all directions Baseline: Painful Goal status: MET           LONG TERM GOALS: Target date: 04/06/2023    Patient will report at least 75% improvement in overall symptoms and/or function to demonstrate improved functional mobility Baseline: 0% better Goal status: ongoing    2.  Patient will demonstrate improved lumbar range of motion by least 25% in all directions Baseline: see above Goal status: ongoing    3.  Patient will report performing daily mobility routine to reduce stiffness in neck and back Baseline: Not currently Goal status: partially met        PLAN:   PT FREQUENCY: 1-2x/week for total of 12 visits over 12 week certification period   PT DURATION: 12 weeks   PLANNED INTERVENTIONS: Therapeutic exercises, Therapeutic activity, Neuromuscular re-education, Balance training, Gait training, Patient/Family education, Self Care, Joint mobilization, Joint  manipulation, Vestibular training, Canalith repositioning, Orthotic/Fit training, Aquatic Therapy, Dry Needling, Electrical stimulation, Spinal manipulation, Spinal mobilization, Cryotherapy, Moist heat, Traction, Ultrasound, and Re-evaluation   PLAN FOR NEXT SESSION: lumbar/hip/cervical ROM, LE MMT, prone not tolerated     1:50 PM, 03/02/23 Tereasa Coop, DPT Physical Therapy with [facility]

## 2023-03-04 ENCOUNTER — Encounter: Payer: Medicare Other | Admitting: Physical Therapy

## 2023-03-09 ENCOUNTER — Encounter: Payer: Self-pay | Admitting: Physical Therapy

## 2023-03-09 ENCOUNTER — Ambulatory Visit (INDEPENDENT_AMBULATORY_CARE_PROVIDER_SITE_OTHER): Payer: Medicare Other | Admitting: Physical Therapy

## 2023-03-09 DIAGNOSIS — M6281 Muscle weakness (generalized): Secondary | ICD-10-CM | POA: Diagnosis not present

## 2023-03-09 DIAGNOSIS — M542 Cervicalgia: Secondary | ICD-10-CM

## 2023-03-09 NOTE — Therapy (Signed)
OUTPATIENT PHYSICAL THERAPY TREATMENT NOTE   Patient Name: Austin [name] MRN: [id number] DOB:1941-06-16, 82 y.o., male Today's Date: 03/09/2023  PCP: Ardith Dark, MD   REFERRING PROVIDER: Ardith Dark, MD   END OF SESSION:   PT End of Session - 03/09/23 1050     Visit Number 14    Number of Visits 20    Date for PT Re-Evaluation 04/06/23    Authorization Type medicare  PN done at visit 11.    PT Start Time 1100    PT Stop Time 1140    PT Time Calculation (min) 40 min    Activity Tolerance Patient tolerated treatment well    Behavior During Therapy WFL for tasks assessed/performed             Past Medical History:  Diagnosis Date   Asthma    Depression    Hypertension    Prostate cancer (HCC)    Urinary incontinence    Past Surgical History:  Procedure Laterality Date   GALLBLADDER SURGERY  1997   REPLACEMENT TOTAL KNEE BILATERAL  2001   2007   surgery replacement Bilateral 2005   Patient Active Problem List   Diagnosis Date Noted   Viral URI 12/10/2022   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   Seasonal allergic rhinitis 08/31/2021   Vitamin B12 deficiency 02/10/2021   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   Chronic low back pain 02/11/2020   Insomnia 02/11/2020   Hyperglycemia 11/08/2019   Vitamin D deficiency 11/08/2019   Essential hypertension 11/08/2019   Gastroesophageal reflux disease 11/08/2019   Depression, major, single episode, 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   Cervical disc disease 11/08/2019   Asthma, persistent controlled 11/08/2019   Prostate cancer (HCC) 11/08/2019   Status post cataract extraction 11/08/2019   Dyslipidemia 11/08/2019    THERAPY DIAG:  Cervicalgia  Muscle weakness (generalized)  REFERRING DIAG: M50.90 (ICD-10-CM) - Cervical disc disease     Rationale for Evaluation and Treatment: Rehabilitation   ONSET DATE: a couple years   SUBJECTIVE:                                                                                                                                                                                                           SUBJECTIVE STATEMENT: 03/09/2023 States that the leg has still been bothering him. States he has been doing some exercises and it helps a little but it doesn't last. Reports that the exercise but against the wall also helps.  Eval:  Right sided neck pain reported and  in right low back. States that the neck pain has been going on for 1-2 years with no change over the last 2 years. States he talked to MD about pain who suggested PT.  States neck pain is like a clunk and when he does his neck exercises he can get like a clunking feeling when he moves it wrong. States certain movements make his neck pain worse.    Low back pain has been going on for a long time and he can't walk with his wife without his back pain and asthma. Reports that walking and standing makes his pain in his back worse. Pain is described as sharp at times but not sure if that goes into legs. States pain usually on the right.    States [facility] the [facility] and takes tai chi and has no pain with this.      Hand dominance: Right   PERTINENT HISTORY:  Asthma, HTN, B TKR, B shoulder surgery (total shoulder per pt <5 years ago), diaphragmatic hernia repaired    PAIN:  Are you having pain? Yes neck is 6/10 sciatica and generalized stiffness in right knee   PRECAUTIONS: None   WEIGHT BEARING RESTRICTIONS: No   FALLS:  Has patient fallen in last 6 months? No     OCCUPATION: not working   PLOF: Independent   PATIENT GOALS: to have less pain   OBJECTIVE: updated 02/23/23   DIAGNOSTIC FINDINGS:  None at this time   COGNITION: Overall cognitive status: Within functional limits for tasks assessed   SENSATION: Not tested   POSTURE: rounded shoulders, forward head, and flexed trunk    PALPATION: Tenderness to palpation along right lumbar paraspinals and  glutes      CERVICAL ROM:    Active ROM A/PROM (deg) eval AROM 02/23/23  Flexion WFL   Extension WFL   Right lateral flexion 15* 20  Left lateral flexion 10* 15  Right rotation 45* 55  Left rotation 40* 55   (Blank rows = not tested) * Pulling sensation on the right              Lumber APROM:    01/12/2023   AROM 02/23/23   Flexion  50% limited Hands to toes (decreased motion from back, more from hamstrings )    Extension  90% limited 80 % limited     R ROT       L ROT       R SB  75% limited 50 % limited   L SB 75% limited 50 % limited                          * Pain              (Blank rows = not tested)       LE Measurements         Lower Extremity Right 01/12/2023 Left 01/12/2023 R 02/23/23 L 02/23/23    A/PROM MMT A/PROM MMT    Hip Flexion WFL 4 WFL 4- 4+ 4-  Hip Extension            Hip Abduction            Hip Adduction            Hip Internal rotation 30   5      Hip External rotation 45   20      Knee Flexion   4  4 4+ 4+  Knee Extension   4-   4- 4+ 4+  Ankle Dorsiflexion            Ankle Plantarflexion            Ankle Inversion            Ankle Eversion             (Blank rows = not tested) * pain               UE Measurements       Upper Extremity Right 01/12/2023 Left 01/12/2023    A/PROM MMT A/PROM MMT  Shoulder Flexion 135* 4* 130 4  Shoulder Extension          Shoulder Abduction 120 (stiff) 4- 140 4  Shoulder Adduction          Shoulder Internal Rotation   4   4  Shoulder External Rotation Reaches to C7 SP (tight) 3+ Reaches to T2 SP 4-  Elbow Flexion Reaches to T12 SP (tight)    Reaches to T8 SP     Elbow Extension          Wrist Flexion          Wrist Extension          Wrist Supination          Wrist Pronation          Wrist Ulnar Deviation          Wrist Radial Deviation          Grip Strength NA   NA                          (Blank rows = not tested)                       * pain     SPECIAL TESTS:  Slump test neg B Straight leg  test neg B  Ely's + B   FUNCTIONAL TESTS:  Holds breath with transitional movements- abdominal hernia present   TODAY'S TREATMENT:                                                                                                                              DATE:    03/09/2023 Therapeutic Exercise:    Aerobic: bike L1 5 minutes Supine:  Seated: hamstring stretch x3 30" holds B    standing: hip extension stretch at counter with glute sets 5"holds 2 minutes total, with back up against the wall x5 30" holds, shoulder flexion up wall 2x15 5-10" holds, self mobilization with tennis ball 5 minutes to lumbar muscles - tolerated.  Neuromuscular Re-education: side up against the wall cone on head posture holds - 6 minutes total, practiced walking with tall posture 6 minutes,  walking with walker for upright posture 3 minutes, Manual Therapy: Gait training: Self Care:  Trigger Point Dry Needling:  Modalities:    PATIENT EDUCATION:  Education details: on HEP  Person educated: Patient Education method: Programmer, multimedia, Demonstration, and Handouts Education comprehension: verbalized understanding     HOME EXERCISE PROGRAM: [id number]   ASSESSMENT:   CLINICAL IMPRESSION:  03/09/2023 Session continued to focus on posture and standing exercises at wall as these were tolerated best. Discussed and practiced possible benefits of using walker shor term to help keep upright posture and reduce pain in leg. Tolerated well in clinic but patient not convinced of benefits of walker at this time. Overall patient would continue to benefit from skilled PT at this time.   Eval: Patient presents to physical therapy with complaints of neck and low back pain that has been present for a couple of years.  Patient limited in range of motion and strength that is contributing to current level of function.  Educated patient in benefits of physical therapy as well as basic home exercise program.  Answered all questions send  patient would greatly benefit from skilled physical therapy at this time to improve overall function and quality of life.   OBJECTIVE IMPAIRMENTS: Abnormal gait, decreased activity tolerance, decreased knowledge of use of DME, difficulty walking, decreased ROM, decreased strength, improper body mechanics, postural dysfunction, and pain.    ACTIVITY LIMITATIONS: lifting, bending, standing, sleeping, transfers, and locomotion level   PARTICIPATION LIMITATIONS: meal prep, cleaning, and community activity   PERSONAL FACTORS: Fitness and 1-2 comorbidities: abdominal hernia, B TKA  are also affecting patient's functional outcome.    REHAB POTENTIAL: Good   CLINICAL DECISION MAKING: Stable/uncomplicated   EVALUATION COMPLEXITY: Low     GOALS: Goals reviewed with patient? yes   SHORT TERM GOALS: Target date: 02/23/2023  Patient will be independent in self management strategies to improve quality of life and functional outcomes. Baseline: New Program Goal status: MET   2.  Patient will report at least 50% improvement in overall symptoms and/or function to demonstrate improved functional mobility Baseline: 0% better Goal status: partially met   3.  Patient will be able to demonstrate pain-free neck range of motion in all directions Baseline: Painful Goal status: MET           LONG TERM GOALS: Target date: 04/06/2023    Patient will report at least 75% improvement in overall symptoms and/or function to demonstrate improved functional mobility Baseline: 0% better Goal status: ongoing    2.  Patient will demonstrate improved lumbar range of motion by least 25% in all directions Baseline: see above Goal status: ongoing    3.  Patient will report performing daily mobility routine to reduce stiffness in neck and back Baseline: Not currently Goal status: partially met        PLAN:   PT FREQUENCY: 1-2x/week for total of 12 visits over 12 week certification period   PT DURATION:  12 weeks   PLANNED INTERVENTIONS: Therapeutic exercises, Therapeutic activity, Neuromuscular re-education, Balance training, Gait training, Patient/Family education, Self Care, Joint mobilization, Joint manipulation, Vestibular training, Canalith repositioning, Orthotic/Fit training, Aquatic Therapy, Dry Needling, Electrical stimulation, Spinal manipulation, Spinal mobilization, Cryotherapy, Moist heat, Traction, Ultrasound, and Re-evaluation   PLAN FOR NEXT SESSION: lumbar/hip/cervical ROM, LE MMT, prone not tolerated     11:46 AM, 03/09/23 Tereasa Coop, DPT Physical Therapy with [facility]

## 2023-03-11 ENCOUNTER — Ambulatory Visit (INDEPENDENT_AMBULATORY_CARE_PROVIDER_SITE_OTHER): Payer: Medicare Other | Admitting: Physical Therapy

## 2023-03-11 ENCOUNTER — Encounter: Payer: Self-pay | Admitting: Physical Therapy

## 2023-03-11 DIAGNOSIS — M6281 Muscle weakness (generalized): Secondary | ICD-10-CM | POA: Diagnosis not present

## 2023-03-11 DIAGNOSIS — M542 Cervicalgia: Secondary | ICD-10-CM

## 2023-03-11 NOTE — Therapy (Signed)
OUTPATIENT PHYSICAL THERAPY TREATMENT NOTE   Patient Name: Austin [name] MRN: [id number] DOB:16-Jun-1941, 82 y.o., male Today's Date: 03/11/2023  PCP: Ardith Dark, MD   REFERRING PROVIDER: Ardith Dark, MD   END OF SESSION:   PT End of Session - 03/11/23 1054     Visit Number 15    Number of Visits 20    Date for PT Re-Evaluation 04/06/23    Authorization Type medicare  PN done at visit 11.    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   Seasonal allergic rhinitis 08/31/2021   Vitamin B12 deficiency 02/10/2021   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   Chronic low back pain 02/11/2020   Insomnia 02/11/2020   Hyperglycemia 11/08/2019   Vitamin D deficiency 11/08/2019   Essential hypertension 11/08/2019   Gastroesophageal reflux disease 11/08/2019   Depression, major, single episode, 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   Cervical disc disease 11/08/2019   Asthma, persistent controlled 11/08/2019   Prostate cancer (HCC) 11/08/2019   Status post cataract extraction 11/08/2019   Dyslipidemia 11/08/2019    THERAPY DIAG:  Cervicalgia  Muscle weakness (generalized)  REFERRING DIAG: M50.90 (ICD-10-CM) - Cervical disc disease     Rationale for Evaluation and Treatment: Rehabilitation   ONSET DATE: a couple years   SUBJECTIVE:                                                                                                                                                                                                           SUBJECTIVE STATEMENT: 03/11/2023 States that the leg has still been bothering him. Exercises help, but it doesn't last.   Eval:  Right sided neck pain reported and in right low back. States that the neck pain has been going on for 1-2 years with no change  over the last 2 years. States he talked to MD about pain who suggested PT.  States neck pain is like a clunk and when he does his neck exercises he can get like a clunking feeling when he moves it wrong. States certain movements make his neck pain worse.    Low back pain has been going on for a long time and he can't walk with his wife without his back pain and asthma. Reports that walking and standing makes his pain in his back worse. Pain is described as sharp at times but not sure if that goes into legs. States pain usually on the right.    States he goes to the senior center and takes tai chi and has no pain with this.      Hand dominance: Right   PERTINENT HISTORY:  Asthma, HTN, B TKR, B shoulder surgery (total shoulder per pt <5 years ago), diaphragmatic hernia repaired    PAIN:  Are you having pain? Yes neck is 6/10 sciatica and generalized stiffness in right knee   PRECAUTIONS: None   WEIGHT BEARING RESTRICTIONS: No   FALLS:  Has patient fallen in last 6 months? No     OCCUPATION: not working   PLOF: Independent   PATIENT GOALS: to have less pain   OBJECTIVE: updated 02/23/23   DIAGNOSTIC FINDINGS:  None at this time   COGNITION: Overall cognitive status: Within functional limits for tasks assessed   SENSATION: Not tested   POSTURE: rounded shoulders, forward head, and flexed trunk    PALPATION: Tenderness to palpation along right lumbar paraspinals and glutes      CERVICAL ROM:    Active ROM A/PROM (deg) eval AROM 02/23/23  Flexion WFL   Extension WFL    Right lateral flexion 15* 20  Left lateral flexion 10* 15  Right rotation 45* 55  Left rotation 40* 55   (Blank rows = not tested) * Pulling sensation on the right              Lumber APROM:    01/12/2023   AROM 02/23/23   Flexion  50% limited Hands to toes (decreased motion from back, more from hamstrings )    Extension  90% limited 80 % limited     R ROT       L ROT       R SB  75% limited 50 % limited   L SB 75% limited 50 % limited                          * Pain              (Blank rows = not tested)       LE Measurements         Lower Extremity Right 01/12/2023 Left 01/12/2023 R 02/23/23 L 02/23/23    A/PROM MMT A/PROM MMT    Hip Flexion WFL 4 WFL 4- 4+ 4-  Hip Extension            Hip Abduction            Hip Adduction            Hip Internal rotation 30   5      Hip External rotation 45   20      Knee Flexion   4   4 4+ 4+  Knee Extension   4-   4- 4+ 4+  Ankle Dorsiflexion  Ankle Plantarflexion            Ankle Inversion            Ankle Eversion             (Blank rows = not tested) * pain               UE Measurements       Upper Extremity Right 01/12/2023 Left 01/12/2023    A/PROM MMT A/PROM MMT  Shoulder Flexion 135* 4* 130 4  Shoulder Extension          Shoulder Abduction 120 (stiff) 4- 140 4  Shoulder Adduction          Shoulder Internal Rotation   4   4  Shoulder External Rotation Reaches to C7 SP (tight) 3+ Reaches to T2 SP 4-  Elbow Flexion Reaches to T12 SP (tight)    Reaches to T8 SP     Elbow Extension          Wrist Flexion          Wrist Extension          Wrist Supination          Wrist Pronation          Wrist Ulnar Deviation          Wrist Radial Deviation          Grip Strength NA   NA                          (Blank rows = not tested)                       * pain     SPECIAL TESTS:  Slump test neg B Straight leg test neg B  Ely's + B   FUNCTIONAL TESTS:  Holds breath with transitional movements- abdominal hernia  present   TODAY'S TREATMENT:                                                                                                                              DATE:    03/11/2023 Therapeutic Exercise:    Aerobic: bike L1 6 minutes Supine: glute sets 5 sec x 15; hip ER x 10;  Seated: hamstring stretch x3 30" holds B, piriformis stretch 30 sec x 3 bil; seated fwd flexion x 10;     standing: hip extension stretch at counter with glute sets 5"holds 2 minutes total, with back up against the wall x5 30" holds,  L SB for R opening/stretch x 15 at wall  Neuromuscular Re-education:  Manual Therapy: PROM hip flex, ER, and extension ( in s/l)  Gait training: Self Care:   Modalities:    PATIENT EDUCATION:  Education details: on HEP , recommended he get appt with sports med for his back if not improving significantly in next couple months.  Person educated: Patient Education method: Explanation, Demonstration, and Handouts Education comprehension: verbalized understanding     HOME EXERCISE PROGRAM: [id number]   ASSESSMENT:   CLINICAL IMPRESSION:  03/11/2023 Pt getting good relief from sessions, but carryover has been limited. He is doing well with frequent motion with HEP at home. Recommended he see sports med in next couple months if pain relief does not improve. Pt to benefit from continued care at this time.   Eval: Patient presents to physical therapy with complaints of neck and low back pain that has been present for a couple of years.  Patient limited in range of motion and strength that is contributing to current level of function.  Educated patient in benefits of physical therapy as well as basic home exercise program.  Answered all questions send patient would greatly benefit from skilled physical therapy at this time to improve overall function and quality of life.   OBJECTIVE IMPAIRMENTS: Abnormal gait, decreased activity tolerance, decreased knowledge of use of DME, difficulty walking,  decreased ROM, decreased strength, improper body mechanics, postural dysfunction, and pain.    ACTIVITY LIMITATIONS: lifting, bending, standing, sleeping, transfers, and locomotion level   PARTICIPATION LIMITATIONS: meal prep, cleaning, and community activity   PERSONAL FACTORS: Fitness and 1-2 comorbidities: abdominal hernia, B TKA  are also affecting patient's functional outcome.    REHAB POTENTIAL: Good   CLINICAL DECISION MAKING: Stable/uncomplicated   EVALUATION COMPLEXITY: Low     GOALS: Goals reviewed with patient? yes   SHORT TERM GOALS: Target date: 02/23/2023  Patient will be independent in self management strategies to improve quality of life and functional outcomes. Baseline: New Program Goal status: MET   2.  Patient will report at least 50% improvement in overall symptoms and/or function to demonstrate improved functional mobility Baseline: 0% better Goal status: partially met   3.  Patient will be able to demonstrate pain-free neck range of motion in all directions Baseline: Painful Goal status: MET           LONG TERM GOALS: Target date: 04/06/2023    Patient will report at least 75% improvement in overall symptoms and/or function to demonstrate improved functional mobility Baseline: 0% better Goal status: ongoing    2.  Patient will demonstrate improved lumbar range of motion by least 25% in all directions Baseline: see above Goal status: ongoing    3.  Patient will report performing daily mobility routine to reduce stiffness in neck and back Baseline: Not currently Goal status: partially met        PLAN:   PT FREQUENCY: 1-2x/week for total of 12 visits over 12 week certification period   PT DURATION: 12 weeks   PLANNED INTERVENTIONS: Therapeutic exercises, Therapeutic activity, Neuromuscular re-education, Balance training, Gait training, Patient/Family education, Self Care, Joint mobilization, Joint manipulation, Vestibular training, Canalith  repositioning, Orthotic/Fit training, Aquatic Therapy, Dry Needling, Electrical stimulation, Spinal manipulation, Spinal mobilization, Cryotherapy, Moist heat, Traction, Ultrasound, and Re-evaluation   PLAN FOR NEXT SESSION: lumbar/hip/cervical ROM, LE MMT, prone not tolerated     Sedalia Muta, PT, DPT 10:54 AM  03/11/23

## 2023-03-16 ENCOUNTER — Encounter: Payer: Medicare Other | Admitting: Physical Therapy

## 2023-03-23 ENCOUNTER — Encounter: Payer: Medicare Other | Admitting: Physical Therapy

## 2023-03-25 ENCOUNTER — Encounter: Payer: Medicare Other | Admitting: Physical Therapy

## 2023-03-29 LAB — GLUCOSE, POCT (MANUAL RESULT ENTRY): Glucose Fasting, POC: 87 mg/dL (ref 70–99)

## 2023-03-31 ENCOUNTER — Encounter: Payer: Self-pay | Admitting: Family Medicine

## 2023-03-31 ENCOUNTER — Ambulatory Visit (INDEPENDENT_AMBULATORY_CARE_PROVIDER_SITE_OTHER): Payer: Medicare Other | Admitting: Family Medicine

## 2023-03-31 VITALS — BP 137/74 | HR 80 | Temp 97.7°F | Ht 69.0 in | Wt 191.0 lb

## 2023-03-31 DIAGNOSIS — R739 Hyperglycemia, unspecified: Secondary | ICD-10-CM

## 2023-03-31 DIAGNOSIS — F321 Major depressive disorder, single episode, moderate: Secondary | ICD-10-CM

## 2023-03-31 DIAGNOSIS — I10 Essential (primary) hypertension: Secondary | ICD-10-CM

## 2023-03-31 DIAGNOSIS — E785 Hyperlipidemia, unspecified: Secondary | ICD-10-CM

## 2023-03-31 DIAGNOSIS — E559 Vitamin D deficiency, unspecified: Secondary | ICD-10-CM

## 2023-03-31 DIAGNOSIS — C61 Malignant neoplasm of prostate: Secondary | ICD-10-CM | POA: Diagnosis not present

## 2023-03-31 LAB — CBC
HCT: 42.2 % (ref 39.0–52.0)
Hemoglobin: 14.1 g/dL (ref 13.0–17.0)
MCHC: 33.4 g/dL (ref 30.0–36.0)
MCV: 98.6 fl (ref 78.0–100.0)
Platelets: 240 10*3/uL (ref 150.0–400.0)
RBC: 4.28 Mil/uL (ref 4.22–5.81)
RDW: 14 % (ref 11.5–15.5)
WBC: 5.9 10*3/uL (ref 4.0–10.5)

## 2023-03-31 LAB — COMPREHENSIVE METABOLIC PANEL
ALT: 60 U/L — ABNORMAL HIGH (ref 0–53)
AST: 38 U/L — ABNORMAL HIGH (ref 0–37)
Albumin: 3.9 g/dL (ref 3.5–5.2)
Alkaline Phosphatase: 63 U/L (ref 39–117)
BUN: 15 mg/dL (ref 6–23)
CO2: 29 mEq/L (ref 19–32)
Calcium: 9 mg/dL (ref 8.4–10.5)
Chloride: 103 mEq/L (ref 96–112)
Creatinine, Ser: 0.59 mg/dL (ref 0.40–1.50)
GFR: 90.47 mL/min (ref >60.00)
Glucose, Bld: 92 mg/dL (ref 70–99)
Potassium: 3.9 mEq/L (ref 3.5–5.1)
Sodium: 140 mEq/L (ref 135–145)
Total Bilirubin: 0.8 mg/dL (ref 0.2–1.2)
Total Protein: 7.4 g/dL (ref 6.0–8.3)

## 2023-03-31 LAB — LIPID PANEL
Cholesterol: 157 mg/dL (ref 0–200)
HDL: 49.8 mg/dL (ref >39.00)
LDL Cholesterol: 91 mg/dL (ref 0–99)
NonHDL: 107.32
Total CHOL/HDL Ratio: 3
Triglycerides: 83 mg/dL (ref 0.0–149.0)
VLDL: 16.6 mg/dL (ref 0.0–40.0)

## 2023-03-31 LAB — POCT GLYCOSYLATED HEMOGLOBIN (HGB A1C): Hemoglobin A1C: 5.8 % — AB (ref 4.0–5.6)

## 2023-03-31 LAB — TSH: TSH: 1.87 u[IU]/mL (ref 0.35–5.50)

## 2023-03-31 LAB — PSA: PSA: 6.58 ng/mL — ABNORMAL HIGH (ref 0.10–4.00)

## 2023-03-31 LAB — VITAMIN D 25 HYDROXY (VIT D DEFICIENCY, FRACTURES): VITD: 40.92 ng/mL (ref 30.00–100.00)

## 2023-03-31 NOTE — Patient Instructions (Signed)
It was very nice to see you today!  Your A1c and blood pressure look great today.  I am okay with you coming off the Lexapro.  Please let me know in a few weeks how this is doing.  Will check blood work today.  Return in about 6 months (around 09/30/2023).   Take care, Dr Jimmey Ralph  PLEASE NOTE:  If you had any lab tests, please let us know if you have not heard back within a few days. You may see your results on mychart before we have a chance to review them but we will give you a call once they are reviewed by [place].   If we ordered any referrals today, please let us know if you have not heard from their office within the next week.   If you had any urgent prescriptions sent in today, please check with the pharmacy within an hour of our visit to make sure the prescription was transmitted appropriately.   Please try these tips to maintain a healthy lifestyle:  Eat at least 3 REAL meals and 1-2 snacks per day.  Aim for no more than 5 hours between eating.  If you eat breakfast, please do so within one hour of getting up.   Each meal should contain half fruits/vegetables, one quarter protein, and one quarter carbs (no bigger than a computer mouse)  Cut down on sweet beverages. This includes juice, soda, and sweet tea.   Drink at least 1 glass of water with each meal and aim for at least 8 glasses per day  Exercise at least 150 minutes every week.

## 2023-03-31 NOTE — Progress Notes (Signed)
   Ruel Tosi is a 82 y.o. male who presents today for an office visit.  Assessment/Plan:  Chronic Problems Addressed Today: Hyperglycemia A1c improved to 5.8.  He will continue lifestyle modifications.  Recheck 6 months.  Essential hypertension At goal on amlodipine 5 mg daily losartan milligrams daily.  Dyslipidemia On Lipitor 80 mg daily and fenofibrate 200 mg daily.  Check lipids today.  Depression, major, single episode, moderate (HCC) He discontinued the Lexapro couple of weeks ago.  Has not been doing well off of this.  He would like to stay off for now.  He will let us know if he has any worsening symptoms or if any new symptoms arise.  Prostate cancer [facility]) Check PSA.  Vitamin D deficiency Check vitamin D.      Subjective:  HPI:  See A/P for status of chronic conditions.  Patient is here today for follow-up.  We last saw him about 5 months ago.  At that time A1c was 6.4. He has been working on diet and exercise.  Cutting down on sweets.       Objective:  Physical Exam: BP 137/74   Pulse 80   Temp 97.7 F (36.5 C) (Temporal)   Ht 5\' 9"  (1.753 m)   Wt 191 lb (86.6 kg)   SpO2 96%   BMI 28.21 kg/m   Gen: No acute distress, resting comfortably CV: Regular rate and rhythm with no murmurs appreciated Pulm: Normal work of breathing, clear to auscultation bilaterally with no crackles, wheezes, or rhonchi Neuro: Grossly normal, moves all extremities Psych: Normal affect and thought content      [place] M. Jimmey Ralph, MD 03/31/2023 2:14 PM

## 2023-03-31 NOTE — Assessment & Plan Note (Signed)
A1c improved to 5.8.  He will continue lifestyle modifications.  Recheck 6 months.

## 2023-03-31 NOTE — Assessment & Plan Note (Signed)
On Lipitor 80 mg daily and fenofibrate 200 mg daily.  Check lipids today.

## 2023-03-31 NOTE — Assessment & Plan Note (Signed)
Check vitamin D. 

## 2023-03-31 NOTE — Assessment & Plan Note (Signed)
At goal on amlodipine 5 mg daily losartan milligrams daily.

## 2023-03-31 NOTE — Assessment & Plan Note (Signed)
He discontinued the Lexapro couple of weeks ago.  Has not been doing well off of this.  He would like to stay off for now.  He will let us know if he has any worsening symptoms or if any new symptoms arise.

## 2023-03-31 NOTE — Assessment & Plan Note (Signed)
Check PSA. ?

## 2023-04-01 ENCOUNTER — Ambulatory Visit (INDEPENDENT_AMBULATORY_CARE_PROVIDER_SITE_OTHER): Payer: Medicare Other | Admitting: Physical Therapy

## 2023-04-01 ENCOUNTER — Encounter: Payer: Self-pay | Admitting: Physical Therapy

## 2023-04-01 DIAGNOSIS — M542 Cervicalgia: Secondary | ICD-10-CM | POA: Diagnosis not present

## 2023-04-01 DIAGNOSIS — M6281 Muscle weakness (generalized): Secondary | ICD-10-CM | POA: Diagnosis not present

## 2023-04-01 NOTE — Therapy (Signed)
OUTPATIENT PHYSICAL THERAPY TREATMENT NOTE   Patient Name: Austin [name] MRN: [id number] DOB:December 16, 1940, 82 y.o., male Today's Date: 04/01/2023  PCP: Ardith Dark, MD   REFERRING PROVIDER: Ardith Dark, MD   END OF SESSION:   PT End of Session - 04/01/23 1056     Visit Number 16    Number of Visits 20    Date for PT Re-Evaluation 04/06/23    Authorization Type medicare  PN done at visit 11.    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   Seasonal allergic rhinitis 08/31/2021   Vitamin B12 deficiency 02/10/2021   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   Chronic low back pain 02/11/2020   Insomnia 02/11/2020   Hyperglycemia 11/08/2019   Vitamin D deficiency 11/08/2019   Essential hypertension 11/08/2019   Gastroesophageal reflux disease 11/08/2019   Depression, major, single episode, 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   Cervical disc disease 11/08/2019   Asthma, persistent controlled 11/08/2019   Prostate cancer (HCC) 11/08/2019   Status post cataract extraction 11/08/2019   Dyslipidemia 11/08/2019    THERAPY DIAG:  Cervicalgia  Muscle weakness (generalized)  REFERRING DIAG: M50.90 (ICD-10-CM) - Cervical disc disease     Rationale for Evaluation and Treatment: Rehabilitation   ONSET DATE: a couple years   SUBJECTIVE:                                                                                                                                                                                                           SUBJECTIVE STATEMENT: 04/01/2023 States he has sports med appt next week. States continued stiffness in neck and back/legs. Doing well with HEP. Feels like his pain is doing better, but still frustrated with stiffness.   Eval:  Right sided neck pain reported and in right low  back. States that the neck pain has been going on for 1-2 years with no change over the last 2 years. States he talked to MD about pain who suggested PT.  States neck pain is like a clunk and when he does his neck exercises he can get like a clunking feeling when he moves it wrong. States certain movements make his neck pain worse.    Low back pain has been going on for a long time and he can't walk with his wife without his back pain and asthma. Reports that walking and standing makes his pain in his back worse. Pain is described as sharp at times but not sure if that goes into legs. States pain usually on the right.    States he goes to the senior center and takes tai chi and has no pain with this.      Hand dominance: Right   PERTINENT HISTORY:  Asthma, HTN, B TKR, B shoulder surgery (total shoulder per pt <5 years ago), diaphragmatic hernia repaired    PAIN:  Are you having pain? Yes neck is 3-8 at worst. Most of the time 2-3/10  sciatica    PRECAUTIONS: None   WEIGHT BEARING RESTRICTIONS: No   FALLS:  Has patient fallen in last 6 months? No     OCCUPATION: not working   PLOF: Independent   PATIENT GOALS: to have less pain   OBJECTIVE: updated 04/01/23   DIAGNOSTIC FINDINGS:  None at this time   COGNITION: Overall cognitive status: Within functional limits for tasks assessed   SENSATION: Not tested   POSTURE: rounded shoulders, forward head, and flexed trunk       CERVICAL ROM:    Active ROM A/PROM (deg) eval AROM 02/23/23 04/01/23  Flexion  WFL    Extension WFL    Right lateral flexion 15* 20   Left lateral flexion 10* 15   Right rotation 45* 55 55  Left rotation 40* 55 65   (Blank rows = not tested) * Pulling sensation on the right              Lumber APROM:    01/12/2023   AROM 02/23/23 04/01/23   Flexion  50% limited Hands to toes (decreased motion from back, more from hamstrings )  Hands to toes (decreased motion from back, more from hamstrings )   Extension  90% limited 80 % limited      R ROT        L ROT        R SB  75% limited 50 % limited  25 % limited  L SB 75% limited 50 % limited   25 % limited                        * Pain              (Blank rows = not tested)       LE Measurements           Lower Extremity Right 01/12/2023 Left 01/12/2023 R 02/23/23 L 02/23/23 R 6/21 L 6/21    A/PROM MMT A/PROM MMT      Hip Flexion WFL 4 WFL 4- 4+ 4- 4+ 4-  Hip Extension              Hip Abduction              Hip Adduction  Hip Internal rotation 30   5        Hip External rotation 45   20        Knee Flexion   4   4 4+ 4+    Knee Extension   4-   4- 4+ 4+    Ankle Dorsiflexion              Ankle Plantarflexion              Ankle Inversion              Ankle Eversion               (Blank rows = not tested) * pain               UE Measurements       Upper Extremity Right 01/12/2023 Left 01/12/2023    A/PROM MMT A/PROM MMT  Shoulder Flexion 135* 4* 130 4  Shoulder Extension          Shoulder Abduction 120 (stiff) 4- 140 4  Shoulder Adduction          Shoulder Internal Rotation   4   4  Shoulder External Rotation Reaches to C7 SP (tight) 3+ Reaches to T2 SP 4-  Elbow Flexion Reaches to T12 SP (tight)    Reaches to T8 SP     Elbow Extension          Wrist Flexion          Wrist Extension          Wrist Supination          Wrist Pronation          Wrist Ulnar Deviation          Wrist Radial Deviation          Grip Strength NA   NA                          (Blank rows = not tested)                        * pain     SPECIAL TESTS:  Slump test neg B Straight leg test neg B  Ely's + B   FUNCTIONAL TESTS:  Holds breath with transitional movements- abdominal hernia present   TODAY'S TREATMENT:                                                                                                                              DATE:    04/01/2023 Therapeutic Exercise:    Aerobic: bike L1 6 minutes Supine: hip ER x 10;  SLR x 10 bil;  bridging x 10  SKTC 30 sec x 3 bil;  LTR x 10 ;  Seated: hamstring stretch x3 30"  holds B,   seated fwd flexion x 5   standing:  Neuromuscular Re-education:  Manual Therapy:  Gait training: Self Care:   Modalities:    PATIENT EDUCATION:  Education details: on HEP, and d/c plan  Person educated: Patient Education method: Explanation, Demonstration, and Handouts Education comprehension: verbalized understanding     HOME EXERCISE PROGRAM: [id number]   ASSESSMENT:   CLINICAL IMPRESSION:  04/01/2023 Pt has sports med appt next week. He has been doing HEP and going to senior center. He thinks he is having less pain overall. He has met most goals. He is still feeling pain in his buttocks , but is doing much better with self management and HEP. Pt ready for d/c to HEP to continue to manage stiffness and improve strength. Pt agrees with hold on PT at this time, he will see sports med and continue HEP.   Eval: Patient presents to physical therapy with complaints of neck and low back pain that has been present for a couple of years.  Patient limited in range of motion and strength that is contributing to current level of function.  Educated patient in benefits of physical therapy as well as basic home exercise program.  Answered all questions send patient would greatly benefit from skilled physical therapy at this time to improve overall function and quality of life.   OBJECTIVE IMPAIRMENTS: Abnormal gait, decreased activity tolerance, decreased knowledge of  use of DME, difficulty walking, decreased ROM, decreased strength, improper body mechanics, postural dysfunction, and pain.    ACTIVITY LIMITATIONS: lifting, bending, standing, sleeping, transfers, and locomotion level   PARTICIPATION LIMITATIONS: meal prep, cleaning, and community activity   PERSONAL FACTORS: Fitness and 1-2 comorbidities: abdominal hernia, B TKA  are also affecting patient's functional outcome.    REHAB POTENTIAL: Good   CLINICAL DECISION MAKING: Stable/uncomplicated   EVALUATION COMPLEXITY: Low     GOALS: Goals reviewed with patient? yes   SHORT TERM GOALS: Target date: 02/23/2023  Patient will be independent in self management strategies to improve quality of life and functional outcomes. Baseline: New Program Goal status: MET   2.  Patient will report at least 50% improvement in overall symptoms and/or function to demonstrate improved functional mobility Baseline: 0% better Goal status: partially met   3.  Patient will be able to demonstrate pain-free neck range of motion in all directions Baseline: Painful Goal status: MET       LONG TERM GOALS: Target date: 04/06/2023    Patient will report at least 75% improvement in overall symptoms and/or function to demonstrate improved functional mobility Baseline: 0% better Goal status: Partially met    2.  Patient will demonstrate improved lumbar range of motion by least 25% in all directions Baseline: see above Goal status: partially met    3.  Patient will report performing daily mobility routine to reduce stiffness in neck and back Baseline: Not currently Goal status: MET       PLAN:   PT FREQUENCY: 1-2x/week for total of 12 visits over 12 week certification period   PT DURATION: 12 weeks   PLANNED INTERVENTIONS: Therapeutic exercises, Therapeutic activity, Neuromuscular re-education, Balance training, Gait training, Patient/Family education, Self Care, Joint mobilization, Joint manipulation,  Vestibular training, Canalith repositioning, Orthotic/Fit training, Aquatic Therapy, Dry Needling, Electrical stimulation, Spinal manipulation, Spinal mobilization, Cryotherapy, Moist heat, Traction, Ultrasound, and Re-evaluation   PLAN FOR NEXT SESSION:  Sedalia Muta, PT, DPT 11:43 AM  04/01/23

## 2023-04-04 ENCOUNTER — Ambulatory Visit (INDEPENDENT_AMBULATORY_CARE_PROVIDER_SITE_OTHER): Payer: Medicare Other

## 2023-04-04 ENCOUNTER — Other Ambulatory Visit: Payer: Self-pay

## 2023-04-04 ENCOUNTER — Encounter: Payer: Self-pay | Admitting: Family Medicine

## 2023-04-04 ENCOUNTER — Ambulatory Visit (INDEPENDENT_AMBULATORY_CARE_PROVIDER_SITE_OTHER): Payer: Medicare Other | Admitting: Family Medicine

## 2023-04-04 ENCOUNTER — Telehealth: Payer: Self-pay | Admitting: Family Medicine

## 2023-04-04 ENCOUNTER — Other Ambulatory Visit: Payer: Self-pay | Admitting: *Deleted

## 2023-04-04 VITALS — BP 146/80 | HR 77 | Ht 69.0 in | Wt 191.6 lb

## 2023-04-04 DIAGNOSIS — M25551 Pain in right hip: Secondary | ICD-10-CM

## 2023-04-04 DIAGNOSIS — R739 Hyperglycemia, unspecified: Secondary | ICD-10-CM

## 2023-04-04 DIAGNOSIS — I10 Essential (primary) hypertension: Secondary | ICD-10-CM

## 2023-04-04 NOTE — Telephone Encounter (Signed)
Pt would like a call back with details of his lab results. He has some questions. Please advise.

## 2023-04-04 NOTE — Progress Notes (Signed)
I, [name], CMA acting as a scribe for Austin Graham, MD.  Austin [name] is a 82 y.o. male who presents to [facility] today for hip pain. 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 He just finished up physical therapy at [facility] for back pain.  Today, pt c/o right hip pain x 4-5 months. No known injury. Pt locates pain to lateral aspect of the hip. Notes occurrence of radiating pain into the right leg. This happened just after PT. Has to stop at times while ambulating d/t tightness in the hip. Short-term relief with IBU.   Low back pain: stiffness Radiates: once Aggravates: ambulation Treatments tried: heat, PT, HEP, IBU  Pertinent review of systems: No fevers or chills  Relevant historical information: Hypertension.  History of prostate cancer.   Exam:  BP (!) 146/80   Pulse 77   Ht 5\' 9"  (1.753 m)   Wt 191 lb 9.6 oz (86.9 kg)   SpO2 96%   BMI 28.29 kg/m  General: Well Developed, well nourished, and in no acute distress.   MSK: L-spine nontender palpation midline.  Normal lumbar motion. Right hip: Normal-appearing Tender palpation mildly at greater trochanter. Decreased hip abduction and external rotation strength.    Lab and Radiology Results  X-ray images lumbar spine and right hip obtained today personally and independently interpreted  Lumbar spine: Mild multilevel DDD and anterolisthesis specifically worse at L4-5 and L5-S1.  No acute fractures are visible.  Right hip: Mild right hip DJD.  No acute fractures. Multiple metallic objects in the pelvis thought to be radioactive implant seeds for prostate.  Await formal radiology review    Assessment and Plan: 82 y.o. 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  He just completed physical therapy for back pain.  His best bet at this point is a retrial of physical therapy focused more on the hip  abductors and rotators.  Check back in about 6 weeks.  If not better consider MRI or injection.   PDMP not reviewed this encounter. Orders Placed This Encounter  Procedures   DG HIP UNILAT W OR W/O PELVIS 2-3 VIEWS RIGHT    Standing Status:   Future    Number of Occurrences:   1    Standing Expiration Date:   05/04/2023    Order Specific Question:   Reason for Exam (SYMPTOM  OR DIAGNOSIS REQUIRED)    Answer:   right hip pain    Order Specific Question:   Preferred imaging location?    Answer:   [facility]   DG Lumbar Spine 2-3 Views    Standing Status:   Future    Number of Occurrences:   1    Standing Expiration Date:   05/04/2023    Order Specific Question:   Reason for Exam (SYMPTOM  OR DIAGNOSIS REQUIRED)    Answer:   low back, right hip pain    Order Specific Question:   Preferred imaging location?    Answer:   [facility]   Ambulatory referral to Physical Therapy    Referral Priority:   Routine    Referral Type:   Physical Medicine    Referral Reason:   Specialty Services Required    Requested Specialty:   Physical Therapy    Number of Visits Requested:   1   No orders of the defined types were placed in this  encounter.    Discussed warning signs or symptoms. Please see discharge instructions. Patient expresses understanding.   The above documentation has been reviewed and is accurate and complete Austin [name], M.D.

## 2023-04-04 NOTE — Progress Notes (Signed)
Liver numbers are up a little bit.  This is probably nothing to worry about but recommend he come back in a couple of weeks to recheck.  Please place future order for CMET.  His PSA is trending up.  Can we make sure that he has urology follow-up scheduled?  Please place the referral if needed.  The rest of his labs are all stable and we can recheck in a year.

## 2023-04-04 NOTE — Patient Instructions (Signed)
Please get an Xray today before you leave  We've referred you back to [facility] for physical therapy

## 2023-04-05 NOTE — Telephone Encounter (Signed)
See results note. 

## 2023-04-06 ENCOUNTER — Encounter: Payer: Medicare Other | Admitting: Physical Therapy

## 2023-04-08 ENCOUNTER — Encounter: Payer: Medicare Other | Admitting: Physical Therapy

## 2023-04-11 NOTE — Progress Notes (Signed)
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

## 2023-04-11 NOTE — Progress Notes (Signed)
Lumbar spine x-ray shows medium arthritis changes.

## 2023-04-12 ENCOUNTER — Other Ambulatory Visit (INDEPENDENT_AMBULATORY_CARE_PROVIDER_SITE_OTHER): Payer: Medicare Other

## 2023-04-12 DIAGNOSIS — I10 Essential (primary) hypertension: Secondary | ICD-10-CM | POA: Diagnosis not present

## 2023-04-12 LAB — COMPREHENSIVE METABOLIC PANEL
ALT: 58 U/L — ABNORMAL HIGH (ref 0–53)
AST: 40 U/L — ABNORMAL HIGH (ref 0–37)
Albumin: 4 g/dL (ref 3.5–5.2)
Alkaline Phosphatase: 66 U/L (ref 39–117)
BUN: 21 mg/dL (ref 6–23)
CO2: 28 mEq/L (ref 19–32)
Calcium: 9.9 mg/dL (ref 8.4–10.5)
Chloride: 104 mEq/L (ref 96–112)
Creatinine, Ser: 0.66 mg/dL (ref 0.40–1.50)
GFR: 87.44 mL/min (ref >60.00)
Glucose, Bld: 86 mg/dL (ref 70–99)
Potassium: 3.9 mEq/L (ref 3.5–5.1)
Sodium: 140 mEq/L (ref 135–145)
Total Bilirubin: 0.6 mg/dL (ref 0.2–1.2)
Total Protein: 7.7 g/dL (ref 6.0–8.3)

## 2023-04-13 NOTE — Progress Notes (Signed)
Liver numbers are still elevated. Recommend check RUQ ultrasound to further evaluate. Please place order.  Katina Degree. Jimmey Ralph, MD 04/13/2023 9:09 AM

## 2023-04-15 ENCOUNTER — Other Ambulatory Visit: Payer: Self-pay | Admitting: *Deleted

## 2023-04-15 ENCOUNTER — Other Ambulatory Visit: Payer: Self-pay | Admitting: Internal Medicine

## 2023-04-15 DIAGNOSIS — R899 Unspecified abnormal finding in specimens from other organs, systems and tissues: Secondary | ICD-10-CM

## 2023-04-21 LAB — HM DIABETES EYE EXAM

## 2023-04-26 ENCOUNTER — Ambulatory Visit: Admission: RE | Admit: 2023-04-26 | Payer: Medicare Other | Source: Ambulatory Visit

## 2023-04-26 ENCOUNTER — Other Ambulatory Visit: Payer: Self-pay | Admitting: Family Medicine

## 2023-04-26 DIAGNOSIS — R899 Unspecified abnormal finding in specimens from other organs, systems and tissues: Secondary | ICD-10-CM

## 2023-04-26 NOTE — Progress Notes (Signed)
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  This is benign.  Do not need to do any specific treatments for this however he should continue to work on diet and exercise and we can continue to monitor his liver function with blood work routinely.

## 2023-04-27 ENCOUNTER — Ambulatory Visit: Payer: Medicare Other | Admitting: Physical Therapy

## 2023-05-02 ENCOUNTER — Other Ambulatory Visit: Payer: Self-pay | Admitting: Family Medicine

## 2023-05-04 ENCOUNTER — Encounter: Payer: Self-pay | Admitting: Physical Therapy

## 2023-05-04 ENCOUNTER — Ambulatory Visit (INDEPENDENT_AMBULATORY_CARE_PROVIDER_SITE_OTHER): Payer: Medicare Other | Admitting: Physical Therapy

## 2023-05-04 DIAGNOSIS — M25551 Pain in right hip: Secondary | ICD-10-CM | POA: Diagnosis not present

## 2023-05-04 DIAGNOSIS — M6281 Muscle weakness (generalized): Secondary | ICD-10-CM | POA: Diagnosis not present

## 2023-05-04 NOTE — Therapy (Signed)
OUTPATIENT PHYSICAL THERAPY TREATMENT NOTE/ Re-Cert    Patient Name: Austin [name] MRN: [id number] DOB:Feb 06, 1941, 82 y.o., male Today's Date: 05/04/2023  PCP: Ardith Dark, MD   REFERRING PROVIDER: Clementeen Graham    END OF SESSION:   PT End of Session - 05/04/23 1305     Visit Number 17    Number of Visits 24    Date for PT Re-Evaluation 06/29/23    Authorization Type medicare  PN done at visit 11, recert at visit 17.    PT Start Time 1305    PT Stop Time 1335    PT Time Calculation (min) 30 min    Activity Tolerance Patient tolerated treatment well    Behavior During Therapy WFL for tasks assessed/performed             Past Medical History:  Diagnosis Date   Asthma    Depression    Hypertension    Prostate cancer (HCC)    Urinary incontinence    Past Surgical History:  Procedure Laterality Date   GALLBLADDER SURGERY  1997   REPLACEMENT TOTAL KNEE BILATERAL  2001   2007   surgery replacement Bilateral 2005   Patient Active Problem List   Diagnosis Date Noted   Viral URI 12/10/2022   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   Seasonal allergic rhinitis 08/31/2021   Vitamin B12 deficiency 02/10/2021   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   Chronic low back pain 02/11/2020   Insomnia 02/11/2020   Hyperglycemia 11/08/2019   Vitamin D deficiency 11/08/2019   Essential hypertension 11/08/2019   Gastroesophageal reflux disease 11/08/2019   Depression, major, single episode, 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   Cervical disc disease 11/08/2019   Asthma, persistent controlled 11/08/2019   Prostate cancer (HCC) 11/08/2019   Status post cataract extraction 11/08/2019   Dyslipidemia 11/08/2019    PCP: Ardith Dark, MD  REFERRING PROVIDER: Clementeen Graham    THERAPY DIAG:  Muscle weakness (generalized)  Pain in right hip  REFERRING DIAG: M50.90 (ICD-10-CM) - Cervical disc disease    Rationale for Evaluation and Treatment: Rehabilitation    ONSET DATE: a couple years   SUBJECTIVE:                                                                                                                                                                                                          SUBJECTIVE STATEMENT: 05/04/2023 Re-Eval  Pt with new script from last MD appt for R hip pain. Pt has been seen for 16 previous PT visits with  focus on Low back pain. He is doing very well with improved compliance with HEP, and is going to senior center for regular exercise. He continues to have limitations for physical activity due to breathing and asthma. He also has stiffness in low back that he is managing well, and is feeling some improvements with this from previous PT bout.  He has increased pain in R hip, states pain with sleeping on it, (sleeps on R side), as well as feeling stiff in the morning. Feels soreness with trying to strengthen hip with previous HEP.  We did work quite a bit on hip mobility previously in PT.    01/12/23:  Eval  Right sided neck pain reported and in right low back. States that the neck pain has been going on for 1-2 years with no change over the last 2 years. States he talked to MD about pain who suggested PT.  States neck pain is like a clunk and when he does his neck exercises he can get like a clunking feeling when he moves it wrong. States certain movements make his neck pain worse.  Low back pain has been going on for a long time and he can't walk with his wife without his back pain and asthma. Reports that walking and standing makes his pain in his back worse. Pain is described as sharp at times but not sure if that goes into legs. States pain usually on the right.  States he goes to the senior center and takes tai chi and has no pain with this.  Hand dominance: Right   PERTINENT HISTORY:  Asthma, HTN, B TKR, B shoulder surgery (total shoulder per pt <5 years ago), diaphragmatic hernia repaired    PAIN:  Are you having  pain? Yes R hip,  4-5/10, pain with sleeping on it, trying to exercise.  Relieving: none stated.    PRECAUTIONS: None   WEIGHT BEARING RESTRICTIONS: No   FALLS:  Has patient fallen in last 6 months? No     OCCUPATION: not working   PLOF: Independent   PATIENT GOALS: to have less pain   OBJECTIVE: updated 05/04/23   DIAGNOSTIC FINDINGS:  None at this time   COGNITION: Overall cognitive status: Within functional limits for tasks assessed   SENSATION: Not tested   POSTURE: rounded shoulders, forward head, and flexed trunk                    Lumber AROM:    01/12/2023   AROM 02/23/23 05/04/23   Flexion  50% limited Hands to toes (decreased motion from back, more from hamstrings )  Hands to toes (decreased motion from back, more from hamstrings )   Extension  90% limited 80 % limited      R ROT        L ROT        R SB  75% limited 50 % limited  25 % limited  L SB 75% limited 50 % limited   25 % limited                        * Pain              (Blank rows = not tested)  Hip ROM:  Flexion: WFL IR/ER: mild/mod limitation on L,  R WFL  Ext: mod limitation bilaterally        LE Measurements  Lower Extremity Right 01/12/2023 Left 01/12/2023 R 02/23/23 L 02/23/23 R 7/24 L 7/24    A/PROM MMT A/PROM MMT      Hip Flexion WFL 4 WFL 4- 4+ 4- 4+ 4-  Hip Extension              Hip Abduction           3- 4  Hip Adduction              Hip Internal rotation 30   5        Hip External rotation 45   20        Knee Flexion   4   4 4+ 4+    Knee Extension   4-   4- 4+ 4+    Ankle Dorsiflexion              Ankle Plantarflexion              Ankle Inversion              Ankle Eversion               (Blank rows = not tested) * pain   PALPATION:  tenderness to palpate R gr trochanter   FUNCTIONAL TESTS:  Holds breath with transitional movements- abdominal hernia present   TODAY'S TREATMENT:                                                                                                                               DATE:    05/04/2023 Therapeutic Exercise: Aerobic: Supine:  hip ER ROM  x 10;    bridging x 10 ;   S/L:   Clams 2 x 10   (trial for hip abd: pain)  Seated:  standing: Neuromuscular Re-education:  Manual Therapy:     PATIENT EDUCATION:  Education details: on updated HEP and PT plan.  Person educated: Patient Education method: Explanation, Demonstration, and Handouts Education comprehension: verbalized understanding     HOME EXERCISE PROGRAM: [id number]   ASSESSMENT:   CLINICAL IMPRESSION:  05/04/2023 Pt presents back to PT, with increased pain in R hip. He has been seen for several visits recently, with focus on lumbar pain and stiffness. He does have weakness in R hip abduction , and pain to palpate greater trochanter, with symptoms consistent with bursitis. He has HEP established , already working on hip and lumbar mobility, and is doing well with management of this.  He has increased pain with activity and attempts for strengthening for hip at home. Pt will require continuation of skilled PT , to improve strength and pain in R hip, and to allow pain free strengthening for HEP in future.  Pt to be seen 1x/wk for 4-6 weeks. KX modifier will be applied after visit 18.   Eval: Patient presents to physical therapy with complaints of neck and low back pain that has been present for a couple of years.  Patient limited in range of motion and strength that is contributing to current level of function.  Educated patient in benefits of physical therapy as well as basic home exercise program.  Answered all questions send patient would greatly benefit from skilled physical therapy at this time to improve overall function and quality of life.   OBJECTIVE IMPAIRMENTS: Abnormal gait, decreased activity tolerance, difficulty walking, decreased ROM, decreased strength, improper body mechanics, postural dysfunction, and pain.    ACTIVITY LIMITATIONS:  lifting, bending, standing, sleeping, transfers, and locomotion level   PARTICIPATION LIMITATIONS: meal prep, cleaning, and community activity   PERSONAL FACTORS: Fitness and 1-2 comorbidities: abdominal hernia, B TKA  are also affecting patient's functional outcome.    REHAB POTENTIAL: Good   CLINICAL DECISION MAKING: Stable/uncomplicated   EVALUATION COMPLEXITY: Low     GOALS: Goals reviewed with patient? yes   SHORT TERM GOALS: Target date: 02/23/2023  Patient will be independent in self management strategies to improve quality of life and functional outcomes. Baseline: New Program Goal status: MET   2.  Patient will report at least 50% improvement in overall symptoms and/or function to demonstrate improved functional mobility Baseline: 0% better Goal status: partially met   3.  Patient will be able to demonstrate pain-free neck range of motion in all directions Baseline: Painful Goal status: MET       LONG TERM GOALS: Target date: 06/29/2023   Patient will report at least 75% improvement in overall symptoms and/or function to demonstrate improved functional mobility-  for R hip  Goal status: new/updated    2.  Patient will demonstrate improved lumbar range of motion by least 25% in all directions Goal status: MET   3.  Patient will report performing daily mobility routine to reduce stiffness in neck and back Goal status: MET  4. Pt to demo ability for pain free strengthening for HEP, for hip abd and ER motions- to improve strength and pain in hip.   Goal status: New        PLAN:   PT FREQUENCY: 1-2x/week    PT DURATION: 8 weeks   PLANNED INTERVENTIONS: Therapeutic exercises, Therapeutic activity, Neuromuscular re-education, Balance training, Gait training, Patient/Family education, Self Care, Joint mobilization, Joint manipulation, Vestibular training, Canalith repositioning, Orthotic/Fit training, Aquatic Therapy, Dry Needling, Electrical stimulation, Spinal  manipulation, Spinal mobilization, Cryotherapy, Moist heat, Traction, Ultrasound, and Re-evaluation    PLAN FOR NEXT SESSION:  focus on hip abd strength and ability for pain free HEP/strengthening. Manual as needed for R hip pain   Sedalia Muta, PT, DPT 1:44 PM  05/04/23

## 2023-05-10 ENCOUNTER — Encounter: Payer: Self-pay | Admitting: Physical Therapy

## 2023-05-10 ENCOUNTER — Ambulatory Visit (INDEPENDENT_AMBULATORY_CARE_PROVIDER_SITE_OTHER): Payer: Medicare Other | Admitting: Physical Therapy

## 2023-05-10 DIAGNOSIS — M542 Cervicalgia: Secondary | ICD-10-CM | POA: Diagnosis not present

## 2023-05-10 DIAGNOSIS — M25551 Pain in right hip: Secondary | ICD-10-CM | POA: Diagnosis not present

## 2023-05-10 DIAGNOSIS — M6281 Muscle weakness (generalized): Secondary | ICD-10-CM | POA: Diagnosis not present

## 2023-05-10 NOTE — Therapy (Signed)
OUTPATIENT PHYSICAL THERAPY TREATMENT NOTE/ Re-Cert    Patient Name: Austin [name] MRN: [id number] DOB:07-Aug-1941, 82 y.o., male Today's Date: 05/10/2023  PCP: Ardith Dark, MD   REFERRING PROVIDER: Clementeen Graham    END OF SESSION:   PT End of Session - 05/10/23 1426     Visit Number 18    Number of Visits 24    Date for PT Re-Evaluation 06/29/23    Authorization Type medicare  PN done at visit 11, recert at visit 17.    PT Start Time 1434    PT Stop Time 1512    PT Time Calculation (min) 38 min    Activity Tolerance Patient tolerated treatment well    Behavior During Therapy WFL for tasks assessed/performed             Past Medical History:  Diagnosis Date   Asthma    Depression    Hypertension    Prostate cancer (HCC)    Urinary incontinence    Past Surgical History:  Procedure Laterality Date   GALLBLADDER SURGERY  1997   REPLACEMENT TOTAL KNEE BILATERAL  2001   2007   surgery replacement Bilateral 2005   Patient Active Problem List   Diagnosis Date Noted   Viral URI 12/10/2022   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   Seasonal allergic rhinitis 08/31/2021   Vitamin B12 deficiency 02/10/2021   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   Chronic low back pain 02/11/2020   Insomnia 02/11/2020   Hyperglycemia 11/08/2019   Vitamin D deficiency 11/08/2019   Essential hypertension 11/08/2019   Gastroesophageal reflux disease 11/08/2019   Depression, major, single episode, 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   Cervical disc disease 11/08/2019   Asthma, persistent controlled 11/08/2019   Prostate cancer (HCC) 11/08/2019   Status post cataract extraction 11/08/2019   Dyslipidemia 11/08/2019    PCP: Ardith Dark, MD  REFERRING PROVIDER: Clementeen Graham    THERAPY DIAG:  Muscle weakness (generalized)  Pain in right hip  Cervicalgia  REFERRING DIAG: M50.90 (ICD-10-CM) - Cervical disc disease    Rationale for Evaluation and Treatment:  Rehabilitation   ONSET DATE: a couple years   SUBJECTIVE:                                                                                                                                                                                                          SUBJECTIVE STATEMENT: 05/10/2023  States he is feeling OK. The side lying exercise was challenging for him     He has increased pain  in R hip, states pain with sleeping on it, (sleeps on R side), as well as feeling stiff in the morning. Feels soreness with trying to strengthen hip with previous HEP.  We did work quite a bit on hip mobility previously in PT.    01/12/23:  Eval  Right sided neck pain reported and in right low back. States that the neck pain has been going on for 1-2 years with no change over the last 2 years. States he talked to MD about pain who suggested PT.  States neck pain is like a clunk and when he does his neck exercises he can get like a clunking feeling when he moves it wrong. States certain movements make his neck pain worse.  Low back pain has been going on for a long time and he can't walk with his wife without his back pain and asthma. Reports that walking and standing makes his pain in his back worse. Pain is described as sharp at times but not sure if that goes into legs. States pain usually on the right.  States he goes to the senior center and takes tai chi and has no pain with this.  Hand dominance: Right   PERTINENT HISTORY:  Asthma, HTN, B TKR, B shoulder surgery (total shoulder per pt <5 years ago), diaphragmatic hernia repaired    PAIN:  Are you having pain? Yes R hip,  2-3/10, pain with sleeping on it, trying to exercise.  Relieving: none stated.    PRECAUTIONS: None   WEIGHT BEARING RESTRICTIONS: No   FALLS:  Has patient fallen in last 6 months? No     OCCUPATION: not working   PLOF: Independent   PATIENT GOALS: to have less pain   OBJECTIVE: updated 05/04/23   DIAGNOSTIC FINDINGS:   None at this time   COGNITION: Overall cognitive status: Within functional limits for tasks assessed   SENSATION: Not tested   POSTURE: rounded shoulders, forward head, and flexed trunk                    Lumber AROM:    01/12/2023   AROM 02/23/23 05/04/23   Flexion  50% limited Hands to toes (decreased motion from back, more from hamstrings )  Hands to toes (decreased motion from back, more from hamstrings )   Extension  90% limited 80 % limited      R ROT        L ROT        R SB  75% limited 50 % limited  25 % limited  L SB 75% limited 50 % limited   25 % limited                        * Pain              (Blank rows = not tested)  Hip ROM:  Flexion: WFL IR/ER: mild/mod limitation on L,  R WFL  Ext: mod limitation bilaterally        LE Measurements           Lower Extremity Right 01/12/2023 Left 01/12/2023 R 02/23/23 L 02/23/23 R 7/24 L 7/24    A/PROM MMT A/PROM MMT      Hip Flexion WFL 4 WFL 4- 4+ 4- 4+ 4-  Hip Extension              Hip Abduction           3-  4  Hip Adduction              Hip Internal rotation 30   5        Hip External rotation 45   20        Knee Flexion   4   4 4+ 4+    Knee Extension   4-   4- 4+ 4+    Ankle Dorsiflexion              Ankle Plantarflexion              Ankle Inversion              Ankle Eversion               (Blank rows = not tested) * pain   PALPATION:  tenderness to palpate R gr trochanter   FUNCTIONAL TESTS:  Holds breath with transitional movements- abdominal hernia present   TODAY'S TREATMENT:                                                                                                                              DATE:    05/10/2023 Therapeutic Exercise: Aerobic: bike L1 4 minutes Supine:  hip abd iso into belt 5" holds 2 minutes total,    bridging 3x5;  piriformis stretch x3 30" holds B, hamstring stretch x5 10" holds B S/L:   Clams 2 x 10   B Seated:  standing: Neuromuscular Re-education:  Manual  Therapy:     PATIENT EDUCATION:  Education details: on HEP Person educated: Patient Education method: Programmer, multimedia, Facilities manager, and Handouts Education comprehension: verbalized understanding     HOME EXERCISE PROGRAM: [id number]   ASSESSMENT:   CLINICAL IMPRESSION:  05/10/2023 Cues to breath throughout session secondary to patient having tendency to hold breath and causing hernia to bulge. This improved with verbal cues. No increase in pain noted during session. Added new exercises to HEP. Overall tolerated session well and will continue with current POC as tolerated.   Eval: Patient presents to physical therapy with complaints of neck and low back pain that has been present for a couple of years.  Patient limited in range of motion and strength that is contributing to current level of function.  Educated patient in benefits of physical therapy as well as basic home exercise program.  Answered all questions send patient would greatly benefit from skilled physical therapy at this time to improve overall function and quality of life.   OBJECTIVE IMPAIRMENTS: Abnormal gait, decreased activity tolerance, difficulty walking, decreased ROM, decreased strength, improper body mechanics, postural dysfunction, and pain.    ACTIVITY LIMITATIONS: lifting, bending, standing, sleeping, transfers, and locomotion level   PARTICIPATION LIMITATIONS: meal prep, cleaning, and community activity   PERSONAL FACTORS: Fitness and 1-2 comorbidities: abdominal hernia, B TKA  are also affecting patient's functional outcome.    REHAB POTENTIAL: Good   CLINICAL DECISION MAKING: Stable/uncomplicated   EVALUATION  COMPLEXITY: Low     GOALS: Goals reviewed with patient? yes   SHORT TERM GOALS: Target date: 02/23/2023  Patient will be independent in self management strategies to improve quality of life and functional outcomes. Baseline: New Program Goal status: MET   2.  Patient will report at least 50%  improvement in overall symptoms and/or function to demonstrate improved functional mobility Baseline: 0% better Goal status: partially met   3.  Patient will be able to demonstrate pain-free neck range of motion in all directions Baseline: Painful Goal status: MET       LONG TERM GOALS: Target date: 06/29/2023   Patient will report at least 75% improvement in overall symptoms and/or function to demonstrate improved functional mobility-  for R hip  Goal status: new/updated    2.  Patient will demonstrate improved lumbar range of motion by least 25% in all directions Goal status: MET   3.  Patient will report performing daily mobility routine to reduce stiffness in neck and back Goal status: MET  4. Pt to demo ability for pain free strengthening for HEP, for hip abd and ER motions- to improve strength and pain in hip.   Goal status: New        PLAN:   PT FREQUENCY: 1-2x/week    PT DURATION: 8 weeks   PLANNED INTERVENTIONS: Therapeutic exercises, Therapeutic activity, Neuromuscular re-education, Balance training, Gait training, Patient/Family education, Self Care, Joint mobilization, Joint manipulation, Vestibular training, Canalith repositioning, Orthotic/Fit training, Aquatic Therapy, Dry Needling, Electrical stimulation, Spinal manipulation, Spinal mobilization, Cryotherapy, Moist heat, Traction, Ultrasound, and Re-evaluation    PLAN FOR NEXT SESSION:  focus on hip abd strength and ability for pain free HEP/strengthening. Manual as needed for R hip pain   KX modifier added - patient would greatly benefit from skilled PT to work on improving overall quality of life and overall function.  3:16 PM, 05/10/23 Tereasa Coop, DPT Physical Therapy with [facility]

## 2023-05-16 ENCOUNTER — Ambulatory Visit (INDEPENDENT_AMBULATORY_CARE_PROVIDER_SITE_OTHER): Payer: Medicare Other | Admitting: Physical Therapy

## 2023-05-16 ENCOUNTER — Encounter: Payer: Self-pay | Admitting: Family Medicine

## 2023-05-16 ENCOUNTER — Encounter: Payer: Self-pay | Admitting: Physical Therapy

## 2023-05-16 ENCOUNTER — Ambulatory Visit (INDEPENDENT_AMBULATORY_CARE_PROVIDER_SITE_OTHER): Payer: Medicare Other | Admitting: Family Medicine

## 2023-05-16 VITALS — BP 160/90 | HR 73 | Ht 69.0 in | Wt 191.0 lb

## 2023-05-16 DIAGNOSIS — M25551 Pain in right hip: Secondary | ICD-10-CM

## 2023-05-16 DIAGNOSIS — M6281 Muscle weakness (generalized): Secondary | ICD-10-CM | POA: Diagnosis not present

## 2023-05-16 NOTE — Therapy (Unsigned)
OUTPATIENT PHYSICAL THERAPY TREATMENT NOTE   Patient Name: Austin [name] MRN: [id number] DOB:14-Dec-1940, 82 y.o., male Today's Date: 05/16/2023  PCP: Ardith Dark, MD   REFERRING PROVIDER: Clementeen Graham    END OF SESSION:   PT End of Session - 05/16/23 1558     Visit Number 19    Number of Visits 24    Date for PT Re-Evaluation 06/29/23    Authorization Type medicare  PN done at visit 11, recert at visit 17.    PT Start Time 1603    PT Stop Time 1645    PT Time Calculation (min) 42 min    Activity Tolerance Patient tolerated treatment well    Behavior During Therapy WFL for tasks assessed/performed             Past Medical History:  Diagnosis Date   Asthma    Depression    Hypertension    Prostate cancer (HCC)    Urinary incontinence    Past Surgical History:  Procedure Laterality Date   GALLBLADDER SURGERY  1997   REPLACEMENT TOTAL KNEE BILATERAL  2001   2007   surgery replacement Bilateral 2005   Patient Active Problem List   Diagnosis Date Noted   Viral URI 12/10/2022   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   Seasonal allergic rhinitis 08/31/2021   Vitamin B12 deficiency 02/10/2021   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   Chronic low back pain 02/11/2020   Insomnia 02/11/2020   Hyperglycemia 11/08/2019   Vitamin D deficiency 11/08/2019   Essential hypertension 11/08/2019   Gastroesophageal reflux disease 11/08/2019   Depression, major, single episode, 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   Cervical disc disease 11/08/2019   Asthma, persistent controlled 11/08/2019   Prostate cancer (HCC) 11/08/2019   Status post cataract extraction 11/08/2019   Dyslipidemia 11/08/2019    PCP: Ardith Dark, MD  REFERRING PROVIDER: Clementeen Graham    THERAPY DIAG:  Muscle weakness (generalized)  Pain in right hip  REFERRING DIAG: M50.90 (ICD-10-CM) - Cervical disc disease    Rationale for Evaluation and Treatment: Rehabilitation   ONSET  DATE: a couple years   SUBJECTIVE:                                                                                                                                                                                                          SUBJECTIVE STATEMENT: 05/16/2023  States he is feeling OK. Has been doing HEP and going to senior center. He saw sports med today, is going to hold off on  getting hip injection and see how the strengthening/ PT goes.     He has increased pain in R hip, states pain with sleeping on it, (sleeps on R side), as well as feeling stiff in the morning. Feels soreness with trying to strengthen hip with previous HEP.  We did work quite a bit on hip mobility previously in PT.    01/12/23:  Eval  Right sided neck pain reported and in right low back. States that the neck pain has been going on for 1-2 years with no change over the last 2 years. States he talked to MD about pain who suggested PT.  States neck pain is like a clunk and when he does his neck exercises he can get like a clunking feeling when he moves it wrong. States certain movements make his neck pain worse.  Low back pain has been going on for a long time and he can't walk with his wife without his back pain and asthma. Reports that walking and standing makes his pain in his back worse. Pain is described as sharp at times but not sure if that goes into legs. States pain usually on the right.  States he goes to the senior center and takes tai chi and has no pain with this.  Hand dominance: Right   PERTINENT HISTORY:  Asthma, HTN, B TKR, B shoulder surgery (total shoulder per pt <5 years ago), diaphragmatic hernia repaired    PAIN:  Are you having pain? Yes R hip,  2-3/10, pain with sleeping on it, trying to exercise.  Relieving: none stated.    PRECAUTIONS: None   WEIGHT BEARING RESTRICTIONS: No   FALLS:  Has patient fallen in last 6 months? No     OCCUPATION: not working   PLOF: Independent   PATIENT  GOALS: to have less pain   OBJECTIVE: updated 05/04/23   DIAGNOSTIC FINDINGS:  None at this time   COGNITION: Overall cognitive status: Within functional limits for tasks assessed   SENSATION: Not tested   POSTURE: rounded shoulders, forward head, and flexed trunk                    Lumber AROM:    01/12/2023   AROM 02/23/23 05/04/23   Flexion  50% limited Hands to toes (decreased motion from back, more from hamstrings )  Hands to toes (decreased motion from back, more from hamstrings )   Extension  90% limited 80 % limited      R ROT        L ROT        R SB  75% limited 50 % limited  25 % limited  L SB 75% limited 50 % limited   25 % limited                        * Pain              (Blank rows = not tested)  Hip ROM:  Flexion: WFL IR/ER: mild/mod limitation on L,  R WFL  Ext: mod limitation bilaterally        LE Measurements           Lower Extremity Right 01/12/2023 Left 01/12/2023 R 02/23/23 L 02/23/23 R 7/24 L 7/24    A/PROM MMT A/PROM MMT      Hip Flexion WFL 4 WFL 4- 4+ 4- 4+ 4-  Hip Extension  Hip Abduction           3- 4  Hip Adduction              Hip Internal rotation 30   5        Hip External rotation 45   20        Knee Flexion   4   4 4+ 4+    Knee Extension   4-   4- 4+ 4+    Ankle Dorsiflexion              Ankle Plantarflexion              Ankle Inversion              Ankle Eversion               (Blank rows = not tested) * pain   PALPATION:  tenderness to palpate R gr trochanter   FUNCTIONAL TESTS:  Holds breath with transitional movements- abdominal hernia present   TODAY'S TREATMENT:                                                                                                                              DATE:    05/16/2023 Therapeutic Exercise: Aerobic: bike L1 x 5 minutes Supine:  hip ER fallouts x 10, 5 sec holds;  hip abd iso into belt 5" holds 2 minutes total,    bridging 2 x 10;  S/L:   Clams 2 x 10   B;   hip abd  3 x 5  on R;  Seated:  standing: Neuromuscular Re-education:  Manual Therapy:  STM/tennis ball to R glute /rotators, in s/l.     PATIENT EDUCATION:  Education details: on HEP Person educated: Patient Education method: Programmer, multimedia, Demonstration, and Handouts Education comprehension: verbalized understanding     HOME EXERCISE PROGRAM: [id number]   ASSESSMENT:   CLINICAL IMPRESSION:  05/16/2023 Pt with mild tenderness in glute/rotators today, but improved tenderness here and in gr troch from previous weeks. He has improving ability for ther ex for clams and rotation motion. Still very challenged with hip abd in s/l due to weakness. Pt to benefit from continued focus on hip pain and strength. .  Eval: Patient presents to physical therapy with complaints of neck and low back pain that has been present for a couple of years.  Patient limited in range of motion and strength that is contributing to current level of function.  Educated patient in benefits of physical therapy as well as basic home exercise program.  Answered all questions send patient would greatly benefit from skilled physical therapy at this time to improve overall function and quality of life.   OBJECTIVE IMPAIRMENTS: Abnormal gait, decreased activity tolerance, difficulty walking, decreased ROM, decreased strength, improper body mechanics, postural dysfunction, and pain.    ACTIVITY LIMITATIONS: lifting, bending, standing, sleeping, transfers, and locomotion level   PARTICIPATION LIMITATIONS: meal prep,  cleaning, and community activity   PERSONAL FACTORS: Fitness and 1-2 comorbidities: abdominal hernia, B TKA  are also affecting patient's functional outcome.    REHAB POTENTIAL: Good   CLINICAL DECISION MAKING: Stable/uncomplicated   EVALUATION COMPLEXITY: Low     GOALS: Goals reviewed with patient? yes   SHORT TERM GOALS: Target date: 02/23/2023  Patient will be independent in self management strategies to improve  quality of life and functional outcomes. Baseline: New Program Goal status: MET   2.  Patient will report at least 50% improvement in overall symptoms and/or function to demonstrate improved functional mobility Baseline: 0% better Goal status: partially met   3.  Patient will be able to demonstrate pain-free neck range of motion in all directions Baseline: Painful Goal status: MET       LONG TERM GOALS: Target date: 06/29/2023   Patient will report at least 75% improvement in overall symptoms and/or function to demonstrate improved functional mobility-  for R hip  Goal status: new/updated    2.  Patient will demonstrate improved lumbar range of motion by least 25% in all directions Goal status: MET   3.  Patient will report performing daily mobility routine to reduce stiffness in neck and back Goal status: MET  4. Pt to demo ability for pain free strengthening for HEP, for hip abd and ER motions- to improve strength and pain in hip.   Goal status: New        PLAN:   PT FREQUENCY: 1-2x/week    PT DURATION: 8 weeks   PLANNED INTERVENTIONS: Therapeutic exercises, Therapeutic activity, Neuromuscular re-education, Balance training, Gait training, Patient/Family education, Self Care, Joint mobilization, Joint manipulation, Vestibular training, Canalith repositioning, Orthotic/Fit training, Aquatic Therapy, Dry Needling, Electrical stimulation, Spinal manipulation, Spinal mobilization, Cryotherapy, Moist heat, Traction, Ultrasound, and Re-evaluation    PLAN FOR NEXT SESSION:  focus on hip abd strength and ability for pain free HEP/strengthening. Manual as needed for R hip pain    KX modifier added - patient would greatly benefit from skilled PT to work on improving overall quality of life and overall function.  Sedalia Muta, PT, DPT 9:08 AM  05/17/23

## 2023-05-16 NOTE — Progress Notes (Unsigned)
   I, [name], CMA acting as a scribe for Austin Graham, MD.  Austin [name] is a 82 y.o. male who presents to [facility] at [facility] today for f/u R hip pain. Pt was last seen by Dr. [name] on 04/04/23 and his PT ordered was adjusted to focus more on his hip aBd/rotators, of which he's completed 2 visits.   Today, pt reports improvement of hip sx with PT and HEP. Sx improve with movement and worsen when sedentary. Will have some pain and tightness when being more sedentary. Currently no meds for pain. Denies new or worsening sx.   He would like to increase his activity including longer walking.  Dx imaging: 04/04/23 L-spine & R hip XR  Pertinent review of systems: No fevers or chills  Relevant historical information: Asthma/COPD History of prostate cancer  Exam:  BP (!) 160/90   Pulse 73   Ht 5\' 9"  (1.753 m)   Wt 191 lb (86.6 kg)   SpO2 96%   BMI 28.21 kg/m  General: Well Developed, well nourished, and in no acute distress.   MSK: Normal hip motion.  Normal gait.     Assessment and Plan: 82 y.o. male with right lateral hip pain.  Improved with home exercise program.  Advance activity as tolerated including brisk walking.  If not good enough next step would be PT or injection.  Check back as needed.   PDMP not reviewed this encounter. No orders of the defined types were placed in this encounter.  No orders of the defined types were placed in this encounter.    Discussed warning signs or symptoms. Please see discharge instructions. Patient expresses understanding.   The above documentation has been reviewed and is accurate and complete Austin [name], M.D.

## 2023-05-16 NOTE — Patient Instructions (Signed)
Thank you for coming in today.   I can do an injection any time.   OK to advance exercise including walking.   Formal PT could possibly help.   Let me know when you need anything.

## 2023-05-25 ENCOUNTER — Ambulatory Visit (INDEPENDENT_AMBULATORY_CARE_PROVIDER_SITE_OTHER): Payer: Medicare Other | Admitting: Physical Therapy

## 2023-05-25 ENCOUNTER — Encounter: Payer: Self-pay | Admitting: Physical Therapy

## 2023-05-25 DIAGNOSIS — M25551 Pain in right hip: Secondary | ICD-10-CM | POA: Diagnosis not present

## 2023-05-25 DIAGNOSIS — M6281 Muscle weakness (generalized): Secondary | ICD-10-CM | POA: Diagnosis not present

## 2023-05-25 NOTE — Therapy (Signed)
OUTPATIENT PHYSICAL THERAPY TREATMENT NOTE   Patient Name: Austin [name] MRN: [id number] DOB:1941/07/20, 82 y.o., male Today's Date: 05/25/2023  PCP: Ardith Dark, MD   REFERRING PROVIDER: Clementeen Graham    END OF SESSION:   PT End of Session - 05/25/23 1517     Visit Number 20    Number of Visits 24    Date for PT Re-Evaluation 06/29/23    Authorization Type medicare  PN done at visit 11, recert at visit 17.    Authorization Time Period Use KX    PT Start Time 1517    PT Stop Time 1555    PT Time Calculation (min) 38 min    Activity Tolerance Patient tolerated treatment well    Behavior During Therapy WFL for tasks assessed/performed             Past Medical History:  Diagnosis Date   Asthma    Depression    Hypertension    Prostate cancer (HCC)    Urinary incontinence    Past Surgical History:  Procedure Laterality Date   GALLBLADDER SURGERY  1997   REPLACEMENT TOTAL KNEE BILATERAL  2001   2007   surgery replacement Bilateral 2005   Patient Active Problem List   Diagnosis Date Noted   Viral URI 12/10/2022   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   Seasonal allergic rhinitis 08/31/2021   Vitamin B12 deficiency 02/10/2021   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   Chronic low back pain 02/11/2020   Insomnia 02/11/2020   Hyperglycemia 11/08/2019   Vitamin D deficiency 11/08/2019   Essential hypertension 11/08/2019   Gastroesophageal reflux disease 11/08/2019   Depression, major, single episode, 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   Cervical disc disease 11/08/2019   Asthma, persistent controlled 11/08/2019   Prostate cancer (HCC) 11/08/2019   Status post cataract extraction 11/08/2019   Dyslipidemia 11/08/2019    PCP: Ardith Dark, MD  REFERRING PROVIDER: Clementeen Graham    THERAPY DIAG:  Muscle weakness (generalized)  Pain in right hip  REFERRING DIAG: M50.90 (ICD-10-CM) - Cervical disc disease    Rationale for Evaluation and  Treatment: Rehabilitation   ONSET DATE: a couple years   SUBJECTIVE:                                                                                                                                                                                                          SUBJECTIVE STATEMENT: 05/25/2023  States he went to the MD and he is putting his injection off. States he is doing  his exercises and it seems to be helping.     He has increased pain in R hip, states pain with sleeping on it, (sleeps on R side), as well as feeling stiff in the morning. Feels soreness with trying to strengthen hip with previous HEP.  We did work quite a bit on hip mobility previously in PT.    01/12/23:  Eval  Right sided neck pain reported and in right low back. States that the neck pain has been going on for 1-2 years with no change over the last 2 years. States he talked to MD about pain who suggested PT.  States neck pain is like a clunk and when he does his neck exercises he can get like a clunking feeling when he moves it wrong. States certain movements make his neck pain worse.  Low back pain has been going on for a long time and he can't walk with his wife without his back pain and asthma. Reports that walking and standing makes his pain in his back worse. Pain is described as sharp at times but not sure if that goes into legs. States pain usually on the right.  States he goes to the senior center and takes tai chi and has no pain with this.  Hand dominance: Right   PERTINENT HISTORY:  Asthma, HTN, B TKR, B shoulder surgery (total shoulder per pt <5 years ago), diaphragmatic hernia repaired    PAIN:  Are you having pain? Yes R hip,  3/10, pain with sleeping on it, trying to exercise.  Relieving: none stated.    PRECAUTIONS: None   WEIGHT BEARING RESTRICTIONS: No   FALLS:  Has patient fallen in last 6 months? No     OCCUPATION: not working   PLOF: Independent   PATIENT GOALS: to have less  pain   OBJECTIVE: updated 05/04/23   DIAGNOSTIC FINDINGS:  None at this time   COGNITION: Overall cognitive status: Within functional limits for tasks assessed   SENSATION: Not tested   POSTURE: rounded shoulders, forward head, and flexed trunk                    Lumber AROM:    01/12/2023   AROM 02/23/23 05/04/23   Flexion  50% limited Hands to toes (decreased motion from back, more from hamstrings )  Hands to toes (decreased motion from back, more from hamstrings )   Extension  90% limited 80 % limited      R ROT        L ROT        R SB  75% limited 50 % limited  25 % limited  L SB 75% limited 50 % limited   25 % limited                        * Pain              (Blank rows = not tested)  Hip ROM:  Flexion: WFL IR/ER: mild/mod limitation on L,  R WFL  Ext: mod limitation bilaterally        LE Measurements           Lower Extremity Right 01/12/2023 Left 01/12/2023 R 02/23/23 L 02/23/23 R 7/24 L 7/24    A/PROM MMT A/PROM MMT      Hip Flexion WFL 4 WFL 4- 4+ 4- 4+ 4-  Hip Extension  Hip Abduction           3- 4  Hip Adduction              Hip Internal rotation 30   5        Hip External rotation 45   20        Knee Flexion   4   4 4+ 4+    Knee Extension   4-   4- 4+ 4+    Ankle Dorsiflexion              Ankle Plantarflexion              Ankle Inversion              Ankle Eversion               (Blank rows = not tested) * pain   PALPATION:  tenderness to palpate R gr trochanter   FUNCTIONAL TESTS:  Holds breath with transitional movements- abdominal hernia present   TODAY'S TREATMENT:                                                                                                                              DATE:    05/25/2023 Therapeutic Exercise: Aerobic:   Supine: bent knee fall outs 2 minutes piriformis stretch x3 30" holds B  S/L:   ;  Seated: hip IR 3x10 B, hip add iso 5" holds 2 minutes, hip ER iso butterflies 3x10 5" holds, chin  tucks x20 5" holds, cervical ROT x25 5" holds, STS with wide stance - elevated height (sumo squat) 2x10  standing: self mobilization to glutes - tennis ball - 8 minutes Neuromuscular Re-education:  Manual Therapy:     PATIENT EDUCATION:  Education details: on HEP, on previous neck exercises for neck rehab Person educated: Patient Education method: Explanation, Demonstration, and Handouts Education comprehension: verbalized understanding     HOME EXERCISE PROGRAM: [id number]   ASSESSMENT:   CLINICAL IMPRESSION:  05/25/2023 Patient inquired about chronic neck pain and what to do for his neck crunching. Reviewed previous exercises for neck and discussed typical "crunching" in neck.  Added new exercises today which were tolerated well. Reviewed tennis ball mobilization as patient had forgotten about this exercise. Overall tolerated session well with no increase in symptoms.   Eval: Patient presents to physical therapy with complaints of neck and low back pain that has been present for a couple of years.  Patient limited in range of motion and strength that is contributing to current level of function.  Educated patient in benefits of physical therapy as well as basic home exercise program.  Answered all questions send patient would greatly benefit from skilled physical therapy at this time to improve overall function and quality of life.   OBJECTIVE IMPAIRMENTS: Abnormal gait, decreased activity tolerance, difficulty walking, decreased ROM, decreased strength, improper body mechanics, postural dysfunction, and pain.    ACTIVITY LIMITATIONS:  lifting, bending, standing, sleeping, transfers, and locomotion level   PARTICIPATION LIMITATIONS: meal prep, cleaning, and community activity   PERSONAL FACTORS: Fitness and 1-2 comorbidities: abdominal hernia, B TKA  are also affecting patient's functional outcome.    REHAB POTENTIAL: Good   CLINICAL DECISION MAKING: Stable/uncomplicated    EVALUATION COMPLEXITY: Low     GOALS: Goals reviewed with patient? yes   SHORT TERM GOALS: Target date: 02/23/2023  Patient will be independent in self management strategies to improve quality of life and functional outcomes. Baseline: New Program Goal status: MET   2.  Patient will report at least 50% improvement in overall symptoms and/or function to demonstrate improved functional mobility Baseline: 0% better Goal status: partially met   3.  Patient will be able to demonstrate pain-free neck range of motion in all directions Baseline: Painful Goal status: MET       LONG TERM GOALS: Target date: 06/29/2023   Patient will report at least 75% improvement in overall symptoms and/or function to demonstrate improved functional mobility-  for R hip  Goal status: new/updated    2.  Patient will demonstrate improved lumbar range of motion by least 25% in all directions Goal status: MET   3.  Patient will report performing daily mobility routine to reduce stiffness in neck and back Goal status: MET  4. Pt to demo ability for pain free strengthening for HEP, for hip abd and ER motions- to improve strength and pain in hip.   Goal status: New        PLAN:   PT FREQUENCY: 1-2x/week    PT DURATION: 8 weeks   PLANNED INTERVENTIONS: Therapeutic exercises, Therapeutic activity, Neuromuscular re-education, Balance training, Gait training, Patient/Family education, Self Care, Joint mobilization, Joint manipulation, Vestibular training, Canalith repositioning, Orthotic/Fit training, Aquatic Therapy, Dry Needling, Electrical stimulation, Spinal manipulation, Spinal mobilization, Cryotherapy, Moist heat, Traction, Ultrasound, and Re-evaluation    PLAN FOR NEXT SESSION:  focus on hip abd strength and ability for pain free HEP/strengthening. Manual as needed for R hip pain    KX modifier added - patient would greatly benefit from skilled PT to work on improving overall quality of life and  overall function.  3:55 PM, 05/25/23 Tereasa Coop, DPT Physical Therapy with [facility]

## 2023-05-30 ENCOUNTER — Ambulatory Visit (INDEPENDENT_AMBULATORY_CARE_PROVIDER_SITE_OTHER): Payer: Medicare Other | Admitting: Physical Therapy

## 2023-05-30 DIAGNOSIS — M542 Cervicalgia: Secondary | ICD-10-CM | POA: Diagnosis not present

## 2023-05-30 DIAGNOSIS — M25551 Pain in right hip: Secondary | ICD-10-CM | POA: Diagnosis not present

## 2023-05-30 DIAGNOSIS — M6281 Muscle weakness (generalized): Secondary | ICD-10-CM

## 2023-05-30 NOTE — Therapy (Unsigned)
OUTPATIENT PHYSICAL THERAPY TREATMENT NOTE   Patient Name: Austin [name] MRN: [id number] DOB:1941-08-07, 82 y.o., male Today's Date: 05/30/2023  PCP: Ardith Dark, MD   REFERRING PROVIDER: Clementeen Graham    END OF SESSION:     Past Medical History:  Diagnosis Date   Asthma    Depression    Hypertension    Prostate cancer [facility])    Urinary incontinence    Past Surgical History:  Procedure Laterality Date   GALLBLADDER SURGERY  1997   REPLACEMENT TOTAL KNEE BILATERAL  2001   2007   surgery replacement Bilateral 2005   Patient Active Problem List   Diagnosis Date Noted   Viral URI 12/10/2022   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   Seasonal allergic rhinitis 08/31/2021   Vitamin B12 deficiency 02/10/2021   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  REFERRING DIAG: M50.90 (ICD-10-CM) - Cervical disc disease    Rationale for Evaluation and Treatment: Rehabilitation   ONSET DATE: a couple years   SUBJECTIVE:                                                                                                                                                                                                          SUBJECTIVE STATEMENT: 05/30/2023  Pt states most pain in the AM, still very stiff/sore in low back first thing in the morning.    He has increased pain in R hip, states pain with sleeping on it,  (sleeps on R side), as well as feeling stiff in the morning. Feels soreness with trying to strengthen hip with previous HEP.  We did work quite a bit on hip mobility previously in PT.    01/12/23:  Eval  Right sided neck pain reported and in right low back. States that the neck pain has been going on for 1-2 years with no change over the last 2 years. States he talked to MD about pain who suggested PT.  States neck pain is like a clunk and when he  does his neck exercises he can get like a clunking feeling when he moves it wrong. States certain movements make his neck pain worse.  Low back pain has been going on for a long time and he can't walk with his wife without his back pain and asthma. Reports that walking and standing makes his pain in his back worse. Pain is described as sharp at times but not sure if that goes into legs. States pain usually on the right.  States he [facility] to the senior center and takes tai chi and has no pain with this.  Hand dominance: Right   PERTINENT HISTORY:  Asthma, HTN, B TKR, B shoulder surgery (total shoulder per pt <5 years ago), diaphragmatic hernia repaired    PAIN:  Are you having pain? Yes R hip,  3/10, pain with sleeping on it, trying to exercise.  Relieving: none stated.    PRECAUTIONS: None   WEIGHT BEARING RESTRICTIONS: No   FALLS:  Has patient fallen in last 6 months? No     OCCUPATION: not working   PLOF: Independent   PATIENT GOALS: to have less pain   OBJECTIVE: updated 05/04/23   DIAGNOSTIC FINDINGS:  None at this time   COGNITION: Overall cognitive status: Within functional limits for tasks assessed   SENSATION: Not tested   POSTURE: rounded shoulders, forward head, and flexed trunk                    Lumber AROM:    01/12/2023   AROM 02/23/23 05/04/23   Flexion  50% limited Hands to toes (decreased motion from back, more from hamstrings )  Hands to toes (decreased motion from back, more from hamstrings )   Extension  90%  limited 80 % limited      R ROT        L ROT        R SB  75% limited 50 % limited  25 % limited  L SB 75% limited 50 % limited   25 % limited                        * Pain              (Blank rows = not tested)  Hip ROM:  Flexion: WFL IR/ER: mild/mod limitation on L,  R WFL  Ext: mod limitation bilaterally        LE Measurements           Lower Extremity Right 01/12/2023 Left 01/12/2023 R 02/23/23 L 02/23/23 R 7/24 L 7/24    A/PROM MMT A/PROM MMT      Hip Flexion WFL 4 WFL 4- 4+ 4- 4+ 4-  Hip Extension              Hip Abduction           3- 4  Hip Adduction              Hip Internal rotation 30   5        Hip External rotation 45   20        Knee Flexion   4   4 4+ 4+    Knee Extension   4-   4- 4+ 4+    Ankle Dorsiflexion              Ankle Plantarflexion              Ankle  Inversion              Ankle Eversion               (Blank rows = not tested) * pain   PALPATION:  tenderness to palpate R gr trochanter   FUNCTIONAL TESTS:  Holds breath with transitional movements- abdominal hernia present   TODAY'S TREATMENT:                                                                                                                              DATE:    05/30/2023 Therapeutic Exercise: Aerobic:   Supine: bent knee fall outs 2 minutes ;  piriformis stretch x3 30" holds B;  bridging 2 x 10;  S/L:    Seated:  hip IR 3x10 B, hip add iso 5" holds 2 minutes, STS with wide stance - elevated height (sumo squat) 2x10  Neuromuscular Re-education:  Manual Therapy:     PATIENT EDUCATION:  Education details: on HEP, on previous neck exercises for neck rehab Person educated: Patient Education method: Explanation, Demonstration, and Handouts Education comprehension: verbalized understanding     HOME EXERCISE PROGRAM: [id number]   ASSESSMENT:   CLINICAL IMPRESSION:  05/30/2023   Eval: Patient presents to physical therapy with complaints of neck and low back pain that has  been present for a couple of years.  Patient limited in range of motion and strength that is contributing to current level of function.  Educated patient in benefits of physical therapy as well as basic home exercise program.  Answered all questions send patient would greatly benefit from skilled physical therapy at this time to improve overall function and quality of life.   OBJECTIVE IMPAIRMENTS: Abnormal gait, decreased activity tolerance, difficulty walking, decreased ROM, decreased strength, improper body mechanics, postural dysfunction, and pain.    ACTIVITY LIMITATIONS: lifting, bending, standing, sleeping, transfers, and locomotion level   PARTICIPATION LIMITATIONS: meal prep, cleaning, and community activity   PERSONAL FACTORS: Fitness and 1-2 comorbidities: abdominal hernia, B TKA  are also affecting patient's functional outcome.    REHAB POTENTIAL: Good   CLINICAL DECISION MAKING: Stable/uncomplicated   EVALUATION COMPLEXITY: Low     GOALS: Goals reviewed with patient? yes   SHORT TERM GOALS: Target date: 02/23/2023  Patient will be independent in self management strategies to improve quality of life and functional outcomes. Baseline: New Program Goal status: MET   2.  Patient will report at least 50% improvement in overall symptoms and/or function to demonstrate improved functional mobility Baseline: 0% better Goal status: partially met   3.  Patient will be able to demonstrate pain-free neck range of motion in all directions Baseline: Painful Goal status: MET       LONG TERM GOALS: Target date: 06/29/2023   Patient will report at least 75% improvement in overall symptoms and/or function to demonstrate improved functional mobility-  for R hip  Goal status: new/updated    2.  Patient will demonstrate improved lumbar range of motion by least 25% in all directions Goal status: MET   3.  Patient will report performing daily mobility routine to reduce stiffness in neck  and back Goal status: MET  4. Pt to demo ability for pain free strengthening for HEP, for hip abd and ER motions- to improve strength and pain in hip.   Goal status: New        PLAN:   PT FREQUENCY: 1-2x/week    PT DURATION: 8 weeks   PLANNED INTERVENTIONS: Therapeutic exercises, Therapeutic activity, Neuromuscular re-education, Balance training, Gait training, Patient/Family education, Self Care, Joint mobilization, Joint manipulation, Vestibular training, Canalith repositioning, Orthotic/Fit training, Aquatic Therapy, Dry Needling, Electrical stimulation, Spinal manipulation, Spinal mobilization, Cryotherapy, Moist heat, Traction, Ultrasound, and Re-evaluation    PLAN FOR NEXT SESSION:  focus on hip abd strength and ability for pain free HEP/strengthening. Manual as needed for R hip pain    KX modifier added - patient would greatly benefit from skilled PT to work on improving overall quality of life and overall function.  Sedalia Muta, PT, DPT 4:31 PM  05/30/23

## 2023-05-31 ENCOUNTER — Encounter: Payer: Self-pay | Admitting: Physical Therapy

## 2023-06-06 ENCOUNTER — Encounter: Payer: Self-pay | Admitting: Physical Therapy

## 2023-06-06 ENCOUNTER — Ambulatory Visit: Payer: Medicare Other | Admitting: Physical Therapy

## 2023-06-06 DIAGNOSIS — M25551 Pain in right hip: Secondary | ICD-10-CM

## 2023-06-06 DIAGNOSIS — M6281 Muscle weakness (generalized): Secondary | ICD-10-CM | POA: Diagnosis not present

## 2023-06-06 NOTE — Therapy (Signed)
OUTPATIENT PHYSICAL THERAPY TREATMENT NOTE   Patient Name: Austin [name] MRN: [id number] DOB:October 23, 1940, 82 y.o., male Today's Date: 06/06/2023   PCP: Ardith Dark, MD   REFERRING PROVIDER: Clementeen Graham    END OF SESSION:   PT End of Session - 06/06/23 1848     Visit Number 22    Number of Visits 24    Date for PT Re-Evaluation 06/29/23    Authorization Type medicare  PN done at visit 11, recert at visit 17.    Authorization Time Period Use KX    PT Start Time 1346    PT Stop Time 1428    PT Time Calculation (min) 42 min    Activity Tolerance Patient tolerated treatment well    Behavior During Therapy WFL for tasks assessed/performed              Past Medical History:  Diagnosis Date   Asthma    Depression    Hypertension    Prostate cancer (HCC)    Urinary incontinence    Past Surgical History:  Procedure Laterality Date   GALLBLADDER SURGERY  1997   REPLACEMENT TOTAL KNEE BILATERAL  2001   2007   surgery replacement Bilateral 2005   Patient Active Problem List   Diagnosis Date Noted   Viral URI 12/10/2022   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   Seasonal allergic rhinitis 08/31/2021   Vitamin B12 deficiency 02/10/2021   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   Chronic low back pain 02/11/2020   Insomnia 02/11/2020   Hyperglycemia 11/08/2019   Vitamin D deficiency 11/08/2019   Essential hypertension 11/08/2019   Gastroesophageal reflux disease 11/08/2019   Depression, major, single episode, 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   Cervical disc disease 11/08/2019   Asthma, persistent controlled 11/08/2019   Prostate cancer (HCC) 11/08/2019   Status post cataract extraction 11/08/2019   Dyslipidemia 11/08/2019    PCP: Ardith Dark, MD  REFERRING PROVIDER: Clementeen Graham    THERAPY DIAG:  Muscle weakness (generalized)  Pain in right hip  REFERRING DIAG: M50.90 (ICD-10-CM) - Cervical disc disease    Rationale for Evaluation  and Treatment: Rehabilitation   ONSET DATE: a couple years   SUBJECTIVE:                                                                                                                                                                                                          SUBJECTIVE STATEMENT: 06/06/2023 Pt reports R hip getting better. He has mild soreness when sleeping at times, but having  less tenderness and less pain with activity. He has been doing HEP.    01/12/23:  Eval  Right sided neck pain reported and in right low back. States that the neck pain has been going on for 1-2 years with no change over the last 2 years. States he talked to MD about pain who suggested PT.  States neck pain is like a clunk and when he does his neck exercises he can get like a clunking feeling when he moves it wrong. States certain movements make his neck pain worse.  Low back pain has been going on for a long time and he can't walk with his wife without his back pain and asthma. Reports that walking and standing makes his pain in his back worse. Pain is described as sharp at times but not sure if that goes into legs. States pain usually on the right.  States he goes to the senior center and takes tai chi and has no pain with this.  Hand dominance: Right   PERTINENT HISTORY:  Asthma, HTN, B TKR, B shoulder surgery (total shoulder per pt <5 years ago), diaphragmatic hernia repaired    PAIN:  Are you having pain? Yes R hip,  3/10, pain with sleeping on it, trying to exercise.  Relieving: none stated.    PRECAUTIONS: None   WEIGHT BEARING RESTRICTIONS: No   FALLS:  Has patient fallen in last 6 months? No     OCCUPATION: not working   PLOF: Independent   PATIENT GOALS: to have less pain   OBJECTIVE: updated 05/04/23   DIAGNOSTIC FINDINGS:  None at this time   COGNITION: Overall cognitive status: Within functional limits for tasks assessed   SENSATION: Not tested   POSTURE: rounded  shoulders, forward head, and flexed trunk                    Lumber AROM:    01/12/2023   AROM 02/23/23 05/04/23   Flexion  50% limited Hands to toes (decreased motion from back, more from hamstrings )  Hands to toes (decreased motion from back, more from hamstrings )   Extension  90% limited 80 % limited      R ROT        L ROT        R SB  75% limited 50 % limited  25 % limited  L SB 75% limited 50 % limited   25 % limited                        * Pain              (Blank rows = not tested)  Hip ROM:  Flexion: WFL IR/ER: mild/mod limitation on L,  R WFL  Ext: mod limitation bilaterally        LE Measurements            Lower Extremity Right 01/12/2023 Left 01/12/2023 R 02/23/23 L 02/23/23 R 7/24 L 7/24 R 06/06/23    A/PROM MMT A/PROM MMT       Hip Flexion WFL 4 WFL 4- 4+ 4- 4+ 4- 4+  Hip Extension               Hip Abduction           3- 4 4  Hip Adduction               Hip Internal rotation 30   5  Hip External rotation 45   20         Knee Flexion   4   4 4+ 4+     Knee Extension   4-   4- 4+ 4+     Ankle Dorsiflexion               Ankle Plantarflexion               Ankle Inversion               Ankle Eversion                (Blank rows = not tested) * pain   PALPATION:  tenderness to palpate R gr trochanter   FUNCTIONAL TESTS:  Holds breath with transitional movements- abdominal hernia present   TODAY'S TREATMENT:                                                                                                                              DATE:    06/06/2023 Therapeutic Exercise: Aerobic:   Supine: SKTC 30 sec x 2 bil; bent knee fall outs 2 minutes ;  piriformis stretch x3 30" holds B;  bridging 2 x 10;  S/L:   hip abd 2 x10 on R Seated:  HSS 30 sec x 3 bil;  STS with wide stance - elevated height (sumo squat) 2x10  Standing: hip abd x 10 bil;   Marching/slow x 20;  Neuromuscular Re-education:  Manual Therapy:     PATIENT EDUCATION:  Education  details: on HEP,  Person educated: Patient Education method: Programmer, multimedia, Demonstration, and Handouts Education comprehension: verbalized understanding     HOME EXERCISE PROGRAM: [id number]   ASSESSMENT:   CLINICAL IMPRESSION:  06/06/2023 Pt with improved stiffness and soreness after most sessions. We discussed trying to do more hip mobility first thing in AM, if that is the time of the most discomfort. R hip seems to be improving with less pain. He has no tenderness to palpate today, and has improved strength for hip abd on R. Discussed having 1 more visit, and if still doing well, will move to d/c to HEP. Pt in agreement with plan.    Eval: Patient presents to physical therapy with complaints of neck and low back pain that has been present for a couple of years.  Patient limited in range of motion and strength that is contributing to current level of function.  Educated patient in benefits of physical therapy as well as basic home exercise program.  Answered all questions send patient would greatly benefit from skilled physical therapy at this time to improve overall function and quality of life.   OBJECTIVE IMPAIRMENTS: Abnormal gait, decreased activity tolerance, difficulty walking, decreased ROM, decreased strength, improper body mechanics, postural dysfunction, and pain.    ACTIVITY LIMITATIONS: lifting, bending, standing, sleeping, transfers, and locomotion level   PARTICIPATION LIMITATIONS: meal prep, cleaning, and community activity   PERSONAL FACTORS:  Fitness and 1-2 comorbidities: abdominal hernia, B TKA  are also affecting patient's functional outcome.    REHAB POTENTIAL: Good   CLINICAL DECISION MAKING: Stable/uncomplicated   EVALUATION COMPLEXITY: Low     GOALS: Goals reviewed with patient? yes   SHORT TERM GOALS: Target date: 02/23/2023  Patient will be independent in self management strategies to improve quality of life and functional outcomes. Baseline: New  Program Goal status: MET   2.  Patient will report at least 50% improvement in overall symptoms and/or function to demonstrate improved functional mobility Baseline: 0% better Goal status: partially met   3.  Patient will be able to demonstrate pain-free neck range of motion in all directions Baseline: Painful Goal status: MET       LONG TERM GOALS: Target date: 06/29/2023   Patient will report at least 75% improvement in overall symptoms and/or function to demonstrate improved functional mobility-  for R hip  Goal status: new/updated    2.  Patient will demonstrate improved lumbar range of motion by least 25% in all directions Goal status: MET   3.  Patient will report performing daily mobility routine to reduce stiffness in neck and back Goal status: MET  4. Pt to demo ability for pain free strengthening for HEP, for hip abd and ER motions- to improve strength and pain in hip.   Goal status: New        PLAN:   PT FREQUENCY: 1-2x/week    PT DURATION: 8 weeks   PLANNED INTERVENTIONS: Therapeutic exercises, Therapeutic activity, Neuromuscular re-education, Balance training, Gait training, Patient/Family education, Self Care, Joint mobilization, Joint manipulation, Vestibular training, Canalith repositioning, Orthotic/Fit training, Aquatic Therapy, Dry Needling, Electrical stimulation, Spinal manipulation, Spinal mobilization, Cryotherapy, Moist heat, Traction, Ultrasound, and Re-evaluation    PLAN FOR NEXT SESSION:  focus on hip abd strength and ability for pain free HEP/strengthening. Manual as needed for R hip pain    KX modifier added - patient would greatly benefit from skilled PT to work on improving overall quality of life and overall function.  Sedalia Muta, PT, DPT 6:49 PM  06/06/23

## 2023-06-09 ENCOUNTER — Ambulatory Visit: Payer: Medicare Other | Admitting: Nurse Practitioner

## 2023-06-15 ENCOUNTER — Ambulatory Visit (INDEPENDENT_AMBULATORY_CARE_PROVIDER_SITE_OTHER): Payer: Medicare Other | Admitting: Physical Therapy

## 2023-06-15 ENCOUNTER — Encounter: Payer: Self-pay | Admitting: Physical Therapy

## 2023-06-15 DIAGNOSIS — M6281 Muscle weakness (generalized): Secondary | ICD-10-CM | POA: Diagnosis not present

## 2023-06-15 DIAGNOSIS — M25551 Pain in right hip: Secondary | ICD-10-CM

## 2023-06-15 NOTE — Therapy (Signed)
OUTPATIENT PHYSICAL THERAPY TREATMENT NOTE   Patient Name: Austin [name] MRN: [id number] DOB:[birth year], 82 y.o., male Today's Date: 06/15/2023   PCP: Ardith Dark, MD   REFERRING PROVIDER: Clementeen Graham    END OF SESSION:   PT End of Session - 06/15/23 1053     Visit Number 23    Number of Visits 24    Date for PT Re-Evaluation 06/29/23    Authorization Type medicare  PN done at visit 11, recert at visit 17.    Authorization Time Period Use KX    PT Start Time 1055    PT Stop Time 1137    PT Time Calculation (min) 42 min    Activity Tolerance Patient tolerated treatment well    Behavior During Therapy WFL for tasks assessed/performed              Past Medical History:  Diagnosis Date   Asthma    Depression    Hypertension    Prostate cancer (HCC)    Urinary incontinence    Past Surgical History:  Procedure Laterality Date   GALLBLADDER SURGERY  1997   REPLACEMENT TOTAL KNEE BILATERAL  2001   2007   surgery replacement Bilateral 2005   Patient Active Problem List   Diagnosis Date Noted   Viral URI 12/10/2022   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   Seasonal allergic rhinitis 08/31/2021   Vitamin B12 deficiency 02/10/2021   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   Chronic low back pain 02/11/2020   Insomnia 02/11/2020   Hyperglycemia 11/08/2019   Vitamin D deficiency 11/08/2019   Essential hypertension 11/08/2019   Gastroesophageal reflux disease 11/08/2019   Depression, major, single episode, 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   Cervical disc disease 11/08/2019   Asthma, persistent controlled 11/08/2019   Prostate cancer (HCC) 11/08/2019   Status post cataract extraction 11/08/2019   Dyslipidemia 11/08/2019    PCP: Ardith Dark, MD  REFERRING PROVIDER: Clementeen Graham    THERAPY DIAG:  Muscle weakness (generalized)  Pain in right hip  REFERRING DIAG: M50.90 (ICD-10-CM) - Cervical disc disease    Rationale for Evaluation  and Treatment: Rehabilitation   ONSET DATE: a couple years   SUBJECTIVE:                                                                                                                                                                                                          SUBJECTIVE STATEMENT: 06/15/2023 Pt reports R hip getting better. He has mild soreness when sleeping at times, but having  less tenderness and less pain with activity. He has been doing HEP and going to senior center.    01/12/23:  Eval  Right sided neck pain reported and in right low back. States that the neck pain has been going on for 1-2 years with no change over the last 2 years. States he talked to MD about pain who suggested PT.  States neck pain is like a clunk and when he does his neck exercises he can get like a clunking feeling when he moves it wrong. States certain movements make his neck pain worse.  Low back pain has been going on for a long time and he can't walk with his wife without his back pain and asthma. Reports that walking and standing makes his pain in his back worse. Pain is described as sharp at times but not sure if that goes into legs. States pain usually on the right.  States he goes to the senior center and takes tai chi and has no pain with this.  Hand dominance: Right   PERTINENT HISTORY:  Asthma, HTN, B TKR, B shoulder surgery (total shoulder per pt <5 years ago), diaphragmatic hernia repaired    PAIN:  Are you having pain? Yes R hip,  3/10, pain with sleeping on it, trying to exercise.  Relieving: none stated.    PRECAUTIONS: None   WEIGHT BEARING RESTRICTIONS: No   FALLS:  Has patient fallen in last 6 months? No     OCCUPATION: not working   PLOF: Independent   PATIENT GOALS: to have less pain   OBJECTIVE: updated 06/15/23   DIAGNOSTIC FINDINGS:  None at this time   COGNITION: Overall cognitive status: Within functional limits for tasks assessed   SENSATION: Not tested    POSTURE: rounded shoulders, forward head, and flexed trunk                    [place]umber AROM:    01/12/2023   AROM 02/23/23 05/04/23   Flexion  50% limited Hands to toes (decreased motion from back, more from hamstrings )  Hands to toes (decreased motion from back, more from hamstrings )   Extension  90% limited 80 % limited      R ROT        L ROT        R SB  75% limited 50 % limited  25 % limited  L SB 75% limited 50 % limited   25 % limited                        * Pain              (Blank rows = not tested)  Hip ROM:  Flexion: WFL IR/ER: mild/mod limitation on L,  R WFL  Ext: mod limitation bilaterally        LE Measurements            Lower Extremity Right 01/12/2023 Left 01/12/2023 R 02/23/23 L 02/23/23 R 7/24 L 7/24 R 06/15/23    A/PROM MMT A/PROM MMT       Hip Flexion WFL 4 WFL 4- 4+ 4- 4+ 4- 4+  Hip Extension               Hip Abduction           3- 4 4  Hip Adduction               Hip Internal  rotation 30   5         Hip External rotation 45   20         Knee Flexion   4   4 4+ 4+     Knee Extension   4-   4- 4+ 4+     Ankle Dorsiflexion               Ankle Plantarflexion               Ankle Inversion               Ankle Eversion                (Blank rows = not tested) * pain   PALPATION:  mild tenderness to palpate R gr trochanter   FUNCTIONAL TESTS:  Holds breath with transitional movements- abdominal hernia present   TODAY'S TREATMENT:                                                                                                                              DATE:    06/15/2023 Therapeutic Exercise: Aerobic:   Supine: SKTC 30 sec x 2 bil; bent knee fall outs 2 minutes;  piriformis stretch x3 30" holds B;  bridging 2 x 10;  S/L:   hip abd  x10  bil , clams x 10 bil;  Seated:  HSS 30 sec x 3 bil;  STS -  2x10  Standing: hip abd x 10 bil;   Marching/slow x 20;  Neuromuscular Re-education:  Manual Therapy:     PATIENT EDUCATION:  Education details:  on HEP,  Person educated: Patient Education method: Programmer, multimedia, Demonstration, and Handouts Education comprehension: verbalized understanding     HOME EXERCISE PROGRAM: [id number]   ASSESSMENT:   CLINICAL IMPRESSION:  06/15/2023 Pt with improved stiffness and soreness after most sessions. He is doing much better with management of pain and stiffness at home. She has very mild tenderness to palpate lateral hip today, and is only feeling soreness in mornings. He does have stiffness in L hip, and weakness in bil hips, that he will continue to work on with HEP. Pt has met goals at this time, and is ready for d/c to HEP. It will be helpful for him to continue back and hip mobility and strengthening long term for management.   Eval: Patient presents to physical therapy with complaints of neck and low back pain that has been present for a couple of years.  Patient limited in range of motion and strength that is contributing to current level of function.  Educated patient in benefits of physical therapy as well as basic home exercise program.  Answered all questions send patient would greatly benefit from skilled physical therapy at this time to improve overall function and quality of life.   OBJECTIVE IMPAIRMENTS: Abnormal gait, decreased activity tolerance, difficulty walking, decreased ROM, decreased strength, improper body mechanics, postural dysfunction, and  pain.    ACTIVITY LIMITATIONS: lifting, bending, standing, sleeping, transfers, and locomotion level   PARTICIPATION LIMITATIONS: meal prep, cleaning, and community activity   PERSONAL FACTORS: Fitness and 1-2 comorbidities: abdominal hernia, B TKA  are also affecting patient's functional outcome.    REHAB POTENTIAL: Good   CLINICAL DECISION MAKING: Stable/uncomplicated   EVALUATION COMPLEXITY: Low     GOALS: Goals reviewed with patient? yes   SHORT TERM GOALS: Target date: 02/23/2023  Patient will be independent in self management  strategies to improve quality of life and functional outcomes. Baseline: New Program Goal status: MET   2.  Patient will report at least 50% improvement in overall symptoms and/or function to demonstrate improved functional mobility Baseline: 0% better Goal status: partially met   3.  Patient will be able to demonstrate pain-free neck range of motion in all directions Baseline: Painful Goal status: MET       LONG TERM GOALS: Target date: 06/29/2023   Patient will report at least 75% improvement in overall symptoms and/or function to demonstrate improved functional mobility-  for R hip  Goal status: MET   2.  Patient will demonstrate improved lumbar range of motion by least 25% in all directions Goal status: MET   3.  Patient will report performing daily mobility routine to reduce stiffness in neck and back Goal status: MET  4. Pt to demo ability for pain free strengthening for HEP, for hip abd and ER motions- to improve strength and pain in hip.   Goal status: MET        PLAN:   PT FREQUENCY: 1-2x/week    PT DURATION: 8 weeks   PLANNED INTERVENTIONS: Therapeutic exercises, Therapeutic activity, Neuromuscular re-education, Balance training, Gait training, Patient/Family education, Self Care, Joint mobilization, Joint manipulation, Vestibular training, Canalith repositioning, Orthotic/Fit training, Aquatic Therapy, Dry Needling, Electrical stimulation, Spinal manipulation, Spinal mobilization, Cryotherapy, Moist heat, Traction, Ultrasound, and Re-evaluation    PLAN FOR NEXT SESSION:    KX modifier added - patient would greatly benefit from skilled PT to work on improving overall quality of life and overall function.  Sedalia Muta, PT, DPT 1:12 PM  06/15/23    PHYSICAL THERAPY DISCHARGE SUMMARY  Visits from Start of Care: 23   Plan: Patient agrees to discharge.  Patient goals were  met. Patient is being discharged due to meeting the stated rehab goals.       Sedalia Muta, PT, DPT 1:17 PM  06/15/23

## 2023-06-28 DIAGNOSIS — Z23 Encounter for immunization: Secondary | ICD-10-CM

## 2023-06-29 ENCOUNTER — Other Ambulatory Visit: Payer: Self-pay | Admitting: Family Medicine

## 2023-06-29 ENCOUNTER — Other Ambulatory Visit: Payer: Self-pay | Admitting: Internal Medicine

## 2023-06-29 DIAGNOSIS — J301 Allergic rhinitis due to pollen: Secondary | ICD-10-CM

## 2023-07-01 ENCOUNTER — Other Ambulatory Visit: Payer: Self-pay | Admitting: Family Medicine

## 2023-07-14 ENCOUNTER — Telehealth: Payer: Self-pay | Admitting: Nurse Practitioner

## 2023-07-14 ENCOUNTER — Telehealth: Payer: Self-pay | Admitting: *Deleted

## 2023-07-14 ENCOUNTER — Encounter: Payer: Self-pay | Admitting: Nurse Practitioner

## 2023-07-14 ENCOUNTER — Ambulatory Visit (INDEPENDENT_AMBULATORY_CARE_PROVIDER_SITE_OTHER): Payer: Medicare Other | Admitting: Nurse Practitioner

## 2023-07-14 VITALS — BP 146/76 | HR 87 | Ht 69.0 in | Wt 190.0 lb

## 2023-07-14 DIAGNOSIS — J449 Chronic obstructive pulmonary disease, unspecified: Secondary | ICD-10-CM

## 2023-07-14 DIAGNOSIS — R490 Dysphonia: Secondary | ICD-10-CM | POA: Insufficient documentation

## 2023-07-14 DIAGNOSIS — J302 Other seasonal allergic rhinitis: Secondary | ICD-10-CM | POA: Diagnosis not present

## 2023-07-14 DIAGNOSIS — J4489 Other specified chronic obstructive pulmonary disease: Secondary | ICD-10-CM

## 2023-07-14 MED ORDER — SPACER/AERO-HOLDING CHAMBERS DEVI
1.0000 | 1 refills | Status: DC
Start: 2023-07-14 — End: 2024-06-29

## 2023-07-14 NOTE — Telephone Encounter (Signed)
Carlette states referral needs to be signed by Doctor, Dr. [name] is his Doctor. [place] phone number is [phone].

## 2023-07-14 NOTE — Patient Instructions (Addendum)
Continue breztri inhaler 2 puffs Twice daily, rinse and gargle afterwards. Use with spacer  Continue albuterol neb 3 mL or inhaler 2 puffs every 6 hours as needed for shortness of breath or wheezing Continue loratidine 10 mg daily Continue singulair 10 mg At bedtime Continue omeprazole 40 mg daily for acid reflux Continue nasocort nasal spray 1 spray each nostril daily Continue saline nasal spray 2-3 times a day   Referral to pulmonary rehab   Try using your rescue inhaler 15 minutes before exercise to see if this helps with your breathing    Follow up in 4 months with Dr. [name]. If symptoms do not improve or worsen, please contact office for sooner follow up or seek emergency care

## 2023-07-14 NOTE — Assessment & Plan Note (Signed)
Stable on current regimen   

## 2023-07-14 NOTE — Telephone Encounter (Signed)
Your request was denied We have denied coverage or payment under your Medicare Part D benefit for the following prescription drug(s) that you or your prescriber requested: ALBUTEROL SULFATE Nebu Soln (2.5 MG/3ML)0.083%

## 2023-07-14 NOTE — Assessment & Plan Note (Signed)
Possibly due to ICS? Will add on spacer and reassess at follow up. May have to consider alternative regimen with lower dose ICS if symptoms persist.

## 2023-07-14 NOTE — Assessment & Plan Note (Signed)
Asthma/COPD with moderate obstruction. No recent exacerbations. Compensated on current regimen. Difficulties with exercise/more strenuous activities. Advised him to pretreat with SABA. Will refer him to pulmonary rehab. Action plan in place.  Patient Instructions  Continue breztri inhaler 2 puffs Twice daily, rinse and gargle afterwards. Use with spacer  Continue albuterol neb 3 mL or inhaler 2 puffs every 6 hours as needed for shortness of breath or wheezing Continue loratidine 10 mg daily Continue singulair 10 mg At bedtime Continue omeprazole 40 mg daily for acid reflux Continue nasocort nasal spray 1 spray each nostril daily Continue saline nasal spray 2-3 times a day   Referral to pulmonary rehab   Try using your rescue inhaler 15 minutes before exercise to see if this helps with your breathing    Follow up in 4 months with Dr. [name]. If symptoms do not improve or worsen, please contact office for sooner follow up or seek emergency care

## 2023-07-14 NOTE — Telephone Encounter (Signed)
(  Austin [name]) - [id number] Albuterol Sulfate (2.5 MG/3ML)0.083% nebulizer solution status: PA RequestCreated: September 20th, 2024 [phone]: October 3rd, 2024 Waiting for determination

## 2023-07-14 NOTE — Progress Notes (Signed)
@Patient  ID: Austin [name], male    DOB: [birth year], 82 y.o.   MRN: [id number]  Chief Complaint  Patient presents with   Asthma-COPD overlap syndrome     Referring provider: Ardith Dark, MD  HPI: 82 year old male, former cigar smoker followed for asthma and COPD overlap syndrome.  He is a patient of Dr. Humphrey Rolls and was last seen in office on 12/10/2022.  Past medical history significant for hypertension, GERD, prostate cancer, depression.  TEST/EVENTS:  09/2018 CT chest: Air trapping, tracheobronchial malacia, mild bibasilar atelectasis 01/2020 PFTs: Moderately severe airflow limitation with FEV1 53% of predicted 01/21/2020 ACT: 13 08/13/2021 FeNO: 40 ppb  05/12/2022: OV with Dr. [name]. Using [employer]. Having some tickling in his throat from post nasal drainage. Still taking nasacort. Add ipratropium   12/10/2022: OV with Ekam Besson NP for follow up. He was doing well up until the last 2-3 weeks. He had a cold with nasal congestion and drainage. These symptoms resolve but he is  still struggling with increased cough which is nonproductive and chest congestion. Breathing feels like it's at his baseline. Able to complete ADLs and walk at his own pace without difficulties. Denies fevers, chills, hemoptysis, night sweats. Continues on [place]. Hasn't required albuterol. Using flonase and astelin.  07/14/2023: Today - follow up Patient presents today for follow-up.  He has been doing okay since he was here last.  Has not had any exacerbations requiring steroids or antibiotics.  No hospitalizations.  Not having any issues with his cough.  Breathing feels like it is at his baseline.  He does have trouble with exercising and feeling like his activity tolerance is not the best.  Gets short winded pretty quickly once he starts involving himself and more strenuous activity.  He wants to know if there is something that he can do to help focus more on his breathing and strengthening his lungs.  Denies any chest  congestion or wheezing.  He is having a little bit more voice hoarseness and thinks that this may be due to the [place].  He has been on it for quite some time.  Does not exactly member when the voice hoarseness started.  Not having any significant postnasal drainage.  No difficulties swallowing.  He is taking loratadine, Singulair and Nasacort.  No Known Allergies  Immunization History  Administered Date(s) Administered   Fluad Quad(high Dose 65+) 07/24/2019   Fluad Trivalent(High Dose 65+) 06/28/2023   Influenza, High Dose Seasonal PF 05/31/2021, 07/13/2022   Influenza-Unspecified 06/04/2020   PFIZER(Purple Top)SARS-COV-2 Vaccination 11/23/2019, 12/17/2019, 07/26/2020   PNEUMOCOCCAL CONJUGATE-20 02/10/2021   Tdap 11/04/2022   Zoster Recombinant(Shingrix) 01/14/2022, 04/05/2022    Past Medical History:  Diagnosis Date   Asthma    Depression    Hypertension    Prostate cancer (HCC)    Urinary incontinence     Tobacco History: Social History   Tobacco Use  Smoking Status Former   Types: Cigars   Quit date: 10/22/2019   Years since quitting: 3.7  Smokeless Tobacco Never  Tobacco Comments   smoked occ. with son. did not tolerate it long. 12/14/19.   Counseling given: Not Answered Tobacco comments: smoked occ. with son. did not tolerate it long. 12/14/19.    Outpatient Medications Prior to Visit  Medication Sig Dispense Refill   albuterol (PROVENTIL) (2.5 MG/3ML) 0.083% nebulizer solution USE 1 VIAL BY NEBULIZATION EVERY 4 HOURS AS NEEDED FOR WHEEZING OR SHORTNESS OF BREATH/DYSPNEA. J45.998 375 mL 1   albuterol (VENTOLIN HFA) 108 (90  Base) MCG/ACT inhaler INHALE 1-2 PUFFS BY MOUTH EVERY 6 HOURS AS NEEDED FOR WHEEZE OR SHORTNESS OF BREATH 18 each 5   amLODipine (NORVASC) 5 MG tablet TAKE 1 TABLET (5 MG TOTAL) BY MOUTH DAILY. 90 tablet 3   atorvastatin (LIPITOR) 80 MG tablet TAKE 1 TABLET BY MOUTH EVERY DAY 90 tablet 1   baclofen (LIORESAL) 10 MG tablet Take 0.5-1 tablets (5-10  mg total) by mouth 4 (four) times daily as needed for muscle spasms. 30 each 0   BREZTRI AEROSPHERE 160-9-4.8 MCG/ACT AERO INHALE 2 PUFFS INTO THE LUNGS IN THE MORNING AND AT BEDTIME. 10.7 each 1   Cyanocobalamin (VITAMIN B 12 PO) Take 1,000 Units by mouth.     fenofibrate micronized (LOFIBRA) 200 MG capsule TAKE 1 CAPSULE BY MOUTH EVERY DAY 90 capsule 0   gabapentin (NEURONTIN) 100 MG capsule Take 1 capsule (100 mg total) by mouth at bedtime. 30 capsule 0   glucose blood (FREESTYLE LITE) test strip CHECK BLOOD SUGAR THREE TIMES A DAY E11.9 100 strip 12   ibuprofen (ADVIL) 800 MG tablet TAKE 1 TABLET BY MOUTH EVERY 8 HOURS AS NEEDED 30 tablet 0   Lancets Misc. (UNISTIK 2 EXTRA) MISC Use to check blood sugar 3 times daily/ Dx E11.9 200 each 0   loratadine (CLARITIN) 10 MG tablet TAKE 1 TABLET BY MOUTH EVERY DAY 90 tablet 3   losartan (COZAAR) 100 MG tablet TAKE 1 TABLET BY MOUTH EVERY DAY 90 tablet 3   magnesium oxide (MAG-OX) 400 (240 Mg) MG tablet TAKE 1 TABLET BY MOUTH EVERY DAY 90 tablet 3   montelukast (SINGULAIR) 10 MG tablet TAKE 1 TABLET BY MOUTH EVERYDAY AT BEDTIME 90 tablet 1   nitrofurantoin, macrocrystal-monohydrate, (MACROBID) 100 MG capsule Take 100 mg by mouth daily.     omeprazole (PRILOSEC) 20 MG capsule TAKE 2 CAPSULES BY MOUTH EVERY DAY 180 capsule 1   solifenacin (VESICARE) 5 MG tablet Take 5 mg by mouth daily.     Vitamin D, Cholecalciferol, 50 MCG (2000 UT) CAPS Take by mouth.     No facility-administered medications prior to visit.     Review of Systems:   Constitutional: No weight loss or gain, night sweats, fevers, chills, fatigue, or lassitude. HEENT: No headaches, difficulty swallowing, tooth/dental problems, or sore throat. No sneezing, itching, ear ache, nasal congestion. +voice hoarseness  CV:  No chest pain, orthopnea, PND, swelling in lower extremities, anasarca, dizziness, palpitations, syncope Resp: +baseline shortness of breath with exertion. No wheezing,  No cough. No hemoptysis. No wheezing.  No chest wall deformity GI:  No heartburn, indigestion  GU: No dysuria, change in color of urine, urgency or frequency.  Skin: No rash, lesions, ulcerations MSK:  No joint pain or swelling.   Neuro: No dizziness or lightheadedness.  Psych: No depression or anxiety. Mood stable.     Physical Exam:  BP (!) 146/76   Pulse 87   Ht 5\' 9"  (1.753 m)   Wt 190 lb (86.2 kg)   SpO2 95%   BMI 28.06 kg/m   GEN: Pleasant, interactive, well-nourished; in no acute distress. HEENT:  Normocephalic and atraumatic. PERRLA. Sclera white. Nasal turbinates pink, moist and patent bilaterally. No rhinorrhea present. Oropharynx pink and moist, without exudate or edema.  NECK:  Supple w/ fair ROM. No JVD present. Normal carotid impulses w/o bruits. Thyroid symmetrical with no goiter or nodules palpated. No lymphadenopathy.   CV: RRR, no m/r/g, no peripheral edema. Pulses intact, +2 bilaterally. No cyanosis, pallor  or clubbing. PULMONARY:  Unlabored, regular breathing. Clear bilaterally A&P w/o wheezes/rales/rhonchi. No accessory muscle use. No dullness to percussion. GI: BS present and normoactive. Soft, non-tender to palpation. No organomegaly or masses detected.  MSK: No erythema, warmth or tenderness. Cap refil <2 sec all extrem. No deformities or joint swelling noted.  Neuro: A/Ox3. No focal deficits noted.   Skin: Warm, no lesions or rashe Psych: Normal affect and behavior. Judgement and thought content appropriate.     Lab Results:  CBC    Component Value Date/Time   WBC 5.9 03/31/2023 1420   RBC 4.28 03/31/2023 1420   HGB 14.1 03/31/2023 1420   HCT 42.2 03/31/2023 1420   PLT 240.0 03/31/2023 1420   MCV 98.6 03/31/2023 1420   MCH 32.3 08/13/2020 1053   MCHC 33.4 03/31/2023 1420   RDW 14.0 03/31/2023 1420   LYMPHSABS 1.5 12/18/2021 1205   MONOABS 0.7 12/18/2021 1205   EOSABS 0.1 12/18/2021 1205   BASOSABS 0.0 12/18/2021 1205    BMET    Component  Value Date/Time   NA 140 04/12/2023 1329   K 3.9 04/12/2023 1329   CL 104 04/12/2023 1329   CO2 28 04/12/2023 1329   GLUCOSE 86 04/12/2023 1329   BUN 21 04/12/2023 1329   CREATININE 0.66 04/12/2023 1329   CREATININE 0.65 (L) 08/13/2020 1053   CALCIUM 9.9 04/12/2023 1329    BNP No results found for: "BNP"   Imaging:  No results found.  Administration History     None           No data to display          Lab Results  Component Value Date   NITRICOXIDE 40 01/17/2020        Assessment & Plan:   Asthma-COPD overlap syndrome (HCC) Asthma/COPD with moderate obstruction. No recent exacerbations. Compensated on current regimen. Difficulties with exercise/more strenuous activities. Advised him to pretreat with SABA. Will refer him to pulmonary rehab. Action plan in place.  Patient Instructions  Continue breztri inhaler 2 puffs Twice daily, rinse and gargle afterwards. Use with spacer  Continue albuterol neb 3 mL or inhaler 2 puffs every 6 hours as needed for shortness of breath or wheezing Continue loratidine 10 mg daily Continue singulair 10 mg At bedtime Continue omeprazole 40 mg daily for acid reflux Continue nasocort nasal spray 1 spray each nostril daily Continue saline nasal spray 2-3 times a day   Referral to pulmonary rehab   Try using your rescue inhaler 15 minutes before exercise to see if this helps with your breathing    Follow up in 4 months with Dr. [name]. If symptoms do not improve or worsen, please contact office for sooner follow up or seek emergency care   Seasonal allergic rhinitis Stable on current regimen  Voice hoarseness Possibly due to ICS? Will add on spacer and reassess at follow up. May have to consider alternative regimen with lower dose ICS if symptoms persist.    I spent 35 minutes of dedicated to the care of this patient on the date of this encounter to include pre-visit review of records, face-to-face time with the patient  discussing conditions above, post visit ordering of testing, clinical documentation with the electronic health record, making appropriate referrals as documented, and communicating necessary findings to members of the patients care team.   Noemi Chapel, NP 07/14/2023  Pt aware and understands NP's role.

## 2023-07-19 ENCOUNTER — Telehealth (HOSPITAL_COMMUNITY): Payer: Self-pay

## 2023-07-19 ENCOUNTER — Encounter (HOSPITAL_COMMUNITY): Payer: Self-pay

## 2023-07-19 NOTE — Telephone Encounter (Signed)
PT returned my vm in regards to Pulmonary rehab.    Austin [name] is interested in the Pulmonary Rehab Program. Patient expressed interest. Explained scheduling process and went over insurance, patient verbalized understanding. Someone from our pulmonary rehab staff will contact pt at a later time.

## 2023-07-19 NOTE — Addendum Note (Signed)
Addended by: Lajoyce Lauber A on: 07/19/2023 10:19 AM   Modules accepted: Orders

## 2023-07-19 NOTE — Telephone Encounter (Signed)
Spoke to [facility] with Pulmonary rehab. She stated that order was placed under NP. I have placed a new order under Dr. [name]. Nothing further needed.

## 2023-07-19 NOTE — Telephone Encounter (Signed)
Pt insurance is active and benefits verified through [employer] Co-pay $0, DED $240/$240 met, out of pocket $0/$0 met, co-insurance 20%. no pre-authorization required. 07/19/2023@11 :16,  2ndry Pt insurance is active and benefits verified through [employer] $0, DED $0/$0 met, out of pocket $0/$0 met, co-insurance 0%. no pre-authorization required.

## 2023-07-19 NOTE — Telephone Encounter (Signed)
Attempted to call patient in regards to Pulmonary Rehab - LM on VM °

## 2023-07-19 NOTE — Telephone Encounter (Signed)
Spoke with Ollen Gross at  [facility]. She wanted to verify that we needed a MD signature on the referral. I told her " Yes we do as it was signed by an APP." She said she was going to get it over to the MD to sign!

## 2023-07-20 ENCOUNTER — Telehealth (HOSPITAL_COMMUNITY): Payer: Self-pay

## 2023-07-20 NOTE — Telephone Encounter (Signed)
Attempted to call patient in regards to Pulmonary Rehab - LM on VM Mailed letter 

## 2023-07-26 ENCOUNTER — Telehealth (HOSPITAL_COMMUNITY): Payer: Self-pay

## 2023-07-26 ENCOUNTER — Encounter (HOSPITAL_COMMUNITY): Payer: Self-pay

## 2023-07-26 NOTE — Telephone Encounter (Signed)
Switched pt to the 1:15 pulmonary rehab class time.

## 2023-07-26 NOTE — Telephone Encounter (Signed)
Pt returned phone call and was interested in the pulmonary rehab program. Pt will come in for orientation on 10/18@1  and will attend the 10:15 exercise class time.   Sent package

## 2023-07-27 ENCOUNTER — Telehealth: Payer: Self-pay | Admitting: Family Medicine

## 2023-07-27 ENCOUNTER — Other Ambulatory Visit: Payer: Self-pay | Admitting: *Deleted

## 2023-07-27 MED ORDER — GABAPENTIN 100 MG PO CAPS: 100.0000 mg | ORAL_CAPSULE | Freq: Every day | ORAL | 0 refills | Status: DC

## 2023-07-27 NOTE — Telephone Encounter (Signed)
Prescription Request  07/27/2023  LOV: 03/31/2023  What is the name of the medication or equipment?  gabapentin (NEURONTIN) 100 MG capsule   Have you contacted your pharmacy to request a refill? No   Which pharmacy would you like this sent to?  [id number] - [place], [place] - [address] [place] [address] Phone: [phone] Fax: [phone]    Patient notified that their request is being sent to the clinical staff for review and that they should receive a response within 2 business days.   Please advise at Mobile [phone] (mobile)

## 2023-07-27 NOTE — Telephone Encounter (Signed)
Rx send to pharmacy  

## 2023-07-29 ENCOUNTER — Encounter (HOSPITAL_COMMUNITY)
Admission: RE | Admit: 2023-07-29 | Discharge: 2023-07-29 | Disposition: A | Discharge status: Home / Self Care | Payer: Medicare Other | Source: Ambulatory Visit | Attending: Internal Medicine | Admitting: Internal Medicine

## 2023-07-29 ENCOUNTER — Encounter (HOSPITAL_COMMUNITY): Payer: Self-pay

## 2023-07-29 VITALS — BP 138/68 | HR 81 | Ht 69.0 in | Wt 193.6 lb

## 2023-07-29 DIAGNOSIS — J449 Chronic obstructive pulmonary disease, unspecified: Secondary | ICD-10-CM | POA: Diagnosis present

## 2023-07-29 NOTE — Progress Notes (Signed)
Austin [name] 82 y.o. male  Initial Psychosocial Assessment  Pt psychosocial assessment reveals pt lives with their spouse. Pt is currently retired. Pt hobbies include watching tv and spending time with others. Pt reports his  stress level is low. Areas of stress/anxiety include N/A.  Pt does not exhibit signs of depression. Pt shows good  coping skills with positive outlook . Offered emotional support and reassurance. Will continue to monitor.  07/29/2023 2:44 PM

## 2023-07-29 NOTE — Progress Notes (Signed)
Pulmonary Individual Treatment Plan  Patient Details  Name: Austin [name] MRN: [id number] Date of Birth: Jun 14, 1941 Referring Provider:   Doristine Devoid Pulmonary Rehab Walk Test from 07/29/2023 in [facility]  Referring Provider [name]       Initial Encounter Date:  Flowsheet Row Pulmonary Rehab Walk Test from 07/29/2023 in [facility]  Date 07/29/23       Visit Diagnosis: Stage 2 moderate COPD by GOLD classification (HCC)  Patient's Home Medications on Admission:   Current Outpatient Medications:    albuterol (PROVENTIL) (2.5 MG/3ML) 0.083% nebulizer solution, USE 1 VIAL BY NEBULIZATION EVERY 4 HOURS AS NEEDED FOR WHEEZING OR SHORTNESS OF BREATH/DYSPNEA. J45.998, Disp: 375 mL, Rfl: 1   albuterol (VENTOLIN HFA) 108 (90 Base) MCG/ACT inhaler, INHALE 1-2 PUFFS BY MOUTH EVERY 6 HOURS AS NEEDED FOR WHEEZE OR SHORTNESS OF BREATH, Disp: 18 each, Rfl: 5   amLODipine (NORVASC) 5 MG tablet, TAKE 1 TABLET (5 MG TOTAL) BY MOUTH DAILY., Disp: 90 tablet, Rfl: 3   atorvastatin (LIPITOR) 80 MG tablet, TAKE 1 TABLET BY MOUTH EVERY DAY, Disp: 90 tablet, Rfl: 1   baclofen (LIORESAL) 10 MG tablet, Take 0.5-1 tablets (5-10 mg total) by mouth 4 (four) times daily as needed for muscle spasms., Disp: 30 each, Rfl: 0   BREZTRI AEROSPHERE 160-9-4.8 MCG/ACT AERO, INHALE 2 PUFFS INTO THE LUNGS IN THE MORNING AND AT BEDTIME., Disp: 10.7 each, Rfl: 1   Cyanocobalamin (VITAMIN B 12 PO), Take 1,000 Units by mouth., Disp: , Rfl:    fenofibrate micronized (LOFIBRA) 200 MG capsule, TAKE 1 CAPSULE BY MOUTH EVERY DAY, Disp: 90 capsule, Rfl: 0   gabapentin (NEURONTIN) 100 MG capsule, Take 1 capsule (100 mg total) by mouth at bedtime., Disp: 30 capsule, Rfl: 0   glucose blood (FREESTYLE LITE) test strip, CHECK BLOOD SUGAR THREE TIMES A DAY E11.9, Disp: 100 strip, Rfl: 12   ibuprofen (ADVIL) 800 MG tablet,  TAKE 1 TABLET BY MOUTH EVERY 8 HOURS AS NEEDED, Disp: 30 tablet, Rfl: 0   Lancets Misc. (UNISTIK 2 EXTRA) MISC, Use to check blood sugar 3 times daily/ Dx E11.9, Disp: 200 each, Rfl: 0   loratadine (CLARITIN) 10 MG tablet, TAKE 1 TABLET BY MOUTH EVERY DAY, Disp: 90 tablet, Rfl: 3   losartan (COZAAR) 100 MG tablet, TAKE 1 TABLET BY MOUTH EVERY DAY, Disp: 90 tablet, Rfl: 3   magnesium oxide (MAG-OX) 400 (240 Mg) MG tablet, TAKE 1 TABLET BY MOUTH EVERY DAY, Disp: 90 tablet, Rfl: 3   montelukast (SINGULAIR) 10 MG tablet, TAKE 1 TABLET BY MOUTH EVERYDAY AT BEDTIME, Disp: 90 tablet, Rfl: 1   nitrofurantoin, macrocrystal-monohydrate, (MACROBID) 100 MG capsule, Take 100 mg by mouth daily., Disp: , Rfl:    omeprazole (PRILOSEC) 20 MG capsule, TAKE 2 CAPSULES BY MOUTH EVERY DAY, Disp: 180 capsule, Rfl: 1   solifenacin (VESICARE) 5 MG tablet, Take 5 mg by mouth daily., Disp: , Rfl:    Spacer/Aero-Holding Chambers DEVI, 1 each by Does not apply route as directed., Disp: 1 each, Rfl: 1   Vitamin D, Cholecalciferol, 50 MCG (2000 UT) CAPS, Take by mouth., Disp: , Rfl:   Past Medical History: Past Medical History:  Diagnosis Date   Asthma    Depression    Hypertension    Prostate cancer (HCC)    Urinary incontinence     Tobacco Use: Social History   Tobacco Use  Smoking Status Former   Types: Cigars   Quit date: 10/22/2019   Years since quitting: 3.7  Smokeless Tobacco Never  Tobacco Comments   smoked occ. with son. did not tolerate it long. 12/14/19.    Labs: Review Flowsheet  More data exists      Latest Ref Rng & Units 02/10/2021 11/23/2021 04/23/2022 10/27/2022 03/31/2023  Labs for ITP Cardiac and Pulmonary Rehab  Cholestrol 0 - 200 mg/dL 308  657  846  962  952   LDL (calc) 0 - 99 mg/dL 90  90  841  85  91   HDL-C >39.00 mg/dL 32.44  01.02  72.53  66.44  49.80   Trlycerides 0.0 - 149.0 mg/dL 03.4  74.2  59.5  638.7  83.0   Hemoglobin A1c 4.0 - 5.6 % 5.7  6.2  6.1  6.4  5.8     Details             Capillary Blood Glucose: No results found for: "GLUCAP"   Pulmonary Assessment Scores:  Pulmonary Assessment Scores     Row Name 07/29/23 1341         ADL UCSD   ADL Phase Entry     SOB Score total 53       CAT Score   CAT Score 15       mMRC Score   mMRC Score 3             UCSD: Self-administered rating of dyspnea associated with activities of daily living (ADLs) 6-point scale (0 = "not at all" to 5 = "maximal or unable to do because of breathlessness")  Scoring Scores range from 0 to 120.  Minimally important difference is 5 units  CAT: CAT can identify the health impairment of COPD patients and is better correlated with disease progression.  CAT has a scoring range of zero to 40. The CAT score is classified into four groups of low (less than 10), medium (10 - 20), high (21-30) and very high (31-40) based on the impact level of disease on health status. A CAT score over 10 suggests significant symptoms.  A worsening CAT score could be explained by an exacerbation, poor medication adherence, poor inhaler technique, or progression of COPD or comorbid conditions.  CAT MCID is 2 points  mMRC: mMRC (Modified Medical Research Council) Dyspnea Scale is used to assess the degree of baseline functional disability in patients of respiratory disease due to dyspnea. No minimal important difference is established. A decrease in score of 1 point or greater is considered a positive change.   Pulmonary Function Assessment:  Pulmonary Function Assessment - 07/29/23 1341       Breath   Shortness of Breath Yes;Fear of Shortness of Breath;Limiting activity             Exercise Target Goals: Exercise Program Goal: Individual exercise prescription set using results from initial 6 min walk test and THRR while considering  patient's activity barriers and safety.   Exercise Prescription Goal: Initial exercise prescription builds to 30-45 minutes a day of aerobic  activity, 2-3 days per week.  Home exercise guidelines will be given to patient during program as part of exercise prescription that the participant will acknowledge.  Activity Barriers & Risk Stratification:  Activity Barriers & Cardiac Risk Stratification - 07/29/23 1343       Activity Barriers & Cardiac Risk Stratification   Activity Barriers Left Knee Replacement;Right Knee Replacement;Shortness of Breath;Muscular Weakness;Deconditioning;Back Problems;Balance Concerns;Arthritis  6 Minute Walk:  6 Minute Walk     Row Name 07/29/23 1427         6 Minute Walk   Phase Initial     Distance 1170 feet     Walk Time 6 minutes     # of Rest Breaks 0     MPH 2.22     METS 2.9     RPE 15     Perceived Dyspnea  2     VO2 Peak 10.14     Symptoms No     Resting HR 81 bpm     Resting BP 138/68     Resting Oxygen Saturation  96 %     Exercise Oxygen Saturation  during 6 min walk 90 %     Max Ex. HR 128 bpm     Max Ex. BP 198/80     2 Minute Post BP 140/74       Interval HR   1 Minute HR 104     2 Minute HR 113     3 Minute HR 115     4 Minute HR 121     5 Minute HR 124     6 Minute HR 128     2 Minute Post HR 98     Interval Heart Rate? Yes       Interval Oxygen   Interval Oxygen? Yes     Baseline Oxygen Saturation % 96 %     1 Minute Oxygen Saturation % 96 %     1 Minute Liters of Oxygen 0 L     2 Minute Oxygen Saturation % 91 %     2 Minute Liters of Oxygen 0 L     3 Minute Oxygen Saturation % 91 %     3 Minute Liters of Oxygen 0 L     4 Minute Oxygen Saturation % 91 %     4 Minute Liters of Oxygen 0 L     5 Minute Oxygen Saturation % 90 %     5 Minute Liters of Oxygen 0 L     6 Minute Oxygen Saturation % 90 %     6 Minute Liters of Oxygen 0 L     2 Minute Post Oxygen Saturation % 95 %     2 Minute Post Liters of Oxygen 0 L              Oxygen Initial Assessment:  Oxygen Initial Assessment - 07/29/23 1340       Home Oxygen   Home Oxygen  Device None    Sleep Oxygen Prescription None    Home Exercise Oxygen Prescription None    Home Resting Oxygen Prescription None      Initial 6 min Walk   Oxygen Used None      Program Oxygen Prescription   Program Oxygen Prescription None      Intervention   Short Term Goals To learn and understand importance of maintaining oxygen saturations>88%;To learn and demonstrate proper use of respiratory medications;To learn and understand importance of monitoring SPO2 with pulse oximeter and demonstrate accurate use of the pulse oximeter.;To learn and demonstrate proper pursed lip breathing techniques or other breathing techniques.     Long  Term Goals Verbalizes importance of monitoring SPO2 with pulse oximeter and return demonstration;Maintenance of O2 saturations>88%;Exhibits proper breathing techniques, such as pursed lip breathing or other method taught during program session;Compliance with respiratory medication;Demonstrates proper use of MDI's  Oxygen Re-Evaluation:   Oxygen Discharge (Final Oxygen Re-Evaluation):   Initial Exercise Prescription:  Initial Exercise Prescription - 07/29/23 1400       Date of Initial Exercise RX and Referring Provider   Date 07/29/23    Referring Provider [name]    Expected Discharge Date 10/27/23      Treadmill   MPH 2    Grade 2    Minutes 15      NuStep   Level 2    SPM 60    Minutes 15      Prescription Details   Frequency (times per week) 2    Duration Progress to 30 minutes of continuous aerobic without signs/symptoms of physical distress      Intensity   THRR 40-80% of Max Heartrate 55-110    Ratings of Perceived Exertion 11-13    Perceived Dyspnea 0-4      Progression   Progression Continue to progress workloads to maintain intensity without signs/symptoms of physical distress.      Resistance Training   Training Prescription Yes    Weight blue bands    Reps 10-15             Perform Capillary  Blood Glucose checks as needed.  Exercise Prescription Changes:   Exercise Comments:   Exercise Goals and Review:   Exercise Goals     Row Name 07/29/23 1343             Exercise Goals   Increase Physical Activity Yes       Intervention Provide advice, education, support and counseling about physical activity/exercise needs.;Develop an individualized exercise prescription for aerobic and resistive training based on initial evaluation findings, risk stratification, comorbidities and participant's personal goals.       Expected Outcomes Short Term: Attend rehab on a regular basis to increase amount of physical activity.;Long Term: Add in home exercise to make exercise part of routine and to increase amount of physical activity.;Long Term: Exercising regularly at least 3-5 days a week.       Increase Strength and Stamina Yes       Intervention Provide advice, education, support and counseling about physical activity/exercise needs.;Develop an individualized exercise prescription for aerobic and resistive training based on initial evaluation findings, risk stratification, comorbidities and participant's personal goals.       Expected Outcomes Short Term: Increase workloads from initial exercise prescription for resistance, speed, and METs.;Short Term: Perform resistance training exercises routinely during rehab and add in resistance training at home;Long Term: Improve cardiorespiratory fitness, muscular endurance and strength as measured by increased METs and functional capacity ( )       Able to understand and use rate of perceived exertion (RPE) scale Yes       Intervention Provide education and explanation on how to use RPE scale       Expected Outcomes Short Term: Able to use RPE daily in rehab to express subjective intensity level;Long Term:  Able to use RPE to guide intensity level when exercising independently       Able to understand and use Dyspnea scale Yes       Intervention  Provide education and explanation on how to use Dyspnea scale       Expected Outcomes Short Term: Able to use Dyspnea scale daily in rehab to express subjective sense of shortness of breath during exertion;Long Term: Able to use Dyspnea scale to guide intensity level when exercising independently       Knowledge and  understanding of Target Heart Rate Range (THRR) Yes       Intervention Provide education and explanation of THRR including how the numbers were predicted and where they are located for reference       Expected Outcomes Short Term: Able to state/look up THRR;Long Term: Able to use THRR to govern intensity when exercising independently;Short Term: Able to use daily as guideline for intensity in rehab       Understanding of Exercise Prescription Yes       Intervention Provide education, explanation, and written materials on patient's individual exercise prescription       Expected Outcomes Short Term: Able to explain program exercise prescription;Long Term: Able to explain home exercise prescription to exercise independently                Exercise Goals Re-Evaluation :   Discharge Exercise Prescription (Final Exercise Prescription Changes):   Nutrition:  Target Goals: Understanding of nutrition guidelines, daily intake of sodium 1500mg , cholesterol 200mg , calories 30% from fat and 7% or less from saturated fats, daily to have 5 or more servings of fruits and vegetables.  Biometrics:    Nutrition Therapy Plan and Nutrition Goals:   Nutrition Assessments:  MEDIFICTS Score Key: >=70 Need to make dietary changes  40-70 Heart Healthy Diet <= 40 Therapeutic Level Cholesterol Diet   Picture Your Plate Scores: <65 Unhealthy dietary pattern with much room for improvement. 41-50 Dietary pattern unlikely to meet recommendations for good health and room for improvement. 51-60 More healthful dietary pattern, with some room for improvement.  >60 Healthy dietary pattern,  although there may be some specific behaviors that could be improved.    Nutrition Goals Re-Evaluation:   Nutrition Goals Discharge (Final Nutrition Goals Re-Evaluation):   Psychosocial: Target Goals: Acknowledge presence or absence of significant depression and/or stress, maximize coping skills, provide positive support system. Participant is able to verbalize types and ability to use techniques and skills needed for reducing stress and depression.  Initial Review & Psychosocial Screening:  Initial Psych Review & Screening - 07/29/23 1333       Initial Review   Current issues with Current Sleep Concerns    Comments Pt has over the counter medication to help him fall asleep. Pt denies any psychosocial needs at this time.      Family Dynamics   Good Support System? Yes    Comments wife, son and daughter-in-law      Barriers   Psychosocial barriers to participate in program There are no identifiable barriers or psychosocial needs.      Screening Interventions   Interventions Encouraged to exercise             Quality of Life Scores:  Scores of 19 and below usually indicate a poorer quality of life in these areas.  A difference of  2-3 points is a clinically meaningful difference.  A difference of 2-3 points in the total score of the Quality of Life Index has been associated with significant improvement in overall quality of life, self-image, physical symptoms, and general health in studies assessing change in quality of life.  PHQ-9: Review Flowsheet  More data exists      07/29/2023 03/31/2023 10/27/2022 09/21/2022 07/23/2022  Depression screen PHQ 2/9  Decreased Interest 1 0 0 0 0 0  Down, Depressed, Hopeless 1 0 0 0 0 0  PHQ - 2 Score 2 0 0 0 0 0  Altered sleeping 0 0 0 0 0  Tired, decreased energy  0 0 0 0 0  Change in appetite 0 0 0 0 0  Feeling bad or failure about yourself  1 0 0 0 0  Trouble concentrating 1 0 0 0 0  Moving slowly or fidgety/restless 0 0 0 0 0   Suicidal thoughts 0 0 0 0 0  PHQ-9 Score 4 0 0 0 0  Difficult doing work/chores Somewhat difficult Not difficult at all Not difficult at all Not difficult at all Not difficult at all    Details       Multiple values from one day are sorted in reverse-chronological order        Interpretation of Total Score  Total Score Depression Severity:  1-4 = Minimal depression, 5-9 = Mild depression, 10-14 = Moderate depression, 15-19 = Moderately severe depression, 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    Expected Outcomes For Jaymien to participate in PR free of psychosocial concerns.    Continue Psychosocial Services  No Follow up required             Psychosocial Re-Evaluation:   Psychosocial Discharge (Final Psychosocial Re-Evaluation):   Education: Education Goals: Education classes will be provided on a weekly basis, covering required topics. Participant will state understanding/return demonstration of topics presented.  Learning Barriers/Preferences:  Learning Barriers/Preferences - 07/29/23 1337       Learning Barriers/Preferences   Learning Barriers Sight    Learning Preferences Skilled Demonstration             Education Topics: Know Your Numbers Group instruction that is supported by a PowerPoint presentation. Instructor discusses importance of knowing and understanding resting, exercise, and post-exercise oxygen saturation, heart rate, and blood pressure. Oxygen saturation, heart rate, blood pressure, rating of perceived exertion, and dyspnea are reviewed along with a normal range for these values.    Exercise for the Pulmonary Patient Group instruction that is supported by a PowerPoint  presentation. Instructor discusses benefits of exercise, core components of exercise, frequency, duration, and intensity of an exercise routine, importance of utilizing pulse oximetry during exercise, safety while exercising, and options of places to exercise outside of rehab.    MET Level  Group instruction provided by PowerPoint, verbal discussion, and written material to support subject matter. Instructor reviews what METs are and how to increase METs.    Pulmonary Medications Verbally interactive group education provided by instructor with focus on inhaled medications and proper administration.   Anatomy and Physiology of the Respiratory System Group instruction provided by PowerPoint, verbal discussion, and written material to support subject matter. Instructor reviews respiratory cycle and anatomical components of the respiratory system and their functions. Instructor also reviews differences in obstructive and restrictive respiratory diseases with examples of each.    Oxygen Safety Group instruction provided by PowerPoint, verbal discussion, and written material to support subject matter. There is an overview of "What is Oxygen" and "Why do we need it".  Instructor also reviews how to create a safe environment for oxygen use, the importance of using oxygen as prescribed, and the risks of noncompliance. There is a brief discussion on traveling with oxygen and resources the patient may utilize.   Oxygen Use Group instruction provided by PowerPoint, verbal discussion, and written material to discuss  how supplemental oxygen is prescribed and different types of oxygen supply systems. Resources for more information are provided.    Breathing Techniques Group instruction that is supported by demonstration and informational handouts. Instructor discusses the benefits of pursed lip and diaphragmatic breathing and detailed demonstration on how to perform both.     Risk Factor Reduction Group  instruction that is supported by a PowerPoint presentation. Instructor discusses the definition of a risk factor, different risk factors for pulmonary disease, and how the heart and lungs work together.   Pulmonary Diseases Group instruction provided by PowerPoint, verbal discussion, and written material to support subject matter. Instructor gives an overview of the different type of pulmonary diseases. There is also a discussion on risk factors and symptoms as well as ways to manage the diseases.   Stress and Energy Conservation Group instruction provided by PowerPoint, verbal discussion, and written material to support subject matter. Instructor gives an overview of stress and the impact it can have on the body. Instructor also reviews ways to reduce stress. There is also a discussion on energy conservation and ways to conserve energy throughout the day.   Warning Signs and Symptoms Group instruction provided by PowerPoint, verbal discussion, and written material to support subject matter. Instructor reviews warning signs and symptoms of stroke, heart attack, cold and flu. Instructor also reviews ways to prevent the spread of infection.   Other Education Group or individual verbal, written, or video instructions that support the educational goals of the pulmonary rehab program.    Knowledge Questionnaire Score:  Knowledge Questionnaire Score - 07/29/23 1436       Knowledge Questionnaire Score   Pre Score 14/18             Core Components/Risk Factors/Patient Goals at Admission:  Personal Goals and Risk Factors at Admission - 07/29/23 1338       Core Components/Risk Factors/Patient Goals on Admission    Weight Management Yes;Weight Loss    Intervention Weight Management: Develop a combined nutrition and exercise program designed to reach desired caloric intake, while maintaining appropriate intake of nutrient and fiber, sodium and fats, and appropriate energy expenditure  required for the weight goal.;Weight Management: Provide education and appropriate resources to help participant work on and attain dietary goals.;Weight Management/Obesity: Establish reasonable short term and long term weight goals.;Obesity: Provide education and appropriate resources to help participant work on and attain dietary goals.    Expected Outcomes Short Term: Continue to assess and modify interventions until short term weight is achieved;Long Term: Adherence to nutrition and physical activity/exercise program aimed toward attainment of established weight goal;Weight Maintenance: Understanding of the daily nutrition guidelines, which includes 25-35% calories from fat, 7% or less cal from saturated fats, less than 200mg  cholesterol, less than 1.5gm of sodium, & 5 or more servings of fruits and vegetables daily;Understanding recommendations for meals to include 15-35% energy as protein, 25-35% energy from fat, 35-60% energy from carbohydrates, less than 200mg  of dietary cholesterol, 20-35 gm of total fiber daily;Weight Loss: Understanding of general recommendations for a balanced deficit meal plan, which promotes 1-2 lb weight loss per week and includes a negative energy balance of [phone] kcal/d;Understanding of distribution of calorie intake throughout the day with the consumption of 4-5 meals/snacks    Improve shortness of breath with ADL's Yes    Intervention Provide education, individualized exercise plan and daily activity instruction to help decrease symptoms of SOB with activities of daily living.    Expected Outcomes Short Term: Improve  cardiorespiratory fitness to achieve a reduction of symptoms when performing ADLs;Long Term: Be able to perform more ADLs without symptoms or delay the onset of symptoms    Increase knowledge of respiratory medications and ability to use respiratory devices properly  Yes    Intervention Provide education and demonstration as needed of appropriate use of  medications, inhalers, and oxygen therapy.    Expected Outcomes Short Term: Achieves understanding of medications use. Understands that oxygen is a medication prescribed by physician. Demonstrates appropriate use of inhaler and oxygen therapy.;Long Term: Maintain appropriate use of medications, inhalers, and oxygen therapy.             Core Components/Risk Factors/Patient Goals Review:    Core Components/Risk Factors/Patient Goals at Discharge (Final Review):    ITP Comments: Dr. Mechele Collin is Medical Director for Pulmonary Rehab at [facility].

## 2023-07-29 NOTE — Progress Notes (Signed)
Austin [name] 82 y.o. male Pulmonary Rehab Orientation Note This patient who was referred to Pulmonary Rehab by Dr. [name] with the diagnosis of COPD stage 2 arrived today in Cardiac and Pulmonary Rehab. He arrived ambulatory with normal gait. He does not carry portable oxygen. Per patient, Austin [name] uses oxygen never. Color good, skin warm and dry. Patient is oriented to time and place. Patient's medical history, psychosocial health, and medications reviewed. Psychosocial assessment reveals patient lives with spouse. Austin [name] is currently retired. Patient hobbies include watching tv and spending time with others. Patient reports his stress level is low. Areas of stress/anxiety include N/A. Patient does not exhibit signs of depression. PHQ2/9 score 2/4. Austin [name] shows good  coping skills with positive outlook on life. Offered emotional support and reassurance. Will continue to monitor. Physical assessment performed by Austin Hart RN. Please see their orientation physical assessment note. Austin [name] reports he  does take medications as prescribed. Patient states he  follows a regular  diet. The patient has been trying to lose weight through a healthy diet and exercise program.. Patient's weight will be monitored closely. Demonstration and practice of PLB using pulse oximeter. Austin [name] able to return demonstration satisfactorily. Safety and hand hygiene in the exercise area reviewed with patient. Austin [name] voices understanding of the information reviewed. Department expectations discussed with patient and achievable goals were set. The patient shows enthusiasm about attending the program and we look forward to working with Austin [name]. Austin [name] completed a 6 min walk test today and is scheduled to begin exercise on 08/04/23 at 1:15 pm.   [id number] Austin San, MS, ACSM-CEP

## 2023-08-04 ENCOUNTER — Encounter (HOSPITAL_COMMUNITY)
Admission: RE | Admit: 2023-08-04 | Discharge: 2023-08-04 | Disposition: A | Discharge status: Home / Self Care | Payer: Medicare Other | Source: Ambulatory Visit | Attending: Internal Medicine | Admitting: Internal Medicine

## 2023-08-04 ENCOUNTER — Encounter (HOSPITAL_COMMUNITY): Payer: Medicare Other

## 2023-08-04 DIAGNOSIS — J449 Chronic obstructive pulmonary disease, unspecified: Secondary | ICD-10-CM

## 2023-08-04 NOTE — Progress Notes (Signed)
Daily Session Note  Patient Details  Name: Dom Pudwill MRN: [id number] Date of Birth: 1941/08/20 Referring Provider:   Doristine Devoid Pulmonary Rehab Walk Test from 07/29/2023 in [facility]  Referring Provider [name]       Encounter Date: 08/04/2023  Check In:  Session Check In - 08/04/23 1348       Check-In   Supervising physician immediately available to respond to emergencies [facility] MD immediately available    Physician(s) Eligha Bridegroom, NP    Location [facility]    Staff Present Raford Pitcher, MS, ACSM-CEP, Exercise Physiologist;Casey Synthia Innocent, RN, BSN;Samantha Belarus, RD, LDN;Alaia Lordi [facility], ACSM-CEP, Exercise Physiologist    Virtual Visit No    Medication changes reported     No    Fall or balance concerns reported    No    Tobacco Cessation No Change    Warm-up and Cool-down Performed as group-led instruction    Resistance Training Performed Yes    VAD Patient? No    PAD/SET Patient? No      Pain Assessment   Currently in Pain? No/denies    Multiple Pain Sites No             Capillary Blood Glucose: No results found for this or any previous visit (from the past 24 hour(s)).    Social History   Tobacco Use  Smoking Status Former   Types: Cigars   Quit date: 10/22/2019   Years since quitting: 3.7  Smokeless Tobacco Never  Tobacco Comments   smoked occ. with son. did not tolerate it long. 12/14/19.    Goals Met:  Exercise tolerated well No report of concerns or symptoms today Strength training completed today  Goals Unmet:  Not Applicable  Comments: Service time is from 1320 to 1452.  Dr. Mechele Collin is Medical Director for Pulmonary Rehab at [facility].

## 2023-08-08 ENCOUNTER — Other Ambulatory Visit (HOSPITAL_COMMUNITY): Payer: Self-pay

## 2023-08-08 ENCOUNTER — Telehealth: Payer: Self-pay | Admitting: Pharmacy Technician

## 2023-08-08 NOTE — Telephone Encounter (Signed)
Pharmacy Patient Advocate Encounter   Received notification from CoverMyMeds that prior authorization for Albuterol Sulfate (2.5 MG/3ML)0.083% nebulizer solution is required/requested.   Insurance verification completed.   The patient is insured through [facility] .   Per test claim: PA required; PA started via CoverMyMeds. KEY [id number] . Waiting for clinical questions to populate.

## 2023-08-08 NOTE — Progress Notes (Unsigned)
   Rubin Payor, PhD, LAT, ATC acting as a scribe for Clementeen Graham, MD.  Austin [name] is a 82 y.o. male who presents to [facility] at [facility] today for exacerbation of his R hip pain. Pt was last seen by Dr. [name] on 05/16/23 and was advised to cont HEP and advance activity as tolerated.   Today, pt reports ***  Dx imaging: 04/04/23 L-spine & R hip XR   Pertinent review of systems: ***  Relevant historical information: ***   Exam:  There were no vitals taken for this visit. General: Well Developed, well nourished, and in no acute distress.   MSK: ***    Lab and Radiology Results No results found for this or any previous visit (from the past 72 hour(s)). No results found.     Assessment and Plan: 82 y.o. male with ***   PDMP not reviewed this encounter. No orders of the defined types were placed in this encounter.  No orders of the defined types were placed in this encounter.    Discussed warning signs or symptoms. Please see discharge instructions. Patient expresses understanding.   ***

## 2023-08-09 ENCOUNTER — Encounter: Payer: Self-pay | Admitting: Family Medicine

## 2023-08-09 ENCOUNTER — Encounter (HOSPITAL_COMMUNITY): Payer: Medicare Other

## 2023-08-09 ENCOUNTER — Encounter (HOSPITAL_COMMUNITY)
Admission: RE | Admit: 2023-08-09 | Discharge: 2023-08-09 | Disposition: A | Discharge status: Home / Self Care | Payer: Medicare Other | Source: Ambulatory Visit | Attending: Internal Medicine

## 2023-08-09 ENCOUNTER — Ambulatory Visit (INDEPENDENT_AMBULATORY_CARE_PROVIDER_SITE_OTHER): Payer: Medicare Other

## 2023-08-09 ENCOUNTER — Ambulatory Visit (INDEPENDENT_AMBULATORY_CARE_PROVIDER_SITE_OTHER): Payer: Medicare Other | Admitting: Family Medicine

## 2023-08-09 VITALS — Wt 192.2 lb

## 2023-08-09 VITALS — BP 138/82 | HR 78 | Ht 69.0 in | Wt 191.0 lb

## 2023-08-09 DIAGNOSIS — M25552 Pain in left hip: Secondary | ICD-10-CM

## 2023-08-09 DIAGNOSIS — J449 Chronic obstructive pulmonary disease, unspecified: Secondary | ICD-10-CM | POA: Diagnosis not present

## 2023-08-09 NOTE — Telephone Encounter (Signed)
 Clinical questions answered and PA submitted

## 2023-08-09 NOTE — Progress Notes (Signed)
Daily Session Note  Patient Details  Name: Austin [name] MRN: [id number] Date of Birth: December 16, 1940 Referring Provider:   Doristine Devoid Pulmonary Rehab Walk Test from 07/29/2023 in [facility]  Referring Provider [name]       Encounter Date: 08/09/2023  Check In:  Session Check In - 08/09/23 1407       Check-In   Supervising physician immediately available to respond to emergencies [facility] MD immediately available    Physician(s) Robin Searing NP    Location [facility]    Staff Present Raford Pitcher, MS, ACSM-CEP, Exercise Physiologist;Casey Synthia Innocent, RN, BSN;Samantha Belarus, RD, LDN;Johnny Hale Bogus, MS, Exercise Physiologist    Virtual Visit No    Medication changes reported     No    Fall or balance concerns reported    No    Tobacco Cessation No Change    Warm-up and Cool-down Performed as group-led instruction    Resistance Training Performed Yes    VAD Patient? No    PAD/SET Patient? No      Pain Assessment   Currently in Pain? No/denies    Multiple Pain Sites No             Capillary Blood Glucose: No results found for this or any previous visit (from the past 24 hour(s)).   Exercise Prescription Changes - 08/09/23 1500       Response to Exercise   Blood Pressure (Admit) 148/70    Blood Pressure (Exercise) 154/72    Blood Pressure (Exit) 132/58    Heart Rate (Admit) 76 bpm    Heart Rate (Exercise) 104 bpm    Heart Rate (Exit) 89 bpm    Oxygen Saturation (Admit) 96 %    Oxygen Saturation (Exercise) 94 %    Oxygen Saturation (Exit) 94 %    Rating of Perceived Exertion (Exercise) 13    Perceived Dyspnea (Exercise) 2    Duration Continue with 30 min of aerobic exercise without signs/symptoms of physical distress.    Intensity THRR unchanged      Progression   Progression Continue to progress workloads to maintain intensity without signs/symptoms of physical distress.       Resistance Training   Training Prescription Yes    Weight blue bands    Reps 10-15    Time 10 Minutes      Treadmill   MPH 2    Grade 2    Minutes 15    METs 3.08      NuStep   Level 2    Minutes 15    METs 2.2             Social History   Tobacco Use  Smoking Status Former   Types: Cigars   Quit date: 10/22/2019   Years since quitting: 3.8  Smokeless Tobacco Never  Tobacco Comments   smoked occ. with son. did not tolerate it long. 12/14/19.    Goals Met:  Proper associated with RPD/PD & O2 Sat Exercise tolerated well No report of concerns or symptoms today Strength training completed today  Goals Unmet:  Not Applicable  Comments: Service time is from 1314 to 1440.    Dr. Mechele Collin is Medical Director for Pulmonary Rehab at [facility].

## 2023-08-09 NOTE — Patient Instructions (Addendum)
Thank you for coming in today.   Please get an Xray today before you leave  Try the exercises and offloading cushion for the ischial tuberosity  STANDing it band stretch.   Xray today.   Recheck in 1 month.    Ischial Tuberosity Seat Cushion with Two Holes for Sitting Bones-Washable & Breathable Cover Travelling,Reading,Home,Office, Yellow  [email or web address]&keywords=[email or web address]&qid=[phone]&sr=8-8

## 2023-08-10 NOTE — Progress Notes (Signed)
Pulmonary Individual Treatment Plan  Patient Details  Name: Austin [name] MRN: [id number] Date of Birth: 01-08-1941 Referring Provider:   Doristine Devoid Pulmonary Rehab Walk Test from 07/29/2023 in [facility]  Referring Provider [name]       Initial Encounter Date:  Flowsheet Row Pulmonary Rehab Walk Test from 07/29/2023 in [facility]  Date 07/29/23       Visit Diagnosis: Stage 2 moderate COPD by GOLD classification (HCC)  Patient's Home Medications on Admission:   Current Outpatient Medications:    albuterol (PROVENTIL) (2.5 MG/3ML) 0.083% nebulizer solution, USE 1 VIAL BY NEBULIZATION EVERY 4 HOURS AS NEEDED FOR WHEEZING OR SHORTNESS OF BREATH/DYSPNEA. J45.998, Disp: 375 mL, Rfl: 1   albuterol (VENTOLIN HFA) 108 (90 Base) MCG/ACT inhaler, INHALE 1-2 PUFFS BY MOUTH EVERY 6 HOURS AS NEEDED FOR WHEEZE OR SHORTNESS OF BREATH, Disp: 18 each, Rfl: 5   amLODipine (NORVASC) 5 MG tablet, TAKE 1 TABLET (5 MG TOTAL) BY MOUTH DAILY., Disp: 90 tablet, Rfl: 3   atorvastatin (LIPITOR) 80 MG tablet, TAKE 1 TABLET BY MOUTH EVERY DAY, Disp: 90 tablet, Rfl: 1   baclofen (LIORESAL) 10 MG tablet, Take 0.5-1 tablets (5-10 mg total) by mouth 4 (four) times daily as needed for muscle spasms., Disp: 30 each, Rfl: 0   BREZTRI AEROSPHERE 160-9-4.8 MCG/ACT AERO, INHALE 2 PUFFS INTO THE LUNGS IN THE MORNING AND AT BEDTIME., Disp: 10.7 each, Rfl: 1   Cyanocobalamin (VITAMIN B 12 PO), Take 1,000 Units by mouth., Disp: , Rfl:    fenofibrate micronized (LOFIBRA) 200 MG capsule, TAKE 1 CAPSULE BY MOUTH EVERY DAY, Disp: 90 capsule, Rfl: 0   gabapentin (NEURONTIN) 100 MG capsule, Take 1 capsule (100 mg total) by mouth at bedtime., Disp: 30 capsule, Rfl: 0   glucose blood (FREESTYLE LITE) test strip, CHECK BLOOD SUGAR THREE TIMES A DAY E11.9, Disp: 100 strip, Rfl: 12   ibuprofen (ADVIL) 800 MG tablet,  TAKE 1 TABLET BY MOUTH EVERY 8 HOURS AS NEEDED, Disp: 30 tablet, Rfl: 0   Lancets Misc. (UNISTIK 2 EXTRA) MISC, Use to check blood sugar 3 times daily/ Dx E11.9, Disp: 200 each, Rfl: 0   loratadine (CLARITIN) 10 MG tablet, TAKE 1 TABLET BY MOUTH EVERY DAY, Disp: 90 tablet, Rfl: 3   losartan (COZAAR) 100 MG tablet, TAKE 1 TABLET BY MOUTH EVERY DAY, Disp: 90 tablet, Rfl: 3   magnesium oxide (MAG-OX) 400 (240 Mg) MG tablet, TAKE 1 TABLET BY MOUTH EVERY DAY, Disp: 90 tablet, Rfl: 3   montelukast (SINGULAIR) 10 MG tablet, TAKE 1 TABLET BY MOUTH EVERYDAY AT BEDTIME, Disp: 90 tablet, Rfl: 1   nitrofurantoin, macrocrystal-monohydrate, (MACROBID) 100 MG capsule, Take 100 mg by mouth daily., Disp: , Rfl:    omeprazole (PRILOSEC) 20 MG capsule, TAKE 2 CAPSULES BY MOUTH EVERY DAY, Disp: 180 capsule, Rfl: 1   solifenacin (VESICARE) 5 MG tablet, Take 5 mg by mouth daily., Disp: , Rfl:    Spacer/Aero-Holding Chambers DEVI, 1 each by Does not apply route as directed., Disp: 1 each, Rfl: 1   Vitamin D, Cholecalciferol, 50 MCG (2000 UT) CAPS, Take by mouth., Disp: , Rfl:   Past Medical History: Past Medical History:  Diagnosis Date   Asthma    Depression    Hypertension    Prostate cancer (HCC)    Urinary incontinence     Tobacco Use: Social History   Tobacco Use  Smoking Status Former   Types: Cigars   Quit date: 10/22/2019   Years since quitting: 3.8  Smokeless Tobacco Never  Tobacco Comments   smoked occ. with son. did not tolerate it long. 12/14/19.    Labs: Review Flowsheet  More data exists      Latest Ref Rng & Units 02/10/2021 11/23/2021 04/23/2022 10/27/2022 03/31/2023  Labs for ITP Cardiac and Pulmonary Rehab  Cholestrol 0 - 200 mg/dL 528  413  244  010  272   LDL (calc) 0 - 99 mg/dL 90  90  536  85  91   HDL-C >39.00 mg/dL 64.40  34.74  25.95  63.87  49.80   Trlycerides 0.0 - 149.0 mg/dL 56.4  33.2  95.1  884.1  83.0   Hemoglobin A1c 4.0 - 5.6 % 5.7  6.2  6.1  6.4  5.8     Details             Capillary Blood Glucose: No results found for: "GLUCAP"   Pulmonary Assessment Scores:  Pulmonary Assessment Scores     Row Name 07/29/23 1341         ADL UCSD   ADL Phase Entry     SOB Score total 53       CAT Score   CAT Score 15       mMRC Score   mMRC Score 3             UCSD: Self-administered rating of dyspnea associated with activities of daily living (ADLs) 6-point scale (0 = "not at all" to 5 = "maximal or unable to do because of breathlessness")  Scoring Scores range from 0 to 120.  Minimally important difference is 5 units  CAT: CAT can identify the health impairment of COPD patients and is better correlated with disease progression.  CAT has a scoring range of zero to 40. The CAT score is classified into four groups of low (less than 10), medium (10 - 20), high (21-30) and very high (31-40) based on the impact level of disease on health status. A CAT score over 10 suggests significant symptoms.  A worsening CAT score could be explained by an exacerbation, poor medication adherence, poor inhaler technique, or progression of COPD or comorbid conditions.  CAT MCID is 2 points  mMRC: mMRC (Modified Medical Research Council) Dyspnea Scale is used to assess the degree of baseline functional disability in patients of respiratory disease due to dyspnea. No minimal important difference is established. A decrease in score of 1 point or greater is considered a positive change.   Pulmonary Function Assessment:  Pulmonary Function Assessment - 07/29/23 1341       Breath   Shortness of Breath Yes;Fear of Shortness of Breath;Limiting activity             Exercise Target Goals: Exercise Program Goal: Individual exercise prescription set using results from initial 6 min walk test and THRR while considering  patient's activity barriers and safety.   Exercise Prescription Goal: Initial exercise prescription builds to 30-45 minutes a day of aerobic  activity, 2-3 days per week.  Home exercise guidelines will be given to patient during program as part of exercise prescription that the participant will acknowledge.  Activity Barriers & Risk Stratification:  Activity Barriers & Cardiac Risk Stratification - 07/29/23 1343       Activity Barriers & Cardiac Risk Stratification   Activity Barriers Left Knee Replacement;Right Knee Replacement;Shortness of Breath;Muscular Weakness;Deconditioning;Back Problems;Balance Concerns;Arthritis  6 Minute Walk:  6 Minute Walk     Row Name 07/29/23 1427         6 Minute Walk   Phase Initial     Distance 1170 feet     Walk Time 6 minutes     # of Rest Breaks 0     MPH 2.22     METS 2.9     RPE 15     Perceived Dyspnea  2     VO2 Peak 10.14     Symptoms No     Resting HR 81 bpm     Resting BP 138/68     Resting Oxygen Saturation  96 %     Exercise Oxygen Saturation  during 6 min walk 90 %     Max Ex. HR 128 bpm     Max Ex. BP 198/80     2 Minute Post BP 140/74       Interval HR   1 Minute HR 104     2 Minute HR 113     3 Minute HR 115     4 Minute HR 121     5 Minute HR 124     6 Minute HR 128     2 Minute Post HR 98     Interval Heart Rate? Yes       Interval Oxygen   Interval Oxygen? Yes     Baseline Oxygen Saturation % 96 %     1 Minute Oxygen Saturation % 96 %     1 Minute Liters of Oxygen 0 L     2 Minute Oxygen Saturation % 91 %     2 Minute Liters of Oxygen 0 L     3 Minute Oxygen Saturation % 91 %     3 Minute Liters of Oxygen 0 L     4 Minute Oxygen Saturation % 91 %     4 Minute Liters of Oxygen 0 L     5 Minute Oxygen Saturation % 90 %     5 Minute Liters of Oxygen 0 L     6 Minute Oxygen Saturation % 90 %     6 Minute Liters of Oxygen 0 L     2 Minute Post Oxygen Saturation % 95 %     2 Minute Post Liters of Oxygen 0 L              Oxygen Initial Assessment:  Oxygen Initial Assessment - 07/29/23 1340       Home Oxygen   Home Oxygen  Device None    Sleep Oxygen Prescription None    Home Exercise Oxygen Prescription None    Home Resting Oxygen Prescription None      Initial 6 min Walk   Oxygen Used None      Program Oxygen Prescription   Program Oxygen Prescription None      Intervention   Short Term Goals To learn and understand importance of maintaining oxygen saturations>88%;To learn and demonstrate proper use of respiratory medications;To learn and understand importance of monitoring SPO2 with pulse oximeter and demonstrate accurate use of the pulse oximeter.;To learn and demonstrate proper pursed lip breathing techniques or other breathing techniques.     Long  Term Goals Verbalizes importance of monitoring SPO2 with pulse oximeter and return demonstration;Maintenance of O2 saturations>88%;Exhibits proper breathing techniques, such as pursed lip breathing or other method taught during program session;Compliance with respiratory medication;Demonstrates proper use of MDI's  Oxygen Re-Evaluation:  Oxygen Re-Evaluation     Row Name 08/01/23 1506             Program Oxygen Prescription   Program Oxygen Prescription None         Home Oxygen   Home Oxygen Device None       Sleep Oxygen Prescription None       Home Exercise Oxygen Prescription None       Home Resting Oxygen Prescription None         Goals/Expected Outcomes   Short Term Goals To learn and understand importance of maintaining oxygen saturations>88%;To learn and demonstrate proper use of respiratory medications;To learn and understand importance of monitoring SPO2 with pulse oximeter and demonstrate accurate use of the pulse oximeter.;To learn and demonstrate proper pursed lip breathing techniques or other breathing techniques.        Long  Term Goals Verbalizes importance of monitoring SPO2 with pulse oximeter and return demonstration;Maintenance of O2 saturations>88%;Exhibits proper breathing techniques, such as pursed lip breathing  or other method taught during program session;Compliance with respiratory medication;Demonstrates proper use of MDI's       Goals/Expected Outcomes Compliance and understanding of oxygen saturation monitoring and breathing techniques to decrease shortness of breath                Oxygen Discharge (Final Oxygen Re-Evaluation):  Oxygen Re-Evaluation - 08/01/23 1506       Program Oxygen Prescription   Program Oxygen Prescription None      Home Oxygen   Home Oxygen Device None    Sleep Oxygen Prescription None    Home Exercise Oxygen Prescription None    Home Resting Oxygen Prescription None      Goals/Expected Outcomes   Short Term Goals To learn and understand importance of maintaining oxygen saturations>88%;To learn and demonstrate proper use of respiratory medications;To learn and understand importance of monitoring SPO2 with pulse oximeter and demonstrate accurate use of the pulse oximeter.;To learn and demonstrate proper pursed lip breathing techniques or other breathing techniques.     Long  Term Goals Verbalizes importance of monitoring SPO2 with pulse oximeter and return demonstration;Maintenance of O2 saturations>88%;Exhibits proper breathing techniques, such as pursed lip breathing or other method taught during program session;Compliance with respiratory medication;Demonstrates proper use of MDI's    Goals/Expected Outcomes Compliance and understanding of oxygen saturation monitoring and breathing techniques to decrease shortness of breath             Initial Exercise Prescription:  Initial Exercise Prescription - 07/29/23 1400       Date of Initial Exercise RX and Referring Provider   Date 07/29/23    Referring Provider [name]    Expected Discharge Date 10/27/23      Treadmill   MPH 2    Grade 2    Minutes 15      NuStep   Level 2    SPM 60    Minutes 15      Prescription Details   Frequency (times per week) 2    Duration Progress to 30 minutes of  continuous aerobic without signs/symptoms of physical distress      Intensity   THRR 40-80% of Max Heartrate 55-110    Ratings of Perceived Exertion 11-13    Perceived Dyspnea 0-4      Progression   Progression Continue to progress workloads to maintain intensity without signs/symptoms of physical distress.      Resistance Training   Training Prescription Yes  Weight blue bands    Reps 10-15             Perform Capillary Blood Glucose checks as needed.  Exercise Prescription Changes:   Exercise Prescription Changes     Row Name 08/09/23 1500             Response to Exercise   Blood Pressure (Admit) 148/70       Blood Pressure (Exercise) 154/72       Blood Pressure (Exit) 132/58       Heart Rate (Admit) 76 bpm       Heart Rate (Exercise) 104 bpm       Heart Rate (Exit) 89 bpm       Oxygen Saturation (Admit) 96 %       Oxygen Saturation (Exercise) 94 %       Oxygen Saturation (Exit) 94 %       Rating of Perceived Exertion (Exercise) 13       Perceived Dyspnea (Exercise) 2       Duration Continue with 30 min of aerobic exercise without signs/symptoms of physical distress.       Intensity THRR unchanged         Progression   Progression Continue to progress workloads to maintain intensity without signs/symptoms of physical distress.         Resistance Training   Training Prescription Yes       Weight blue bands       Reps 10-15       Time 10 Minutes         Treadmill   MPH 2       Grade 2       Minutes 15       METs 3.08         NuStep   Level 2       Minutes 15       METs 2.2                Exercise Comments:   Exercise Comments     Row Name 08/04/23 1518           Exercise Comments Pt completed his first day of group exercise. He exercised on the treadmill for 15 min at 2.0 mph, incline 0. He then exercised on the recumbent stepper for 15 min, level 2, METs 2.6. He tolerated well. Performed warm up and cool down with verbal cues and  demonstrative cues. Blue bands were a challenge for his shoulders. Discussed METs with good reception.                Exercise Goals and Review:   Exercise Goals     Row Name 07/29/23 1343             Exercise Goals   Increase Physical Activity Yes       Intervention Provide advice, education, support and counseling about physical activity/exercise needs.;Develop an individualized exercise prescription for aerobic and resistive training based on initial evaluation findings, risk stratification, comorbidities and participant's personal goals.       Expected Outcomes Short Term: Attend rehab on a regular basis to increase amount of physical activity.;Long Term: Add in home exercise to make exercise part of routine and to increase amount of physical activity.;Long Term: Exercising regularly at least 3-5 days a week.       Increase Strength and Stamina Yes       Intervention Provide advice, education, support and counseling about  physical activity/exercise needs.;Develop an individualized exercise prescription for aerobic and resistive training based on initial evaluation findings, risk stratification, comorbidities and participant's personal goals.       Expected Outcomes Short Term: Increase workloads from initial exercise prescription for resistance, speed, and METs.;Short Term: Perform resistance training exercises routinely during rehab and add in resistance training at home;Long Term: Improve cardiorespiratory fitness, muscular endurance and strength as measured by increased METs and functional capacity ( )       Able to understand and use rate of perceived exertion (RPE) scale Yes       Intervention Provide education and explanation on how to use RPE scale       Expected Outcomes Short Term: Able to use RPE daily in rehab to express subjective intensity level;Long Term:  Able to use RPE to guide intensity level when exercising independently       Able to understand and use Dyspnea  scale Yes       Intervention Provide education and explanation on how to use Dyspnea scale       Expected Outcomes Short Term: Able to use Dyspnea scale daily in rehab to express subjective sense of shortness of breath during exertion;Long Term: Able to use Dyspnea scale to guide intensity level when exercising independently       Knowledge and understanding of Target Heart Rate Range (THRR) Yes       Intervention Provide education and explanation of THRR including how the numbers were predicted and where they are located for reference       Expected Outcomes Short Term: Able to state/look up THRR;Long Term: Able to use THRR to govern intensity when exercising independently;Short Term: Able to use daily as guideline for intensity in rehab       Understanding of Exercise Prescription Yes       Intervention Provide education, explanation, and written materials on patient's individual exercise prescription       Expected Outcomes Short Term: Able to explain program exercise prescription;Long Term: Able to explain home exercise prescription to exercise independently                Exercise Goals Re-Evaluation :  Exercise Goals Re-Evaluation     Row Name 08/01/23 1458             Exercise Goal Re-Evaluation   Exercise Goals Review Increase Physical Activity;Able to understand and use Dyspnea scale;Understanding of Exercise Prescription;Increase Strength and Stamina;Knowledge and understanding of Target Heart Rate Range (THRR);Able to understand and use rate of perceived exertion (RPE) scale       Comments Pt to begin group exercise on 08/04/23. Will progress as tolerated.       Expected Outcomes Through exercise at rehab and at home, the patient will decrease shortness of breath with daily activities and feel confident in carrying out an exercise regime at home.                Discharge Exercise Prescription (Final Exercise Prescription Changes):  Exercise Prescription Changes -  08/09/23 1500       Response to Exercise   Blood Pressure (Admit) 148/70    Blood Pressure (Exercise) 154/72    Blood Pressure (Exit) 132/58    Heart Rate (Admit) 76 bpm    Heart Rate (Exercise) 104 bpm    Heart Rate (Exit) 89 bpm    Oxygen Saturation (Admit) 96 %    Oxygen Saturation (Exercise) 94 %    Oxygen Saturation (Exit) 94 %  Rating of Perceived Exertion (Exercise) 13    Perceived Dyspnea (Exercise) 2    Duration Continue with 30 min of aerobic exercise without signs/symptoms of physical distress.    Intensity THRR unchanged      Progression   Progression Continue to progress workloads to maintain intensity without signs/symptoms of physical distress.      Resistance Training   Training Prescription Yes    Weight blue bands    Reps 10-15    Time 10 Minutes      Treadmill   MPH 2    Grade 2    Minutes 15    METs 3.08      NuStep   Level 2    Minutes 15    METs 2.2             Nutrition:  Target Goals: Understanding of nutrition guidelines, daily intake of sodium 1500mg , cholesterol 200mg , calories 30% from fat and 7% or less from saturated fats, daily to have 5 or more servings of fruits and vegetables.  Biometrics:    Nutrition Therapy Plan and Nutrition Goals:  Nutrition Therapy & Goals - 08/04/23 1401       Nutrition Therapy   Diet Heart Healthy Diet    Drug/Food Interactions Statins/Certain Fruits      Personal Nutrition Goals   Nutrition Goal Patient to improve diet quality by using the plate method as a guide for meal planning to include lean protein/plant protein, fruits, vegetables, whole grains, nonfat dairy as part of a well-balanced diet.    Personal Goal #2 Patient to identify strategies for weight loss of 0.5-2.0# per week.    Comments Patient has medical history of COPD stage2, HTN, dyslipidemia. Patient's A1c is in a prediabetic range and liver enzymes are elevated. RUQ ultrasound from 04/26/23 showed fatty liver disease; he does  report daily alcohol intake. He does report motivation to lose ~5-10# to aid with improving breathing. He enjoys a wide variety of foods. Patient will benefit from participation in pulmonary rehab for nutrition, exercise, and lifestyle modification.      Intervention Plan   Intervention Prescribe, educate and counsel regarding individualized specific dietary modifications aiming towards targeted core components such as weight, hypertension, lipid management, diabetes, heart failure and other comorbidities.;Nutrition handout(s) given to patient.    Expected Outcomes Short Term Goal: Understand basic principles of dietary content, such as calories, fat, sodium, cholesterol and nutrients.;Long Term Goal: Adherence to prescribed nutrition plan.             Nutrition Assessments:  Nutrition Assessments - 08/04/23 1506       Rate Your Plate Scores   Pre Score 55            MEDIFICTS Score Key: >=70 Need to make dietary changes  40-70 Heart Healthy Diet <= 40 Therapeutic Level Cholesterol Diet  Flowsheet Row PULMONARY REHAB CHRONIC OBSTRUCTIVE PULMONARY DISEASE from 08/04/2023 in [facility]  Picture Your Plate Total Score on Admission 55      Picture Your Plate Scores: <16 Unhealthy dietary pattern with much room for improvement. 41-50 Dietary pattern unlikely to meet recommendations for good health and room for improvement. 51-60 More healthful dietary pattern, with some room for improvement.  >60 Healthy dietary pattern, although there may be some specific behaviors that could be improved.    Nutrition Goals Re-Evaluation:  Nutrition Goals Re-Evaluation     Row Name 08/04/23 1401  Goals   Current Weight 193 lb 9 oz (87.8 kg)       Comment A1c 5.8, AST 40, ALT 58, lipids WNL, LDL 91 (lipitor, fenofibrate)       Expected Outcome Patient has medical history of COPD stage2, HTN, dyslipidemia. Patient's  A1c is in a prediabetic range and liver enzymes are elevated. RUQ ultrasound from 04/26/23 showed fatty liver disease; he does report daily alcohol intake. He does report motivation to lose ~5-10# to aid with improving breathing. He enjoys a wide variety of foods. Patient will benefit from participation in pulmonary rehab for nutrition, exercise, and lifestyle modification.                Nutrition Goals Discharge (Final Nutrition Goals Re-Evaluation):  Nutrition Goals Re-Evaluation - 08/04/23 1401       Goals   Current Weight 193 lb 9 oz (87.8 kg)    Comment A1c 5.8, AST 40, ALT 58, lipids WNL, LDL 91 (lipitor, fenofibrate)    Expected Outcome Patient has medical history of COPD stage2, HTN, dyslipidemia. Patient's A1c is in a prediabetic range and liver enzymes are elevated. RUQ ultrasound from 04/26/23 showed fatty liver disease; he does report daily alcohol intake. He does report motivation to lose ~5-10# to aid with improving breathing. He enjoys a wide variety of foods. Patient will benefit from participation in pulmonary rehab for nutrition, exercise, and lifestyle modification.             Psychosocial: Target Goals: Acknowledge presence or absence of significant depression and/or stress, maximize coping skills, provide positive support system. Participant is able to verbalize types and ability to use techniques and skills needed for reducing stress and depression.  Initial Review & Psychosocial Screening:  Initial Psych Review & Screening - 07/29/23 1333       Initial Review   Current issues with Current Sleep Concerns    Comments Pt has over the counter medication to help him fall asleep. Pt denies any psychosocial needs at this time.      Family Dynamics   Good Support System? Yes    Comments wife, son and daughter-in-law      Barriers   Psychosocial barriers to participate in program There are no identifiable barriers or psychosocial needs.      Screening  Interventions   Interventions Encouraged to exercise             Quality of Life Scores:  Scores of 19 and below usually indicate a poorer quality of life in these areas.  A difference of  2-3 points is a clinically meaningful difference.  A difference of 2-3 points in the total score of the Quality of Life Index has been associated with significant improvement in overall quality of life, self-image, physical symptoms, and general health in studies assessing change in quality of life.  PHQ-9: Review Flowsheet  More data exists      07/29/2023 03/31/2023 10/27/2022 09/21/2022 07/23/2022  Depression screen PHQ 2/9  Decreased Interest 1 0 0 0 0 0  Down, Depressed, Hopeless 1 0 0 0 0 0  PHQ - 2 Score 2 0 0 0 0 0  Altered sleeping 0 0 0 0 0  Tired, decreased energy 0 0 0 0 0  Change in appetite 0 0 0 0 0  Feeling bad or failure about yourself  1 0 0 0 0  Trouble concentrating 1 0 0 0 0  Moving slowly or fidgety/restless 0 0 0 0 0  Suicidal  thoughts 0 0 0 0 0  PHQ-9 Score 4 0 0 0 0  Difficult doing work/chores Somewhat difficult Not difficult at all Not difficult at all Not difficult at all Not difficult at all    Details       Multiple values from one day are sorted in reverse-chronological order        Interpretation of Total Score  Total Score Depression Severity:  1-4 = Minimal depression, 5-9 = Mild depression, 10-14 = Moderate depression, 15-19 = Moderately severe depression, 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    Expected Outcomes For Jondavid to participate in PR free of psychosocial concerns.    Continue Psychosocial Services  No Follow up required             Psychosocial Re-Evaluation:   Psychosocial Re-Evaluation     Row Name 08/05/23 1121             Psychosocial Re-Evaluation   Current issues with Current Sleep Concerns       Comments Majeed still endorses early morning fatigue but states he is taking sleeping meds to keep him asleep throughout the night. Encouraged pt to speak to MD if he is concerned or his sleep habits worsen. Atif denies any other psychosocial barriers or concern. Denies any needs at this time.       Expected Outcomes For Aadi is get more restful sleep and continue to attend PR without any other psychosocial barriers or concerns.       Interventions Encouraged to attend Pulmonary Rehabilitation for the exercise       Continue Psychosocial Services  No Follow up required                Psychosocial Discharge (Final Psychosocial Re-Evaluation):  Psychosocial Re-Evaluation - 08/05/23 1121       Psychosocial Re-Evaluation   Current issues with Current Sleep Concerns    Comments Jullius still endorses early morning fatigue but states he is taking sleeping meds to keep him asleep throughout the night. Encouraged pt to speak to MD if he is concerned or his sleep habits worsen. Montarius denies any other psychosocial barriers or concern. Denies any needs at this time.    Expected Outcomes For Bret is get more restful sleep and continue to attend PR without any other psychosocial barriers or concerns.    Interventions Encouraged to attend Pulmonary Rehabilitation for the exercise    Continue Psychosocial Services  No Follow up required             Education: Education Goals: Education classes will be provided on a weekly basis, covering required topics. Participant will state understanding/return demonstration of topics presented.  Learning Barriers/Preferences:  Learning Barriers/Preferences - 07/29/23 1337       Learning Barriers/Preferences   Learning Barriers Sight    Learning Preferences Skilled Demonstration              Education Topics: Know Your Numbers Group instruction that is supported by a PowerPoint presentation. Instructor discusses importance of knowing and understanding resting, exercise, and post-exercise oxygen saturation, heart rate, and blood pressure. Oxygen saturation, heart rate, blood pressure, rating of perceived exertion, and dyspnea are reviewed along with a normal range for these values.  Exercise for the Pulmonary Patient Group instruction that is supported by a PowerPoint presentation. Instructor discusses benefits of exercise, core components of exercise, frequency, duration, and intensity of an exercise routine, importance of utilizing pulse oximetry during exercise, safety while exercising, and options of places to exercise outside of rehab.    MET Level  Group instruction provided by PowerPoint, verbal discussion, and written material to support subject matter. Instructor reviews what METs are and how to increase METs.    Pulmonary Medications Verbally interactive group education provided by instructor with focus on inhaled medications and proper administration.   Anatomy and Physiology of the Respiratory System Group instruction provided by PowerPoint, verbal discussion, and written material to support subject matter. Instructor reviews respiratory cycle and anatomical components of the respiratory system and their functions. Instructor also reviews differences in obstructive and restrictive respiratory diseases with examples of each.    Oxygen Safety Group instruction provided by PowerPoint, verbal discussion, and written material to support subject matter. There is an overview of "What is Oxygen" and "Why do we need it".  Instructor also reviews how to create a safe environment for oxygen use, the importance of using oxygen as prescribed, and the risks of noncompliance. There is a brief discussion on traveling with oxygen and resources the patient may utilize.   Oxygen  Use Group instruction provided by PowerPoint, verbal discussion, and written material to discuss how supplemental oxygen is prescribed and different types of oxygen supply systems. Resources for more information are provided.    Breathing Techniques Group instruction that is supported by demonstration and informational handouts. Instructor discusses the benefits of pursed lip and diaphragmatic breathing and detailed demonstration on how to perform both.  Flowsheet Row PULMONARY REHAB CHRONIC OBSTRUCTIVE PULMONARY DISEASE from 08/04/2023 in [facility] for Heart, Vascular, & Lung Health  Date 08/04/23  Educator RN  Instruction Review Code 1- Verbalizes Understanding        Risk Factor Reduction Group instruction that is supported by a PowerPoint presentation. Instructor discusses the definition of a risk factor, different risk factors for pulmonary disease, and how the heart and lungs work together.   Pulmonary Diseases Group instruction provided by PowerPoint, verbal discussion, and written material to support subject matter. Instructor gives an overview of the different type of pulmonary diseases. There is also a discussion on risk factors and symptoms as well as ways to manage the diseases.   Stress and Energy Conservation Group instruction provided by PowerPoint, verbal discussion, and written material to support subject matter. Instructor gives an overview of stress and the impact it can have on the body. Instructor also reviews ways to reduce stress. There is also a discussion on energy conservation and ways to conserve energy throughout the day.   Warning Signs and Symptoms Group instruction provided by PowerPoint, verbal discussion, and written material to support subject matter. Instructor reviews warning signs and symptoms of stroke, heart attack, cold and flu. Instructor also reviews ways to prevent the spread of infection.   Other Education Group or  individual verbal, written, or video instructions that support the educational goals of the pulmonary rehab program.    Knowledge Questionnaire Score:  Knowledge Questionnaire Score - 07/29/23 1436       Knowledge Questionnaire Score   Pre Score 14/18             Core Components/Risk Factors/Patient Goals at Admission:  Personal Goals and Risk Factors at Admission - 07/29/23 1338  Core Components/Risk Factors/Patient Goals on Admission    Weight Management Yes;Weight Loss    Intervention Weight Management: Develop a combined nutrition and exercise program designed to reach desired caloric intake, while maintaining appropriate intake of nutrient and fiber, sodium and fats, and appropriate energy expenditure required for the weight goal.;Weight Management: Provide education and appropriate resources to help participant work on and attain dietary goals.;Weight Management/Obesity: Establish reasonable short term and long term weight goals.;Obesity: Provide education and appropriate resources to help participant work on and attain dietary goals.    Expected Outcomes Short Term: Continue to assess and modify interventions until short term weight is achieved;Long Term: Adherence to nutrition and physical activity/exercise program aimed toward attainment of established weight goal;Weight Maintenance: Understanding of the daily nutrition guidelines, which includes 25-35% calories from fat, 7% or less cal from saturated fats, less than 200mg  cholesterol, less than 1.5gm of sodium, & 5 or more servings of fruits and vegetables daily;Understanding recommendations for meals to include 15-35% energy as protein, 25-35% energy from fat, 35-60% energy from carbohydrates, less than 200mg  of dietary cholesterol, 20-35 gm of total fiber daily;Weight Loss: Understanding of general recommendations for a balanced deficit meal plan, which promotes 1-2 lb weight loss per week and includes a negative energy  balance of [id number] kcal/d;Understanding of distribution of calorie intake throughout the day with the consumption of 4-5 meals/snacks    Improve shortness of breath with ADL's Yes    Intervention Provide education, individualized exercise plan and daily activity instruction to help decrease symptoms of SOB with activities of daily living.    Expected Outcomes Short Term: Improve cardiorespiratory fitness to achieve a reduction of symptoms when performing ADLs;Long Term: Be able to perform more ADLs without symptoms or delay the onset of symptoms    Increase knowledge of respiratory medications and ability to use respiratory devices properly  Yes    Intervention Provide education and demonstration as needed of appropriate use of medications, inhalers, and oxygen therapy.    Expected Outcomes Short Term: Achieves understanding of medications use. Understands that oxygen is a medication prescribed by physician. Demonstrates appropriate use of inhaler and oxygen therapy.;Long Term: Maintain appropriate use of medications, inhalers, and oxygen therapy.             Core Components/Risk Factors/Patient Goals Review:   Goals and Risk Factor Review     Row Name 08/05/23 1126             Core Components/Risk Factors/Patient Goals Review   Personal Goals Review Weight Management/Obesity;Improve shortness of breath with ADL's;Develop more efficient breathing techniques such as purse lipped breathing and diaphragmatic breathing and practicing self-pacing with activity.;Increase knowledge of respiratory medications and ability to use respiratory devices properly.       Review Unable to assess goals. Josua has attended 1 PR session so far. He will continue to benefit from PR for nutrition, education, exercise, and lifestyle modification.       Expected Outcomes For Anik to improve shortness of breath with ADL's, increase knowledge of respiratory medications and ability to use respiratory devices  properly, to lose weight and develop more efficient breathing techniques such as purse lipped breathing and diaphragmatic breathing; and practicing self-pacing with activity                Core Components/Risk Factors/Patient Goals at Discharge (Final Review):   Goals and Risk Factor Review - 08/05/23 1126       Core Components/Risk Factors/Patient Goals Review   Personal  Goals Review Weight Management/Obesity;Improve shortness of breath with ADL's;Develop more efficient breathing techniques such as purse lipped breathing and diaphragmatic breathing and practicing self-pacing with activity.;Increase knowledge of respiratory medications and ability to use respiratory devices properly.    Review Unable to assess goals. Tywaun has attended 1 PR session so far. He will continue to benefit from PR for nutrition, education, exercise, and lifestyle modification.    Expected Outcomes For Tavon to improve shortness of breath with ADL's, increase knowledge of respiratory medications and ability to use respiratory devices properly, to lose weight and develop more efficient breathing techniques such as purse lipped breathing and diaphragmatic breathing; and practicing self-pacing with activity             ITP Comments:   Comments: Pt is making expected progress toward Pulmonary Rehab goals after completing 2 session(s). Recommend continued exercise, life style modification, education, and utilization of breathing techniques to increase stamina and strength, while also decreasing shortness of breath with exertion.  Dr. Mechele Collin is Medical Director for Pulmonary Rehab at [facility].

## 2023-08-11 ENCOUNTER — Encounter (HOSPITAL_COMMUNITY): Payer: Medicare Other

## 2023-08-11 ENCOUNTER — Encounter (HOSPITAL_COMMUNITY)
Admission: RE | Admit: 2023-08-11 | Discharge: 2023-08-11 | Disposition: A | Discharge status: Home / Self Care | Payer: Medicare Other | Source: Ambulatory Visit | Attending: Internal Medicine | Admitting: Internal Medicine

## 2023-08-11 DIAGNOSIS — J449 Chronic obstructive pulmonary disease, unspecified: Secondary | ICD-10-CM | POA: Diagnosis not present

## 2023-08-11 NOTE — Progress Notes (Signed)
Daily Session Note  Patient Details  Name: Austin [name] MRN: [id number] Date of Birth: Dec 09, 1940 Referring Provider:   Doristine Devoid Pulmonary Rehab Walk Test from 07/29/2023 in [facility] for Heart, Vascular, & Lung Health  Referring Provider [name]       Encounter Date: 08/11/2023  Check In:  Session Check In - 08/11/23 1343       Check-In   Supervising physician immediately available to respond to emergencies [facility] MD immediately available    Physician(s) Carlyon Shadow, NP    Location [facility]    Staff Present Raford Pitcher, MS, ACSM-CEP, Exercise Physiologist;Casey Synthia Innocent, RN, BSN;Samantha Belarus, RD, LDN;Johnny Hale Bogus, MS, Exercise Physiologist    Virtual Visit No    Medication changes reported     No    Fall or balance concerns reported    No    Tobacco Cessation No Change    Warm-up and Cool-down Performed as group-led instruction    Resistance Training Performed Yes    VAD Patient? No    PAD/SET Patient? No      Pain Assessment   Currently in Pain? No/denies    Multiple Pain Sites No             Capillary Blood Glucose: No results found for this or any previous visit (from the past 24 hour(s)).    Social History   Tobacco Use  Smoking Status Former   Types: Cigars   Quit date: 10/22/2019   Years since quitting: 3.8  Smokeless Tobacco Never  Tobacco Comments   smoked occ. with son. did not tolerate it long. 12/14/19.    Goals Met:  Proper associated with RPD/PD & O2 Sat Exercise tolerated well No report of concerns or symptoms today Strength training completed today  Goals Unmet:  Not Applicable  Comments: Service time is from 1311 to 1412.    Dr. Mechele Collin is Medical Director for Pulmonary Rehab at [facility].

## 2023-08-11 NOTE — Telephone Encounter (Signed)
Pharmacy Patient Advocate Encounter  Received notification from [facility] that Prior Authorization for Albuterol Sulfate neb solution has been DENIED.  Full denial letter will be uploaded to the media tab. See denial reason below.   PA #/Case ID/Reference #: [id number]    Placed a call to [facility] and spoke with the pharmacist and asked for them to bill Medicare Part B for the nebulizer solution.

## 2023-08-12 ENCOUNTER — Ambulatory Visit (INDEPENDENT_AMBULATORY_CARE_PROVIDER_SITE_OTHER): Payer: Medicare Other | Admitting: Family Medicine

## 2023-08-12 ENCOUNTER — Encounter: Payer: Self-pay | Admitting: Family Medicine

## 2023-08-12 VITALS — BP 150/80 | HR 82 | Temp 98.4°F | Resp 18 | Ht 69.0 in | Wt 191.5 lb

## 2023-08-12 DIAGNOSIS — R110 Nausea: Secondary | ICD-10-CM | POA: Diagnosis not present

## 2023-08-12 DIAGNOSIS — Z7184 Encounter for health counseling related to travel: Secondary | ICD-10-CM

## 2023-08-12 MED ORDER — ONDANSETRON HCL 4 MG PO TABS: 4.0000 mg | ORAL_TABLET | Freq: Three times a day (TID) | ORAL | 0 refills | Status: DC | PRN

## 2023-08-12 NOTE — Progress Notes (Signed)
Subjective:   Patient ID: Austin [name], male DOB: 1940/11/10, 82 y.o. MRN: [id number]  Chief Complaint  Patient presents with   New Med Request    Going on a boat and need medication for sea sickness    HPI Sea Sickness - Reports that he's going to be going on a cruise soon and would like some medication to manage his sx. States that his son recently went on a ship and had Scopolamine patches - he would like to discuss getting a prescription for himself. Notes he does use sea bands before getting on a boat. Denies hx of migraines or car sickness, and glaucoma.  Denies ever having experienced sea sickness but wears the sea bands.  Health Maintenance Due  Topic Date Due   Medicare Annual Wellness (AWV)  09/22/2023   Past Medical History:  Diagnosis Date   Asthma    Depression    Hypertension    Prostate cancer [facility])    Urinary incontinence    Past Surgical History:  Procedure Laterality Date   GALLBLADDER SURGERY  1997   REPLACEMENT TOTAL KNEE BILATERAL  2001   2007   surgery replacement Bilateral 2005   TOTAL SHOULDER REPLACEMENT Bilateral 2000   Current Outpatient Medications:    albuterol (PROVENTIL) (2.5 MG/3ML) 0.083% nebulizer solution, USE 1 VIAL BY NEBULIZATION EVERY 4 HOURS AS NEEDED FOR WHEEZING OR SHORTNESS OF BREATH/DYSPNEA. J45.998, Disp: 375 mL, Rfl: 1   albuterol (VENTOLIN HFA) 108 (90 Base) MCG/ACT inhaler, INHALE 1-2 PUFFS BY MOUTH EVERY 6 HOURS AS NEEDED FOR WHEEZE OR SHORTNESS OF BREATH, Disp: 18 each, Rfl: 5   amLODipine (NORVASC) 5 MG tablet, TAKE 1 TABLET (5 MG TOTAL) BY MOUTH DAILY., Disp: 90 tablet, Rfl: 3   atorvastatin (LIPITOR) 80 MG tablet, TAKE 1 TABLET BY MOUTH EVERY DAY, Disp: 90 tablet, Rfl: 1   baclofen (LIORESAL) 10 MG tablet, Take 0.5-1 tablets (5-10 mg total) by mouth 4 (four) times daily as needed for muscle spasms., Disp: 30 each, Rfl: 0   BREZTRI AEROSPHERE 160-9-4.8 MCG/ACT AERO, INHALE 2 PUFFS INTO THE LUNGS IN THE MORNING AND AT  BEDTIME., Disp: 10.7 each, Rfl: 1   Cyanocobalamin (VITAMIN B 12 PO), Take 1,000 Units by mouth., Disp: , Rfl:    fenofibrate micronized (LOFIBRA) 200 MG capsule, TAKE 1 CAPSULE BY MOUTH EVERY DAY, Disp: 90 capsule, Rfl: 0   gabapentin (NEURONTIN) 100 MG capsule, Take 1 capsule (100 mg total) by mouth at bedtime., Disp: 30 capsule, Rfl: 0   glucose blood (FREESTYLE LITE) test strip, CHECK BLOOD SUGAR THREE TIMES A DAY E11.9, Disp: 100 strip, Rfl: 12   ibuprofen (ADVIL) 800 MG tablet, TAKE 1 TABLET BY MOUTH EVERY 8 HOURS AS NEEDED, Disp: 30 tablet, Rfl: 0   Lancets Misc. (UNISTIK 2 EXTRA) MISC, Use to check blood sugar 3 times daily/ Dx E11.9, Disp: 200 each, Rfl: 0   loratadine (CLARITIN) 10 MG tablet, TAKE 1 TABLET BY MOUTH EVERY DAY, Disp: 90 tablet, Rfl: 3   losartan (COZAAR) 100 MG tablet, TAKE 1 TABLET BY MOUTH EVERY DAY, Disp: 90 tablet, Rfl: 3   magnesium oxide (MAG-OX) 400 (240 Mg) MG tablet, TAKE 1 TABLET BY MOUTH EVERY DAY, Disp: 90 tablet, Rfl: 3   montelukast (SINGULAIR) 10 MG tablet, TAKE 1 TABLET BY MOUTH EVERYDAY AT BEDTIME, Disp: 90 tablet, Rfl: 1   nitrofurantoin, macrocrystal-monohydrate, (MACROBID) 100 MG capsule, Take 100 mg by mouth daily., Disp: , Rfl:    omeprazole (PRILOSEC) 20 MG capsule, TAKE  2 CAPSULES BY MOUTH EVERY DAY, Disp: 180 capsule, Rfl: 1   ondansetron (ZOFRAN) 4 MG tablet, Take 1 tablet (4 mg total) by mouth every 8 (eight) hours as needed for nausea or vomiting., Disp: 20 tablet, Rfl: 0   solifenacin (VESICARE) 5 MG tablet, Take 5 mg by mouth daily., Disp: , Rfl:    Spacer/Aero-Holding Chambers DEVI, 1 each by Does not apply route as directed., Disp: 1 each, Rfl: 1   Vitamin D, Cholecalciferol, 50 MCG (2000 UT) CAPS, Take by mouth., Disp: , Rfl:   No Known Allergies ROS neg/noncontributory except as noted HPI/below  Objective:  BP (!) 150/80   Pulse 82   Temp 98.4 F (36.9 C) (Temporal)   Resp 18   Ht 5\' 9"  (1.753 m)   Wt 191 lb 8 oz (86.9 kg)   SpO2  96%   BMI 28.28 kg/m  Wt Readings from Last 3 Encounters:  08/12/23 191 lb 8 oz (86.9 kg)  08/09/23 192 lb 3.9 oz (87.2 kg)  08/09/23 191 lb (86.6 kg)   Physical Exam  Gen: WDWN NAD HEENT: NCAT, conjunctiva not injected, sclera nonicteric MSK: no gross abnormalities.  NEURO: A&O x3.  CN II-XII intact.  PSYCH: normal mood. Good eye contact Assessment & Plan:  Nausea  Encounter for health counseling related to travel  Other orders -     Ondansetron HCl; Take 1 tablet (4 mg total) by mouth every 8 (eight) hours as needed for nausea or vomiting.  Dispense: 20 tablet; Refill: 0  1.  Nausea-prescription written for Zofran 4 mg p.o. every 8 hours as needed for nausea 2.  Counseling for seasickness-discussed medications that are used to prevent seasickness.  However, given age and comorbidities, advised against scopolamine patches, Dramamine, meclizine.  Advised to use the sea bands, ginger, stay hydrated, peppermint oil, etc.  Return if symptoms worsen or fail to improve.   I,Emily Lagle,acting as a Neurosurgeon for Angelena Sole, MD.,have documented all relevant documentation on the behalf of Angelena Sole, MD,as directed by  Angelena Sole, MD while in the presence of Angelena Sole, MD.  I, Angelena Sole, MD, have reviewed all documentation for this visit. The documentation on 08/12/23 for the exam, diagnosis, procedures, and orders are all accurate and complete.   Angelena Sole, MD

## 2023-08-12 NOTE — Patient Instructions (Signed)
Sea bands Ginger-crystallized, chews, gum.   Ginger ale  Drink plenty of water.    Look at horizon.  Ondasterone for nausea

## 2023-08-16 ENCOUNTER — Encounter (HOSPITAL_COMMUNITY)
Admission: RE | Admit: 2023-08-16 | Discharge: 2023-08-16 | Disposition: A | Discharge status: Home / Self Care | Payer: Medicare Other | Source: Ambulatory Visit | Attending: Internal Medicine | Admitting: Internal Medicine

## 2023-08-16 ENCOUNTER — Encounter (HOSPITAL_COMMUNITY): Payer: Medicare Other

## 2023-08-16 DIAGNOSIS — J449 Chronic obstructive pulmonary disease, unspecified: Secondary | ICD-10-CM | POA: Diagnosis present

## 2023-08-16 DIAGNOSIS — Z87891 Personal history of nicotine dependence: Secondary | ICD-10-CM | POA: Insufficient documentation

## 2023-08-16 DIAGNOSIS — Z5189 Encounter for other specified aftercare: Secondary | ICD-10-CM | POA: Diagnosis present

## 2023-08-16 NOTE — Progress Notes (Signed)
Daily Session Note  Patient Details  Name: Austin [name] MRN: [id number] Date of Birth: Sep 23, 1941 Referring Provider:   Doristine Devoid Pulmonary Rehab Walk Test from 07/29/2023 in [facility] for Heart, Vascular, & Lung Health  Referring Provider [name]       Encounter Date: 08/16/2023  Check In:  Session Check In - 08/16/23 1408       Check-In   Supervising physician immediately available to respond to emergencies [facility] MD immediately available    Physician(s) Carlyon Shadow, NP    Location [facility]    Staff Present Raford Pitcher, MS, ACSM-CEP, Exercise Physiologist;Casey Synthia Innocent, RN, BSN;Samantha Belarus, RD, LDN;Randi [facility], ACSM-CEP, Exercise Physiologist    Virtual Visit No    Medication changes reported     No    Fall or balance concerns reported    No    Tobacco Cessation No Change    Warm-up and Cool-down Performed as group-led instruction    Resistance Training Performed Yes    VAD Patient? No    PAD/SET Patient? No      Pain Assessment   Currently in Pain? No/denies    Multiple Pain Sites No             Capillary Blood Glucose: No results found for this or any previous visit (from the past 24 hour(s)).    Social History   Tobacco Use  Smoking Status Former   Types: Cigars   Quit date: 10/22/2019   Years since quitting: 3.8  Smokeless Tobacco Never  Tobacco Comments   smoked occ. with son. did not tolerate it long. 12/14/19.    Goals Met:  Independence with exercise equipment Exercise tolerated well No report of concerns or symptoms today Strength training completed today  Goals Unmet:  Not Applicable  Comments: Service time is from 1308 to 1442    Dr. Mechele Collin is Medical Director for Pulmonary Rehab at [facility].

## 2023-08-17 ENCOUNTER — Telehealth (HOSPITAL_COMMUNITY): Payer: Self-pay

## 2023-08-17 NOTE — Telephone Encounter (Signed)
-----   Message from Charlott Holler sent at 08/16/2023  3:49 PM EST ----- Regarding: RE: Increase in THR for Pulm Rehab Ok to increase THR to 125. ----- Message ----- From: Essie Hart, RN Sent: 08/16/2023   3:20 PM EST To: Charlott Holler, MD Subject: Increase in THR for Pulm Rehab                 Can I please get approval to increase pt's THR from 110 to 125? Pt has been using his albuterol inhaler before or during class, heart rate with exercise in the 120s. VS have been WNL and he is tolerating exercise on RA.

## 2023-08-18 ENCOUNTER — Encounter (HOSPITAL_COMMUNITY)
Admission: RE | Admit: 2023-08-18 | Discharge: 2023-08-18 | Disposition: A | Discharge status: Home / Self Care | Payer: Medicare Other | Source: Ambulatory Visit | Attending: Internal Medicine | Admitting: Internal Medicine

## 2023-08-18 ENCOUNTER — Encounter (HOSPITAL_COMMUNITY): Payer: Medicare Other

## 2023-08-18 VITALS — Wt 188.5 lb

## 2023-08-18 DIAGNOSIS — Z5189 Encounter for other specified aftercare: Secondary | ICD-10-CM | POA: Diagnosis not present

## 2023-08-18 DIAGNOSIS — J449 Chronic obstructive pulmonary disease, unspecified: Secondary | ICD-10-CM

## 2023-08-18 NOTE — Progress Notes (Signed)
Daily Session Note  Patient Details  Name: Austin [name] MRN: [id number] Date of Birth: 1941-08-17 Referring Provider:   Doristine Devoid Pulmonary Rehab Walk Test from 07/29/2023 in [facility] for Heart, Vascular, & Lung Health  Referring Provider [name]       Encounter Date: 08/18/2023  Check In:  Session Check In - 08/18/23 1327       Check-In   Supervising physician immediately available to respond to emergencies [facility] MD immediately available    Physician(s) Robin Searing, NP    Location [facility]    Staff Present Raford Pitcher, MS, ACSM-CEP, Exercise Physiologist;Jaycie Kregel Synthia Innocent, RN, BSN;Randi Reeve BS, ACSM-CEP, Exercise Physiologist    Virtual Visit No    Medication changes reported     No    Fall or balance concerns reported    No    Tobacco Cessation No Change    Warm-up and Cool-down Performed as group-led instruction    Resistance Training Performed Yes    VAD Patient? No    PAD/SET Patient? No      Pain Assessment   Currently in Pain? No/denies    Multiple Pain Sites No             Capillary Blood Glucose: No results found for this or any previous visit (from the past 24 hour(s)).    Social History   Tobacco Use  Smoking Status Former   Types: Cigars   Quit date: 10/22/2019   Years since quitting: 3.8  Smokeless Tobacco Never  Tobacco Comments   smoked occ. with son. did not tolerate it long. 12/14/19.    Goals Met:  Proper associated with RPD/PD & O2 Sat Independence with exercise equipment Exercise tolerated well No report of concerns or symptoms today Strength training completed today  Goals Unmet:  Not Applicable  Comments: Service time is from 1301 to 1437.     Dr. Mechele Collin is Medical Director for Pulmonary Rehab at [facility].

## 2023-08-23 ENCOUNTER — Encounter (HOSPITAL_COMMUNITY): Payer: Medicare Other

## 2023-08-25 ENCOUNTER — Encounter (HOSPITAL_COMMUNITY): Payer: Medicare Other

## 2023-08-25 ENCOUNTER — Telehealth (HOSPITAL_COMMUNITY): Payer: Self-pay

## 2023-08-25 NOTE — Telephone Encounter (Signed)
Called to check on [place]. Left a message with call back number.

## 2023-08-30 ENCOUNTER — Encounter (HOSPITAL_COMMUNITY)
Admission: RE | Admit: 2023-08-30 | Discharge: 2023-08-30 | Disposition: A | Discharge status: Home / Self Care | Payer: Medicare Other | Source: Ambulatory Visit | Attending: Internal Medicine | Admitting: Internal Medicine

## 2023-08-30 ENCOUNTER — Encounter (HOSPITAL_COMMUNITY): Payer: Medicare Other

## 2023-08-30 DIAGNOSIS — Z5189 Encounter for other specified aftercare: Secondary | ICD-10-CM | POA: Diagnosis not present

## 2023-08-30 DIAGNOSIS — J449 Chronic obstructive pulmonary disease, unspecified: Secondary | ICD-10-CM

## 2023-08-30 NOTE — Progress Notes (Signed)
Daily Session Note  Patient Details  Name: Austin [name] MRN: [id number] Date of Birth: 1941/04/06 Referring Provider:   Doristine Devoid Pulmonary Rehab Walk Test from 07/29/2023 in [facility]  Referring Provider [name]       Encounter Date: 08/30/2023  Check In:  Session Check In - 08/30/23 1322       Check-In   Supervising physician immediately available to respond to emergencies [facility] MD immediately available    Physician(s) Bernadene Person, NP    Location [facility]    Staff Present Raford Pitcher, MS, ACSM-CEP, Exercise Physiologist;Casey Synthia Innocent, RN, BSN;Randi Reeve BS, ACSM-CEP, Exercise Physiologist;Samantha Belarus, RD, LDN    Virtual Visit No    Medication changes reported     No    Fall or balance concerns reported    No    Tobacco Cessation No Change    Warm-up and Cool-down Performed as group-led instruction    Resistance Training Performed Yes    VAD Patient? No    PAD/SET Patient? No      Pain Assessment   Currently in Pain? No/denies    Multiple Pain Sites No             Capillary Blood Glucose: No results found for this or any previous visit (from the past 24 hour(s)).    Social History   Tobacco Use  Smoking Status Former   Types: Cigars   Quit date: 10/22/2019   Years since quitting: 3.8  Smokeless Tobacco Never  Tobacco Comments   smoked occ. with son. did not tolerate it long. 12/14/19.    Goals Met:  Proper associated with RPD/PD & O2 Sat Exercise tolerated well No report of concerns or symptoms today Strength training completed today  Goals Unmet:  Not Applicable  Comments: Service time is from 1310 to 1436.    Dr. Mechele Collin is Medical Director for Pulmonary Rehab at [facility].

## 2023-09-01 ENCOUNTER — Encounter (HOSPITAL_COMMUNITY): Payer: Medicare Other

## 2023-09-01 ENCOUNTER — Encounter (HOSPITAL_COMMUNITY)
Admission: RE | Admit: 2023-09-01 | Discharge: 2023-09-01 | Disposition: A | Discharge status: Home / Self Care | Payer: Medicare Other | Source: Ambulatory Visit | Attending: Internal Medicine | Admitting: Internal Medicine

## 2023-09-01 DIAGNOSIS — J449 Chronic obstructive pulmonary disease, unspecified: Secondary | ICD-10-CM

## 2023-09-01 NOTE — Progress Notes (Signed)
Daily Session Note  Patient Details  Name: Austin [name] MRN: [id number] Date of Birth: [birth year] Referring Provider:   Doristine Devoid Pulmonary Rehab Walk Test from 07/29/2023 in [facility]  Referring Provider [name]       Encounter Date: 09/01/2023  Check In:  Session Check In - 09/01/23 1323       Check-In   Supervising physician immediately available to respond to emergencies [facility] MD immediately available    Physician(s) Reather Littler, NP    [facility]    Staff Present Raford Pitcher, MS, ACSM-CEP, Exercise Physiologist;Casey Katrinka Blazing, Patriciaann Clan, RN, BSN;Randi Reeve BS, ACSM-CEP, Exercise Physiologist;Samantha Belarus, RD, LDN    Virtual Visit No    Medication changes reported     No    Fall or balance concerns reported    No    Tobacco Cessation No Change    Warm-up and Cool-down Performed as group-led instruction    Resistance Training Performed Yes    VAD Patient? No    PAD/SET Patient? No      Pain Assessment   Currently in Pain? No/denies    Multiple Pain Sites No             Capillary Blood Glucose: No results found for this or any previous visit (from the past 24 hour(s)).   Exercise Prescription Changes - 09/01/23 1400       Home Exercise Plan   Plans to continue exercise at Home (comment)   walking   Frequency Add 1 additional day to program exercise sessions.    Initial Home Exercises Provided 09/01/23             Social History   Tobacco Use  Smoking Status Former   Types: Cigars   Quit date: 10/22/2019   Years since quitting: 3.8  Smokeless Tobacco Never  Tobacco Comments   smoked occ. with son. did not tolerate it long. 12/14/19.    Goals Met:  Proper associated with RPD/PD & O2 Sat Independence with exercise equipment Exercise tolerated well No report of concerns or symptoms today Strength training completed today  Goals Unmet:  Not  Applicable  Comments: Service time is from 1304 to 1440.    Dr. Mechele Collin is Medical Director for Pulmonary Rehab at [facility].

## 2023-09-01 NOTE — Progress Notes (Signed)
Home Exercise Prescription I have reviewed a Home Exercise Prescription with Austin [name]. He is not currently exercising but wants to begin walking with his wife. Discussed beginning with 1-2 nonrehab days, 15 min x2. He has significant back stiffness that he is seeing physical therapy and doing exercises for. He was agreeable to beginning to walk. The patient stated that their goals were to get back to walking with his wife and keep living with quality. We reviewed exercise guidelines, target heart rate during exercise, RPE Scale, weather conditions, endpoints for exercise, warmup and cool down. The patient is encouraged to come to me with any questions. I will continue to follow up with the patient to assist them with progression and safety.  Spent 15 min discussing home exercise plan and goals.  Maribell Demeo [place], [place], ACSM-CEP 09/01/2023 3:09 PM

## 2023-09-02 NOTE — Progress Notes (Signed)
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  The little bit of the low back the radiologist can see on the hip x-ray shows some arthritis in the low back as well.

## 2023-09-03 ENCOUNTER — Other Ambulatory Visit: Payer: Self-pay | Admitting: Family Medicine

## 2023-09-06 ENCOUNTER — Encounter (HOSPITAL_COMMUNITY)
Admission: RE | Admit: 2023-09-06 | Discharge: 2023-09-06 | Disposition: A | Discharge status: Home / Self Care | Payer: Medicare Other | Source: Ambulatory Visit | Attending: Internal Medicine

## 2023-09-06 ENCOUNTER — Encounter (HOSPITAL_COMMUNITY): Payer: Medicare Other

## 2023-09-06 VITALS — Wt 188.7 lb

## 2023-09-06 DIAGNOSIS — Z5189 Encounter for other specified aftercare: Secondary | ICD-10-CM | POA: Diagnosis not present

## 2023-09-06 DIAGNOSIS — J449 Chronic obstructive pulmonary disease, unspecified: Secondary | ICD-10-CM

## 2023-09-06 NOTE — Progress Notes (Signed)
Daily Session Note  Patient Details  Name: Austin [name] MRN: [id number] Date of Birth: 08/08/1941 Referring Provider:   Doristine Devoid Pulmonary Rehab Walk Test from 07/29/2023 in [facility] for [facility], Vascular, & Lung Health  Referring Provider [name]       Encounter Date: 09/06/2023  Check In:  Session Check In - 09/06/23 1316       Check-In   Supervising physician immediately available to respond to emergencies [facility] MD immediately available    Physician(s) Jari Favre, PA    Location [facility]    Staff Present Raford Pitcher, MS, ACSM-CEP, Exercise Physiologist;Whyatt Klinger Synthia Innocent, RN, BSN;Randi Reeve BS, ACSM-CEP, Exercise Physiologist    Virtual Visit No    Medication changes reported     No    Fall or balance concerns reported    No    Tobacco Cessation No Change    Warm-up and Cool-down Performed as group-led instruction    Resistance Training Performed Yes    VAD Patient? No    PAD/SET Patient? No      Pain Assessment   Currently in Pain? No/denies    Multiple Pain Sites No             Capillary Blood Glucose: No results found for this or any previous visit (from the past 24 hour(s)).   Exercise Prescription Changes - 09/06/23 1400       Response to Exercise   Blood Pressure (Admit) 130/66    Blood Pressure (Exercise) 156/78    Blood Pressure (Exit) 118/60    Heart Rate (Admit) 86 bpm    Heart Rate (Exercise) 125 bpm    Heart Rate (Exit) 112 bpm    Oxygen Saturation (Admit) 92 %    Oxygen Saturation (Exercise) 91 %    Oxygen Saturation (Exit) 95 %    Rating of Perceived Exertion (Exercise) 13    Perceived Dyspnea (Exercise) 3    Duration Continue with 30 min of aerobic exercise without signs/symptoms of physical distress.    Intensity THRR unchanged      Progression   Progression Continue to progress workloads to maintain intensity without signs/symptoms of physical distress.      Resistance  Training   Training Prescription Yes    Weight blue bands    Reps 10-15    Time 10 Minutes      Treadmill   MPH 2    Grade 2    Minutes 15    METs 2.9      NuStep   Level 5    Minutes 15    METs 3.6             Social History   Tobacco Use  Smoking Status Former   Types: Cigars   Quit date: 10/22/2019   Years since quitting: 3.8  Smokeless Tobacco Never  Tobacco Comments   smoked occ. with son. did not tolerate it long. 12/14/19.    Goals Met:  Proper associated with RPD/PD & O2 Sat Independence with exercise equipment Exercise tolerated well No report of concerns or symptoms today Strength training completed today  Goals Unmet:  Not Applicable  Comments: Service time is from 1302 to 1443.    Dr. Mechele Collin is Medical Director for Pulmonary Rehab at [facility].

## 2023-09-07 ENCOUNTER — Encounter: Payer: Self-pay | Admitting: Family Medicine

## 2023-09-07 ENCOUNTER — Other Ambulatory Visit: Payer: Self-pay

## 2023-09-07 ENCOUNTER — Ambulatory Visit: Payer: Medicare Other | Admitting: Family Medicine

## 2023-09-07 VITALS — BP 144/80 | HR 83 | Ht 69.0 in | Wt 188.0 lb

## 2023-09-07 DIAGNOSIS — M25552 Pain in left hip: Secondary | ICD-10-CM

## 2023-09-07 NOTE — Progress Notes (Signed)
   I, [name], CMA acting as a scribe for Clementeen Graham, MD.  Austin [name] is a 82 y.o. male who presents to [facility] at [facility] today for f/u L hip pain. Pt was last seen by Dr. [name] on 08/09/23 and was taught HEP.  Today, pt reports some improvement of sx since last visit. Still has some pain while sitting, but sx are bearable. Denies new or worsening sx since last visit. Pt has been doing HEP provided by PT some days weekly, some increased pain while stretching.   Dx imaging: 08/09/23 L hip XR 04/04/23 L-spine XR   Pertinent review of systems: No fevers or chills  Relevant historical information: Hypertension and prostate cancer.  Asthma and COPD.  Receiving pulmonary rehab.  BP (!) 144/80   Pulse 83   Ht 5\' 9"  (1.753 m)   Wt 188 lb (85.3 kg)   SpO2 97%   BMI 27.76 kg/m  General: Well Developed, well nourished, and in no acute distress.   MSK: Left hip: Tender palpation ischial tuberosity.    Lab and Radiology Results  Procedure: Real-time Ultrasound Guided Injection of left hip ischial tuberosity bursa. Device: Philips Affiniti 50G/GE Logiq Images permanently stored and available for review in PACS Verbal informed consent obtained.  Discussed risks and benefits of procedure. Warned about infection, bleeding, hyperglycemia damage to structures among others. Patient expresses understanding and agreement Time-out conducted.   Noted no overlying erythema, induration, or other signs of local infection.   Skin prepped in a sterile fashion.   Local anesthesia: Topical Ethyl chloride.   With sterile technique and under real time ultrasound guidance: 40 mg of Kenalog and 2 mL's of Marcaine injected into ischial tuberosity bursa. Fluid seen entering the bursa.   Completed without difficulty   Pain moderately immediately resolved suggesting accurate placement of the medication.   Advised to call if fevers/chills, erythema, induration, drainage, or  persistent bleeding.   Images permanently stored and available for review in the ultrasound unit.  Impression: Technically successful ultrasound guided injection.        Assessment and Plan: 82 y.o. male with left hip pain multifactorial.  Today the pain is located around the ischial tuberosity.  This was injected with some benefit.  He likely does have some tendinitis of the hip rotators or abductors.  Consider formal physical therapy if needed if not better.  Continue home exercise program for now.   PDMP not reviewed this encounter. Orders Placed This Encounter  Procedures   [place] LIMITED JOINT SPACE STRUCTURES LOW LEFT(NO LINKED CHARGES)    Order Specific Question:   Reason for Exam (SYMPTOM  OR DIAGNOSIS REQUIRED)    Answer:   left hip pain    Order Specific Question:   Preferred imaging location?    Answer:   [facility]   No orders of the defined types were placed in this encounter.    Discussed warning signs or symptoms. Please see discharge instructions. Patient expresses understanding.   The above documentation has been reviewed and is accurate and complete Clementeen Graham, M.D.

## 2023-09-07 NOTE — Progress Notes (Signed)
Pulmonary Individual Treatment Plan  Patient Details  Name: Austin [name] MRN: [id number] Date of Birth: 02-14-1941 Referring Provider:   Doristine Devoid Pulmonary Rehab Walk Test from 07/29/2023 in [facility]  Referring Provider [name]       Initial Encounter Date:  Flowsheet Row Pulmonary Rehab Walk Test from 07/29/2023 in [facility]  Date 07/29/23       Visit Diagnosis: Stage 2 moderate COPD by GOLD classification (HCC)  Patient's Home Medications on Admission:   Current Outpatient Medications:    albuterol (PROVENTIL) (2.5 MG/3ML) 0.083% nebulizer solution, USE 1 VIAL BY NEBULIZATION EVERY 4 HOURS AS NEEDED FOR WHEEZING OR SHORTNESS OF BREATH/DYSPNEA. J45.998, Disp: 375 mL, Rfl: 1   albuterol (VENTOLIN HFA) 108 (90 Base) MCG/ACT inhaler, INHALE 1-2 PUFFS BY MOUTH EVERY 6 HOURS AS NEEDED FOR WHEEZE OR SHORTNESS OF BREATH, Disp: 18 each, Rfl: 5   amLODipine (NORVASC) 5 MG tablet, TAKE 1 TABLET (5 MG TOTAL) BY MOUTH DAILY., Disp: 90 tablet, Rfl: 3   atorvastatin (LIPITOR) 80 MG tablet, TAKE 1 TABLET BY MOUTH EVERY DAY, Disp: 90 tablet, Rfl: 1   baclofen (LIORESAL) 10 MG tablet, Take 0.5-1 tablets (5-10 mg total) by mouth 4 (four) times daily as needed for muscle spasms., Disp: 30 each, Rfl: 0   BREZTRI AEROSPHERE 160-9-4.8 MCG/ACT AERO, INHALE 2 PUFFS INTO THE LUNGS IN THE MORNING AND AT BEDTIME., Disp: 10.7 each, Rfl: 1   Cyanocobalamin (VITAMIN B 12 PO), Take 1,000 Units by mouth., Disp: , Rfl:    fenofibrate micronized (LOFIBRA) 200 MG capsule, TAKE 1 CAPSULE BY MOUTH EVERY DAY, Disp: 90 capsule, Rfl: 0   gabapentin (NEURONTIN) 100 MG capsule, TAKE 1 CAPSULE BY MOUTH AT BEDTIME., Disp: 30 capsule, Rfl: 0   glucose blood (FREESTYLE LITE) test strip, CHECK BLOOD SUGAR THREE TIMES A DAY E11.9, Disp: 100 strip, Rfl: 12   ibuprofen (ADVIL) 800 MG tablet, TAKE 1 TABLET  BY MOUTH EVERY 8 HOURS AS NEEDED, Disp: 30 tablet, Rfl: 0   Lancets Misc. (UNISTIK 2 EXTRA) MISC, Use to check blood sugar 3 times daily/ Dx E11.9, Disp: 200 each, Rfl: 0   loratadine (CLARITIN) 10 MG tablet, TAKE 1 TABLET BY MOUTH EVERY DAY, Disp: 90 tablet, Rfl: 3   losartan (COZAAR) 100 MG tablet, TAKE 1 TABLET BY MOUTH EVERY DAY, Disp: 90 tablet, Rfl: 3   magnesium oxide (MAG-OX) 400 (240 Mg) MG tablet, TAKE 1 TABLET BY MOUTH EVERY DAY, Disp: 90 tablet, Rfl: 3   montelukast (SINGULAIR) 10 MG tablet, TAKE 1 TABLET BY MOUTH EVERYDAY AT BEDTIME, Disp: 90 tablet, Rfl: 1   nitrofurantoin, macrocrystal-monohydrate, (MACROBID) 100 MG capsule, Take 100 mg by mouth daily., Disp: , Rfl:    omeprazole (PRILOSEC) 20 MG capsule, TAKE 2 CAPSULES BY MOUTH EVERY DAY, Disp: 180 capsule, Rfl: 1   ondansetron (ZOFRAN) 4 MG tablet, Take 1 tablet (4 mg total) by mouth every 8 (eight) hours as needed for nausea or vomiting., Disp: 20 tablet, Rfl: 0   solifenacin (VESICARE) 5 MG tablet, Take 5 mg by mouth daily., Disp: , Rfl:    Spacer/Aero-Holding Chambers DEVI, 1 each by Does not apply route as directed., Disp: 1 each, Rfl: 1   Vitamin D, Cholecalciferol, 50 MCG (2000 UT) CAPS, Take by mouth., Disp: , Rfl:   Past Medical History: Past Medical History:  Diagnosis Date   Asthma    Depression  Hypertension    Prostate cancer (HCC)    Urinary incontinence     Tobacco Use: Social History   Tobacco Use  Smoking Status Former   Types: Cigars   Quit date: 10/22/2019   Years since quitting: 3.8  Smokeless Tobacco Never  Tobacco Comments   smoked occ. with son. did not tolerate it long. 12/14/19.    Labs: Review Flowsheet  More data exists      Latest Ref Rng & Units 02/10/2021 11/23/2021 04/23/2022 10/27/2022 03/31/2023  Labs for ITP Cardiac and Pulmonary Rehab  Cholestrol 0 - 200 mg/dL 161  096  045  409  811   LDL (calc) 0 - 99 mg/dL 90  90  914  85  91   HDL-C >39.00 mg/dL 78.29  56.21  30.86  57.84   49.80   Trlycerides 0.0 - 149.0 mg/dL 69.6  29.5  28.4  132.4  83.0   Hemoglobin A1c 4.0 - 5.6 % 5.7  6.2  6.1  6.4  5.8     Details            Capillary Blood Glucose: No results found for: "GLUCAP"   Pulmonary Assessment Scores:  Pulmonary Assessment Scores     Row Name 07/29/23 1341         ADL UCSD   ADL Phase Entry     SOB Score total 53       CAT Score   CAT Score 15       mMRC Score   mMRC Score 3             UCSD: Self-administered rating of dyspnea associated with activities of daily living (ADLs) 6-point scale (0 = "not at all" to 5 = "maximal or unable to do because of breathlessness")  Scoring Scores range from 0 to 120.  Minimally important difference is 5 units  CAT: CAT can identify the health impairment of COPD patients and is better correlated with disease progression.  CAT has a scoring range of zero to 40. The CAT score is classified into four groups of low (less than 10), medium (10 - 20), high (21-30) and very high (31-40) based on the impact level of disease on health status. A CAT score over 10 suggests significant symptoms.  A worsening CAT score could be explained by an exacerbation, poor medication adherence, poor inhaler technique, or progression of COPD or comorbid conditions.  CAT MCID is 2 points  mMRC: mMRC (Modified Medical Research Council) Dyspnea Scale is used to assess the degree of baseline functional disability in patients of respiratory disease due to dyspnea. No minimal important difference is established. A decrease in score of 1 point or greater is considered a positive change.   Pulmonary Function Assessment:  Pulmonary Function Assessment - 07/29/23 1341       Breath   Shortness of Breath Yes;Fear of Shortness of Breath;Limiting activity             Exercise Target Goals: Exercise Program Goal: Individual exercise prescription set using results from initial 6 min walk test and THRR while considering   patient's activity barriers and safety.   Exercise Prescription Goal: Initial exercise prescription builds to 30-45 minutes a day of aerobic activity, 2-3 days per week.  Home exercise guidelines will be given to patient during program as part of exercise prescription that the participant will acknowledge.  Activity Barriers & Risk Stratification:  Activity Barriers & Cardiac Risk Stratification - 07/29/23 1343  Activity Barriers & Cardiac Risk Stratification   Activity Barriers Left Knee Replacement;Right Knee Replacement;Shortness of Breath;Muscular Weakness;Deconditioning;Back Problems;Balance Concerns;Arthritis             6 Minute Walk:  6 Minute Walk     Row Name 07/29/23 1427         6 Minute Walk   Phase Initial     Distance 1170 feet     Walk Time 6 minutes     # of Rest Breaks 0     MPH 2.22     METS 2.9     RPE 15     Perceived Dyspnea  2     VO2 Peak 10.14     Symptoms No     Resting HR 81 bpm     Resting BP 138/68     Resting Oxygen Saturation  96 %     Exercise Oxygen Saturation  during 6 min walk 90 %     Max Ex. HR 128 bpm     Max Ex. BP 198/80     2 Minute Post BP 140/74       Interval HR   1 Minute HR 104     2 Minute HR 113     3 Minute HR 115     4 Minute HR 121     5 Minute HR 124     6 Minute HR 128     2 Minute Post HR 98     Interval Heart Rate? Yes       Interval Oxygen   Interval Oxygen? Yes     Baseline Oxygen Saturation % 96 %     1 Minute Oxygen Saturation % 96 %     1 Minute Liters of Oxygen 0 L     2 Minute Oxygen Saturation % 91 %     2 Minute Liters of Oxygen 0 L     3 Minute Oxygen Saturation % 91 %     3 Minute Liters of Oxygen 0 L     4 Minute Oxygen Saturation % 91 %     4 Minute Liters of Oxygen 0 L     5 Minute Oxygen Saturation % 90 %     5 Minute Liters of Oxygen 0 L     6 Minute Oxygen Saturation % 90 %     6 Minute Liters of Oxygen 0 L     2 Minute Post Oxygen Saturation % 95 %     2 Minute Post  Liters of Oxygen 0 L              Oxygen Initial Assessment:  Oxygen Initial Assessment - 07/29/23 1340       Home Oxygen   Home Oxygen Device None    Sleep Oxygen Prescription None    Home Exercise Oxygen Prescription None    Home Resting Oxygen Prescription None      Initial 6 min Walk   Oxygen Used None      Program Oxygen Prescription   Program Oxygen Prescription None      Intervention   Short Term Goals To learn and understand importance of maintaining oxygen saturations>88%;To learn and demonstrate proper use of respiratory medications;To learn and understand importance of monitoring SPO2 with pulse oximeter and demonstrate accurate use of the pulse oximeter.;To learn and demonstrate proper pursed lip breathing techniques or other breathing techniques.     Long  Term Goals Verbalizes importance of monitoring SPO2  with pulse oximeter and return demonstration;Maintenance of O2 saturations>88%;Exhibits proper breathing techniques, such as pursed lip breathing or other method taught during program session;Compliance with respiratory medication;Demonstrates proper use of MDI's             Oxygen Re-Evaluation:  Oxygen Re-Evaluation     Row Name 08/01/23 1506 08/30/23 0935           Program Oxygen Prescription   Program Oxygen Prescription None None        Home Oxygen   Home Oxygen Device None None      Sleep Oxygen Prescription None None      Home Exercise Oxygen Prescription None None      Home Resting Oxygen Prescription None None        Goals/Expected Outcomes   Short Term Goals To learn and understand importance of maintaining oxygen saturations>88%;To learn and demonstrate proper use of respiratory medications;To learn and understand importance of monitoring SPO2 with pulse oximeter and demonstrate accurate use of the pulse oximeter.;To learn and demonstrate proper pursed lip breathing techniques or other breathing techniques.  To learn and understand  importance of maintaining oxygen saturations>88%;To learn and demonstrate proper use of respiratory medications;To learn and understand importance of monitoring SPO2 with pulse oximeter and demonstrate accurate use of the pulse oximeter.;To learn and demonstrate proper pursed lip breathing techniques or other breathing techniques.       Long  Term Goals Verbalizes importance of monitoring SPO2 with pulse oximeter and return demonstration;Maintenance of O2 saturations>88%;Exhibits proper breathing techniques, such as pursed lip breathing or other method taught during program session;Compliance with respiratory medication;Demonstrates proper use of MDI's Verbalizes importance of monitoring SPO2 with pulse oximeter and return demonstration;Maintenance of O2 saturations>88%;Exhibits proper breathing techniques, such as pursed lip breathing or other method taught during program session;Compliance with respiratory medication;Demonstrates proper use of MDI's      Goals/Expected Outcomes Compliance and understanding of oxygen saturation monitoring and breathing techniques to decrease shortness of breath Compliance and understanding of oxygen saturation monitoring and breathing techniques to decrease shortness of breath               Oxygen Discharge (Final Oxygen Re-Evaluation):  Oxygen Re-Evaluation - 08/30/23 0935       Program Oxygen Prescription   Program Oxygen Prescription None      Home Oxygen   Home Oxygen Device None    Sleep Oxygen Prescription None    Home Exercise Oxygen Prescription None    Home Resting Oxygen Prescription None      Goals/Expected Outcomes   Short Term Goals To learn and understand importance of maintaining oxygen saturations>88%;To learn and demonstrate proper use of respiratory medications;To learn and understand importance of monitoring SPO2 with pulse oximeter and demonstrate accurate use of the pulse oximeter.;To learn and demonstrate proper pursed lip breathing  techniques or other breathing techniques.     Long  Term Goals Verbalizes importance of monitoring SPO2 with pulse oximeter and return demonstration;Maintenance of O2 saturations>88%;Exhibits proper breathing techniques, such as pursed lip breathing or other method taught during program session;Compliance with respiratory medication;Demonstrates proper use of MDI's    Goals/Expected Outcomes Compliance and understanding of oxygen saturation monitoring and breathing techniques to decrease shortness of breath             Initial Exercise Prescription:  Initial Exercise Prescription - 07/29/23 1400       Date of Initial Exercise RX and Referring Provider   Date 07/29/23    Referring Provider  [name]    Expected Discharge Date 10/27/23      Treadmill   MPH 2    Grade 2    Minutes 15      NuStep   Level 2    SPM 60    Minutes 15      Prescription Details   Frequency (times per week) 2    Duration Progress to 30 minutes of continuous aerobic without signs/symptoms of physical distress      Intensity   THRR 40-80% of Max Heartrate 55-110    Ratings of Perceived Exertion 11-13    Perceived Dyspnea 0-4      Progression   Progression Continue to progress workloads to maintain intensity without signs/symptoms of physical distress.      Resistance Training   Training Prescription Yes    Weight blue bands    Reps 10-15             Perform Capillary Blood Glucose checks as needed.  Exercise Prescription Changes:   Exercise Prescription Changes     Row Name 08/09/23 1500 08/23/23 1500 09/01/23 1400 09/06/23 1400       Response to Exercise   Blood Pressure (Admit) 148/70 142/70 -- 130/66    Blood Pressure (Exercise) 154/72 -- -- 156/78    Blood Pressure (Exit) 132/58 136/70 -- 118/60    Heart Rate (Admit) 76 bpm 81 bpm -- 86 bpm    Heart Rate (Exercise) 104 bpm 110 bpm -- 125 bpm    Heart Rate (Exit) 89 bpm 97 bpm -- 112 bpm    Oxygen Saturation (Admit) 96 % 95 % --  92 %    Oxygen Saturation (Exercise) 94 % 92 % -- 91 %    Oxygen Saturation (Exit) 94 % 94 % -- 95 %    Rating of Perceived Exertion (Exercise) 13 13 -- 13    Perceived Dyspnea (Exercise) 2 2 -- 3    Duration Continue with 30 min of aerobic exercise without signs/symptoms of physical distress. Continue with 30 min of aerobic exercise without signs/symptoms of physical distress. -- Continue with 30 min of aerobic exercise without signs/symptoms of physical distress.    Intensity THRR unchanged THRR unchanged -- THRR unchanged      Progression   Progression Continue to progress workloads to maintain intensity without signs/symptoms of physical distress. Continue to progress workloads to maintain intensity without signs/symptoms of physical distress. -- Continue to progress workloads to maintain intensity without signs/symptoms of physical distress.      Resistance Training   Training Prescription Yes Yes -- Yes    Weight blue bands blue bands -- blue bands    Reps 10-15 10-15 -- 10-15    Time 10 Minutes 10 Minutes -- 10 Minutes      Treadmill   MPH 2 2 -- 2    Grade 2 2 -- 2    Minutes 15 15 -- 15    METs 3.08 3.08 -- 2.9      NuStep   Level 2 3 -- 5    Minutes 15 15 -- 15    METs 2.2 3 -- 3.6      Home Exercise Plan   Plans to continue exercise at -- -- Home (comment)  walking --    Frequency -- -- Add 1 additional day to program exercise sessions. --    Initial Home Exercises Provided -- -- 09/01/23 --  Exercise Comments:   Exercise Comments     Row Name 08/04/23 1518 09/01/23 1455         Exercise Comments Pt completed his first day of group exercise. He exercised on the treadmill for 15 min at 2.0 mph, incline 0. He then exercised on the recumbent stepper for 15 min, level 2, METs 2.6. He tolerated well. Performed warm up and cool down with verbal cues and demonstrative cues. Blue bands were a challenge for his shoulders. Discussed METs with good reception.  Discussed with pt home exercise plan. He is not currently exercising but wants to begin walking with his wife. Discussed beginning with 1-2 nonrehab days, 15 min x2. He has significant back stiffness that he is seeing physical therapy and doing exercises for. He was agreeable to beginning to walk.               Exercise Goals and Review:   Exercise Goals     Row Name 07/29/23 1343             Exercise Goals   Increase Physical Activity Yes       Intervention Provide advice, education, support and counseling about physical activity/exercise needs.;Develop an individualized exercise prescription for aerobic and resistive training based on initial evaluation findings, risk stratification, comorbidities and participant's personal goals.       Expected Outcomes Short Term: Attend rehab on a regular basis to increase amount of physical activity.;Long Term: Add in home exercise to make exercise part of routine and to increase amount of physical activity.;Long Term: Exercising regularly at least 3-5 days a week.       Increase Strength and Stamina Yes       Intervention Provide advice, education, support and counseling about physical activity/exercise needs.;Develop an individualized exercise prescription for aerobic and resistive training based on initial evaluation findings, risk stratification, comorbidities and participant's personal goals.       Expected Outcomes Short Term: Increase workloads from initial exercise prescription for resistance, speed, and METs.;Short Term: Perform resistance training exercises routinely during rehab and add in resistance training at home;Long Term: Improve cardiorespiratory fitness, muscular endurance and strength as measured by increased METs and functional capacity ( )       Able to understand and use rate of perceived exertion (RPE) scale Yes       Intervention Provide education and explanation on how to use RPE scale       Expected Outcomes Short Term:  Able to use RPE daily in rehab to express subjective intensity level;Long Term:  Able to use RPE to guide intensity level when exercising independently       Able to understand and use Dyspnea scale Yes       Intervention Provide education and explanation on how to use Dyspnea scale       Expected Outcomes Short Term: Able to use Dyspnea scale daily in rehab to express subjective sense of shortness of breath during exertion;Long Term: Able to use Dyspnea scale to guide intensity level when exercising independently       Knowledge and understanding of Target Heart Rate Range (THRR) Yes       Intervention Provide education and explanation of THRR including how the numbers were predicted and where they are located for reference       Expected Outcomes Short Term: Able to state/look up THRR;Long Term: Able to use THRR to govern intensity when exercising independently;Short Term: Able to use daily as guideline for intensity in  rehab       Understanding of Exercise Prescription Yes       Intervention Provide education, explanation, and written materials on patient's individual exercise prescription       Expected Outcomes Short Term: Able to explain program exercise prescription;Long Term: Able to explain home exercise prescription to exercise independently                Exercise Goals Re-Evaluation :  Exercise Goals Re-Evaluation     Row Name 08/01/23 1458 08/30/23 0928           Exercise Goal Re-Evaluation   Exercise Goals Review Increase Physical Activity;Able to understand and use Dyspnea scale;Understanding of Exercise Prescription;Increase Strength and Stamina;Knowledge and understanding of Target Heart Rate Range (THRR);Able to understand and use rate of perceived exertion (RPE) scale Increase Physical Activity;Able to understand and use Dyspnea scale;Understanding of Exercise Prescription;Increase Strength and Stamina;Knowledge and understanding of Target Heart Rate Range (THRR);Able to  understand and use rate of perceived exertion (RPE) scale      Comments Pt to begin group exercise on 08/04/23. Will progress as tolerated. Pt has completed 5 group exercise sessions. He has missed the last 2 sessions for vacation. He is exercising on the treadmill 15 min, 2 mph, 2 incline, METs 3.08. He then exercises on the recumbent stepper for 15 min, level 3, METs 3.0. He is progressing and tolerating well. He performs warm up and cool down standing, including squats. Using blue bands. Will progress as tolerated.      Expected Outcomes Through exercise at rehab and at home, the patient will decrease shortness of breath with daily activities and feel confident in carrying out an exercise regime at home. Through exercise at rehab and at home, the patient will decrease shortness of breath with daily activities and feel confident in carrying out an exercise regime at home.               Discharge Exercise Prescription (Final Exercise Prescription Changes):  Exercise Prescription Changes - 09/06/23 1400       Response to Exercise   Blood Pressure (Admit) 130/66    Blood Pressure (Exercise) 156/78    Blood Pressure (Exit) 118/60    Heart Rate (Admit) 86 bpm    Heart Rate (Exercise) 125 bpm    Heart Rate (Exit) 112 bpm    Oxygen Saturation (Admit) 92 %    Oxygen Saturation (Exercise) 91 %    Oxygen Saturation (Exit) 95 %    Rating of Perceived Exertion (Exercise) 13    Perceived Dyspnea (Exercise) 3    Duration Continue with 30 min of aerobic exercise without signs/symptoms of physical distress.    Intensity THRR unchanged      Progression   Progression Continue to progress workloads to maintain intensity without signs/symptoms of physical distress.      Resistance Training   Training Prescription Yes    Weight blue bands    Reps 10-15    Time 10 Minutes      Treadmill   MPH 2    Grade 2    Minutes 15    METs 2.9      NuStep   Level 5    Minutes 15    METs 3.6              Nutrition:  Target Goals: Understanding of nutrition guidelines, daily intake of sodium 1500mg , cholesterol 200mg , calories 30% from fat and 7% or less from saturated fats, daily to  have 5 or more servings of fruits and vegetables.  Biometrics:    Nutrition Therapy Plan and Nutrition Goals:  Nutrition Therapy & Goals - 08/30/23 1352       Nutrition Therapy   Diet Heart Healthy Diet    Drug/Food Interactions Statins/Certain Fruits      Personal Nutrition Goals   Nutrition Goal Patient to improve diet quality by using the plate method as a guide for meal planning to include lean protein/plant protein, fruits, vegetables, whole grains, nonfat dairy as part of a well-balanced diet.   goal in progress.   Personal Goal #2 Patient to identify strategies for weight loss of 0.5-2.0# per week.   goal not met.   Comments Goals in progress. Austin [name] has attended only 6 exercise sessions. Patient has medical history of COPD stage2, HTN, dyslipidemia. Patient's A1c is in a prediabetic range and liver enzymes are elevated. RUQ ultrasound from 04/26/23 showed fatty liver disease; he does report daily alcohol intake. He does report motivation to lose ~5-10# to aid with improving breathing; he is down 1.5# since starting with our program. He enjoys a wide variety of foods. Patient will benefit from participation in pulmonary rehab for nutrition, exercise, and lifestyle modification.      Intervention Plan   Intervention Prescribe, educate and counsel regarding individualized specific dietary modifications aiming towards targeted core components such as weight, hypertension, lipid management, diabetes, heart failure and other comorbidities.;Nutrition handout(s) given to patient.    Expected Outcomes Short Term Goal: Understand basic principles of dietary content, such as calories, fat, sodium, cholesterol and nutrients.;Long Term Goal: Adherence to prescribed nutrition plan.              Nutrition Assessments:  Nutrition Assessments - 08/04/23 1506       Rate Your Plate Scores   Pre Score 55            MEDIFICTS Score Key: >=70 Need to make dietary changes  40-70 Heart Healthy Diet <= 40 Therapeutic Level Cholesterol Diet  Flowsheet Row PULMONARY REHAB CHRONIC OBSTRUCTIVE PULMONARY DISEASE from 08/04/2023 in [facility]  Picture Your Plate Total Score on Admission 55      Picture Your Plate Scores: <16 Unhealthy dietary pattern with much room for improvement. 41-50 Dietary pattern unlikely to meet recommendations for good health and room for improvement. 51-60 More healthful dietary pattern, with some room for improvement.  >60 Healthy dietary pattern, although there may be some specific behaviors that could be improved.    Nutrition Goals Re-Evaluation:  Nutrition Goals Re-Evaluation     Row Name 08/04/23 1401 08/30/23 1352           Goals   Current Weight 193 lb 9 oz (87.8 kg) 192 lb 0.3 oz (87.1 kg)      Comment A1c 5.8, AST 40, ALT 58, lipids WNL, LDL 91 (lipitor, fenofibrate) no new labs; most recent labs A1c 5.8, AST 40, ALT 58, lipids WNL, LDL 89 (lipitor, fenofibrate)      Expected Outcome Patient has medical history of COPD stage2, HTN, dyslipidemia. Patient's A1c is in a prediabetic range and liver enzymes are elevated. RUQ ultrasound from 04/26/23 showed fatty liver disease; he does report daily alcohol intake. He does report motivation to lose ~5-10# to aid with improving breathing. He enjoys a wide variety of foods. Patient will benefit from participation in pulmonary rehab for nutrition, exercise, and lifestyle modification. Goals in progress. Austin [name] has attended  only 6 exercise sessions. Patient has medical history of COPD stage2, HTN, dyslipidemia. Patient's A1c is in a prediabetic range and liver enzymes are elevated. RUQ ultrasound from 04/26/23 showed fatty liver disease; he does  report daily alcohol intake. He does report motivation to lose ~5-10# to aid with improving breathing; he is down 1.5# since starting with our program. He enjoys a wide variety of foods. Patient will benefit from participation in pulmonary rehab for nutrition, exercise, and lifestyle modification.               Nutrition Goals Discharge (Final Nutrition Goals Re-Evaluation):  Nutrition Goals Re-Evaluation - 08/30/23 1352       Goals   Current Weight 192 lb 0.3 oz (87.1 kg)    Comment no new labs; most recent labs A1c 5.8, AST 40, ALT 58, lipids WNL, LDL 91 (lipitor, fenofibrate)    Expected Outcome Goals in progress. Austin [name] has attended only 6 exercise sessions. Patient has medical history of COPD stage2, HTN, dyslipidemia. Patient's A1c is in a prediabetic range and liver enzymes are elevated. RUQ ultrasound from 04/26/23 showed fatty liver disease; he does report daily alcohol intake. He does report motivation to lose ~5-10# to aid with improving breathing; he is down 1.5# since starting with our program. He enjoys a wide variety of foods. Patient will benefit from participation in pulmonary rehab for nutrition, exercise, and lifestyle modification.             Psychosocial: Target Goals: Acknowledge presence or absence of significant depression and/or stress, maximize coping skills, provide positive support system. Participant is able to verbalize types and ability to use techniques and skills needed for reducing stress and depression.  Initial Review & Psychosocial Screening:  Initial Psych Review & Screening - 07/29/23 1333       Initial Review   Current issues with Current Sleep Concerns    Comments Pt has over the counter medication to help him fall asleep. Pt denies any psychosocial needs at this time.      Family Dynamics   Good Support System? Yes    Comments wife, son and daughter-in-law      Barriers   Psychosocial barriers to participate in program There are no  identifiable barriers or psychosocial needs.      Screening Interventions   Interventions Encouraged to exercise             Quality of Life Scores:  Scores of 19 and below usually indicate a poorer quality of life in these areas.  A difference of  2-3 points is a clinically meaningful difference.  A difference of 2-3 points in the total score of the Quality of Life Index has been associated with significant improvement in overall quality of life, self-image, physical symptoms, and general health in studies assessing change in quality of life.  PHQ-9: Review Flowsheet  More data exists      07/29/2023 03/31/2023 10/27/2022 09/21/2022 07/23/2022  Depression screen PHQ 2/9  Decreased Interest 1 0 0 0 0 0  Down, Depressed, Hopeless 1 0 0 0 0 0  PHQ - 2 Score 2 0 0 0 0 0  Altered sleeping 0 0 0 0 0  Tired, decreased energy 0 0 0 0 0  Change in appetite 0 0 0 0 0  Feeling bad or failure about yourself  1 0 0 0 0  Trouble concentrating 1 0 0 0 0  Moving slowly or fidgety/restless 0 0 0 0 0  Suicidal thoughts  0 0 0 0 0  PHQ-9 Score 4 0 0 0 0  Difficult doing work/chores Somewhat difficult Not difficult at all Not difficult at all Not difficult at all Not difficult at all    Details       Multiple values from one day are sorted in reverse-chronological order        Interpretation of Total Score  Total Score Depression Severity:  1-4 = Minimal depression, 5-9 = Mild depression, 10-14 = Moderate depression, 15-19 = Moderately severe depression, 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    Expected Outcomes For Kayston to participate in PR free of psychosocial concerns.    Continue Psychosocial Services  No Follow up  required             Psychosocial Re-Evaluation:  Psychosocial Re-Evaluation     Row Name 08/05/23 1121 09/02/23 1004           Psychosocial Re-Evaluation   Current issues with Current Sleep Concerns Current Sleep Concerns      Comments Austin [name] still endorses early morning fatigue but states he is taking sleeping meds to keep him asleep throughout the night. Encouraged pt to speak to MD if he is concerned or his sleep habits worsen. Darrly denies any other psychosocial barriers or concern. Denies any needs at this time. Psychosocial monthly re-evaluation is as follows: Austin [name] states that his sleeping habits have improved. He stated more restful sleep waking up more energized than prior. He stated not 100% but better. Sai denies any other psychosocial barriers or concern. He denies any needs at this time.      Expected Outcomes For Austin [name] is get more restful sleep and continue to attend PR without any other psychosocial barriers or concerns. For Austin [name] is get more restful sleep and continue to attend PR without any other psychosocial barriers or concerns.      Interventions Encouraged to attend Pulmonary Rehabilitation for the exercise Encouraged to attend Pulmonary Rehabilitation for the exercise      Continue Psychosocial Services  No Follow up required No Follow up required               Psychosocial Discharge (Final Psychosocial Re-Evaluation):  Psychosocial Re-Evaluation - 09/02/23 1004       Psychosocial Re-Evaluation   Current issues with Current Sleep Concerns    Comments Psychosocial monthly re-evaluation is as follows: Austin [name] states that his sleeping habits have improved. He stated more restful sleep waking up more energized than prior. He stated not 100% but better. Austin [name] denies any other psychosocial barriers or concern. He denies any needs at this time.    Expected Outcomes For Austin [name] is get more restful sleep and continue to attend PR without any other psychosocial  barriers or concerns.    Interventions Encouraged to attend Pulmonary Rehabilitation for the exercise    Continue Psychosocial Services  No Follow up required             Education: Education Goals: Education classes will be provided on a weekly basis, covering required topics. Participant will state understanding/return demonstration of topics presented.  Learning Barriers/Preferences:  Learning Barriers/Preferences - 07/29/23 1337       Learning Barriers/Preferences   Learning Barriers Sight  Learning Preferences Skilled Demonstration             Education Topics: Know Your Numbers Group instruction that is supported by a PowerPoint presentation. Instructor discusses importance of knowing and understanding resting, exercise, and post-exercise oxygen saturation, heart rate, and blood pressure. Oxygen saturation, heart rate, blood pressure, rating of perceived exertion, and dyspnea are reviewed along with a normal range for these values.    Exercise for the Pulmonary Patient Group instruction that is supported by a PowerPoint presentation. Instructor discusses benefits of exercise, core components of exercise, frequency, duration, and intensity of an exercise routine, importance of utilizing pulse oximetry during exercise, safety while exercising, and options of places to exercise outside of rehab.    MET Level  Group instruction provided by PowerPoint, verbal discussion, and written material to support subject matter. Instructor reviews what METs are and how to increase METs.  Flowsheet Row PULMONARY REHAB CHRONIC OBSTRUCTIVE PULMONARY DISEASE from 09/01/2023 in [facility]  Date 09/01/23  Educator EP  Instruction Review Code 1- Verbalizes Understanding       Pulmonary Medications Verbally interactive group education provided by instructor with focus on inhaled medications and proper  administration.   Anatomy and Physiology of the Respiratory System Group instruction provided by PowerPoint, verbal discussion, and written material to support subject matter. Instructor reviews respiratory cycle and anatomical components of the respiratory system and their functions. Instructor also reviews differences in obstructive and restrictive respiratory diseases with examples of each.    Oxygen Safety Group instruction provided by PowerPoint, verbal discussion, and written material to support subject matter. There is an overview of "What is Oxygen" and "Why do we need it".  Instructor also reviews how to create a safe environment for oxygen use, the importance of using oxygen as prescribed, and the risks of noncompliance. There is a brief discussion on traveling with oxygen and resources the patient may utilize.   Oxygen Use Group instruction provided by PowerPoint, verbal discussion, and written material to discuss how supplemental oxygen is prescribed and different types of oxygen supply systems. Resources for more information are provided.    Breathing Techniques Group instruction that is supported by demonstration and informational handouts. Instructor discusses the benefits of pursed lip and diaphragmatic breathing and detailed demonstration on how to perform both.  Flowsheet Row PULMONARY REHAB CHRONIC OBSTRUCTIVE PULMONARY DISEASE from 08/04/2023 in [facility]  Date 08/04/23  Educator RN  Instruction Review Code 1- Verbalizes Understanding        Risk Factor Reduction Group instruction that is supported by a PowerPoint presentation. Instructor discusses the definition of a risk factor, different risk factors for pulmonary disease, and how the heart and lungs work together.   Pulmonary Diseases Group instruction provided by PowerPoint, verbal discussion, and written material to support subject matter. Instructor  gives an overview of the different type of pulmonary diseases. There is also a discussion on risk factors and symptoms as well as ways to manage the diseases.   Stress and Energy Conservation Group instruction provided by PowerPoint, verbal discussion, and written material to support subject matter. Instructor gives an overview of stress and the impact it can have on the body. Instructor also reviews ways to reduce stress. There is also a discussion on energy conservation and ways to conserve energy throughout the day. Flowsheet Row PULMONARY REHAB CHRONIC OBSTRUCTIVE PULMONARY DISEASE from 08/11/2023 in [place]  [facility], [facility]  Date 08/11/23  Educator RN  Instruction Review Code 1- Verbalizes Understanding       Warning Signs and Symptoms Group instruction provided by PowerPoint, verbal discussion, and written material to support subject matter. Instructor reviews warning signs and symptoms of stroke, heart attack, cold and flu. Instructor also reviews ways to prevent the spread of infection.   Other Education Group or individual verbal, written, or video instructions that support the educational goals of the pulmonary rehab program.    Knowledge Questionnaire Score:  Knowledge Questionnaire Score - 07/29/23 1436       Knowledge Questionnaire Score   Pre Score 14/18             Core Components/Risk Factors/Patient Goals at Admission:  Personal Goals and Risk Factors at Admission - 07/29/23 1338       Core Components/Risk Factors/Patient Goals on Admission    Weight Management Yes;Weight Loss    Intervention Weight Management: Develop a combined nutrition and exercise program designed to reach desired caloric intake, while maintaining appropriate intake of nutrient and fiber, sodium and fats, and appropriate energy expenditure required for the weight goal.;Weight Management: Provide education and appropriate resources to help  participant work on and attain dietary goals.;Weight Management/Obesity: Establish reasonable short term and long term weight goals.;Obesity: Provide education and appropriate resources to help participant work on and attain dietary goals.    Expected Outcomes Short Term: Continue to assess and modify interventions until short term weight is achieved;Long Term: Adherence to nutrition and physical activity/exercise program aimed toward attainment of established weight goal;Weight Maintenance: Understanding of the daily nutrition guidelines, which includes 25-35% calories from fat, 7% or less cal from saturated fats, less than 200mg  cholesterol, less than 1.5gm of sodium, & 5 or more servings of fruits and vegetables daily;Understanding recommendations for meals to include 15-35% energy as protein, 25-35% energy from fat, 35-60% energy from carbohydrates, less than 200mg  of dietary cholesterol, 20-35 gm of total fiber daily;Weight Loss: Understanding of general recommendations for a balanced deficit meal plan, which promotes 1-2 lb weight loss per week and includes a negative energy balance of [phone] kcal/d;Understanding of distribution of calorie intake throughout the day with the consumption of 4-5 meals/snacks    Improve shortness of breath with ADL's Yes    Intervention Provide education, individualized exercise plan and daily activity instruction to help decrease symptoms of SOB with activities of daily living.    Expected Outcomes Short Term: Improve cardiorespiratory fitness to achieve a reduction of symptoms when performing ADLs;Long Term: Be able to perform more ADLs without symptoms or delay the onset of symptoms    Increase knowledge of respiratory medications and ability to use respiratory devices properly  Yes    Intervention Provide education and demonstration as needed of appropriate use of medications, inhalers, and oxygen therapy.    Expected Outcomes Short Term: Achieves understanding of  medications use. Understands that oxygen is a medication prescribed by physician. Demonstrates appropriate use of inhaler and oxygen therapy.;Long Term: Maintain appropriate use of medications, inhalers, and oxygen therapy.             Core Components/Risk Factors/Patient Goals Review:   Goals and Risk Factor Review     Row Name 08/05/23 1126 09/02/23 1011           Core Components/Risk Factors/Patient Goals Review   Personal Goals Review Weight Management/Obesity;Improve shortness of breath with ADL's;Develop more efficient breathing techniques such as  purse lipped breathing and diaphragmatic breathing and practicing self-pacing with activity.;Increase knowledge of respiratory medications and ability to use respiratory devices properly. Weight Management/Obesity;Improve shortness of breath with ADL's;Develop more efficient breathing techniques such as purse lipped breathing and diaphragmatic breathing and practicing self-pacing with activity.      Review Unable to assess goals. Austin [name] has attended 1 PR session so far. He will continue to benefit from PR for nutrition, education, exercise, and lifestyle modification. Core components/risk factors/patient goals monthly re-evaluate are as follows: Goal progressing for weight loss. Austin [name] has lost ~1.5# from starting the program 3 weeks ago. He is working with our dietician on small changes he can make in his diet. Jeorge' goal of decreasing his shortness of breath with ADLs and developing more efficient breathing techniques such as purse lipped breathing and diaphragmatic breathing; and practicing self-pacing with activity is in progress. He has maintained his O2 saturation >88% on room air with exertion. He has also been able to increase his workload and METs. Goal met on Increasing knowledge of respiratory medications and ability to use respiratory devices properly. Austin [name] has brought his inhalers to class and has demonstrated how to properly use  them with our respiratory therapist. After providing feedback on how/when to use, Austin [name] now understands, and we review with him before class. Austin [name] will continue to benefit from participation in PR for nutrition, education, exercise, and lifestyle modification.      Expected Outcomes For Austin [name] to improve shortness of breath with ADL's, increase knowledge of respiratory medications and ability to use respiratory devices properly, to lose weight and develop more efficient breathing techniques such as purse lipped breathing and diaphragmatic breathing; and practicing self-pacing with activity For Austin [name] to improve shortness of breath with ADL's, to lose weight, and develop more efficient breathing techniques such as purse lipped breathing and diaphragmatic breathing; and practicing self-pacing with activity               Core Components/Risk Factors/Patient Goals at Discharge (Final Review):   Goals and Risk Factor Review - 09/02/23 1011       Core Components/Risk Factors/Patient Goals Review   Personal Goals Review Weight Management/Obesity;Improve shortness of breath with ADL's;Develop more efficient breathing techniques such as purse lipped breathing and diaphragmatic breathing and practicing self-pacing with activity.    Review Core components/risk factors/patient goals monthly re-evaluate are as follows: Goal progressing for weight loss. Austin [name] has lost ~1.5# from starting the program 3 weeks ago. He is working with our dietician on small changes he can make in his diet. Justinryan' goal of decreasing his shortness of breath with ADLs and developing more efficient breathing techniques such as purse lipped breathing and diaphragmatic breathing; and practicing self-pacing with activity is in progress. He has maintained his O2 saturation >88% on room air with exertion. He has also been able to increase his workload and METs. Goal met on Increasing knowledge of respiratory medications and ability to use  respiratory devices properly. Austin [name] has brought his inhalers to class and has demonstrated how to properly use them with our respiratory therapist. After providing feedback on how/when to use, Austin [name] now understands, and we review with him before class. Dionysius will continue to benefit from participation in PR for nutrition, education, exercise, and lifestyle modification.    Expected Outcomes For Braylan to improve shortness of breath with ADL's, to lose weight, and develop more efficient breathing techniques such as purse lipped breathing and diaphragmatic breathing; and practicing self-pacing with activity  ITP Comments: Pt is making expected progress toward Pulmonary Rehab goals after completing 8 session(s). Recommend continued exercise, life style modification, education, and utilization of breathing techniques to increase stamina and strength, while also decreasing shortness of breath with exertion.  Dr. Mechele Collin is Medical Director for Pulmonary Rehab at [facility].

## 2023-09-07 NOTE — Patient Instructions (Addendum)
Thank you for coming in today.   You received an injection today. Seek immediate medical attention if the joint becomes red, extremely painful, or is oozing fluid.   Continue working on home exercises  Check back as needed

## 2023-09-13 ENCOUNTER — Encounter (HOSPITAL_COMMUNITY)
Admission: RE | Admit: 2023-09-13 | Discharge: 2023-09-13 | Disposition: A | Discharge status: Home / Self Care | Payer: Medicare Other | Source: Ambulatory Visit | Attending: Internal Medicine | Admitting: Internal Medicine

## 2023-09-13 ENCOUNTER — Encounter (HOSPITAL_COMMUNITY): Payer: Medicare Other

## 2023-09-13 DIAGNOSIS — J449 Chronic obstructive pulmonary disease, unspecified: Secondary | ICD-10-CM | POA: Diagnosis present

## 2023-09-13 NOTE — Progress Notes (Signed)
Daily Session Note  Patient Details  Name: Austin [name] MRN: [id number] Date of Birth: Nov 26, 1940 Referring Provider:   Doristine Devoid Pulmonary Rehab Walk Test from 07/29/2023 in [facility]  Referring Provider [name]       Encounter Date: 09/13/2023  Check In:  Session Check In - 09/13/23 1423       Check-In   Supervising physician immediately available to respond to emergencies [facility] MD immediately available    Physician(s) Eligha Bridegroom, NP    Location [facility]    Staff Present Raford Pitcher, MS, ACSM-CEP, Exercise Physiologist;Mary Gerre Scull, RN, BSN;Braylon Lemmons BS, ACSM-CEP, Exercise Physiologist;David [place], MS, ACSM-CEP, CCRP, Exercise Physiologist    Virtual Visit No    Medication changes reported     No    Fall or balance concerns reported    No    Tobacco Cessation No Change    Warm-up and Cool-down Performed as group-led instruction    Resistance Training Performed Yes    VAD Patient? No    PAD/SET Patient? No      Pain Assessment   Currently in Pain? No/denies    Multiple Pain Sites No             Capillary Blood Glucose: No results found for this or any previous visit (from the past 24 hour(s)).    Social History   Tobacco Use  Smoking Status Former   Types: Cigars   Quit date: 10/22/2019   Years since quitting: 3.8  Smokeless Tobacco Never  Tobacco Comments   smoked occ. with son. did not tolerate it long. 12/14/19.    Goals Met:  Independence with exercise equipment Exercise tolerated well No report of concerns or symptoms today Strength training completed today  Goals Unmet:  Not Applicable  Comments: Service time is from 1310 to 1434.    Dr. Mechele Collin is Medical Director for Pulmonary Rehab at [facility].

## 2023-09-15 ENCOUNTER — Encounter (HOSPITAL_COMMUNITY): Payer: Medicare Other

## 2023-09-15 ENCOUNTER — Encounter (HOSPITAL_COMMUNITY)
Admission: RE | Admit: 2023-09-15 | Discharge: 2023-09-15 | Disposition: A | Discharge status: Home / Self Care | Payer: Medicare Other | Source: Ambulatory Visit | Attending: Internal Medicine | Admitting: Internal Medicine

## 2023-09-15 DIAGNOSIS — J449 Chronic obstructive pulmonary disease, unspecified: Secondary | ICD-10-CM | POA: Diagnosis not present

## 2023-09-15 NOTE — Progress Notes (Signed)
Daily Session Note  Patient Details  Name: Austin [name] MRN: [id number] Date of Birth: [birth year] Referring Provider:   Doristine Devoid Pulmonary Rehab Walk Test from 07/29/2023 in [facility], Vascular, & Lung [facility]  Referring Provider [name]       Encounter Date: 09/15/2023  Check In:  Session Check In - 09/15/23 1331       Check-In   Supervising physician immediately available to respond to emergencies [facility] MD immediately available    Physician(s) Edd Fabian, NP    Location [facility]    Staff Present Raford Pitcher, MS, ACSM-CEP, Exercise Physiologist;Mary Gerre Scull, RN, BSN;Randi Idelle Crouch BS, ACSM-CEP, Exercise Physiologist;Greycen Felter Katrinka Blazing, RT    Virtual Visit No    Medication changes reported     No    Fall or balance concerns reported    No    Tobacco Cessation No Change    Warm-up and Cool-down Performed as group-led instruction    Resistance Training Performed Yes    VAD Patient? No    PAD/SET Patient? No      Pain Assessment   Currently in Pain? No/denies    Multiple Pain Sites No             Capillary Blood Glucose: No results found for this or any previous visit (from the past 24 hour(s)).    Social History   Tobacco Use  Smoking Status Former   Types: Cigars   Quit date: 10/22/2019   Years since quitting: 3.9  Smokeless Tobacco Never  Tobacco Comments   smoked occ. with son. did not tolerate it long. 12/14/19.    Goals Met:  Proper associated with RPD/PD & O2 Sat Independence with exercise equipment Exercise tolerated well No report of concerns or symptoms today Strength training completed today  Goals Unmet:  Not Applicable  Comments: Service time is from 1308 to 1443.    Dr. Mechele Collin is Medical Director for Pulmonary Rehab at [facility].

## 2023-09-20 ENCOUNTER — Encounter (HOSPITAL_COMMUNITY): Payer: Medicare Other

## 2023-09-20 ENCOUNTER — Encounter (HOSPITAL_COMMUNITY)
Admission: RE | Admit: 2023-09-20 | Discharge: 2023-09-20 | Disposition: A | Discharge status: Home / Self Care | Payer: Medicare Other | Source: Ambulatory Visit | Attending: Internal Medicine

## 2023-09-20 VITALS — Wt 188.5 lb

## 2023-09-20 DIAGNOSIS — J449 Chronic obstructive pulmonary disease, unspecified: Secondary | ICD-10-CM | POA: Diagnosis not present

## 2023-09-20 NOTE — Progress Notes (Signed)
Daily Session Note  Patient Details  Name: Austin [name] MRN: [id number] Date of Birth: October 16, 1940 Referring Provider:   Doristine [name] Pulmonary Rehab Walk Test from 07/29/2023 in [facility]  Referring Provider Austin [name]       Encounter Date: 09/20/2023  Check In:  Session Check In - 09/20/23 1318       Check-In   Supervising physician immediately available to respond to emergencies [facility] MD immediately available    Physician(s) Austin Barrios, NP    Location [facility]    Staff Present Austin Pitcher, MS, ACSM-CEP, Exercise Physiologist;Austin Gerre Scull, RN, BSN;Austin [name] BS, ACSM-CEP, Exercise Physiologist;Austin [name], RT    Virtual Visit No    Medication changes reported     No    Fall or balance concerns reported    No    Tobacco Cessation No Change    Warm-up and Cool-down Performed as group-led instruction    Resistance Training Performed Yes    VAD Patient? No    PAD/SET Patient? No      Pain Assessment   Currently in Pain? No/denies    Multiple Pain Sites No             Capillary Blood Glucose: No results found for this or any previous visit (from the past 24 hour(s)).   Exercise Prescription Changes - 09/20/23 1500       Response to Exercise   Blood Pressure (Admit) 148/70    Blood Pressure (Exercise) 168/70    Blood Pressure (Exit) 121/70    Heart Rate (Admit) 86 bpm    Heart Rate (Exercise) 117 bpm    Heart Rate (Exit) 108 bpm    Oxygen Saturation (Admit) 96 %    Oxygen Saturation (Exercise) 91 %    Oxygen Saturation (Exit) 95 %    Rating of Perceived Exertion (Exercise) 13    Perceived Dyspnea (Exercise) 3    Duration Continue with 30 min of aerobic exercise without signs/symptoms of physical distress.    Intensity THRR New   55/125     Progression   Progression Continue to progress workloads to maintain intensity without signs/symptoms of physical distress.       Resistance Training   Training Prescription Yes    Weight black bands    Reps 10-15    Time 10 Minutes      Treadmill   MPH 2    Grade 3.5    Minutes 15    METs 3.5      NuStep   Level 4    Minutes 15    METs 2.9             Social History   Tobacco Use  Smoking Status Former   Types: Cigars   Quit date: 10/22/2019   Years since quitting: 3.9  Smokeless Tobacco Never  Tobacco Comments   smoked occ. with son. did not tolerate it long. 12/14/19.    Goals Met:  Independence with exercise equipment Exercise tolerated well No report of concerns or symptoms today Strength training completed today  Goals Unmet:  Not Applicable  Comments: Service time is from 1310 to 1438.    Dr. Mechele [name] is Medical Director for Pulmonary Rehab at [facility].

## 2023-09-22 ENCOUNTER — Encounter (HOSPITAL_COMMUNITY): Payer: Medicare Other

## 2023-09-22 ENCOUNTER — Encounter (HOSPITAL_COMMUNITY)
Admission: RE | Admit: 2023-09-22 | Discharge: 2023-09-22 | Disposition: A | Discharge status: Home / Self Care | Payer: Medicare Other | Source: Ambulatory Visit | Attending: Internal Medicine | Admitting: Internal Medicine

## 2023-09-22 DIAGNOSIS — J449 Chronic obstructive pulmonary disease, unspecified: Secondary | ICD-10-CM

## 2023-09-22 NOTE — Progress Notes (Signed)
Daily Session Note  Patient Details  Name: Austin [name] MRN: [id number] Date of Birth: 1941-09-30 Referring Provider:   Doristine Devoid Pulmonary Rehab Walk Test from 07/29/2023 in [facility]  Referring Provider [name]       Encounter Date: 09/22/2023  Check In:  Session Check In - 09/22/23 1322       Check-In   Supervising physician immediately available to respond to emergencies [facility] MD immediately available    Physician(s) Joni Reining NP    Location [facility]    Staff Present Raford Pitcher, MS, ACSM-CEP, Exercise Physiologist;Mary Gerre Scull, RN, BSN;Randi Idelle Crouch BS, ACSM-CEP, Exercise Physiologist;Aleira Deiter Katrinka Blazing, RT    Virtual Visit No    Medication changes reported     No    Fall or balance concerns reported    No    Tobacco Cessation No Change    Warm-up and Cool-down Performed as group-led instruction    Resistance Training Performed Yes    VAD Patient? No    PAD/SET Patient? No      Pain Assessment   Currently in Pain? No/denies             Capillary Blood Glucose: No results found for this or any previous visit (from the past 24 hours).    Social History   Tobacco Use  Smoking Status Former   Types: Cigars   Quit date: 10/22/2019   Years since quitting: 3.9  Smokeless Tobacco Never  Tobacco Comments   smoked occ. with son. did not tolerate it long. 12/14/19.    Goals Met:  Proper associated with RPD/PD & O2 Sat Independence with exercise equipment Exercise tolerated well No report of concerns or symptoms today Strength training completed today  Goals Unmet:  Not Applicable  Comments: Service time is from 1308 to 1435.    Dr. Mechele Collin is Medical Director for Pulmonary Rehab at [facility].

## 2023-09-27 ENCOUNTER — Encounter (HOSPITAL_COMMUNITY)
Admission: RE | Admit: 2023-09-27 | Discharge: 2023-09-27 | Disposition: A | Discharge status: Home / Self Care | Payer: Medicare Other | Source: Ambulatory Visit | Attending: Internal Medicine

## 2023-09-27 ENCOUNTER — Ambulatory Visit (INDEPENDENT_AMBULATORY_CARE_PROVIDER_SITE_OTHER): Payer: Medicare Other

## 2023-09-27 VITALS — Wt 188.0 lb

## 2023-09-27 DIAGNOSIS — J449 Chronic obstructive pulmonary disease, unspecified: Secondary | ICD-10-CM

## 2023-09-27 DIAGNOSIS — Z0000 Encounter for general adult medical examination without abnormal findings: Secondary | ICD-10-CM | POA: Diagnosis not present

## 2023-09-27 NOTE — Progress Notes (Signed)
Daily Session Note  Patient Details  Name: Austin [name] MRN: [id number] Date of Birth: 12/29/1940 Referring Provider:   Doristine Devoid Pulmonary Rehab Walk Test from 07/29/2023 in [facility]  Referring Provider [name]       Encounter Date: 09/27/2023  Check In:  Session Check In - 09/27/23 1320       Check-In   Supervising physician immediately available to respond to emergencies [facility] MD immediately available    Physician(s) Bernadene Person, NP    [facility]    Staff Present Raford Pitcher, MS, ACSM-CEP, Exercise Physiologist;Soma Lizak Gerre Scull, RN, BSN;Randi Idelle Crouch BS, ACSM-CEP, Exercise Physiologist;Casey Katrinka Blazing, RT    Virtual Visit No    Medication changes reported     No    Fall or balance concerns reported    No    Tobacco Cessation No Change    Warm-up and Cool-down Performed as group-led instruction    Resistance Training Performed Yes    VAD Patient? No    PAD/SET Patient? No      Pain Assessment   Currently in Pain? No/denies    Multiple Pain Sites No             Capillary Blood Glucose: No results found for this or any previous visit (from the past 24 hours).    Social History   Tobacco Use  Smoking Status Former   Types: Cigars   Quit date: 10/22/2019   Years since quitting: 3.9  Smokeless Tobacco Never  Tobacco Comments   smoked occ. with son. did not tolerate it long. 12/14/19.    Goals Met:  Exercise tolerated well No report of concerns or symptoms today Strength training completed today  Goals Unmet:  Not Applicable  Comments: Service time is from 1312 to 1439    Dr. Mechele Collin is Medical Director for Pulmonary Rehab at [facility].

## 2023-09-27 NOTE — Patient Instructions (Signed)
Mr. Memon , Thank you for taking time to come for your Medicare Wellness Visit. I appreciate your ongoing commitment to your health goals. Please review the following plan we discussed and let me know if I can assist you in the future.   Referrals/Orders/Follow-Ups/Clinician Recommendations: maintain health and activity and keep up with my wife   This is a list of the screening recommended for you and due dates:  Health Maintenance  Topic Date Due   Medicare Annual Wellness Visit  09/22/2023   DTaP/Tdap/Td vaccine (2 - Td or Tdap) 11/04/2032   Pneumonia Vaccine  Completed   Flu Shot  Completed   HPV Vaccine  Aged Out   COVID-19 Vaccine  Discontinued   Zoster (Shingles) Vaccine  Discontinued    Advanced directives: (Copy Requested) Please bring a copy of your health care power of attorney and living will to the office to be added to your chart at your convenience.  Next Medicare Annual Wellness Visit scheduled for next year: Yes

## 2023-09-27 NOTE — Progress Notes (Signed)
Subjective:   Austin [name] is a 82 y.o. male who presents for Medicare Annual/Subsequent preventive examination.  Visit Complete: Virtual I connected with  Marina Goodell on 09/27/23 by a audio enabled telemedicine application and verified that I am speaking with the correct person using two identifiers.  Patient Location: Home  Provider Location: Office/Clinic  I discussed the limitations of evaluation and management by telemedicine. The patient expressed understanding and agreed to proceed.  Vital Signs: Because this visit was a virtual/telehealth visit, some criteria may be missing or patient reported. Any vitals not documented were not able to be obtained and vitals that have been documented are patient reported.   Cardiac Risk Factors include: advanced age (>50men, >95 women);male gender;dyslipidemia;hypertension     Objective:    Today's Vitals   09/27/23 1112  Weight: 188 lb (85.3 kg)   Body mass index is 27.76 kg/m.     09/27/2023   11:19 AM 07/29/2023    1:23 PM 01/12/2023   11:10 AM 09/21/2022   12:55 PM 09/11/2021    1:40 PM 08/04/2020   10:27 AM 02/19/2020    1:24 PM  Advanced Directives  Does Patient Have a Medical Advance Directive? Yes Yes Yes Yes Yes Yes No  Type of Estate agent of [place];Living will Living will Living will Healthcare Power of [place];Living will Living will Healthcare Power of [place];Living will   Copy of Healthcare Power of Attorney in Chart? No - copy requested   No - copy requested No - copy requested No - copy requested   Would patient like information on creating a medical advance directive?       No - Patient declined    Current Medications (verified) Outpatient Encounter Medications as of 09/27/2023  Medication Sig   albuterol (PROVENTIL) (2.5 MG/3ML) 0.083% nebulizer solution USE 1 VIAL BY NEBULIZATION EVERY 4 HOURS AS NEEDED FOR WHEEZING OR SHORTNESS OF BREATH/DYSPNEA. J45.998   albuterol (VENTOLIN  HFA) 108 (90 Base) MCG/ACT inhaler INHALE 1-2 PUFFS BY MOUTH EVERY 6 HOURS AS NEEDED FOR WHEEZE OR SHORTNESS OF BREATH   amLODipine (NORVASC) 5 MG tablet TAKE 1 TABLET (5 MG TOTAL) BY MOUTH DAILY.   atorvastatin (LIPITOR) 80 MG tablet TAKE 1 TABLET BY MOUTH EVERY DAY   baclofen (LIORESAL) 10 MG tablet Take 0.5-1 tablets (5-10 mg total) by mouth 4 (four) times daily as needed for muscle spasms.   BREZTRI AEROSPHERE 160-9-4.8 MCG/ACT AERO INHALE 2 PUFFS INTO THE LUNGS IN THE MORNING AND AT BEDTIME.   Cyanocobalamin (VITAMIN B 12 PO) Take 1,000 Units by mouth.   fenofibrate micronized (LOFIBRA) 200 MG capsule TAKE 1 CAPSULE BY MOUTH EVERY DAY   glucose blood (FREESTYLE LITE) test strip CHECK BLOOD SUGAR THREE TIMES A DAY E11.9   ibuprofen (ADVIL) 800 MG tablet TAKE 1 TABLET BY MOUTH EVERY 8 HOURS AS NEEDED   Lancets Misc. (UNISTIK 2 EXTRA) MISC Use to check blood sugar 3 times daily/ Dx E11.9   loratadine (CLARITIN) 10 MG tablet TAKE 1 TABLET BY MOUTH EVERY DAY   losartan (COZAAR) 100 MG tablet TAKE 1 TABLET BY MOUTH EVERY DAY   magnesium oxide (MAG-OX) 400 (240 Mg) MG tablet TAKE 1 TABLET BY MOUTH EVERY DAY   montelukast (SINGULAIR) 10 MG tablet TAKE 1 TABLET BY MOUTH EVERYDAY AT BEDTIME   nitrofurantoin, macrocrystal-monohydrate, (MACROBID) 100 MG capsule Take 100 mg by mouth daily.   omeprazole (PRILOSEC) 20 MG capsule TAKE 2 CAPSULES BY MOUTH EVERY DAY   solifenacin (VESICARE)  5 MG tablet Take 5 mg by mouth daily.   Spacer/Aero-Holding Chambers DEVI 1 each by Does not apply route as directed.   Vitamin D, Cholecalciferol, 50 MCG (2000 UT) CAPS Take by mouth.   gabapentin (NEURONTIN) 100 MG capsule TAKE 1 CAPSULE BY MOUTH AT BEDTIME. (Patient not taking: Reported on 09/27/2023)   ondansetron (ZOFRAN) 4 MG tablet Take 1 tablet (4 mg total) by mouth every 8 (eight) hours as needed for nausea or vomiting. (Patient not taking: Reported on 09/27/2023)   No facility-administered encounter medications  on file as of 09/27/2023.    Allergies (verified) Patient has no known allergies.   History: Past Medical History:  Diagnosis Date   Asthma    Depression    Hypertension    Prostate cancer (HCC)    Urinary incontinence    Past Surgical History:  Procedure Laterality Date   GALLBLADDER SURGERY  1997   REPLACEMENT TOTAL KNEE BILATERAL  2001   2007   surgery replacement Bilateral 2005   TOTAL SHOULDER REPLACEMENT Bilateral 2000   Family History  Problem Relation Age of Onset   Diabetes Brother    Heart disease Paternal Grandfather    Asthma Neg Hx    Social History   Socioeconomic History   Marital status: Married    Spouse name: Not on file   Number of children: Not on file   Years of education: Not on file   Highest education level: 12th grade  Occupational History   Occupation: retired  Tobacco Use   Smoking status: Former    Types: Cigars    Quit date: 10/22/2019    Years since quitting: 3.9   Smokeless tobacco: Never   Tobacco comments:    smoked occ. with son. did not tolerate it long. 12/14/19.  Vaping Use   Vaping status: Never Used  Substance and Sexual Activity   Alcohol use: Yes   Drug use: Never   Sexual activity: Not Currently  Other Topics Concern   Not on file  Social History Narrative   Not on file   Social Drivers of Health   Financial Resource Strain: Low Risk  (09/27/2023)   Overall Financial Resource Strain (CARDIA)    Difficulty of Paying Living Expenses: Not hard at all  Food Insecurity: No Food Insecurity (09/27/2023)   Hunger Vital Sign    Worried About Running Out of Food in the Last Year: Never true    Ran Out of Food in the Last Year: Never true  Transportation Needs: No Transportation Needs (09/27/2023)   PRAPARE - Administrator, Civil Service (Medical): No    Lack of Transportation (Non-Medical): No  Physical Activity: Insufficiently Active (09/27/2023)   Exercise Vital Sign    Days of Exercise per Week: 3  days    Minutes of Exercise per Session: 30 min  Stress: No Stress Concern Present (09/27/2023)   [employer] of Occupational Health - Occupational Stress Questionnaire    Feeling of Stress : Only a little  Social Connections: Moderately Isolated (09/27/2023)   Social Connection and Isolation Panel [NHANES]    Frequency of Communication with Friends and Family: More than three times a week    Frequency of Social Gatherings with Friends and Family: More than three times a week    Attends Religious Services: Never    Database administrator or Organizations: No    Attends Banker Meetings: Never    Marital Status: Married    Tobacco  Counseling Counseling given: Not Answered Tobacco comments: smoked occ. with son. did not tolerate it long. 12/14/19.   Clinical Intake:  Pre-visit preparation completed: Yes  Pain : No/denies pain     BMI - recorded: 27.76 Nutritional Status: BMI 25 -29 Overweight Nutritional Risks: None Diabetes: No  How often do you need to have someone help you when you read instructions, pamphlets, or other written materials from your doctor or pharmacy?: 1 - Never  Interpreter Needed?: No  Information entered by :: Lanier Ensign, LPN   Activities of Daily Living    09/27/2023   11:15 AM  In your present state of health, do you have any difficulty performing the following activities:  Hearing? 1  Comment hearing aids  Vision? 0  Difficulty concentrating or making decisions? 0  Walking or climbing stairs? 0  Dressing or bathing? 0  Doing errands, shopping? 0  Preparing Food and eating ? N  Using the Toilet? N  In the past six months, have you accidently leaked urine? Y  Comment wears briefs  Do you have problems with loss of bowel control? N  Managing your Medications? N  Managing your Finances? N  Housekeeping or managing your Housekeeping? N    Patient Care Team: Ardith Dark, MD as PCP - General (Family  Medicine)  Indicate any recent Medical Services you may have received from other than [facility] providers in the past year (date may be approximate).     Assessment:   This is a routine wellness examination for [facility].  Hearing/Vision screen Hearing Screening - Comments:: Pt wears hearing aids  Vision Screening - Comments:: Pt follows up with Dr Dione Booze for annual eye exams    Goals Addressed             This Visit's Progress    Patient Stated       Maintain health and activity        Depression Screen    09/27/2023   11:19 AM 07/29/2023    2:32 PM 03/31/2023    1:54 PM 03/31/2023    1:42 PM 10/27/2022    9:32 AM 09/21/2022   12:54 PM 07/23/2022    1:50 PM  PHQ 2/9 Scores  PHQ - 2 Score 1 2 0 0 0 0 0  PHQ- 9 Score  4 0  0 0 0    Fall Risk    09/27/2023   11:21 AM 09/22/2023    1:23 PM 09/20/2023    1:18 PM 09/15/2023    1:32 PM 09/13/2023    2:00 PM  Fall Risk   Falls in the past year? 1 0 0 0 0  Number falls in past yr: 1 0 0 0 0  Injury with Fall? 0 0 0 0 0  Comment rolled over with exercise      Risk for fall due to : History of fall(s);Impaired balance/gait Impaired balance/gait;Orthopedic patient Impaired balance/gait;Orthopedic patient Impaired balance/gait;Orthopedic patient Impaired balance/gait;Orthopedic patient  Follow up Falls prevention discussed Falls evaluation completed;Falls prevention discussed Falls evaluation completed;Falls prevention discussed Falls evaluation completed Falls evaluation completed    MEDICARE RISK AT HOME: Medicare Risk at Home Any stairs in or around the home?: Yes If so, are there any without handrails?: No Home free of loose throw rugs in walkways, pet beds, electrical cords, etc?: Yes Adequate lighting in your home to reduce risk of falls?: Yes Life alert?: No Use of a cane, walker or w/c?: Yes Grab bars in the bathroom?: Yes  Shower chair or bench in shower?: Yes Elevated toilet seat or a handicapped toilet?: No  TIMED  UP AND GO:  Was the test performed?  No    Cognitive Function:        09/27/2023   11:23 AM 09/21/2022   12:58 PM 09/11/2021    1:43 PM 08/04/2020   10:32 AM  6CIT Screen  What Year? 0 points 0 points 0 points 0 points  What month? 0 points 0 points 0 points 0 points  What time? 0 points 0 points 0 points   Count back from 20 0 points 0 points 0 points 0 points  Months in reverse 0 points 4 points 2 points 4 points  Repeat phrase 0 points 0 points 2 points 2 points  Total Score 0 points 4 points 4 points     Immunizations Immunization History  Administered Date(s) Administered   Fluad Quad(high Dose 65+) 07/24/2019   Fluad Trivalent(High Dose 65+) 06/28/2023   Influenza, High Dose Seasonal PF 05/31/2021, 07/13/2022   Influenza-Unspecified 06/04/2020   PFIZER(Purple Top)SARS-COV-2 Vaccination 11/23/2019, 12/17/2019, 07/26/2020   PNEUMOCOCCAL CONJUGATE-20 02/10/2021   Tdap 11/04/2022   Zoster Recombinant(Shingrix) 01/14/2022, 04/05/2022    TDAP status: Up to date  Flu Vaccine status: Up to date  Pneumococcal vaccine status: Up to date  Covid-19 vaccine status: Information provided on how to obtain vaccines.   Qualifies for Shingles Vaccine? Yes   Zostavax completed Yes   Shingrix Completed?: Yes  Screening Tests Health Maintenance  Topic Date Due   Medicare Annual Wellness (AWV)  09/26/2024   DTaP/Tdap/Td (2 - Td or Tdap) 11/04/2032   Pneumonia Vaccine 80+ Years old  Completed   INFLUENZA VACCINE  Completed   HPV VACCINES  Aged Out   COVID-19 Vaccine  Discontinued   Zoster Vaccines- Shingrix  Discontinued    Health Maintenance  There are no preventive care reminders to display for this patient.   Colorectal cancer screening: No longer required.    Additional Screening:  Vision Screening: Recommended annual ophthalmology exams for early detection of glaucoma and other disorders of the eye. Is the patient up to date with their annual eye exam?  Yes   Who is the provider or what is the name of the office in which the patient attends annual eye exams? Dr Dione Booze  If pt is not established with a provider, would they like to be referred to a provider to establish care? No .   Dental Screening: Recommended annual dental exams for proper oral hygiene   Community Resource Referral / Chronic Care Management: CRR required this visit?  No   CCM required this visit?  No     Plan:     I have personally reviewed and noted the following in the patient's chart:   Medical and social history Use of alcohol, tobacco or illicit drugs  Current medications and supplements including opioid prescriptions. Patient is not currently taking opioid prescriptions. Functional ability and status Nutritional status Physical activity Advanced directives List of other physicians Hospitalizations, surgeries, and ER visits in previous 12 months Vitals Screenings to include cognitive, depression, and falls Referrals and appointments  In addition, I have reviewed and discussed with patient certain preventive protocols, quality metrics, and best practice recommendations. A written personalized care plan for preventive services as well as general preventive health recommendations were provided to patient.     Marzella Schlein, LPN   13/24/4010   After Visit Summary: (MyChart) Due to this being a telephonic  visit, the after visit summary with patients personalized plan was offered to patient via MyChart   Nurse Notes: none

## 2023-09-28 ENCOUNTER — Other Ambulatory Visit: Payer: Self-pay | Admitting: *Deleted

## 2023-09-28 ENCOUNTER — Ambulatory Visit: Payer: Self-pay | Admitting: Family Medicine

## 2023-09-28 ENCOUNTER — Telehealth: Payer: Self-pay | Admitting: *Deleted

## 2023-09-28 NOTE — Telephone Encounter (Signed)
Please see message. °

## 2023-09-28 NOTE — Telephone Encounter (Signed)
Patient has been scheduled an OV w/ [name] on 09/29/23 @ 9:20 am to evaluate cough.

## 2023-09-28 NOTE — Telephone Encounter (Signed)
Reason for Disposition . Prescription request for new medicine (not a refill)  Answer Assessment - Initial Assessment Questions 1. NAME of MEDICINE: "What medicine(s) are you calling about?"     Prednisone - Medication is not listed under patient.  Protocols used: Medication Question Call-A-AH

## 2023-09-28 NOTE — Telephone Encounter (Signed)
Please schedule an OV with PCP for cough, if ok with patient with any other provider available

## 2023-09-28 NOTE — Telephone Encounter (Signed)
Copied from CRM [id number]. Topic: Clinical - Medication Question >> Sep 28, 2023  9:42 AM Kathryne Eriksson wrote: Reason for CRM:  Patient wants to speak with nurse >> Sep 28, 2023  9:42 AM Kathryne Eriksson wrote: Patient states he wants to speak with doctor Pocker's nurse about a medication strip    Left message to return call to our office at their convenience.

## 2023-09-28 NOTE — Telephone Encounter (Addendum)
   Chief Complaint: Patient requested Prednisone that is not listed under his medication. RN Agent attempted to call 3 times to triage patient. 3rd attempt made and will route to provider office to continue to outreach on the request for prednisone.   Copied from CRM [id number]. Topic: Clinical - Medication Refill >> Sep 28, 2023 10:44 AM Pascal Lux wrote: Most Recent Primary Care Visit:  Provider: Marzella Schlein  Department: [facility]  Visit Type: MEDICARE AWV, SEQUENTIAL  Date: 09/27/2023  Medication: Prednisone  Has the patient contacted their pharmacy? No (Agent: If no, request that the patient contact the pharmacy for the refill. If patient does not wish to contact the pharmacy document the reason why and proceed with request.) (Agent: If yes, when and what did the pharmacy advise?)  Is this the correct pharmacy for this prescription? Yes If no, delete pharmacy and type the correct one.  This is the patient's preferred pharmacy:  [id number] [place], [place] - [address] [place] [address] Phone: [phone] Fax: [phone]   Has the prescription been filled recently? No  Is the patient out of the medication? Yes  Has the patient been seen for an appointment in the last year OR does the patient have an upcoming appointment? Yes  Can we respond through MyChart? No  Agent: Please be advised that Rx refills may take up to 3 business days. We ask that you follow-up with your pharmacy.

## 2023-09-29 ENCOUNTER — Encounter (HOSPITAL_COMMUNITY)
Admission: RE | Admit: 2023-09-29 | Discharge: 2023-09-29 | Disposition: A | Discharge status: Home / Self Care | Payer: Medicare Other | Source: Ambulatory Visit | Attending: Internal Medicine | Admitting: Internal Medicine

## 2023-09-29 ENCOUNTER — Encounter: Payer: Self-pay | Admitting: Physician Assistant

## 2023-09-29 ENCOUNTER — Ambulatory Visit: Payer: Medicare Other | Admitting: Physician Assistant

## 2023-09-29 ENCOUNTER — Other Ambulatory Visit: Payer: Self-pay | Admitting: Family Medicine

## 2023-09-29 ENCOUNTER — Encounter (HOSPITAL_COMMUNITY): Payer: Medicare Other

## 2023-09-29 VITALS — BP 130/70 | HR 82 | Temp 97.5°F | Ht 69.0 in | Wt 186.4 lb

## 2023-09-29 VITALS — Wt 187.8 lb

## 2023-09-29 DIAGNOSIS — J4489 Other specified chronic obstructive pulmonary disease: Secondary | ICD-10-CM

## 2023-09-29 DIAGNOSIS — J449 Chronic obstructive pulmonary disease, unspecified: Secondary | ICD-10-CM | POA: Diagnosis not present

## 2023-09-29 MED ORDER — PREDNISONE 10 MG PO TABS
ORAL_TABLET | ORAL | 0 refills | Status: DC
Start: 2023-09-29 — End: 2023-12-06

## 2023-09-29 NOTE — Progress Notes (Signed)
Austin [name] is a 82 y.o. male here for a recurrence of a previously resolved problem.  History of Present Illness:   Chief Complaint  Patient presents with   Cough    Pt c/o cough x 3 days, expectorating yellow sputum. Denies fever or chills.    HPI  Cough:  Pt complains of productive cough with yellow sputum ongoing 3 days.  Has taken cough medicine to resolve symptoms.  Since sx started, has not had to increase frequency of albuterol use. Pt is interested in starting prednisone, states it has helped in the past.  Hx of asthma and COPD.  Goes to pulmonary class on Tues/Thurs. Denies any fever or chills.   Past Medical History:  Diagnosis Date   Asthma    Depression    Hypertension    Prostate cancer (HCC)    Urinary incontinence      Social History   Tobacco Use   Smoking status: Former    Types: Cigars    Quit date: 10/22/2019    Years since quitting: 3.9   Smokeless tobacco: Never   Tobacco comments:    smoked occ. with son. did not tolerate it long. 12/14/19.  Vaping Use   Vaping status: Never Used  Substance Use Topics   Alcohol use: Yes   Drug use: Never    Past Surgical History:  Procedure Laterality Date   GALLBLADDER SURGERY  1997   REPLACEMENT TOTAL KNEE BILATERAL  2001   2007   surgery replacement Bilateral 2005   TOTAL SHOULDER REPLACEMENT Bilateral 2000    Family History  Problem Relation Age of Onset   Diabetes Brother    Heart disease Paternal Grandfather    Asthma Neg Hx     No Known Allergies  Current Medications:   Current Outpatient Medications:    albuterol (PROVENTIL) (2.5 MG/3ML) 0.083% nebulizer solution, USE 1 VIAL BY NEBULIZATION EVERY 4 HOURS AS NEEDED FOR WHEEZING OR SHORTNESS OF BREATH/DYSPNEA. J45.998, Disp: 375 mL, Rfl: 1   albuterol (VENTOLIN HFA) 108 (90 Base) MCG/ACT inhaler, INHALE 1-2 PUFFS BY MOUTH EVERY 6 HOURS AS NEEDED FOR WHEEZE OR SHORTNESS OF BREATH, Disp: 18 each, Rfl: 5   amLODipine (NORVASC) 5 MG  tablet, TAKE 1 TABLET (5 MG TOTAL) BY MOUTH DAILY., Disp: 90 tablet, Rfl: 3   atorvastatin (LIPITOR) 80 MG tablet, TAKE 1 TABLET BY MOUTH EVERY DAY, Disp: 90 tablet, Rfl: 1   baclofen (LIORESAL) 10 MG tablet, Take 0.5-1 tablets (5-10 mg total) by mouth 4 (four) times daily as needed for muscle spasms., Disp: 30 each, Rfl: 0   BREZTRI AEROSPHERE 160-9-4.8 MCG/ACT AERO, INHALE 2 PUFFS INTO THE LUNGS IN THE MORNING AND AT BEDTIME., Disp: 10.7 each, Rfl: 1   Cyanocobalamin (VITAMIN B 12 PO), Take 1,000 Units by mouth., Disp: , Rfl:    fenofibrate micronized (LOFIBRA) 200 MG capsule, TAKE 1 CAPSULE BY MOUTH EVERY DAY, Disp: 90 capsule, Rfl: 0   gabapentin (NEURONTIN) 100 MG capsule, TAKE 1 CAPSULE BY MOUTH AT BEDTIME., Disp: 30 capsule, Rfl: 0   glucose blood (FREESTYLE LITE) test strip, CHECK BLOOD SUGAR THREE TIMES A DAY E11.9, Disp: 100 strip, Rfl: 12   ibuprofen (ADVIL) 800 MG tablet, TAKE 1 TABLET BY MOUTH EVERY 8 HOURS AS NEEDED, Disp: 30 tablet, Rfl: 0   Lancets Misc. (UNISTIK 2 EXTRA) MISC, Use to check blood sugar 3 times daily/ Dx E11.9, Disp: 200 each, Rfl: 0   loratadine (CLARITIN) 10 MG tablet, TAKE 1 TABLET BY MOUTH  EVERY DAY, Disp: 90 tablet, Rfl: 3   losartan (COZAAR) 100 MG tablet, TAKE 1 TABLET BY MOUTH EVERY DAY, Disp: 90 tablet, Rfl: 3   magnesium oxide (MAG-OX) 400 (240 Mg) MG tablet, TAKE 1 TABLET BY MOUTH EVERY DAY, Disp: 90 tablet, Rfl: 3   montelukast (SINGULAIR) 10 MG tablet, TAKE 1 TABLET BY MOUTH EVERYDAY AT BEDTIME, Disp: 90 tablet, Rfl: 1   nitrofurantoin, macrocrystal-monohydrate, (MACROBID) 100 MG capsule, Take 100 mg by mouth daily., Disp: , Rfl:    omeprazole (PRILOSEC) 20 MG capsule, TAKE 2 CAPSULES BY MOUTH EVERY DAY, Disp: 180 capsule, Rfl: 1   ondansetron (ZOFRAN) 4 MG tablet, Take 1 tablet (4 mg total) by mouth every 8 (eight) hours as needed for nausea or vomiting., Disp: 20 tablet, Rfl: 0   predniSONE (DELTASONE) 10 MG tablet, 4 tabs for 2 days, then 3 tabs for 2  days, 2 tabs for 2 days, then 1 tab for 2 days, then stop, Disp: 20 tablet, Rfl: 0   solifenacin (VESICARE) 5 MG tablet, Take 5 mg by mouth daily., Disp: , Rfl:    Spacer/Aero-Holding Chambers DEVI, 1 each by Does not apply route as directed., Disp: 1 each, Rfl: 1   Vitamin D, Cholecalciferol, 50 MCG (2000 UT) CAPS, Take by mouth., Disp: , Rfl:    Review of Systems:   ROS Negative unless otherwise specified per HPI.  Vitals:   Vitals:   09/29/23 0929  BP: 130/70  Pulse: 82  Temp: (!) 97.5 F (36.4 C)  TempSrc: Temporal  SpO2: 99%  Weight: 186 lb 6.1 oz (84.5 kg)  Height: 5\' 9"  (1.753 m)     Body mass index is 27.52 kg/m.  Physical Exam:   Physical Exam Vitals and nursing note reviewed.  Constitutional:      General: He is not in acute distress.    Appearance: He is well-developed. He is not ill-appearing or toxic-appearing.  Cardiovascular:     Rate and Rhythm: Normal rate and regular rhythm.     Pulses: Normal pulses.     Heart sounds: Normal heart sounds, S1 normal and S2 normal.  Pulmonary:     Effort: Pulmonary effort is normal.     Breath sounds: Normal breath sounds.  Skin:    General: Skin is warm and dry.  Neurological:     Mental Status: He is alert.     GCS: GCS eye subscore is 4. GCS verbal subscore is 5. GCS motor subscore is 6.  Psychiatric:        Speech: Speech normal.        Behavior: Behavior normal. Behavior is cooperative.     Assessment and Plan:   Asthma-COPD overlap syndrome (HCC) No red flags on exam.   Will initiate prednisone per orders.  Discussed taking medications as prescribed.  Reviewed return precautions including new or worsening fever, SOB, new or worsening cough or other concerns.  Push fluids and rest.  I recommend that patient follow-up if symptoms worsen or persist despite treatment x 7-10 days, sooner if needed.  I, Isabelle Course, acting as a Neurosurgeon for Austin [name], [name]., have documented all relevant documentation  on the behalf of Austin [name], [name], as directed by  Austin Motto, PA while in the presence of Austin [name], [name].  I, Austin [name], [name], have reviewed all documentation for this visit. The documentation on 09/29/23 for the exam, diagnosis, procedures, and orders are all accurate and complete.  Austin Motto, PA-C

## 2023-09-29 NOTE — Progress Notes (Signed)
Daily Session Note  Patient Details  Name: Rhody Musleh MRN: [id number] Date of Birth: [birth year] Referring Provider:   Doristine Devoid Pulmonary Rehab Walk Test from 07/29/2023 in [facility]  Referring Provider [name]       Encounter Date: 09/29/2023  Check In:  Session Check In - 09/29/23 1326       Check-In   Supervising physician immediately available to respond to emergencies [facility] MD immediately available    Physician(s) Robin Searing, NP    Location [facility]    Staff Present Raford Pitcher, MS, ACSM-CEP, Exercise Physiologist;Mary Gerre Scull, RN, BSN;Randi Idelle Crouch BS, ACSM-CEP, Exercise Physiologist;Anaelle Dunton Katrinka Blazing, RT    Virtual Visit No    Medication changes reported     No    Fall or balance concerns reported    No    Tobacco Cessation No Change    Warm-up and Cool-down Performed as group-led instruction    Resistance Training Performed Yes    VAD Patient? No    PAD/SET Patient? No      Pain Assessment   Currently in Pain? No/denies    Multiple Pain Sites No             Capillary Blood Glucose: No results found for this or any previous visit (from the past 24 hours).    Social History   Tobacco Use  Smoking Status Former   Types: Cigars   Quit date: 10/22/2019   Years since quitting: 3.9  Smokeless Tobacco Never  Tobacco Comments   smoked occ. with son. did not tolerate it long. 12/14/19.    Goals Met:  Proper associated with RPD/PD & O2 Sat Independence with exercise equipment Exercise tolerated well No report of concerns or symptoms today Strength training completed today  Goals Unmet:  Not Applicable  Comments: Service time is from 1308 to 1440.    Dr. Mechele Collin is Medical Director for Pulmonary Rehab at [facility].

## 2023-10-03 ENCOUNTER — Ambulatory Visit: Payer: Medicare Other | Admitting: Family Medicine

## 2023-10-04 ENCOUNTER — Encounter (HOSPITAL_COMMUNITY): Payer: Medicare Other

## 2023-10-04 NOTE — Progress Notes (Signed)
Pulmonary Individual Treatment Plan  Patient Details  Name: Austin [name] MRN: [id number] Date of Birth: [birth year] Referring Provider:   Doristine Devoid Pulmonary Rehab Walk Test from 07/29/2023 in [facility]  Referring Provider [name]       Initial Encounter Date:  Flowsheet Row Pulmonary Rehab Walk Test from 07/29/2023 in [facility]  Date 07/29/23       Visit Diagnosis: Stage 2 moderate COPD by GOLD classification (HCC)  Patient's Home Medications on Admission:   Current Outpatient Medications:    albuterol (PROVENTIL) (2.5 MG/3ML) 0.083% nebulizer solution, USE 1 VIAL BY NEBULIZATION EVERY 4 HOURS AS NEEDED FOR WHEEZING OR SHORTNESS OF BREATH/DYSPNEA. J45.998, Disp: 375 mL, Rfl: 1   albuterol (VENTOLIN HFA) 108 (90 Base) MCG/ACT inhaler, INHALE 1-2 PUFFS BY MOUTH EVERY 6 HOURS AS NEEDED FOR WHEEZE OR SHORTNESS OF BREATH, Disp: 18 each, Rfl: 5   amLODipine (NORVASC) 5 MG tablet, TAKE 1 TABLET (5 MG TOTAL) BY MOUTH DAILY., Disp: 90 tablet, Rfl: 3   atorvastatin (LIPITOR) 80 MG tablet, TAKE 1 TABLET BY MOUTH EVERY DAY, Disp: 90 tablet, Rfl: 1   baclofen (LIORESAL) 10 MG tablet, Take 0.5-1 tablets (5-10 mg total) by mouth 4 (four) times daily as needed for muscle spasms., Disp: 30 each, Rfl: 0   BREZTRI AEROSPHERE 160-9-4.8 MCG/ACT AERO, INHALE 2 PUFFS INTO THE LUNGS IN THE MORNING AND AT BEDTIME., Disp: 10.7 each, Rfl: 1   Cyanocobalamin (VITAMIN B 12 PO), Take 1,000 Units by mouth., Disp: , Rfl:    fenofibrate micronized (LOFIBRA) 200 MG capsule, TAKE 1 CAPSULE BY MOUTH EVERY DAY, Disp: 90 capsule, Rfl: 0   gabapentin (NEURONTIN) 100 MG capsule, TAKE 1 CAPSULE BY MOUTH EVERYDAY AT BEDTIME, Disp: 30 capsule, Rfl: 0   glucose blood (FREESTYLE LITE) test strip, CHECK BLOOD SUGAR THREE TIMES A DAY E11.9, Disp: 100 strip, Rfl: 12   ibuprofen (ADVIL) 800 MG tablet, TAKE 1  TABLET BY MOUTH EVERY 8 HOURS AS NEEDED, Disp: 30 tablet, Rfl: 0   Lancets Misc. (UNISTIK 2 EXTRA) MISC, Use to check blood sugar 3 times daily/ Dx E11.9, Disp: 200 each, Rfl: 0   loratadine (CLARITIN) 10 MG tablet, TAKE 1 TABLET BY MOUTH EVERY DAY, Disp: 90 tablet, Rfl: 3   losartan (COZAAR) 100 MG tablet, TAKE 1 TABLET BY MOUTH EVERY DAY, Disp: 90 tablet, Rfl: 3   magnesium oxide (MAG-OX) 400 (240 Mg) MG tablet, TAKE 1 TABLET BY MOUTH EVERY DAY, Disp: 90 tablet, Rfl: 3   montelukast (SINGULAIR) 10 MG tablet, TAKE 1 TABLET BY MOUTH EVERYDAY AT BEDTIME, Disp: 90 tablet, Rfl: 1   nitrofurantoin, macrocrystal-monohydrate, (MACROBID) 100 MG capsule, Take 100 mg by mouth daily., Disp: , Rfl:    omeprazole (PRILOSEC) 20 MG capsule, TAKE 2 CAPSULES BY MOUTH EVERY DAY, Disp: 180 capsule, Rfl: 1   ondansetron (ZOFRAN) 4 MG tablet, Take 1 tablet (4 mg total) by mouth every 8 (eight) hours as needed for nausea or vomiting., Disp: 20 tablet, Rfl: 0   predniSONE (DELTASONE) 10 MG tablet, 4 tabs for 2 days, then 3 tabs for 2 days, 2 tabs for 2 days, then 1 tab for 2 days, then stop, Disp: 20 tablet, Rfl: 0   solifenacin (VESICARE) 5 MG tablet, Take 5 mg by mouth daily., Disp: , Rfl:    Spacer/Aero-Holding Chambers DEVI, 1 each by Does not apply route as directed., Disp: 1  each, Rfl: 1   Vitamin D, Cholecalciferol, 50 MCG (2000 UT) CAPS, Take by mouth., Disp: , Rfl:   Past Medical History: Past Medical History:  Diagnosis Date   Asthma    Depression    Hypertension    Prostate cancer (HCC)    Urinary incontinence     Tobacco Use: Social History   Tobacco Use  Smoking Status Former   Types: Cigars   Quit date: 10/22/2019   Years since quitting: 3.9  Smokeless Tobacco Never  Tobacco Comments   smoked occ. with son. did not tolerate it long. 12/14/19.    Labs: Review Flowsheet  More data exists      Latest Ref Rng & Units 02/10/2021 11/23/2021 04/23/2022 10/27/2022 03/31/2023  Labs for ITP Cardiac  and Pulmonary Rehab  Cholestrol 0 - 200 mg/dL 161  096  045  409  811   LDL (calc) 0 - 99 mg/dL 90  90  914  85  91   HDL-C >39.00 mg/dL 78.29  56.21  30.86  57.84  49.80   Trlycerides 0.0 - 149.0 mg/dL 69.6  29.5  28.4  132.4  83.0   Hemoglobin A1c 4.0 - 5.6 % 5.7  6.2  6.1  6.4  5.8     Capillary Blood Glucose: No results found for: "GLUCAP"   Pulmonary Assessment Scores:  Pulmonary Assessment Scores     Row Name 07/29/23 1341         ADL UCSD   ADL Phase Entry     SOB Score total 53       CAT Score   CAT Score 15       mMRC Score   mMRC Score 3             UCSD: Self-administered rating of dyspnea associated with activities of daily living (ADLs) 6-point scale (0 = "not at all" to 5 = "maximal or unable to do because of breathlessness")  Scoring Scores range from 0 to 120.  Minimally important difference is 5 units  CAT: CAT can identify the health impairment of COPD patients and is better correlated with disease progression.  CAT has a scoring range of zero to 40. The CAT score is classified into four groups of low (less than 10), medium (10 - 20), high (21-30) and very high (31-40) based on the impact level of disease on health status. A CAT score over 10 suggests significant symptoms.  A worsening CAT score could be explained by an exacerbation, poor medication adherence, poor inhaler technique, or progression of COPD or comorbid conditions.  CAT MCID is 2 points  mMRC: mMRC (Modified Medical Research Council) Dyspnea Scale is used to assess the degree of baseline functional disability in patients of respiratory disease due to dyspnea. No minimal important difference is established. A decrease in score of 1 point or greater is considered a positive change.   Pulmonary Function Assessment:  Pulmonary Function Assessment - 07/29/23 1341       Breath   Shortness of Breath Yes;Fear of Shortness of Breath;Limiting activity             Exercise Target  Goals: Exercise Program Goal: Individual exercise prescription set using results from initial 6 min walk test and THRR while considering  patient's activity barriers and safety.   Exercise Prescription Goal: Initial exercise prescription builds to 30-45 minutes a day of aerobic activity, 2-3 days per week.  Home exercise guidelines will be given to patient during program as  part of exercise prescription that the participant will acknowledge.  Activity Barriers & Risk Stratification:  Activity Barriers & Cardiac Risk Stratification - 07/29/23 1343       Activity Barriers & Cardiac Risk Stratification   Activity Barriers Left Knee Replacement;Right Knee Replacement;Shortness of Breath;Muscular Weakness;Deconditioning;Back Problems;Balance Concerns;Arthritis             6 Minute Walk:  6 Minute Walk     Row Name 07/29/23 1427         6 Minute Walk   Phase Initial     Distance 1170 feet     Walk Time 6 minutes     # of Rest Breaks 0     MPH 2.22     METS 2.9     RPE 15     Perceived Dyspnea  2     VO2 Peak 10.14     Symptoms No     Resting HR 81 bpm     Resting BP 138/68     Resting Oxygen Saturation  96 %     Exercise Oxygen Saturation  during 6 min walk 90 %     Max Ex. HR 128 bpm     Max Ex. BP 198/80     2 Minute Post BP 140/74       Interval HR   1 Minute HR 104     2 Minute HR 113     3 Minute HR 115     4 Minute HR 121     5 Minute HR 124     6 Minute HR 128     2 Minute Post HR 98     Interval Heart Rate? Yes       Interval Oxygen   Interval Oxygen? Yes     Baseline Oxygen Saturation % 96 %     1 Minute Oxygen Saturation % 96 %     1 Minute Liters of Oxygen 0 L     2 Minute Oxygen Saturation % 91 %     2 Minute Liters of Oxygen 0 L     3 Minute Oxygen Saturation % 91 %     3 Minute Liters of Oxygen 0 L     4 Minute Oxygen Saturation % 91 %     4 Minute Liters of Oxygen 0 L     5 Minute Oxygen Saturation % 90 %     5 Minute Liters of Oxygen 0 L      6 Minute Oxygen Saturation % 90 %     6 Minute Liters of Oxygen 0 L     2 Minute Post Oxygen Saturation % 95 %     2 Minute Post Liters of Oxygen 0 L              Oxygen Initial Assessment:  Oxygen Initial Assessment - 07/29/23 1340       Home Oxygen   Home Oxygen Device None    Sleep Oxygen Prescription None    Home Exercise Oxygen Prescription None    Home Resting Oxygen Prescription None      Initial 6 min Walk   Oxygen Used None      Program Oxygen Prescription   Program Oxygen Prescription None      Intervention   Short Term Goals To learn and understand importance of maintaining oxygen saturations>88%;To learn and demonstrate proper use of respiratory medications;To learn and understand importance of monitoring SPO2 with pulse oximeter and demonstrate  accurate use of the pulse oximeter.;To learn and demonstrate proper pursed lip breathing techniques or other breathing techniques.     Long  Term Goals Verbalizes importance of monitoring SPO2 with pulse oximeter and return demonstration;Maintenance of O2 saturations>88%;Exhibits proper breathing techniques, such as pursed lip breathing or other method taught during program session;Compliance with respiratory medication;Demonstrates proper use of MDI's             Oxygen Re-Evaluation:  Oxygen Re-Evaluation     Row Name 08/01/23 1506 08/30/23 0935 09/22/23 0919         Program Oxygen Prescription   Program Oxygen Prescription None None None       Home Oxygen   Home Oxygen Device None None None     Sleep Oxygen Prescription None None None     Home Exercise Oxygen Prescription None None None     Home Resting Oxygen Prescription None None None       Goals/Expected Outcomes   Short Term Goals To learn and understand importance of maintaining oxygen saturations>88%;To learn and demonstrate proper use of respiratory medications;To learn and understand importance of monitoring SPO2 with pulse oximeter and  demonstrate accurate use of the pulse oximeter.;To learn and demonstrate proper pursed lip breathing techniques or other breathing techniques.  To learn and understand importance of maintaining oxygen saturations>88%;To learn and demonstrate proper use of respiratory medications;To learn and understand importance of monitoring SPO2 with pulse oximeter and demonstrate accurate use of the pulse oximeter.;To learn and demonstrate proper pursed lip breathing techniques or other breathing techniques.  To learn and understand importance of maintaining oxygen saturations>88%;To learn and demonstrate proper use of respiratory medications;To learn and understand importance of monitoring SPO2 with pulse oximeter and demonstrate accurate use of the pulse oximeter.;To learn and demonstrate proper pursed lip breathing techniques or other breathing techniques.      Long  Term Goals Verbalizes importance of monitoring SPO2 with pulse oximeter and return demonstration;Maintenance of O2 saturations>88%;Exhibits proper breathing techniques, such as pursed lip breathing or other method taught during program session;Compliance with respiratory medication;Demonstrates proper use of MDI's Verbalizes importance of monitoring SPO2 with pulse oximeter and return demonstration;Maintenance of O2 saturations>88%;Exhibits proper breathing techniques, such as pursed lip breathing or other method taught during program session;Compliance with respiratory medication;Demonstrates proper use of MDI's Verbalizes importance of monitoring SPO2 with pulse oximeter and return demonstration;Maintenance of O2 saturations>88%;Exhibits proper breathing techniques, such as pursed lip breathing or other method taught during program session;Compliance with respiratory medication;Demonstrates proper use of MDI's     Goals/Expected Outcomes Compliance and understanding of oxygen saturation monitoring and breathing techniques to decrease shortness of breath  Compliance and understanding of oxygen saturation monitoring and breathing techniques to decrease shortness of breath Compliance and understanding of oxygen saturation monitoring and breathing techniques to decrease shortness of breath              Oxygen Discharge (Final Oxygen Re-Evaluation):  Oxygen Re-Evaluation - 09/22/23 0919       Program Oxygen Prescription   Program Oxygen Prescription None      Home Oxygen   Home Oxygen Device None    Sleep Oxygen Prescription None    Home Exercise Oxygen Prescription None    Home Resting Oxygen Prescription None      Goals/Expected Outcomes   Short Term Goals To learn and understand importance of maintaining oxygen saturations>88%;To learn and demonstrate proper use of respiratory medications;To learn and understand importance of monitoring SPO2 with pulse oximeter and demonstrate  accurate use of the pulse oximeter.;To learn and demonstrate proper pursed lip breathing techniques or other breathing techniques.     Long  Term Goals Verbalizes importance of monitoring SPO2 with pulse oximeter and return demonstration;Maintenance of O2 saturations>88%;Exhibits proper breathing techniques, such as pursed lip breathing or other method taught during program session;Compliance with respiratory medication;Demonstrates proper use of MDI's    Goals/Expected Outcomes Compliance and understanding of oxygen saturation monitoring and breathing techniques to decrease shortness of breath             Initial Exercise Prescription:  Initial Exercise Prescription - 07/29/23 1400       Date of Initial Exercise RX and Referring Provider   Date 07/29/23    Referring Provider [name]    Expected Discharge Date 10/27/23      Treadmill   MPH 2    Grade 2    Minutes 15      NuStep   Level 2    SPM 60    Minutes 15      Prescription Details   Frequency (times per week) 2    Duration Progress to 30 minutes of continuous aerobic without signs/symptoms  of physical distress      Intensity   THRR 40-80% of Max Heartrate 55-110    Ratings of Perceived Exertion 11-13    Perceived Dyspnea 0-4      Progression   Progression Continue to progress workloads to maintain intensity without signs/symptoms of physical distress.      Resistance Training   Training Prescription Yes    Weight blue bands    Reps 10-15             Perform Capillary Blood Glucose checks as needed.  Exercise Prescription Changes:   Exercise Prescription Changes     Row Name 08/09/23 1500 08/23/23 1500 09/01/23 1400 09/06/23 1400 09/20/23 1500     Response to Exercise   Blood Pressure (Admit) 148/70 142/70 -- 130/66 148/70   Blood Pressure (Exercise) 154/72 -- -- 156/78 168/70   Blood Pressure (Exit) 132/58 136/70 -- 118/60 121/70   Heart Rate (Admit) 76 bpm 81 bpm -- 86 bpm 86 bpm   Heart Rate (Exercise) 104 bpm 110 bpm -- 125 bpm 117 bpm   Heart Rate (Exit) 89 bpm 97 bpm -- 112 bpm 108 bpm   Oxygen Saturation (Admit) 96 % 95 % -- 92 % 96 %   Oxygen Saturation (Exercise) 94 % 92 % -- 91 % 91 %   Oxygen Saturation (Exit) 94 % 94 % -- 95 % 95 %   Rating of Perceived Exertion (Exercise) 13 13 -- 13 13   Perceived Dyspnea (Exercise) 2 2 -- 3 3   Duration Continue with 30 min of aerobic exercise without signs/symptoms of physical distress. Continue with 30 min of aerobic exercise without signs/symptoms of physical distress. -- Continue with 30 min of aerobic exercise without signs/symptoms of physical distress. Continue with 30 min of aerobic exercise without signs/symptoms of physical distress.   Intensity THRR unchanged THRR unchanged -- THRR unchanged THRR New  55/125     Progression   Progression Continue to progress workloads to maintain intensity without signs/symptoms of physical distress. Continue to progress workloads to maintain intensity without signs/symptoms of physical distress. -- Continue to progress workloads to maintain intensity without  signs/symptoms of physical distress. Continue to progress workloads to maintain intensity without signs/symptoms of physical distress.     Resistance Training   Training Prescription Yes  Yes -- Yes Yes   Weight blue bands blue bands -- blue bands black bands   Reps 10-15 10-15 -- 10-15 10-15   Time 10 Minutes 10 Minutes -- 10 Minutes 10 Minutes     Treadmill   MPH 2 2 -- 2 2   Grade 2 2 -- 2 3.5   Minutes 15 15 -- 15 15   METs 3.08 3.08 -- 2.9 3.5     NuStep   Level 2 3 -- 5 4   Minutes 15 15 -- 15 15   METs 2.2 3 -- 3.6 2.9     Home Exercise Plan   Plans to continue exercise at -- -- Home (comment)  walking -- --   Frequency -- -- Add 1 additional day to program exercise sessions. -- --   Initial Home Exercises Provided -- -- 09/01/23 -- --            Exercise Comments:   Exercise Comments     Row Name 08/04/23 1518 09/01/23 1455         Exercise Comments Pt completed his first day of group exercise. He exercised on the treadmill for 15 min at 2.0 mph, incline 0. He then exercised on the recumbent stepper for 15 min, level 2, METs 2.6. He tolerated well. Performed warm up and cool down with verbal cues and demonstrative cues. Blue bands were a challenge for his shoulders. Discussed METs with good reception. Discussed with pt home exercise plan. He is not currently exercising but wants to begin walking with his wife. Discussed beginning with 1-2 nonrehab days, 15 min x2. He has significant back stiffness that he is seeing physical therapy and doing exercises for. He was agreeable to beginning to walk.               Exercise Goals and Review:   Exercise Goals     Row Name 07/29/23 1343             Exercise Goals   Increase Physical Activity Yes       Intervention Provide advice, education, support and counseling about physical activity/exercise needs.;Develop an individualized exercise prescription for aerobic and resistive training based on initial evaluation  findings, risk stratification, comorbidities and participant's personal goals.       Expected Outcomes Short Term: Attend rehab on a regular basis to increase amount of physical activity.;Long Term: Add in home exercise to make exercise part of routine and to increase amount of physical activity.;Long Term: Exercising regularly at least 3-5 days a week.       Increase Strength and Stamina Yes       Intervention Provide advice, education, support and counseling about physical activity/exercise needs.;Develop an individualized exercise prescription for aerobic and resistive training based on initial evaluation findings, risk stratification, comorbidities and participant's personal goals.       Expected Outcomes Short Term: Increase workloads from initial exercise prescription for resistance, speed, and METs.;Short Term: Perform resistance training exercises routinely during rehab and add in resistance training at home;Long Term: Improve cardiorespiratory fitness, muscular endurance and strength as measured by increased METs and functional capacity ( )       Able to understand and use rate of perceived exertion (RPE) scale Yes       Intervention Provide education and explanation on how to use RPE scale       Expected Outcomes Short Term: Able to use RPE daily in rehab to express subjective intensity level;Long Term:  Able  to use RPE to guide intensity level when exercising independently       Able to understand and use Dyspnea scale Yes       Intervention Provide education and explanation on how to use Dyspnea scale       Expected Outcomes Short Term: Able to use Dyspnea scale daily in rehab to express subjective sense of shortness of breath during exertion;Long Term: Able to use Dyspnea scale to guide intensity level when exercising independently       Knowledge and understanding of Target Heart Rate Range (THRR) Yes       Intervention Provide education and explanation of THRR including how the numbers  were predicted and where they are located for reference       Expected Outcomes Short Term: Able to state/look up THRR;Long Term: Able to use THRR to govern intensity when exercising independently;Short Term: Able to use daily as guideline for intensity in rehab       Understanding of Exercise Prescription Yes       Intervention Provide education, explanation, and written materials on patient's individual exercise prescription       Expected Outcomes Short Term: Able to explain program exercise prescription;Long Term: Able to explain home exercise prescription to exercise independently                Exercise Goals Re-Evaluation :  Exercise Goals Re-Evaluation     Row Name 08/01/23 1458 08/30/23 0928 09/22/23 0916         Exercise Goal Re-Evaluation   Exercise Goals Review Increase Physical Activity;Able to understand and use Dyspnea scale;Understanding of Exercise Prescription;Increase Strength and Stamina;Knowledge and understanding of Target Heart Rate Range (THRR);Able to understand and use rate of perceived exertion (RPE) scale Increase Physical Activity;Able to understand and use Dyspnea scale;Understanding of Exercise Prescription;Increase Strength and Stamina;Knowledge and understanding of Target Heart Rate Range (THRR);Able to understand and use rate of perceived exertion (RPE) scale Increase Physical Activity;Able to understand and use Dyspnea scale;Understanding of Exercise Prescription;Increase Strength and Stamina;Knowledge and understanding of Target Heart Rate Range (THRR);Able to understand and use rate of perceived exertion (RPE) scale     Comments Pt to begin group exercise on 08/04/23. Will progress as tolerated. Pt has completed 5 group exercise sessions. He has missed the last 2 sessions for vacation. He is exercising on the treadmill 15 min, 2 mph, 2 incline, METs 3.08. He then exercises on the recumbent stepper for 15 min, level 3, METs 3.0. He is progressing and  tolerating well. He performs warm up and cool down standing, including squats. Using blue bands. Will progress as tolerated. Pt has completed 11 group exercise sessions with good attendance besides a week vacation. He is exercising on the treadmill 15 min, 2 mph, 3.5 incline, METs 3.5. He then exercises on the recumbent stepper for 15 min, level 4, METs 3.0. He is tolerating well and has slightly progressed with METs on the treadmill but not the stepper. Will encouraged speed. He performs warm up and cool down standing, including squats. Using black bands now. Will progress as tolerated.     Expected Outcomes Through exercise at rehab and at home, the patient will decrease shortness of breath with daily activities and feel confident in carrying out an exercise regime at home. Through exercise at rehab and at home, the patient will decrease shortness of breath with daily activities and feel confident in carrying out an exercise regime at home. Through exercise at rehab and at home,  the patient will decrease shortness of breath with daily activities and feel confident in carrying out an exercise regime at home.              Discharge Exercise Prescription (Final Exercise Prescription Changes):  Exercise Prescription Changes - 09/20/23 1500       Response to Exercise   Blood Pressure (Admit) 148/70    Blood Pressure (Exercise) 168/70    Blood Pressure (Exit) 121/70    Heart Rate (Admit) 86 bpm    Heart Rate (Exercise) 117 bpm    Heart Rate (Exit) 108 bpm    Oxygen Saturation (Admit) 96 %    Oxygen Saturation (Exercise) 91 %    Oxygen Saturation (Exit) 95 %    Rating of Perceived Exertion (Exercise) 13    Perceived Dyspnea (Exercise) 3    Duration Continue with 30 min of aerobic exercise without signs/symptoms of physical distress.    Intensity THRR New   55/125     Progression   Progression Continue to progress workloads to maintain intensity without signs/symptoms of physical distress.       Resistance Training   Training Prescription Yes    Weight black bands    Reps 10-15    Time 10 Minutes      Treadmill   MPH 2    Grade 3.5    Minutes 15    METs 3.5      NuStep   Level 4    Minutes 15    METs 2.9             Nutrition:  Target Goals: Understanding of nutrition guidelines, daily intake of sodium 1500mg , cholesterol 200mg , calories 30% from fat and 7% or less from saturated fats, daily to have 5 or more servings of fruits and vegetables.  Biometrics:    Nutrition Therapy Plan and Nutrition Goals:  Nutrition Therapy & Goals - 08/30/23 1352       Nutrition Therapy   Diet Heart Healthy Diet    Drug/Food Interactions Statins/Certain Fruits      Personal Nutrition Goals   Nutrition Goal Patient to improve diet quality by using the plate method as a guide for meal planning to include lean protein/plant protein, fruits, vegetables, whole grains, nonfat dairy as part of a well-balanced diet.   goal in progress.   Personal Goal #2 Patient to identify strategies for weight loss of 0.5-2.0# per week.   goal not met.   Comments Goals in progress. Stancil has attended only 6 exercise sessions. Patient has medical history of COPD stage2, HTN, dyslipidemia. Patient's A1c is in a prediabetic range and liver enzymes are elevated. RUQ ultrasound from 04/26/23 showed fatty liver disease; he does report daily alcohol intake. He does report motivation to lose ~5-10# to aid with improving breathing; he is down 1.5# since starting with our program. He enjoys a wide variety of foods. Patient will benefit from participation in pulmonary rehab for nutrition, exercise, and lifestyle modification.      Intervention Plan   Intervention Prescribe, educate and counsel regarding individualized specific dietary modifications aiming towards targeted core components such as weight, hypertension, lipid management, diabetes, heart failure and other comorbidities.;Nutrition handout(s) given  to patient.    Expected Outcomes Short Term Goal: Understand basic principles of dietary content, such as calories, fat, sodium, cholesterol and nutrients.;Long Term Goal: Adherence to prescribed nutrition plan.             Nutrition Assessments:  Nutrition Assessments - 08/04/23  1506       Rate Your Plate Scores   Pre Score 55            MEDIFICTS Score Key: >=70 Need to make dietary changes  40-70 Heart Healthy Diet <= 40 Therapeutic Level Cholesterol Diet  Flowsheet Row PULMONARY REHAB CHRONIC OBSTRUCTIVE PULMONARY DISEASE from 08/04/2023 in [facility]  Picture Your Plate Total Score on Admission 55      Picture Your Plate Scores: <16 Unhealthy dietary pattern with much room for improvement. 41-50 Dietary pattern unlikely to meet recommendations for good health and room for improvement. 51-60 More healthful dietary pattern, with some room for improvement.  >60 Healthy dietary pattern, although there may be some specific behaviors that could be improved.    Nutrition Goals Re-Evaluation:  Nutrition Goals Re-Evaluation     Row Name 08/04/23 1401 08/30/23 1352           Goals   Current Weight 193 lb 9 oz (87.8 kg) 192 lb 0.3 oz (87.1 kg)      Comment A1c 5.8, AST 40, ALT 58, lipids WNL, LDL 91 (lipitor, fenofibrate) no new labs; most recent labs A1c 5.8, AST 40, ALT 58, lipids WNL, LDL 91 (lipitor, fenofibrate)      Expected Outcome Patient has medical history of COPD stage2, HTN, dyslipidemia. Patient's A1c is in a prediabetic range and liver enzymes are elevated. RUQ ultrasound from 04/26/23 showed fatty liver disease; he does report daily alcohol intake. He does report motivation to lose ~5-10# to aid with improving breathing. He enjoys a wide variety of foods. Patient will benefit from participation in pulmonary rehab for nutrition, exercise, and lifestyle modification. Goals in progress. Tysir has  attended only 6 exercise sessions. Patient has medical history of COPD stage2, HTN, dyslipidemia. Patient's A1c is in a prediabetic range and liver enzymes are elevated. RUQ ultrasound from 04/26/23 showed fatty liver disease; he does report daily alcohol intake. He does report motivation to lose ~5-10# to aid with improving breathing; he is down 1.5# since starting with our program. He enjoys a wide variety of foods. Patient will benefit from participation in pulmonary rehab for nutrition, exercise, and lifestyle modification.               Nutrition Goals Discharge (Final Nutrition Goals Re-Evaluation):  Nutrition Goals Re-Evaluation - 08/30/23 1352       Goals   Current Weight 192 lb 0.3 oz (87.1 kg)    Comment no new labs; most recent labs A1c 5.8, AST 40, ALT 58, lipids WNL, LDL 91 (lipitor, fenofibrate)    Expected Outcome Goals in progress. Whalen has attended only 6 exercise sessions. Patient has medical history of COPD stage2, HTN, dyslipidemia. Patient's A1c is in a prediabetic range and liver enzymes are elevated. RUQ ultrasound from 04/26/23 showed fatty liver disease; he does report daily alcohol intake. He does report motivation to lose ~5-10# to aid with improving breathing; he is down 1.5# since starting with our program. He enjoys a wide variety of foods. Patient will benefit from participation in pulmonary rehab for nutrition, exercise, and lifestyle modification.             Psychosocial: Target Goals: Acknowledge presence or absence of significant depression and/or stress, maximize coping skills, provide positive support system. Participant is able to verbalize types and ability to use techniques and skills needed for reducing stress and depression.  Initial Review & Psychosocial Screening:  Initial  Psych Review & Screening - 07/29/23 1333       Initial Review   Current issues with Current Sleep Concerns    Comments Pt has over the counter medication to help him fall  asleep. Pt denies any psychosocial needs at this time.      Family Dynamics   Good Support System? Yes    Comments wife, son and daughter-in-law      Barriers   Psychosocial barriers to participate in program There are no identifiable barriers or psychosocial needs.      Screening Interventions   Interventions Encouraged to exercise             Quality of Life Scores:  Scores of 19 and below usually indicate a poorer quality of life in these areas.  A difference of  2-3 points is a clinically meaningful difference.  A difference of 2-3 points in the total score of the Quality of Life Index has been associated with significant improvement in overall quality of life, self-image, physical symptoms, and general health in studies assessing change in quality of life.  PHQ-9: Review Flowsheet  More data exists      09/27/2023 07/29/2023 03/31/2023 10/27/2022 09/21/2022  Depression screen PHQ 2/9  Decreased Interest 0 1 0 0 0 0  Down, Depressed, Hopeless 1 1 0 0 0 0  PHQ - 2 Score 1 2 0 0 0 0  Altered sleeping - 0 0 0 0  Tired, decreased energy - 0 0 0 0  Change in appetite - 0 0 0 0  Feeling bad or failure about yourself  - 1 0 0 0  Trouble concentrating - 1 0 0 0  Moving slowly or fidgety/restless - 0 0 0 0  Suicidal thoughts - 0 0 0 0  PHQ-9 Score - 4 0 0 0  Difficult doing work/chores - Somewhat difficult Not difficult at all Not difficult at all Not difficult at all    Details       Multiple values from one day are sorted in reverse-chronological order        Interpretation of Total Score  Total Score Depression Severity:  1-4 = Minimal depression, 5-9 = Mild depression, 10-14 = Moderate depression, 15-19 = Moderately severe depression, 20-27 = Severe depression   Psychosocial Evaluation and Intervention:  Psychosocial Evaluation - 07/29/23 1336       Psychosocial Evaluation & Interventions   Interventions Encouraged to exercise with the program and follow  exercise prescription    Comments Pt denies any psychosocial needs at this time.    Expected Outcomes For Preslee to participate in PR free of psychosocial concerns.    Continue Psychosocial Services  No Follow up required             Psychosocial Re-Evaluation:  Psychosocial Re-Evaluation     Row Name 08/05/23 1121 09/02/23 1004 09/28/23 1046         Psychosocial Re-Evaluation   Current issues with Current Sleep Concerns Current Sleep Concerns Current Sleep Concerns     Comments Ammie still endorses early morning fatigue but states he is taking sleeping meds to keep him asleep throughout the night. Encouraged pt to speak to MD if he is concerned or his sleep habits worsen. Corie denies any other psychosocial barriers or concern. Denies any needs at this time. Psychosocial monthly re-evaluation is as follows: Aydrian states that his sleeping habits have improved. He stated more restful sleep waking up more energized than prior. He stated not  100% but better. Ademide denies any other psychosocial barriers or concern. He denies any needs at this time. Psychosocial monthly re-evaluation is as follows: Terique denies any psychosocial barriers or concerns. He states he is getting better sleep. He denies any needs currently.     Expected Outcomes For Leanord is get more restful sleep and continue to attend PR without any other psychosocial barriers or concerns. For Kyvin is get more restful sleep and continue to attend PR without any other psychosocial barriers or concerns. For Koben to continue attending PR without any psychosocial barriers or concerns.     Interventions Encouraged to attend Pulmonary Rehabilitation for the exercise Encouraged to attend Pulmonary Rehabilitation for the exercise Encouraged to attend Pulmonary Rehabilitation for the exercise     Continue Psychosocial Services  No Follow up required No Follow up required No Follow up required              Psychosocial Discharge  (Final Psychosocial Re-Evaluation):  Psychosocial Re-Evaluation - 09/28/23 1046       Psychosocial Re-Evaluation   Current issues with Current Sleep Concerns    Comments Psychosocial monthly re-evaluation is as follows: Jahcere denies any psychosocial barriers or concerns. He states he is getting better sleep. He denies any needs currently.    Expected Outcomes For Emil to continue attending PR without any psychosocial barriers or concerns.    Interventions Encouraged to attend Pulmonary Rehabilitation for the exercise    Continue Psychosocial Services  No Follow up required             Education: Education Goals: Education classes will be provided on a weekly basis, covering required topics. Participant will state understanding/return demonstration of topics presented.  Learning Barriers/Preferences:  Learning Barriers/Preferences - 07/29/23 1337       Learning Barriers/Preferences   Learning Barriers Sight    Learning Preferences Skilled Demonstration             Education Topics: Know Your Numbers Group instruction that is supported by a PowerPoint presentation. Instructor discusses importance of knowing and understanding resting, exercise, and post-exercise oxygen saturation, heart rate, and blood pressure. Oxygen saturation, heart rate, blood pressure, rating of perceived exertion, and dyspnea are reviewed along with a normal range for these values.    Exercise for the Pulmonary Patient Group instruction that is supported by a PowerPoint presentation. Instructor discusses benefits of exercise, core components of exercise, frequency, duration, and intensity of an exercise routine, importance of utilizing pulse oximetry during exercise, safety while exercising, and options of places to exercise outside of rehab.    MET Level  Group instruction provided by PowerPoint, verbal discussion, and written material to support subject matter. Instructor reviews what METs are and  how to increase METs.  Flowsheet Row PULMONARY REHAB CHRONIC OBSTRUCTIVE PULMONARY DISEASE from 09/01/2023 in [facility] for [facility], Vascular, & Lung Health  Date 09/01/23  Educator EP  Instruction Review Code 1- Verbalizes Understanding       Pulmonary Medications Verbally interactive group education provided by instructor with focus on inhaled medications and proper administration. Flowsheet Row PULMONARY REHAB CHRONIC OBSTRUCTIVE PULMONARY DISEASE from 09/29/2023 in [facility] for Heart, Vascular, & Lung Health  Date 09/29/23  Educator RT  Instruction Review Code 1- Verbalizes Understanding       Anatomy and Physiology of the Respiratory System Group instruction provided by PowerPoint, verbal discussion, and written material to support subject matter. Instructor reviews respiratory cycle and anatomical components  of the respiratory system and their functions. Instructor also reviews differences in obstructive and restrictive respiratory diseases with examples of each.  Flowsheet Row PULMONARY REHAB CHRONIC OBSTRUCTIVE PULMONARY DISEASE from 09/22/2023 in [facility] for Heart, Vascular, & Lung Health  Date 09/22/23  Educator RT  Instruction Review Code 1- Verbalizes Understanding       Oxygen Safety Group instruction provided by PowerPoint, verbal discussion, and written material to support subject matter. There is an overview of "What is Oxygen" and "Why do we need it".  Instructor also reviews how to create a safe environment for oxygen use, the importance of using oxygen as prescribed, and the risks of noncompliance. There is a brief discussion on traveling with oxygen and resources the patient may utilize.   Oxygen Use Group instruction provided by PowerPoint, verbal discussion, and written material to discuss how supplemental oxygen is prescribed and different types of oxygen supply systems. Resources  for more information are provided.    Breathing Techniques Group instruction that is supported by demonstration and informational handouts. Instructor discusses the benefits of pursed lip and diaphragmatic breathing and detailed demonstration on how to perform both.  Flowsheet Row PULMONARY REHAB CHRONIC OBSTRUCTIVE PULMONARY DISEASE from 08/04/2023 in [facility] for Heart, Vascular, & Lung Health  Date 08/04/23  Educator RN  Instruction Review Code 1- Verbalizes Understanding        Risk Factor Reduction Group instruction that is supported by a PowerPoint presentation. Instructor discusses the definition of a risk factor, different risk factors for pulmonary disease, and how the heart and lungs work together.   Pulmonary Diseases Group instruction provided by PowerPoint, verbal discussion, and written material to support subject matter. Instructor gives an overview of the different type of pulmonary diseases. There is also a discussion on risk factors and symptoms as well as ways to manage the diseases.   Stress and Energy Conservation Group instruction provided by PowerPoint, verbal discussion, and written material to support subject matter. Instructor gives an overview of stress and the impact it can have on the body. Instructor also reviews ways to reduce stress. There is also a discussion on energy conservation and ways to conserve energy throughout the day. Flowsheet Row PULMONARY REHAB CHRONIC OBSTRUCTIVE PULMONARY DISEASE from 08/11/2023 in [facility] for Heart, Vascular, & Lung Health  Date 08/11/23  Educator RN  Instruction Review Code 1- Verbalizes Understanding       Warning Signs and Symptoms Group instruction provided by PowerPoint, verbal discussion, and written material to support subject matter. Instructor reviews warning signs and symptoms of stroke, heart attack, cold and flu. Instructor also reviews ways to prevent  the spread of infection.   Other Education Group or individual verbal, written, or video instructions that support the educational goals of the pulmonary rehab program.    Knowledge Questionnaire Score:  Knowledge Questionnaire Score - 07/29/23 1436       Knowledge Questionnaire Score   Pre Score 14/18             Core Components/Risk Factors/Patient Goals at Admission:  Personal Goals and Risk Factors at Admission - 07/29/23 1338       Core Components/Risk Factors/Patient Goals on Admission    Weight Management Yes;Weight Loss    Intervention Weight Management: Develop a combined nutrition and exercise program designed to reach desired caloric intake, while maintaining appropriate intake of nutrient and fiber, sodium and fats, and appropriate energy expenditure required for the  weight goal.;Weight Management: Provide education and appropriate resources to help participant work on and attain dietary goals.;Weight Management/Obesity: Establish reasonable short term and long term weight goals.;Obesity: Provide education and appropriate resources to help participant work on and attain dietary goals.    Expected Outcomes Short Term: Continue to assess and modify interventions until short term weight is achieved;Long Term: Adherence to nutrition and physical activity/exercise program aimed toward attainment of established weight goal;Weight Maintenance: Understanding of the daily nutrition guidelines, which includes 25-35% calories from fat, 7% or less cal from saturated fats, less than 200mg  cholesterol, less than 1.5gm of sodium, & 5 or more servings of fruits and vegetables daily;Understanding recommendations for meals to include 15-35% energy as protein, 25-35% energy from fat, 35-60% energy from carbohydrates, less than 200mg  of dietary cholesterol, 20-35 gm of total fiber daily;Weight Loss: Understanding of general recommendations for a balanced deficit meal plan, which promotes 1-2 lb  weight loss per week and includes a negative energy balance of [phone] kcal/d;Understanding of distribution of calorie intake throughout the day with the consumption of 4-5 meals/snacks    Improve shortness of breath with ADL's Yes    Intervention Provide education, individualized exercise plan and daily activity instruction to help decrease symptoms of SOB with activities of daily living.    Expected Outcomes Short Term: Improve cardiorespiratory fitness to achieve a reduction of symptoms when performing ADLs;Long Term: Be able to perform more ADLs without symptoms or delay the onset of symptoms    Increase knowledge of respiratory medications and ability to use respiratory devices properly  Yes    Intervention Provide education and demonstration as needed of appropriate use of medications, inhalers, and oxygen therapy.    Expected Outcomes Short Term: Achieves understanding of medications use. Understands that oxygen is a medication prescribed by physician. Demonstrates appropriate use of inhaler and oxygen therapy.;Long Term: Maintain appropriate use of medications, inhalers, and oxygen therapy.             Core Components/Risk Factors/Patient Goals Review:   Goals and Risk Factor Review     Row Name 08/05/23 1126 09/02/23 1011 09/28/23 1049         Core Components/Risk Factors/Patient Goals Review   Personal Goals Review Weight Management/Obesity;Improve shortness of breath with ADL's;Develop more efficient breathing techniques such as purse lipped breathing and diaphragmatic breathing and practicing self-pacing with activity.;Increase knowledge of respiratory medications and ability to use respiratory devices properly. Weight Management/Obesity;Improve shortness of breath with ADL's;Develop more efficient breathing techniques such as purse lipped breathing and diaphragmatic breathing and practicing self-pacing with activity. Weight Management/Obesity;Improve shortness of breath with ADL's      Review Unable to assess goals. Kevyn has attended 1 PR session so far. He will continue to benefit from PR for nutrition, education, exercise, and lifestyle modification. Core components/risk factors/patient goals monthly re-evaluate are as follows: Goal progressing for weight loss. Ananth has lost ~1.5# from starting the program 3 weeks ago. He is working with our dietician on small changes he can make in his diet. Aaron' goal of decreasing his shortness of breath with ADLs and developing more efficient breathing techniques such as purse lipped breathing and diaphragmatic breathing; and practicing self-pacing with activity is in progress. He has maintained his O2 saturation >88% on room air with exertion. He has also been able to increase his workload and METs. Goal met on Increasing knowledge of respiratory medications and ability to use respiratory devices properly. Nicklas has brought his inhalers to class and has  demonstrated how to properly use them with our respiratory therapist. After providing feedback on how/when to use, Nuel now understands, and we review with him before class. Treyshon will continue to benefit from participation in PR for nutrition, education, exercise, and lifestyle modification. Core components/risk factors/patient goals monthly re-evaluate are as follows: Goal progressing for weight loss. Elijah Birk has lost ~6# from starting the program. He worked with our dietician on small changes he can make in his diet. He hopes to keep the weight off around the holidays. Goal progressing in decreasing his shortness of breath with ADLs. He has maintained his O2 saturation >88% on room air with exertion. He has also been able to increase his workload and METs. Goal met on developing more efficient breathing techniques such as purse lipped breathing and diaphragmatic breathing; and practicing self-pacing with activity is in progress. Elijah Birk will continue to benefit from participation in PR for  nutrition, education, exercise, and lifestyle modification.     Expected Outcomes For Lemarcus to improve shortness of breath with ADL's, increase knowledge of respiratory medications and ability to use respiratory devices properly, to lose weight and develop more efficient breathing techniques such as purse lipped breathing and diaphragmatic breathing; and practicing self-pacing with activity For Maisie Fus to improve shortness of breath with ADL's, to lose weight, and develop more efficient breathing techniques such as purse lipped breathing and diaphragmatic breathing; and practicing self-pacing with activity For Tom to improve his shortness of breath with ADL's and to lose weight              Core Components/Risk Factors/Patient Goals at Discharge (Final Review):   Goals and Risk Factor Review - 09/28/23 1049       Core Components/Risk Factors/Patient Goals Review   Personal Goals Review Weight Management/Obesity;Improve shortness of breath with ADL's    Review Core components/risk factors/patient goals monthly re-evaluate are as follows: Goal progressing for weight loss. Elijah Birk has lost ~6# from starting the program. He worked with our dietician on small changes he can make in his diet. He hopes to keep the weight off around the holidays. Goal progressing in decreasing his shortness of breath with ADLs. He has maintained his O2 saturation >88% on room air with exertion. He has also been able to increase his workload and METs. Goal met on developing more efficient breathing techniques such as purse lipped breathing and diaphragmatic breathing; and practicing self-pacing with activity is in progress. Elijah Birk will continue to benefit from participation in PR for nutrition, education, exercise, and lifestyle modification.    Expected Outcomes For Tom to improve his shortness of breath with ADL's and to lose weight             ITP Comments: Pt is making expected progress toward Pulmonary Rehab goals after  completing 14 session(s). Recommend continued exercise, life style modification, education, and utilization of breathing techniques to increase stamina and strength, while also decreasing shortness of breath with exertion.  Dr. Mechele Collin is Medical Director for Pulmonary Rehab at [facility].

## 2023-10-06 ENCOUNTER — Encounter (HOSPITAL_COMMUNITY): Payer: Medicare Other

## 2023-10-06 ENCOUNTER — Encounter (HOSPITAL_COMMUNITY)
Admission: RE | Admit: 2023-10-06 | Discharge: 2023-10-06 | Disposition: A | Discharge status: Home / Self Care | Payer: Medicare Other | Source: Ambulatory Visit | Attending: Internal Medicine | Admitting: Internal Medicine

## 2023-10-06 DIAGNOSIS — J449 Chronic obstructive pulmonary disease, unspecified: Secondary | ICD-10-CM | POA: Diagnosis not present

## 2023-10-06 NOTE — Progress Notes (Signed)
Daily Session Note  Patient Details  Name: Austin [name] MRN: [id number] Date of Birth: Nov 11, 1940 Referring Provider:   Doristine Devoid Pulmonary Rehab Walk Test from 07/29/2023 in [facility]  Referring Provider [name]       Encounter Date: 10/06/2023  Check In:  Session Check In - 10/06/23 1325       Check-In   Supervising physician immediately available to respond to emergencies [facility] MD immediately available    Physician(s) Reather Littler, NP    Location [facility]    Staff Present Essie Hart, RN, BSN;Randi Reeve BS, ACSM-CEP, Exercise Physiologist;Kemaya Dorner Katrinka Blazing, RT    Virtual Visit No    Medication changes reported     No    Fall or balance concerns reported    No    Tobacco Cessation No Change    Warm-up and Cool-down Performed as group-led instruction    Resistance Training Performed Yes    VAD Patient? No    PAD/SET Patient? No      Pain Assessment   Currently in Pain? No/denies    Multiple Pain Sites No             Capillary Blood Glucose: No results found for this or any previous visit (from the past 24 hours).    Social History   Tobacco Use  Smoking Status Former   Types: Cigars   Quit date: 10/22/2019   Years since quitting: 3.9  Smokeless Tobacco Never  Tobacco Comments   smoked occ. with son. did not tolerate it long. 12/14/19.    Goals Met:  Proper associated with RPD/PD & O2 Sat Independence with exercise equipment Exercise tolerated well No report of concerns or symptoms today Strength training completed today  Goals Unmet:  Not Applicable  Comments: Service time is from 1309 to 1444.    Dr. Mechele Collin is Medical Director for Pulmonary Rehab at [facility].

## 2023-10-10 ENCOUNTER — Other Ambulatory Visit: Payer: Self-pay | Admitting: Internal Medicine

## 2023-10-10 DIAGNOSIS — K219 Gastro-esophageal reflux disease without esophagitis: Secondary | ICD-10-CM

## 2023-10-10 NOTE — Telephone Encounter (Signed)
K21.9

## 2023-10-11 ENCOUNTER — Other Ambulatory Visit: Payer: Self-pay | Admitting: Family Medicine

## 2023-10-11 ENCOUNTER — Encounter (HOSPITAL_COMMUNITY): Payer: Medicare Other

## 2023-10-11 ENCOUNTER — Encounter (HOSPITAL_COMMUNITY)
Admission: RE | Admit: 2023-10-11 | Discharge: 2023-10-11 | Disposition: A | Discharge status: Home / Self Care | Payer: Medicare Other | Source: Ambulatory Visit | Attending: Internal Medicine | Admitting: Internal Medicine

## 2023-10-11 DIAGNOSIS — J449 Chronic obstructive pulmonary disease, unspecified: Secondary | ICD-10-CM

## 2023-10-11 NOTE — Progress Notes (Signed)
 Daily Session Note  Patient Details  Name: Nicklaus Alviar MRN: [id number] Date of Birth: Jul 22, 1941 Referring Provider:   Conrad Ports Pulmonary Rehab Walk Test from 07/29/2023 in [facility]  Referring Provider Meade       Encounter Date: 10/11/2023  Check In:  Session Check In - 10/11/23 1318       Check-In   Supervising physician immediately available to respond to emergencies [facility] immediately available    Physician(s) Orren Fabry, PA    [facility]    Staff Present Ronal Levin, RN, BSN;Randi Midge BS, ACSM-CEP, Exercise Physiologist;Casey Claudene, RT    Virtual Visit No    Medication changes reported     No    Fall or balance concerns reported    No    Tobacco Cessation No Change    Warm-up and Cool-down Performed as group-led instruction    Resistance Training Performed Yes    VAD Patient? No    PAD/SET Patient? No      Pain Assessment   Currently in Pain? No/denies    Multiple Pain Sites No             Capillary Blood Glucose: No results found for this or any previous visit (from the past 24 hours).    Social History   Tobacco Use  Smoking Status Former   Types: Cigars   Quit date: 10/22/2019   Years since quitting: 3.9  Smokeless Tobacco Never  Tobacco Comments   smoked occ. with son. did not tolerate it long. 12/14/19.    Goals Met:  Exercise tolerated well No report of concerns or symptoms today Strength training completed today  Goals Unmet:  Not Applicable  Comments: Service time is from 1306 to 1436    Dr. Slater Staff is Medical Director for Pulmonary Rehab at [facility].

## 2023-10-13 ENCOUNTER — Encounter (HOSPITAL_COMMUNITY): Payer: Medicare Other

## 2023-10-13 ENCOUNTER — Encounter (HOSPITAL_COMMUNITY)
Admission: RE | Admit: 2023-10-13 | Discharge: 2023-10-13 | Disposition: A | Discharge status: Home / Self Care | Payer: Medicare Other | Source: Ambulatory Visit | Attending: Internal Medicine | Admitting: Internal Medicine

## 2023-10-13 DIAGNOSIS — J449 Chronic obstructive pulmonary disease, unspecified: Secondary | ICD-10-CM

## 2023-10-13 NOTE — Progress Notes (Signed)
 Daily Session Note  Patient Details  Name: Austin [name] MRN: [id number] Date of Birth: 25-Mar-1941 Referring Provider:   Conrad Ports Pulmonary Rehab Walk Test from 07/29/2023 in [facility], [facility]  Referring Provider Meade       Encounter Date: 10/13/2023  Check In:  Session Check In - 10/13/23 1333       Check-In   Supervising physician immediately available to respond to emergencies [facility] immediately available    Physician(s) Rosaline Bane, NP    [facility]    Staff Present Ronal Levin, RN, BSN;Arick Mareno Midge BS, ACSM-CEP, Exercise Physiologist;Casey Claudene, RT    Virtual Visit No    Medication changes reported     No    Fall or balance concerns reported    No    Tobacco Cessation No Change    Warm-up and Cool-down Performed as group-led instruction    Resistance Training Performed Yes    VAD Patient? No    PAD/SET Patient? No      Pain Assessment   Currently in Pain? No/denies    Multiple Pain Sites No             Capillary Blood Glucose: No results found for this or any previous visit (from the past 24 hours).    Social History   Tobacco Use  Smoking Status Former   Types: Cigars   Quit date: 10/22/2019   Years since quitting: 3.9  Smokeless Tobacco Never  Tobacco Comments   smoked occ. with son. did not tolerate it long. 12/14/19.    Goals Met:  Independence with exercise equipment Exercise tolerated well No report of concerns or symptoms today Strength training completed today  Goals Unmet:  Not Applicable  Comments: Service time is from 1307 to 1436.    Dr. Slater Staff is Medical Director for Pulmonary Rehab at [facility].

## 2023-10-18 ENCOUNTER — Encounter (HOSPITAL_COMMUNITY)
Admission: RE | Admit: 2023-10-18 | Discharge: 2023-10-18 | Disposition: A | Discharge status: Home / Self Care | Payer: Medicare Other | Source: Ambulatory Visit | Attending: Internal Medicine | Admitting: Internal Medicine

## 2023-10-18 ENCOUNTER — Encounter (HOSPITAL_COMMUNITY): Payer: Medicare Other

## 2023-10-18 VITALS — Wt 186.9 lb

## 2023-10-18 DIAGNOSIS — J449 Chronic obstructive pulmonary disease, unspecified: Secondary | ICD-10-CM | POA: Diagnosis not present

## 2023-10-18 NOTE — Progress Notes (Signed)
 Daily Session Note  Patient Details  Name: Austin [name] MRN: [id number] Date of Birth: 05/12/1941 Referring Provider:   Conrad Ports Pulmonary Rehab Walk Test from 07/29/2023 in [facility] for [facility], V[facility], [facility]  Referring Provider Meade       Encounter Date: 10/18/2023  Check In:  Session Check In - 10/18/23 1316       Check-In   Supervising physician immediately available to respond to emergencies [facility] immediately available    Physician(s) Rosaline Bane, NP    [facility]    Staff Present Ronal Levin, RN, BSN;Randi Midge BS, ACSM-CEP, Exercise Physiologist;Pearly Bartosik Claudene Neita Moats, MS, ACSM-CEP, Exercise Physiologist    Virtual Visit No    Medication changes reported     No    Fall or balance concerns reported    No    Tobacco Cessation No Change    Warm-up and Cool-down Performed as group-led instruction    Resistance Training Performed Yes    VAD Patient? No    PAD/SET Patient? No      Pain Assessment   Currently in Pain? No/denies    Multiple Pain Sites No             Capillary Blood Glucose: No results found for this or any previous visit (from the past 24 hours).   Exercise Prescription Changes - 10/18/23 1400       Response to Exercise   Blood Pressure (Admit) 128/66    Blood Pressure (Exercise) 132/64    Blood Pressure (Exit) 96/56    Heart Rate (Admit) 81 bpm    Heart Rate (Exercise) 123 bpm    Heart Rate (Exit) 96 bpm    Oxygen Saturation (Admit) 96 %    Oxygen Saturation (Exercise) 92 %    Oxygen Saturation (Exit) 94 %    Rating of Perceived Exertion (Exercise) 13    Perceived Dyspnea (Exercise) 3    Duration Continue with 30 min of aerobic exercise without signs/symptoms of physical distress.    Intensity THRR unchanged      Progression   Progression Continue to progress workloads to maintain intensity without signs/symptoms of physical distress.      Resistance  Training   Training Prescription Yes    Weight black bands    Reps 10-15    Time 10 Minutes      Treadmill   MPH 2    Grade 3.5    Minutes 15    METs 3.2      NuStep   Level 5    SPM 92    Minutes 15    METs 3.2             Social History   Tobacco Use  Smoking Status Former   Types: Cigars   Quit date: 10/22/2019   Years since quitting: 3.9  Smokeless Tobacco Never  Tobacco Comments   smoked occ. with son. did not tolerate it long. 12/14/19.    Goals Met:  Proper associated with RPD/PD & O2 Sat Independence with exercise equipment Exercise tolerated well No report of concerns or symptoms today Strength training completed today  Goals Unmet:  Not Applicable  Comments: Service time is from 1310 to 1436.    Dr. Slater Staff is Medical Director for Pulmonary Rehab at [facility].

## 2023-10-19 ENCOUNTER — Other Ambulatory Visit: Payer: Self-pay | Admitting: Internal Medicine

## 2023-10-19 ENCOUNTER — Other Ambulatory Visit: Payer: Self-pay | Admitting: Family Medicine

## 2023-10-20 ENCOUNTER — Encounter (HOSPITAL_COMMUNITY): Payer: Medicare Other

## 2023-10-20 ENCOUNTER — Encounter (HOSPITAL_COMMUNITY)
Admission: RE | Admit: 2023-10-20 | Discharge: 2023-10-20 | Disposition: A | Discharge status: Home / Self Care | Payer: Medicare Other | Source: Ambulatory Visit | Attending: Internal Medicine | Admitting: Internal Medicine

## 2023-10-20 DIAGNOSIS — J449 Chronic obstructive pulmonary disease, unspecified: Secondary | ICD-10-CM

## 2023-10-20 NOTE — Progress Notes (Signed)
 Daily Session Note  Patient Details  Name: Austin [name] MRN: [id number] Date of Birth: May 09, 1941 Referring Provider:   Conrad Ports Pulmonary Rehab Walk Test from 07/29/2023 in [facility] for [facility], V[facility], & [facility]  Referring Provider Meade       Encounter Date: 10/20/2023  Check In:  Session Check In - 10/20/23 1314       Check-In   Supervising physician immediately available to respond to emergencies [facility] immediately available    Physician(s) Barnie Press, NP    [facility]    Staff Present Ronal Levin, RN, BSN;Randi Midge HECKLE, ACSM-CEP, Exercise Physiologist;Casey Claudene Neita Moats, MS, ACSM-CEP, Exercise Physiologist    Virtual Visit No    Medication changes reported     No    Fall or balance concerns reported    No    Tobacco Cessation No Change    Warm-up and Cool-down Performed as group-led instruction    Resistance Training Performed Yes    VAD Patient? No    PAD/SET Patient? No      Pain Assessment   Currently in Pain? No/denies    Multiple Pain Sites No             Capillary Blood Glucose: No results found for this or any previous visit (from the past 24 hours).    Social History   Tobacco Use  Smoking Status Former   Types: Cigars   Quit date: 10/22/2019   Years since quitting: 3.9  Smokeless Tobacco Never  Tobacco Comments   smoked occ. with son. did not tolerate it long. 12/14/19.    Goals Met:  Exercise tolerated well No report of concerns or symptoms today Strength training completed today  Goals Unmet:  Not Applicable  Comments: Service time is from 1305 to 1416    Dr. Slater Staff is Medical Director for Pulmonary Rehab at [facility].

## 2023-10-21 ENCOUNTER — Other Ambulatory Visit: Payer: Self-pay | Admitting: Family Medicine

## 2023-10-25 ENCOUNTER — Encounter (HOSPITAL_COMMUNITY)
Admission: RE | Admit: 2023-10-25 | Discharge: 2023-10-25 | Disposition: A | Discharge status: Home / Self Care | Payer: Medicare Other | Source: Ambulatory Visit | Attending: Internal Medicine

## 2023-10-25 ENCOUNTER — Encounter (HOSPITAL_COMMUNITY): Payer: Medicare Other

## 2023-10-25 DIAGNOSIS — J449 Chronic obstructive pulmonary disease, unspecified: Secondary | ICD-10-CM

## 2023-10-25 NOTE — Progress Notes (Signed)
 Daily Session Note  Patient Details  Name: Austin [name] MRN: [id number] Date of Birth: 11-11-1940 Referring Provider:   Conrad Ports Pulmonary Rehab Walk Test from 07/29/2023 in [facility] for Heart, Vascular, & Lung Health  Referring Provider Meade       Encounter Date: 10/25/2023  Check In:  Session Check In - 10/25/23 1324       Check-In   Supervising physician immediately available to respond to emergencies [facility] immediately available    Physician(s) Lamarr Satterfield, NP    [facility]    Staff Present Ronal Levin, RN, BSN;Randi Midge HECKLE, ACSM-CEP, Exercise Physiologist;Casey Claudene Neita Moats, MS, ACSM-CEP, Exercise Physiologist    Virtual Visit No    Medication changes reported     No    Fall or balance concerns reported    No    Tobacco Cessation No Change    Warm-up and Cool-down Performed as group-led instruction    Resistance Training Performed Yes    VAD Patient? No    PAD/SET Patient? No      Pain Assessment   Currently in Pain? No/denies    Multiple Pain Sites No             Capillary Blood Glucose: No results found for this or any previous visit (from the past 24 hours).    Social History   Tobacco Use  Smoking Status Former   Types: Cigars   Quit date: 10/22/2019   Years since quitting: 4.0  Smokeless Tobacco Never  Tobacco Comments   smoked occ. with son. did not tolerate it long. 12/14/19.    Goals Met:  Proper associated with RPD/PD & O2 Sat Exercise tolerated well No report of concerns or symptoms today Strength training completed today  Goals Unmet:  Not Applicable  Comments: Service time is from 1300 to 1435.    Dr. Slater Staff is Medical Director for Pulmonary Rehab at [facility].

## 2023-10-26 ENCOUNTER — Telehealth (HOSPITAL_COMMUNITY): Payer: Self-pay

## 2023-10-26 NOTE — Telephone Encounter (Signed)
 LVM for patient regarding Pulm Rehab questionnaire, awaiting call back

## 2023-10-27 ENCOUNTER — Encounter (HOSPITAL_COMMUNITY): Payer: Medicare Other

## 2023-10-27 ENCOUNTER — Encounter (HOSPITAL_COMMUNITY)
Admission: RE | Admit: 2023-10-27 | Discharge: 2023-10-27 | Disposition: A | Discharge status: Home / Self Care | Payer: Medicare Other | Source: Ambulatory Visit | Attending: Internal Medicine | Admitting: Internal Medicine

## 2023-10-27 DIAGNOSIS — J449 Chronic obstructive pulmonary disease, unspecified: Secondary | ICD-10-CM | POA: Diagnosis not present

## 2023-10-27 NOTE — Progress Notes (Signed)
Daily Session Note  Patient Details  Name: Austin [name] MRN: [id number] Date of Birth: [birth year] Referring Provider:   Doristine Devoid Pulmonary Rehab Walk Test from 07/29/2023 in [facility], V[facility], & [facility]  Referring Provider [name]       Encounter Date: 10/27/2023  Check In:  Session Check In - 10/27/23 1328       Check-In   Supervising physician immediately available to respond to emergencies [facility] immediately available    Physician(s) Joni Reining, NP    [facility]    Staff Present Essie Hart, RN, BSN;Randi Dionisio Paschal, ACSM-CEP, Exercise Physiologist;Casey Charlean Sanfilippo, MS, ACSM-CEP, Exercise Physiologist    Virtual Visit No    Medication changes reported     No    Fall or balance concerns reported    No    Tobacco Cessation No Change    Warm-up and Cool-down Performed as group-led instruction    Resistance Training Performed Yes    VAD Patient? No    PAD/SET Patient? No      Pain Assessment   Currently in Pain? No/denies    Multiple Pain Sites No             Capillary Blood Glucose: No results found for this or any previous visit (from the past 24 hours).    Social History   Tobacco Use  Smoking Status Former   Types: Cigars   Quit date: 10/22/2019   Years since quitting: 4.0  Smokeless Tobacco Never  Tobacco Comments   smoked occ. with son. did not tolerate it long. 12/14/19.    Goals Met:  Independence with exercise equipment  Goals Unmet:  Not Applicable  Comments: Service time is from 1315 to 1341    Dr. Mechele Collin is Medical Director for Pulmonary Rehab at [facility]

## 2023-11-01 ENCOUNTER — Encounter (HOSPITAL_COMMUNITY)
Admission: RE | Admit: 2023-11-01 | Discharge: 2023-11-01 | Disposition: A | Discharge status: Home / Self Care | Payer: Medicare Other | Source: Ambulatory Visit | Attending: Internal Medicine | Admitting: Internal Medicine

## 2023-11-01 DIAGNOSIS — J449 Chronic obstructive pulmonary disease, unspecified: Secondary | ICD-10-CM

## 2023-11-02 ENCOUNTER — Other Ambulatory Visit: Payer: Self-pay | Admitting: Family Medicine

## 2023-11-02 ENCOUNTER — Other Ambulatory Visit: Payer: Self-pay | Admitting: Internal Medicine

## 2023-11-02 DIAGNOSIS — J301 Allergic rhinitis due to pollen: Secondary | ICD-10-CM

## 2023-11-02 NOTE — Progress Notes (Signed)
Pulmonary Individual Treatment Plan  Patient Details  Name: Austin [name] MRN: [id number] Date of Birth: June 07, 1941 Referring Provider:   Doristine Devoid Pulmonary Rehab Walk Test from 07/29/2023 in [facility]  Referring Provider [name]       Initial Encounter Date:  Flowsheet Row Pulmonary Rehab Walk Test from 07/29/2023 in [facility]  Date 07/29/23       Visit Diagnosis: Stage 2 moderate COPD by GOLD classification (HCC)  Patient's Home Medications on Admission:   Current Outpatient Medications:    albuterol (PROVENTIL) (2.5 MG/3ML) 0.083% nebulizer solution, USE 1 VIAL BY NEBULIZATION EVERY 4 HOURS AS NEEDED FOR WHEEZING OR SHORTNESS OF BREATH/DYSPNEA. J45.998, Disp: 375 mL, Rfl: 1   albuterol (VENTOLIN HFA) 108 (90 Base) MCG/ACT inhaler, INHALE 1-2 PUFFS BY MOUTH EVERY 6 HOURS AS NEEDED FOR WHEEZE OR SHORTNESS OF BREATH, Disp: 18 each, Rfl: 5   amLODipine (NORVASC) 5 MG tablet, TAKE 1 TABLET (5 MG TOTAL) BY MOUTH DAILY., Disp: 90 tablet, Rfl: 3   atorvastatin (LIPITOR) 80 MG tablet, TAKE 1 TABLET BY MOUTH EVERY DAY, Disp: 90 tablet, Rfl: 1   baclofen (LIORESAL) 10 MG tablet, Take 0.5-1 tablets (5-10 mg total) by mouth 4 (four) times daily as needed for muscle spasms., Disp: 30 each, Rfl: 0   BREZTRI AEROSPHERE 160-9-4.8 MCG/ACT AERO, INHALE 2 PUFFS INTO THE LUNGS IN THE MORNING AND AT BEDTIME., Disp: 10.7 each, Rfl: 1   Cyanocobalamin (VITAMIN B 12 PO), Take 1,000 Units by mouth., Disp: , Rfl:    fenofibrate micronized (LOFIBRA) 200 MG capsule, TAKE 1 CAPSULE BY MOUTH EVERY DAY, Disp: 90 capsule, Rfl: 0   gabapentin (NEURONTIN) 100 MG capsule, TAKE 1 CAPSULE BY MOUTH EVERYDAY AT BEDTIME, Disp: 30 capsule, Rfl: 0   glucose blood (FREESTYLE LITE) test strip, CHECK BLOOD SUGAR THREE TIMES A DAY E11.9, Disp: 100 strip, Rfl: 12   ibuprofen (ADVIL) 800 MG tablet, TAKE 1  TABLET BY MOUTH EVERY 8 HOURS AS NEEDED, Disp: 30 tablet, Rfl: 0   Lancets Misc. (UNISTIK 2 EXTRA) MISC, Use to check blood sugar 3 times daily/ Dx E11.9, Disp: 200 each, Rfl: 0   loratadine (CLARITIN) 10 MG tablet, TAKE 1 TABLET BY MOUTH EVERY DAY, Disp: 90 tablet, Rfl: 3   losartan (COZAAR) 100 MG tablet, TAKE 1 TABLET BY MOUTH EVERY DAY, Disp: 90 tablet, Rfl: 3   magnesium oxide (MAG-OX) 400 (240 Mg) MG tablet, TAKE 1 TABLET BY MOUTH EVERY DAY, Disp: 90 tablet, Rfl: 3   montelukast (SINGULAIR) 10 MG tablet, TAKE 1 TABLET BY MOUTH EVERYDAY AT BEDTIME, Disp: 90 tablet, Rfl: 1   nitrofurantoin, macrocrystal-monohydrate, (MACROBID) 100 MG capsule, Take 100 mg by mouth daily., Disp: , Rfl:    omeprazole (PRILOSEC) 20 MG capsule, TAKE 2 CAPSULES BY MOUTH EVERY DAY, Disp: 180 capsule, Rfl: 1   ondansetron (ZOFRAN) 4 MG tablet, Take 1 tablet (4 mg total) by mouth every 8 (eight) hours as needed for nausea or vomiting., Disp: 20 tablet, Rfl: 0   predniSONE (DELTASONE) 10 MG tablet, 4 tabs for 2 days, then 3 tabs for 2 days, 2 tabs for 2 days, then 1 tab for 2 days, then stop, Disp: 20 tablet, Rfl: 0   solifenacin (VESICARE) 5 MG tablet, Take 5 mg by mouth daily., Disp: , Rfl:    Spacer/Aero-Holding Chambers DEVI, 1 each by Does not apply route as directed., Disp: 1  each, Rfl: 1   Vitamin D, Cholecalciferol, 50 MCG (2000 UT) CAPS, Take by mouth., Disp: , Rfl:   Past Medical History: Past Medical History:  Diagnosis Date   Asthma    Depression    Hypertension    Prostate cancer (HCC)    Urinary incontinence     Tobacco Use: Social History   Tobacco Use  Smoking Status Former   Types: Cigars   Quit date: 10/22/2019   Years since quitting: 4.0  Smokeless Tobacco Never  Tobacco Comments   smoked occ. with son. did not tolerate it long. 12/14/19.    Labs: Review Flowsheet  More data exists      Latest Ref Rng & Units 02/10/2021 11/23/2021 04/23/2022 10/27/2022 03/31/2023  Labs for ITP Cardiac  and Pulmonary Rehab  Cholestrol 0 - 200 mg/dL 098  119  147  829  562   LDL (calc) 0 - 99 mg/dL 90  90  130  85  91   HDL-C >39.00 mg/dL 86.57  84.69  62.95  28.41  49.80   Trlycerides 0.0 - 149.0 mg/dL 32.4  40.1  02.7  253.6  83.0   Hemoglobin A1c 4.0 - 5.6 % 5.7  6.2  6.1  6.4  5.8     Capillary Blood Glucose: No results found for: "GLUCAP"   Pulmonary Assessment Scores:  Pulmonary Assessment Scores     Row Name 07/29/23 1341 10/25/23 1545 10/27/23 1456     ADL UCSD   ADL Phase Entry Exit Exit   SOB Score total 53 41 --     CAT Score   CAT Score 15 -- 7     mMRC Score   mMRC Score 3 3 --           UCSD: Self-administered rating of dyspnea associated with activities of daily living (ADLs) 6-point scale (0 = "not at all" to 5 = "maximal or unable to do because of breathlessness")  Scoring Scores range from 0 to 120.  Minimally important difference is 5 units  CAT: CAT can identify the health impairment of COPD patients and is better correlated with disease progression.  CAT has a scoring range of zero to 40. The CAT score is classified into four groups of low (less than 10), medium (10 - 20), high (21-30) and very high (31-40) based on the impact level of disease on health status. A CAT score over 10 suggests significant symptoms.  A worsening CAT score could be explained by an exacerbation, poor medication adherence, poor inhaler technique, or progression of COPD or comorbid conditions.  CAT MCID is 2 points  mMRC: mMRC (Modified Medical Research Council) Dyspnea Scale is used to assess the degree of baseline functional disability in patients of respiratory disease due to dyspnea. No minimal important difference is established. A decrease in score of 1 point or greater is considered a positive change.   Pulmonary Function Assessment:  Pulmonary Function Assessment - 07/29/23 1341       Breath   Shortness of Breath Yes;Fear of Shortness of Breath;Limiting activity              Exercise Target Goals: Exercise Program Goal: Individual exercise prescription set using results from initial 6 min walk test and THRR while considering  patient's activity barriers and safety.   Exercise Prescription Goal: Initial exercise prescription builds to 30-45 minutes a day of aerobic activity, 2-3 days per week.  Home exercise guidelines will be given to patient during program as  part of exercise prescription that the participant will acknowledge.  Activity Barriers & Risk Stratification:  Activity Barriers & Cardiac Risk Stratification - 07/29/23 1343       Activity Barriers & Cardiac Risk Stratification   Activity Barriers Left Knee Replacement;Right Knee Replacement;Shortness of Breath;Muscular Weakness;Deconditioning;Back Problems;Balance Concerns;Arthritis             6 Minute Walk:  6 Minute Walk     Row Name 07/29/23 1427 10/25/23 1537       6 Minute Walk   Phase Initial Discharge    Distance 1170 feet 1338 feet    Distance % Change -- 14.36 %    Distance Feet Change -- 168 ft    Walk Time 6 minutes 6 minutes    # of Rest Breaks 0 0    MPH 2.22 2.53    METS 2.9 3.26    RPE 15 15    Perceived Dyspnea  2 3    VO2 Peak 10.14 11.42    Symptoms No Yes (comment)    Comments -- Pt feeling tired today. Thinks he might be getting sick. HR higher than normal for him.    Resting HR 81 bpm 104 bpm    Resting BP 138/68 134/64    Resting Oxygen Saturation  96 % 95 %    Exercise Oxygen Saturation  during 6 min walk 90 % 89 %    Max Ex. HR 128 bpm 155 bpm    Max Ex. BP 198/80 172/66    2 Minute Post BP 140/74 140/66      Interval HR   1 Minute HR 104 122    2 Minute HR 113 138    3 Minute HR 115 148    4 Minute HR 121 155    5 Minute HR 124 150    6 Minute HR 128 140    2 Minute Post HR 98 126    Interval Heart Rate? Yes --      Interval Oxygen   Interval Oxygen? Yes Yes    Baseline Oxygen Saturation % 96 % 95 %    1 Minute Oxygen  Saturation % 96 % 95 %    1 Minute Liters of Oxygen 0 L 0 L    2 Minute Oxygen Saturation % 91 % 90 %    2 Minute Liters of Oxygen 0 L 0 L    3 Minute Oxygen Saturation % 91 % 90 %    3 Minute Liters of Oxygen 0 L 0 L    4 Minute Oxygen Saturation % 91 % 89 %    4 Minute Liters of Oxygen 0 L 0 L    5 Minute Oxygen Saturation % 90 % 89 %    5 Minute Liters of Oxygen 0 L 0 L    6 Minute Oxygen Saturation % 90 % 89 %    6 Minute Liters of Oxygen 0 L 0 L    2 Minute Post Oxygen Saturation % 95 % 96 %    2 Minute Post Liters of Oxygen 0 L 0 L             Oxygen Initial Assessment:  Oxygen Initial Assessment - 07/29/23 1340       Home Oxygen   Home Oxygen Device None    Sleep Oxygen Prescription None    Home Exercise Oxygen Prescription None    Home Resting Oxygen Prescription None      Initial 6  min Walk   Oxygen Used None      Program Oxygen Prescription   Program Oxygen Prescription None      Intervention   Short Term Goals To learn and understand importance of maintaining oxygen saturations>88%;To learn and demonstrate proper use of respiratory medications;To learn and understand importance of monitoring SPO2 with pulse oximeter and demonstrate accurate use of the pulse oximeter.;To learn and demonstrate proper pursed lip breathing techniques or other breathing techniques.     Long  Term Goals Verbalizes importance of monitoring SPO2 with pulse oximeter and return demonstration;Maintenance of O2 saturations>88%;Exhibits proper breathing techniques, such as pursed lip breathing or other method taught during program session;Compliance with respiratory medication;Demonstrates proper use of MDI's             Oxygen Re-Evaluation:  Oxygen Re-Evaluation     Row Name 08/01/23 1506 08/30/23 0935 09/22/23 0919         Program Oxygen Prescription   Program Oxygen Prescription None None None       Home Oxygen   Home Oxygen Device None None None     Sleep Oxygen  Prescription None None None     Home Exercise Oxygen Prescription None None None     Home Resting Oxygen Prescription None None None       Goals/Expected Outcomes   Short Term Goals To learn and understand importance of maintaining oxygen saturations>88%;To learn and demonstrate proper use of respiratory medications;To learn and understand importance of monitoring SPO2 with pulse oximeter and demonstrate accurate use of the pulse oximeter.;To learn and demonstrate proper pursed lip breathing techniques or other breathing techniques.  To learn and understand importance of maintaining oxygen saturations>88%;To learn and demonstrate proper use of respiratory medications;To learn and understand importance of monitoring SPO2 with pulse oximeter and demonstrate accurate use of the pulse oximeter.;To learn and demonstrate proper pursed lip breathing techniques or other breathing techniques.  To learn and understand importance of maintaining oxygen saturations>88%;To learn and demonstrate proper use of respiratory medications;To learn and understand importance of monitoring SPO2 with pulse oximeter and demonstrate accurate use of the pulse oximeter.;To learn and demonstrate proper pursed lip breathing techniques or other breathing techniques.      Long  Term Goals Verbalizes importance of monitoring SPO2 with pulse oximeter and return demonstration;Maintenance of O2 saturations>88%;Exhibits proper breathing techniques, such as pursed lip breathing or other method taught during program session;Compliance with respiratory medication;Demonstrates proper use of MDI's Verbalizes importance of monitoring SPO2 with pulse oximeter and return demonstration;Maintenance of O2 saturations>88%;Exhibits proper breathing techniques, such as pursed lip breathing or other method taught during program session;Compliance with respiratory medication;Demonstrates proper use of MDI's Verbalizes importance of monitoring SPO2 with pulse  oximeter and return demonstration;Maintenance of O2 saturations>88%;Exhibits proper breathing techniques, such as pursed lip breathing or other method taught during program session;Compliance with respiratory medication;Demonstrates proper use of MDI's     Goals/Expected Outcomes Compliance and understanding of oxygen saturation monitoring and breathing techniques to decrease shortness of breath Compliance and understanding of oxygen saturation monitoring and breathing techniques to decrease shortness of breath Compliance and understanding of oxygen saturation monitoring and breathing techniques to decrease shortness of breath              Oxygen Discharge (Final Oxygen Re-Evaluation):  Oxygen Re-Evaluation - 09/22/23 0919       Program Oxygen Prescription   Program Oxygen Prescription None      Home Oxygen   Home Oxygen Device None  Sleep Oxygen Prescription None    Home Exercise Oxygen Prescription None    Home Resting Oxygen Prescription None      Goals/Expected Outcomes   Short Term Goals To learn and understand importance of maintaining oxygen saturations>88%;To learn and demonstrate proper use of respiratory medications;To learn and understand importance of monitoring SPO2 with pulse oximeter and demonstrate accurate use of the pulse oximeter.;To learn and demonstrate proper pursed lip breathing techniques or other breathing techniques.     Long  Term Goals Verbalizes importance of monitoring SPO2 with pulse oximeter and return demonstration;Maintenance of O2 saturations>88%;Exhibits proper breathing techniques, such as pursed lip breathing or other method taught during program session;Compliance with respiratory medication;Demonstrates proper use of MDI's    Goals/Expected Outcomes Compliance and understanding of oxygen saturation monitoring and breathing techniques to decrease shortness of breath             Initial Exercise Prescription:  Initial Exercise Prescription -  07/29/23 1400       Date of Initial Exercise RX and Referring Provider   Date 07/29/23    Referring Provider [name]    Expected Discharge Date 10/27/23      Treadmill   MPH 2    Grade 2    Minutes 15      NuStep   Level 2    SPM 60    Minutes 15      Prescription Details   Frequency (times per week) 2    Duration Progress to 30 minutes of continuous aerobic without signs/symptoms of physical distress      Intensity   THRR 40-80% of Max Heartrate 55-110    Ratings of Perceived Exertion 11-13    Perceived Dyspnea 0-4      Progression   Progression Continue to progress workloads to maintain intensity without signs/symptoms of physical distress.      Resistance Training   Training Prescription Yes    Weight blue bands    Reps 10-15             Perform Capillary Blood Glucose checks as needed.  Exercise Prescription Changes:   Exercise Prescription Changes     Row Name 08/09/23 1500 08/23/23 1500 09/01/23 1400 09/06/23 1400 09/20/23 1500     Response to Exercise   Blood Pressure (Admit) 148/70 142/70 -- 130/66 148/70   Blood Pressure (Exercise) 154/72 -- -- 156/78 168/70   Blood Pressure (Exit) 132/58 136/70 -- 118/60 121/70   Heart Rate (Admit) 76 bpm 81 bpm -- 86 bpm 86 bpm   Heart Rate (Exercise) 104 bpm 110 bpm -- 125 bpm 117 bpm   Heart Rate (Exit) 89 bpm 97 bpm -- 112 bpm 108 bpm   Oxygen Saturation (Admit) 96 % 95 % -- 92 % 96 %   Oxygen Saturation (Exercise) 94 % 92 % -- 91 % 91 %   Oxygen Saturation (Exit) 94 % 94 % -- 95 % 95 %   Rating of Perceived Exertion (Exercise) 13 13 -- 13 13   Perceived Dyspnea (Exercise) 2 2 -- 3 3   Duration Continue with 30 min of aerobic exercise without signs/symptoms of physical distress. Continue with 30 min of aerobic exercise without signs/symptoms of physical distress. -- Continue with 30 min of aerobic exercise without signs/symptoms of physical distress. Continue with 30 min of aerobic exercise without  signs/symptoms of physical distress.   Intensity THRR unchanged THRR unchanged -- THRR unchanged THRR New  55/125     Progression  Progression Continue to progress workloads to maintain intensity without signs/symptoms of physical distress. Continue to progress workloads to maintain intensity without signs/symptoms of physical distress. -- Continue to progress workloads to maintain intensity without signs/symptoms of physical distress. Continue to progress workloads to maintain intensity without signs/symptoms of physical distress.     Resistance Training   Training Prescription Yes Yes -- Yes Yes   Weight blue bands blue bands -- blue bands black bands   Reps 10-15 10-15 -- 10-15 10-15   Time 10 Minutes 10 Minutes -- 10 Minutes 10 Minutes     Treadmill   MPH 2 2 -- 2 2   Grade 2 2 -- 2 3.5   Minutes 15 15 -- 15 15   METs 3.08 3.08 -- 2.9 3.5     NuStep   Level 2 3 -- 5 4   Minutes 15 15 -- 15 15   METs 2.2 3 -- 3.6 2.9     Home Exercise Plan   Plans to continue exercise at -- -- Home (comment)  walking -- --   Frequency -- -- Add 1 additional day to program exercise sessions. -- --   Initial Home Exercises Provided -- -- 09/01/23 -- --    Row Name 09/29/23 1214 10/18/23 1400           Response to Exercise   Blood Pressure (Admit) 128/66 128/66      Blood Pressure (Exercise) -- 132/64      Blood Pressure (Exit) 114/66 96/56      Heart Rate (Admit) 88 bpm 81 bpm      Heart Rate (Exercise) 118 bpm 123 bpm      Heart Rate (Exit) 98 bpm 96 bpm      Oxygen Saturation (Admit) 96 % 96 %      Oxygen Saturation (Exercise) 91 % 92 %      Oxygen Saturation (Exit) 94 % 94 %      Rating of Perceived Exertion (Exercise) 13 13      Perceived Dyspnea (Exercise) 2 3      Duration Continue with 30 min of aerobic exercise without signs/symptoms of physical distress. Continue with 30 min of aerobic exercise without signs/symptoms of physical distress.      Intensity THRR unchanged THRR  unchanged        Progression   Progression Continue to progress workloads to maintain intensity without signs/symptoms of physical distress. Continue to progress workloads to maintain intensity without signs/symptoms of physical distress.        Resistance Training   Training Prescription Yes Yes      Weight black bands black bands      Reps 10-15 10-15      Time 10 Minutes 10 Minutes        Treadmill   MPH 2 2      Grade 3.5 3.5      Minutes 15 15      METs 3.2 3.2        NuStep   Level 4 5      SPM -- 92      Minutes 15 15      METs 2.9 3.2               Exercise Comments:   Exercise Comments     Row Name 08/04/23 1518 09/01/23 1455         Exercise Comments Pt completed his first day of group exercise. He exercised on the treadmill  for 15 min at 2.0 mph, incline 0. He then exercised on the recumbent stepper for 15 min, level 2, METs 2.6. He tolerated well. Performed warm up and cool down with verbal cues and demonstrative cues. Blue bands were a challenge for his shoulders. Discussed METs with good reception. Discussed with pt home exercise plan. He is not currently exercising but wants to begin walking with his wife. Discussed beginning with 1-2 nonrehab days, 15 min x2. He has significant back stiffness that he is seeing physical therapy and doing exercises for. He was agreeable to beginning to walk.               Exercise Goals and Review:   Exercise Goals     Row Name 07/29/23 1343             Exercise Goals   Increase Physical Activity Yes       Intervention Provide advice, education, support and counseling about physical activity/exercise needs.;Develop an individualized exercise prescription for aerobic and resistive training based on initial evaluation findings, risk stratification, comorbidities and participant's personal goals.       Expected Outcomes Short Term: Attend rehab on a regular basis to increase amount of physical activity.;Long Term:  Add in home exercise to make exercise part of routine and to increase amount of physical activity.;Long Term: Exercising regularly at least 3-5 days a week.       Increase Strength and Stamina Yes       Intervention Provide advice, education, support and counseling about physical activity/exercise needs.;Develop an individualized exercise prescription for aerobic and resistive training based on initial evaluation findings, risk stratification, comorbidities and participant's personal goals.       Expected Outcomes Short Term: Increase workloads from initial exercise prescription for resistance, speed, and METs.;Short Term: Perform resistance training exercises routinely during rehab and add in resistance training at home;Long Term: Improve cardiorespiratory fitness, muscular endurance and strength as measured by increased METs and functional capacity ( )       Able to understand and use rate of perceived exertion (RPE) scale Yes       Intervention Provide education and explanation on how to use RPE scale       Expected Outcomes Short Term: Able to use RPE daily in rehab to express subjective intensity level;Long Term:  Able to use RPE to guide intensity level when exercising independently       Able to understand and use Dyspnea scale Yes       Intervention Provide education and explanation on how to use Dyspnea scale       Expected Outcomes Short Term: Able to use Dyspnea scale daily in rehab to express subjective sense of shortness of breath during exertion;Long Term: Able to use Dyspnea scale to guide intensity level when exercising independently       Knowledge and understanding of Target Heart Rate Range (THRR) Yes       Intervention Provide education and explanation of THRR including how the numbers were predicted and where they are located for reference       Expected Outcomes Short Term: Able to state/look up THRR;Long Term: Able to use THRR to govern intensity when exercising  independently;Short Term: Able to use daily as guideline for intensity in rehab       Understanding of Exercise Prescription Yes       Intervention Provide education, explanation, and written materials on patient's individual exercise prescription       Expected Outcomes Short  Term: Able to explain program exercise prescription;Long Term: Able to explain home exercise prescription to exercise independently                Exercise Goals Re-Evaluation :  Exercise Goals Re-Evaluation     Row Name 08/01/23 1458 08/30/23 0928 09/22/23 0916         Exercise Goal Re-Evaluation   Exercise Goals Review Increase Physical Activity;Able to understand and use Dyspnea scale;Understanding of Exercise Prescription;Increase Strength and Stamina;Knowledge and understanding of Target Heart Rate Range (THRR);Able to understand and use rate of perceived exertion (RPE) scale Increase Physical Activity;Able to understand and use Dyspnea scale;Understanding of Exercise Prescription;Increase Strength and Stamina;Knowledge and understanding of Target Heart Rate Range (THRR);Able to understand and use rate of perceived exertion (RPE) scale Increase Physical Activity;Able to understand and use Dyspnea scale;Understanding of Exercise Prescription;Increase Strength and Stamina;Knowledge and understanding of Target Heart Rate Range (THRR);Able to understand and use rate of perceived exertion (RPE) scale     Comments Pt to begin group exercise on 08/04/23. Will progress as tolerated. Pt has completed 5 group exercise sessions. He has missed the last 2 sessions for vacation. He is exercising on the treadmill 15 min, 2 mph, 2 incline, METs 3.08. He then exercises on the recumbent stepper for 15 min, level 3, METs 3.0. He is progressing and tolerating well. He performs warm up and cool down standing, including squats. Using blue bands. Will progress as tolerated. Pt has completed 11 group exercise sessions with good attendance  besides a week vacation. He is exercising on the treadmill 15 min, 2 mph, 3.5 incline, METs 3.5. He then exercises on the recumbent stepper for 15 min, level 4, METs 3.0. He is tolerating well and has slightly progressed with METs on the treadmill but not the stepper. Will encouraged speed. He performs warm up and cool down standing, including squats. Using black bands now. Will progress as tolerated.     Expected Outcomes Through exercise at rehab and at home, the patient will decrease shortness of breath with daily activities and feel confident in carrying out an exercise regime at home. Through exercise at rehab and at home, the patient will decrease shortness of breath with daily activities and feel confident in carrying out an exercise regime at home. Through exercise at rehab and at home, the patient will decrease shortness of breath with daily activities and feel confident in carrying out an exercise regime at home.              Discharge Exercise Prescription (Final Exercise Prescription Changes):  Exercise Prescription Changes - 10/18/23 1400       Response to Exercise   Blood Pressure (Admit) 128/66    Blood Pressure (Exercise) 132/64    Blood Pressure (Exit) 96/56    Heart Rate (Admit) 81 bpm    Heart Rate (Exercise) 123 bpm    Heart Rate (Exit) 96 bpm    Oxygen Saturation (Admit) 96 %    Oxygen Saturation (Exercise) 92 %    Oxygen Saturation (Exit) 94 %    Rating of Perceived Exertion (Exercise) 13    Perceived Dyspnea (Exercise) 3    Duration Continue with 30 min of aerobic exercise without signs/symptoms of physical distress.    Intensity THRR unchanged      Progression   Progression Continue to progress workloads to maintain intensity without signs/symptoms of physical distress.      Resistance Training   Training Prescription Yes    Weight  black bands    Reps 10-15    Time 10 Minutes      Treadmill   MPH 2    Grade 3.5    Minutes 15    METs 3.2      NuStep    Level 5    SPM 92    Minutes 15    METs 3.2             Nutrition:  Target Goals: Understanding of nutrition guidelines, daily intake of sodium 1500mg , cholesterol 200mg , calories 30% from fat and 7% or less from saturated fats, daily to have 5 or more servings of fruits and vegetables.  Biometrics:    Nutrition Therapy Plan and Nutrition Goals:  Nutrition Therapy & Goals - 08/30/23 1352       Nutrition Therapy   Diet Heart Healthy Diet    Drug/Food Interactions Statins/Certain Fruits      Personal Nutrition Goals   Nutrition Goal Patient to improve diet quality by using the plate method as a guide for meal planning to include lean protein/plant protein, fruits, vegetables, whole grains, nonfat dairy as part of a well-balanced diet.   goal in progress.   Personal Goal #2 Patient to identify strategies for weight loss of 0.5-2.0# per week.   goal not met.   Comments Goals in progress. Narvel has attended only 6 exercise sessions. Patient has medical history of COPD stage2, HTN, dyslipidemia. Patient's A1c is in a prediabetic range and liver enzymes are elevated. RUQ ultrasound from 04/26/23 showed fatty liver disease; he does report daily alcohol intake. He does report motivation to lose ~5-10# to aid with improving breathing; he is down 1.5# since starting with our program. He enjoys a wide variety of foods. Patient will benefit from participation in pulmonary rehab for nutrition, exercise, and lifestyle modification.      Intervention Plan   Intervention Prescribe, educate and counsel regarding individualized specific dietary modifications aiming towards targeted core components such as weight, hypertension, lipid management, diabetes, heart failure and other comorbidities.;Nutrition handout(s) given to patient.    Expected Outcomes Short Term Goal: Understand basic principles of dietary content, such as calories, fat, sodium, cholesterol and nutrients.;Long Term Goal: Adherence  to prescribed nutrition plan.             Nutrition Assessments:  Nutrition Assessments - 10/25/23 1552       Rate Your Plate Scores   Post Score 48            MEDIFICTS Score Key: >=70 Need to make dietary changes  40-70 Heart Healthy Diet <= 40 Therapeutic Level Cholesterol Diet  Flowsheet Row PULMONARY REHAB CHRONIC OBSTRUCTIVE PULMONARY DISEASE from 08/04/2023 in [facility], Vascular, & [facility]  Picture Your Plate Total Score on Admission 55      Picture Your Plate Scores: <13 Unhealthy dietary pattern with much room for improvement. 41-50 Dietary pattern unlikely to meet recommendations for good health and room for improvement. 51-60 More healthful dietary pattern, with some room for improvement.  >60 Healthy dietary pattern, although there may be some specific behaviors that could be improved.    Nutrition Goals Re-Evaluation:  Nutrition Goals Re-Evaluation     Row Name 08/04/23 1401 08/30/23 1352           Goals   Current Weight 193 lb 9 oz (87.8 kg) 192 lb 0.3 oz (87.1 kg)      Comment A1c 5.8, AST 40, ALT 58, lipids  WNL, LDL 91 (lipitor, fenofibrate) no new labs; most recent labs A1c 5.8, AST 40, ALT 58, lipids WNL, LDL 91 (lipitor, fenofibrate)      Expected Outcome Patient has medical history of COPD stage2, HTN, dyslipidemia. Patient's A1c is in a prediabetic range and liver enzymes are elevated. RUQ ultrasound from 04/26/23 showed fatty liver disease; he does report daily alcohol intake. He does report motivation to lose ~5-10# to aid with improving breathing. He enjoys a wide variety of foods. Patient will benefit from participation in pulmonary rehab for nutrition, exercise, and lifestyle modification. Goals in progress. Welford has attended only 6 exercise sessions. Patient has medical history of COPD stage2, HTN, dyslipidemia. Patient's A1c is in a prediabetic range and liver enzymes are elevated. RUQ ultrasound  from 04/26/23 showed fatty liver disease; he does report daily alcohol intake. He does report motivation to lose ~5-10# to aid with improving breathing; he is down 1.5# since starting with our program. He enjoys a wide variety of foods. Patient will benefit from participation in pulmonary rehab for nutrition, exercise, and lifestyle modification.               Nutrition Goals Discharge (Final Nutrition Goals Re-Evaluation):  Nutrition Goals Re-Evaluation - 08/30/23 1352       Goals   Current Weight 192 lb 0.3 oz (87.1 kg)    Comment no new labs; most recent labs A1c 5.8, AST 40, ALT 58, lipids WNL, LDL 91 (lipitor, fenofibrate)    Expected Outcome Goals in progress. Mondell has attended only 6 exercise sessions. Patient has medical history of COPD stage2, HTN, dyslipidemia. Patient's A1c is in a prediabetic range and liver enzymes are elevated. RUQ ultrasound from 04/26/23 showed fatty liver disease; he does report daily alcohol intake. He does report motivation to lose ~5-10# to aid with improving breathing; he is down 1.5# since starting with our program. He enjoys a wide variety of foods. Patient will benefit from participation in pulmonary rehab for nutrition, exercise, and lifestyle modification.             Psychosocial: Target Goals: Acknowledge presence or absence of significant depression and/or stress, maximize coping skills, provide positive support system. Participant is able to verbalize types and ability to use techniques and skills needed for reducing stress and depression.  Initial Review & Psychosocial Screening:  Initial Psych Review & Screening - 07/29/23 1333       Initial Review   Current issues with Current Sleep Concerns    Comments Pt has over the counter medication to help him fall asleep. Pt denies any psychosocial needs at this time.      Family Dynamics   Good Support System? Yes    Comments wife, son and daughter-in-law      Barriers   Psychosocial  barriers to participate in program There are no identifiable barriers or psychosocial needs.      Screening Interventions   Interventions Encouraged to exercise             Quality of Life Scores:  Scores of 19 and below usually indicate a poorer quality of life in these areas.  A difference of  2-3 points is a clinically meaningful difference.  A difference of 2-3 points in the total score of the Quality of Life Index has been associated with significant improvement in overall quality of life, self-image, physical symptoms, and general health in studies assessing change in quality of life.  PHQ-9: Review Flowsheet  More data exists  10/27/2023 10/25/2023 09/27/2023 07/29/2023 03/31/2023  Depression screen PHQ 2/9  Decreased Interest 2 2 0 1 0 0  Down, Depressed, Hopeless 1 1 1 1  0 0  PHQ - 2 Score 3 3 1 2  0 0  Altered sleeping 0 0 - 0 0  Tired, decreased energy 0 2 - 0 0  Change in appetite 0 0 - 0 0  Feeling bad or failure about yourself  1 1 - 1 0  Trouble concentrating 0 2 - 1 0  Moving slowly or fidgety/restless 0 0 - 0 0  Suicidal thoughts 0 1 - 0 0  PHQ-9 Score 4 9 - 4 0  Difficult doing work/chores Somewhat difficult Somewhat difficult - Somewhat difficult Not difficult at all    Details       Multiple values from one day are sorted in reverse-chronological order        Interpretation of Total Score  Total Score Depression Severity:  1-4 = Minimal depression, 5-9 = Mild depression, 10-14 = Moderate depression, 15-19 = Moderately severe depression, 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    Expected Outcomes For Judith to participate in PR free of psychosocial concerns.    Continue Psychosocial  Services  No Follow up required             Psychosocial Re-Evaluation:  Psychosocial Re-Evaluation     Row Name 08/05/23 1121 09/02/23 1004 09/28/23 1046 11/02/23 1118       Psychosocial Re-Evaluation   Current issues with Current Sleep Concerns Current Sleep Concerns Current Sleep Concerns Current Sleep Concerns    Comments Guyton still endorses early morning fatigue but states he is taking sleeping meds to keep him asleep throughout the night. Encouraged pt to speak to MD if he is concerned or his sleep habits worsen. Hoan denies any other psychosocial barriers or concern. Denies any needs at this time. Psychosocial monthly re-evaluation is as follows: Aeneas states that his sleeping habits have improved. He stated more restful sleep waking up more energized than prior. He stated not 100% but better. Michaeljames denies any other psychosocial barriers or concern. He denies any needs at this time. Psychosocial monthly re-evaluation is as follows: Linzy denies any psychosocial barriers or concerns. He states he is getting better sleep. He denies any needs currently. Psychosocial monthly re-evaluation is as follows: Dareld denies any psychosocial barriers or concerns. He stated his sleep is a lot better with exercising. He denies any needs.    Expected Outcomes For Ashlee is get more restful sleep and continue to attend PR without any other psychosocial barriers or concerns. For Lonell is get more restful sleep and continue to attend PR without any other psychosocial barriers or concerns. For Narinder to continue attending PR without any psychosocial barriers or concerns. For Savone to continue attending PR without any psychosocial barriers or concerns.    Interventions Encouraged to attend Pulmonary Rehabilitation for the exercise Encouraged to attend Pulmonary Rehabilitation for the exercise Encouraged to attend Pulmonary Rehabilitation for the exercise Encouraged to attend Pulmonary Rehabilitation  for the exercise    Continue Psychosocial Services  No Follow up required No Follow up required No Follow up required No Follow up required  Psychosocial Discharge (Final Psychosocial Re-Evaluation):  Psychosocial Re-Evaluation - 11/02/23 1118       Psychosocial Re-Evaluation   Current issues with Current Sleep Concerns    Comments Psychosocial monthly re-evaluation is as follows: Zydarius denies any psychosocial barriers or concerns. He stated his sleep is a lot better with exercising. He denies any needs.    Expected Outcomes For Larz to continue attending PR without any psychosocial barriers or concerns.    Interventions Encouraged to attend Pulmonary Rehabilitation for the exercise    Continue Psychosocial Services  No Follow up required             Education: Education Goals: Education classes will be provided on a weekly basis, covering required topics. Participant will state understanding/return demonstration of topics presented.  Learning Barriers/Preferences:  Learning Barriers/Preferences - 07/29/23 1337       Learning Barriers/Preferences   Learning Barriers Sight    Learning Preferences Skilled Demonstration             Education Topics: Know Your Numbers Group instruction that is supported by a PowerPoint presentation. Instructor discusses importance of knowing and understanding resting, exercise, and post-exercise oxygen saturation, heart rate, and blood pressure. Oxygen saturation, heart rate, blood pressure, rating of perceived exertion, and dyspnea are reviewed along with a normal range for these values.  Flowsheet Row PULMONARY REHAB CHRONIC OBSTRUCTIVE PULMONARY DISEASE from 10/13/2023 in [facility] for Heart, Vascular, & Lung Health  Date 10/13/23  Educator EP  Instruction Review Code 1- Verbalizes Understanding       Exercise for the Pulmonary Patient Group instruction that is supported by a PowerPoint  presentation. Instructor discusses benefits of exercise, core components of exercise, frequency, duration, and intensity of an exercise routine, importance of utilizing pulse oximetry during exercise, safety while exercising, and options of places to exercise outside of rehab.  Flowsheet Row PULMONARY REHAB CHRONIC OBSTRUCTIVE PULMONARY DISEASE from 10/06/2023 in [facility] for Heart, Vascular, & Lung Health  Date 10/06/23  Educator EP  Instruction Review Code 1- Verbalizes Understanding       MET Level  Group instruction provided by PowerPoint, verbal discussion, and written material to support subject matter. Instructor reviews what METs are and how to increase METs.  Flowsheet Row PULMONARY REHAB CHRONIC OBSTRUCTIVE PULMONARY DISEASE from 09/01/2023 in [facility] for Heart, Vascular, & Lung Health  Date 09/01/23  Educator EP  Instruction Review Code 1- Verbalizes Understanding       Pulmonary Medications Verbally interactive group education provided by instructor with focus on inhaled medications and proper administration. Flowsheet Row PULMONARY REHAB CHRONIC OBSTRUCTIVE PULMONARY DISEASE from 09/29/2023 in [facility] for Heart, Vascular, & Lung Health  Date 09/29/23  Educator RT  Instruction Review Code 1- Verbalizes Understanding       Anatomy and Physiology of the Respiratory System Group instruction provided by PowerPoint, verbal discussion, and written material to support subject matter. Instructor reviews respiratory cycle and anatomical components of the respiratory system and their functions. Instructor also reviews differences in obstructive and restrictive respiratory diseases with examples of each.  Flowsheet Row PULMONARY REHAB CHRONIC OBSTRUCTIVE PULMONARY DISEASE from 09/22/2023 in [facility] for Heart, Vascular, & Lung Health  Date 09/22/23  Educator RT  Instruction  Review Code 1- Verbalizes Understanding       Oxygen Safety Group instruction provided by PowerPoint, verbal discussion, and written material to support subject matter. There is  an overview of "What is Oxygen" and "Why do we need it".  Instructor also reviews how to create a safe environment for oxygen use, the importance of using oxygen as prescribed, and the risks of noncompliance. There is a brief discussion on traveling with oxygen and resources the patient may utilize. Flowsheet Row PULMONARY REHAB CHRONIC OBSTRUCTIVE PULMONARY DISEASE from 10/20/2023 in [facility] for Heart, Vascular, & Lung Health  Date 10/20/23  Educator RN  Instruction Review Code 1- Verbalizes Understanding       Oxygen Use Group instruction provided by PowerPoint, verbal discussion, and written material to discuss how supplemental oxygen is prescribed and different types of oxygen supply systems. Resources for more information are provided.  Flowsheet Row PULMONARY REHAB CHRONIC OBSTRUCTIVE PULMONARY DISEASE from 10/27/2023 in [facility] for Heart, Vascular, & Lung Health  Date 10/27/23  Educator RT  Instruction Review Code 1- Verbalizes Understanding       Breathing Techniques Group instruction that is supported by demonstration and informational handouts. Instructor discusses the benefits of pursed lip and diaphragmatic breathing and detailed demonstration on how to perform both.  Flowsheet Row PULMONARY REHAB CHRONIC OBSTRUCTIVE PULMONARY DISEASE from 08/04/2023 in [facility] for Heart, Vascular, & Lung Health  Date 08/04/23  Educator RN  Instruction Review Code 1- Verbalizes Understanding        Risk Factor Reduction Group instruction that is supported by a PowerPoint presentation. Instructor discusses the definition of a risk factor, different risk factors for pulmonary disease, and how the heart and lungs work  together.   Pulmonary Diseases Group instruction provided by PowerPoint, verbal discussion, and written material to support subject matter. Instructor gives an overview of the different type of pulmonary diseases. There is also a discussion on risk factors and symptoms as well as ways to manage the diseases.   Stress and Energy Conservation Group instruction provided by PowerPoint, verbal discussion, and written material to support subject matter. Instructor gives an overview of stress and the impact it can have on the body. Instructor also reviews ways to reduce stress. There is also a discussion on energy conservation and ways to conserve energy throughout the day. Flowsheet Row PULMONARY REHAB CHRONIC OBSTRUCTIVE PULMONARY DISEASE from 08/11/2023 in [facility] for Heart, Vascular, & Lung Health  Date 08/11/23  Educator RN  Instruction Review Code 1- Verbalizes Understanding       Warning Signs and Symptoms Group instruction provided by PowerPoint, verbal discussion, and written material to support subject matter. Instructor reviews warning signs and symptoms of stroke, heart attack, cold and flu. Instructor also reviews ways to prevent the spread of infection.   Other Education Group or individual verbal, written, or video instructions that support the educational goals of the pulmonary rehab program.    Knowledge Questionnaire Score:  Knowledge Questionnaire Score - 10/25/23 1552       Knowledge Questionnaire Score   Post Score 14/18             Core Components/Risk Factors/Patient Goals at Admission:  Personal Goals and Risk Factors at Admission - 07/29/23 1338       Core Components/Risk Factors/Patient Goals on Admission    Weight Management Yes;Weight Loss    Intervention Weight Management: Develop a combined nutrition and exercise program designed to reach desired caloric intake, while maintaining appropriate intake of nutrient and fiber,  sodium and fats, and appropriate energy expenditure required for the weight goal.;Weight Management:  Provide education and appropriate resources to help participant work on and attain dietary goals.;Weight Management/Obesity: Establish reasonable short term and long term weight goals.;Obesity: Provide education and appropriate resources to help participant work on and attain dietary goals.    Expected Outcomes Short Term: Continue to assess and modify interventions until short term weight is achieved;Long Term: Adherence to nutrition and physical activity/exercise program aimed toward attainment of established weight goal;Weight Maintenance: Understanding of the daily nutrition guidelines, which includes 25-35% calories from fat, 7% or less cal from saturated fats, less than 200mg  cholesterol, less than 1.5gm of sodium, & 5 or more servings of fruits and vegetables daily;Understanding recommendations for meals to include 15-35% energy as protein, 25-35% energy from fat, 35-60% energy from carbohydrates, less than 200mg  of dietary cholesterol, 20-35 gm of total fiber daily;Weight Loss: Understanding of general recommendations for a balanced deficit meal plan, which promotes 1-2 lb weight loss per week and includes a negative energy balance of [phone] kcal/d;Understanding of distribution of calorie intake throughout the day with the consumption of 4-5 meals/snacks    Improve shortness of breath with ADL's Yes    Intervention Provide education, individualized exercise plan and daily activity instruction to help decrease symptoms of SOB with activities of daily living.    Expected Outcomes Short Term: Improve cardiorespiratory fitness to achieve a reduction of symptoms when performing ADLs;Long Term: Be able to perform more ADLs without symptoms or delay the onset of symptoms    Increase knowledge of respiratory medications and ability to use respiratory devices properly  Yes    Intervention Provide education and  demonstration as needed of appropriate use of medications, inhalers, and oxygen therapy.    Expected Outcomes Short Term: Achieves understanding of medications use. Understands that oxygen is a medication prescribed by physician. Demonstrates appropriate use of inhaler and oxygen therapy.;Long Term: Maintain appropriate use of medications, inhalers, and oxygen therapy.             Core Components/Risk Factors/Patient Goals Review:   Goals and Risk Factor Review     Row Name 08/05/23 1126 09/02/23 1011 09/28/23 1049 11/02/23 1121       Core Components/Risk Factors/Patient Goals Review   Personal Goals Review Weight Management/Obesity;Improve shortness of breath with ADL's;Develop more efficient breathing techniques such as purse lipped breathing and diaphragmatic breathing and practicing self-pacing with activity.;Increase knowledge of respiratory medications and ability to use respiratory devices properly. Weight Management/Obesity;Improve shortness of breath with ADL's;Develop more efficient breathing techniques such as purse lipped breathing and diaphragmatic breathing and practicing self-pacing with activity. Weight Management/Obesity;Improve shortness of breath with ADL's Weight Management/Obesity;Improve shortness of breath with ADL's    Review Unable to assess goals. Lamare has attended 1 PR session so far. He will continue to benefit from PR for nutrition, education, exercise, and lifestyle modification. Core components/risk factors/patient goals monthly re-evaluate are as follows: Goal progressing for weight loss. Jazavion has lost ~1.5# from starting the program 3 weeks ago. He is working with our dietician on small changes he can make in his diet. Shaquille' goal of decreasing his shortness of breath with ADLs and developing more efficient breathing techniques such as purse lipped breathing and diaphragmatic breathing; and practicing self-pacing with activity is in progress. He has maintained  his O2 saturation >88% on room air with exertion. He has also been able to increase his workload and METs. Goal met on Increasing knowledge of respiratory medications and ability to use respiratory devices properly. Reeve has brought his inhalers to  class and has demonstrated how to properly use them with our respiratory therapist. After providing feedback on how/when to use, Tyyon now understands, and we review with him before class. Jahan will continue to benefit from participation in PR for nutrition, education, exercise, and lifestyle modification. Core components/risk factors/patient goals monthly re-evaluate are as follows: Goal progressing for weight loss. Elijah Birk has lost ~6# from starting the program. He worked with our dietician on small changes he can make in his diet. He hopes to keep the weight off around the holidays. Goal progressing in decreasing his shortness of breath with ADLs. He has maintained his O2 saturation >88% on room air with exertion. He has also been able to increase his workload and METs. Goal met on developing more efficient breathing techniques such as purse lipped breathing and diaphragmatic breathing; and practicing self-pacing with activity is in progress. Elijah Birk will continue to benefit from participation in PR for nutrition, education, exercise, and lifestyle modification. Core components/risk factors/patient goals monthly re-evaluate are as follows: Goal progressing for weight loss. Elijah Birk has lost ~7.5# since starting the program. He worked with our dietician on small changes he can make in his diet. He hopes to continue losing. Goal progressing in decreasing his shortness of breath with ADLs. Elijah Birk has maintained his O2 saturation >88% on room air with exertion. He has also been able to increase his workload and METs. He is exercising on the treadmill and NuStep and feels confidant and good about himself with the progress he's made. Elijah Birk will continue to benefit from participation in  PR for nutrition, education, exercise, and lifestyle modification.    Expected Outcomes For Youssouf to improve shortness of breath with ADL's, increase knowledge of respiratory medications and ability to use respiratory devices properly, to lose weight and develop more efficient breathing techniques such as purse lipped breathing and diaphragmatic breathing; and practicing self-pacing with activity For Maisie Fus to improve shortness of breath with ADL's, to lose weight, and develop more efficient breathing techniques such as purse lipped breathing and diaphragmatic breathing; and practicing self-pacing with activity For Tom to improve his shortness of breath with ADL's and to lose weight For Tom to improve his shortness of breath with ADL's and to lose weight             Core Components/Risk Factors/Patient Goals at Discharge (Final Review):   Goals and Risk Factor Review - 11/02/23 1121       Core Components/Risk Factors/Patient Goals Review   Personal Goals Review Weight Management/Obesity;Improve shortness of breath with ADL's    Review Core components/risk factors/patient goals monthly re-evaluate are as follows: Goal progressing for weight loss. Elijah Birk has lost ~7.5# since starting the program. He worked with our dietician on small changes he can make in his diet. He hopes to continue losing. Goal progressing in decreasing his shortness of breath with ADLs. Elijah Birk has maintained his O2 saturation >88% on room air with exertion. He has also been able to increase his workload and METs. He is exercising on the treadmill and NuStep and feels confidant and good about himself with the progress he's made. Elijah Birk will continue to benefit from participation in PR for nutrition, education, exercise, and lifestyle modification.    Expected Outcomes For Tom to improve his shortness of breath with ADL's and to lose weight             ITP Comments: Pt is making expected progress toward Pulmonary Rehab goals after  completing 22 session(s). Recommend continued exercise,  life style modification, education, and utilization of breathing techniques to increase stamina and strength, while also decreasing shortness of breath with exertion.     Comments: Dr. Mechele Collin is Medical Director for Pulmonary Rehab at [facility].

## 2023-11-03 ENCOUNTER — Encounter (HOSPITAL_COMMUNITY)
Admission: RE | Admit: 2023-11-03 | Discharge: 2023-11-03 | Disposition: A | Discharge status: Home / Self Care | Payer: Medicare Other | Source: Ambulatory Visit | Attending: Internal Medicine | Admitting: Internal Medicine

## 2023-11-03 DIAGNOSIS — J449 Chronic obstructive pulmonary disease, unspecified: Secondary | ICD-10-CM | POA: Diagnosis not present

## 2023-11-03 NOTE — Progress Notes (Signed)
Daily Session Note  Patient Details  Name: Austin [name] MRN: [id number] Date of Birth: 02/11/1941 Referring Provider:   Doristine Devoid Pulmonary Rehab Walk Test from 07/29/2023 in [facility]  Referring Provider [name]       Encounter Date: 11/03/2023  Check In:  Session Check In - 11/03/23 1345       Check-In   Supervising physician immediately available to respond to emergencies [facility] MD immediately available    Physician(s) Ranae Pila, NP    Location [facility]    Staff Present Essie Hart, RN, BSN;Cassandra Harbold Charlean Sanfilippo, MS, ACSM-CEP, Exercise Physiologist    Virtual Visit No    Medication changes reported     No    Fall or balance concerns reported    No    Tobacco Cessation No Change    Warm-up and Cool-down Performed as group-led instruction    Resistance Training Performed Yes    VAD Patient? No    PAD/SET Patient? No      Pain Assessment   Currently in Pain? No/denies    Multiple Pain Sites No             Capillary Blood Glucose: No results found for this or any previous visit (from the past 24 hours).    Social History   Tobacco Use  Smoking Status Former   Types: Cigars   Quit date: 10/22/2019   Years since quitting: 4.0  Smokeless Tobacco Never  Tobacco Comments   smoked occ. with son. did not tolerate it long. 12/14/19.    Goals Met:  Proper associated with RPD/PD & O2 Sat Independence with exercise equipment Exercise tolerated well No report of concerns or symptoms today Strength training completed today  Goals Unmet:  Not Applicable  Comments: Service time is from 1309 to 1430.    Dr. Mechele Collin is Medical Director for Pulmonary Rehab at [facility].

## 2023-11-08 ENCOUNTER — Encounter (HOSPITAL_COMMUNITY)
Admission: RE | Admit: 2023-11-08 | Discharge: 2023-11-08 | Disposition: A | Discharge status: Home / Self Care | Payer: Medicare Other | Source: Ambulatory Visit | Attending: Internal Medicine

## 2023-11-08 DIAGNOSIS — J449 Chronic obstructive pulmonary disease, unspecified: Secondary | ICD-10-CM

## 2023-11-08 NOTE — Progress Notes (Signed)
Daily Session Note  Patient Details  Name: Austin [name] MRN: [id number] Date of Birth: 10-23-1940 Referring Provider:   Doristine Devoid Pulmonary Rehab Walk Test from 07/29/2023 in [facility]  Referring Provider [name]       Encounter Date: 11/08/2023  Check In:  Session Check In - 11/08/23 1348       Check-In   Supervising physician immediately available to respond to emergencies [facility] MD immediately available    Physician(s) Tereso Newcomer, PA-C    [facility]    Staff Present Essie Hart, RN, BSN;Casey Katrinka Blazing, Zella Richer, MS, ACSM-CEP, Exercise Physiologist;David Manus Gunning, MS, ACSM-CEP, CCRP, Exercise Physiologist    Virtual Visit No    Medication changes reported     No    Fall or balance concerns reported    No    Tobacco Cessation No Change    Warm-up and Cool-down Performed as group-led instruction    Resistance Training Performed Yes    VAD Patient? No    PAD/SET Patient? No      Pain Assessment   Currently in Pain? No/denies    Multiple Pain Sites No             Capillary Blood Glucose: No results found for this or any previous visit (from the past 24 hours).    Social History   Tobacco Use  Smoking Status Former   Types: Cigars   Quit date: 10/22/2019   Years since quitting: 4.0  Smokeless Tobacco Never  Tobacco Comments   smoked occ. with son. did not tolerate it long. 12/14/19.    Goals Met:  Exercise tolerated well No report of concerns or symptoms today Strength training completed today  Goals Unmet:  Not Applicable  Comments: Service time is from 1308 to 1435    Dr. Mechele Collin is Medical Director for Pulmonary Rehab at [facility].

## 2023-11-10 ENCOUNTER — Encounter (HOSPITAL_COMMUNITY)
Admission: RE | Admit: 2023-11-10 | Discharge: 2023-11-10 | Disposition: A | Discharge status: Home / Self Care | Payer: Medicare Other | Source: Ambulatory Visit | Attending: Internal Medicine | Admitting: Internal Medicine

## 2023-11-10 DIAGNOSIS — J449 Chronic obstructive pulmonary disease, unspecified: Secondary | ICD-10-CM | POA: Diagnosis not present

## 2023-11-10 NOTE — Progress Notes (Signed)
Daily Session Note  Patient Details  Name: Austin [name] MRN: [id number] Date of Birth: 02-Jan-1941 Referring Provider:   Doristine Devoid Pulmonary Rehab Walk Test from 07/29/2023 in [facility]  Referring Provider [name]       Encounter Date: 11/10/2023  Check In:  Session Check In - 11/10/23 1322       Check-In   Supervising physician immediately available to respond to emergencies [facility] MD immediately available    Physician(s) Tereso Newcomer, PA-C    Location [facility]    Staff Present Essie Hart, RN, BSN;Renesmae Donahey Katrinka Blazing, Zella Richer, MS, ACSM-CEP, Exercise Physiologist;Randi Dionisio Paschal, ACSM-CEP, Exercise Physiologist    Virtual Visit No    Medication changes reported     No    Fall or balance concerns reported    No    Tobacco Cessation No Change    Warm-up and Cool-down Performed as group-led instruction    Resistance Training Performed Yes    VAD Patient? No    PAD/SET Patient? No      Pain Assessment   Currently in Pain? No/denies    Multiple Pain Sites No             Capillary Blood Glucose: No results found for this or any previous visit (from the past 24 hours).    Social History   Tobacco Use  Smoking Status Former   Types: Cigars   Quit date: 10/22/2019   Years since quitting: 4.0  Smokeless Tobacco Never  Tobacco Comments   smoked occ. with son. did not tolerate it long. 12/14/19.    Goals Met:  Proper associated with RPD/PD & O2 Sat Independence with exercise equipment Exercise tolerated well No report of concerns or symptoms today Strength training completed today  Goals Unmet:  Not Applicable  Comments: Service time is from 1313 to 1437.    Dr. Mechele Collin is Medical Director for Pulmonary Rehab at [facility].

## 2023-11-11 NOTE — Progress Notes (Signed)
Discharge Progress Report  Patient Details  Name: Austin [name] MRN: [id number] Date of Birth: 06/29/1941 Referring Provider:   Doristine Devoid Pulmonary Rehab Walk Test from 07/29/2023 in [facility]  Referring Provider Desai        Number of Visits: 25  Reason for Discharge:  Patient has met program and personal goals.  Smoking History:  Social History   Tobacco Use  Smoking Status Former   Types: Cigars   Quit date: 10/22/2019   Years since quitting: 4.0  Smokeless Tobacco Never  Tobacco Comments   smoked occ. with son. did not tolerate it long. 12/14/19.    Diagnosis:  Stage 2 moderate COPD by GOLD classification [facility])  ADL [facility]:  Pulmonary Assessment Scores     Row Name 07/29/23 1341 10/25/23 1545 10/27/23 1456     ADL [facility]SD   ADL Phase Entry Exit Exit   SOB Score total 53 41 --     CAT Score   CAT Score 15 -- 7     mMRC Score   mMRC Score 3 3 --            Initial Exercise Prescription:  Initial Exercise Prescription - 07/29/23 1400       Date of Initial Exercise RX and Referring Provider   Date 07/29/23    Referring Provider [name]    Expected Discharge Date 10/27/23      Treadmill   MPH 2    Grade 2    Minutes 15      NuStep   Level 2    SPM 60    Minutes 15      Prescription Details   Frequency (times per week) 2    Duration Progress to 30 minutes of continuous aerobic without signs/symptoms of physical distress      Intensity   THRR 40-80% of Max Heartrate 55-110    Ratings of Perceived Exertion 11-13    Perceived Dyspnea 0-4      Progression   Progression Continue to progress workloads to maintain intensity without signs/symptoms of physical distress.      Resistance Training   Training Prescription Yes    Weight blue bands    Reps 10-15             Discharge Exercise Prescription (Final Exercise Prescription Changes):  Exercise Prescription Changes -  10/18/23 1400       Response to Exercise   Blood Pressure (Admit) 128/66    Blood Pressure (Exercise) 132/64    Blood Pressure (Exit) 96/56    Heart Rate (Admit) 81 bpm    Heart Rate (Exercise) 123 bpm    Heart Rate (Exit) 96 bpm    Oxygen Saturation (Admit) 96 %    Oxygen Saturation (Exercise) 92 %    Oxygen Saturation (Exit) 94 %    Rating of Perceived Exertion (Exercise) 13    Perceived Dyspnea (Exercise) 3    Duration Continue with 30 min of aerobic exercise without signs/symptoms of physical distress.    Intensity THRR unchanged      Progression   Progression Continue to progress workloads to maintain intensity without signs/symptoms of physical distress.      Resistance Training   Training Prescription Yes    Weight black bands    Reps 10-15    Time 10 Minutes      Treadmill   MPH 2    Grade 3.5    Minutes 15  METs 3.2      NuStep   Level 5    SPM 92    Minutes 15    METs 3.2             Functional Capacity:  6 Minute Walk     Row Name 07/29/23 1427 10/25/23 1537       6 Minute Walk   Phase Initial Discharge    Distance 1170 feet 1338 feet    Distance % Change -- 14.36 %    Distance Feet Change -- 168 ft    Walk Time 6 minutes 6 minutes    # of Rest Breaks 0 0    MPH 2.22 2.53    METS 2.9 3.26    RPE 15 15    Perceived Dyspnea  2 3    VO2 Peak 10.14 11.42    Symptoms No Yes (comment)    Comments -- Pt feeling tired today. Thinks he might be getting sick. HR higher than normal for him.    Resting HR 81 bpm 104 bpm    Resting BP 138/68 134/64    Resting Oxygen Saturation  96 % 95 %    Exercise Oxygen Saturation  during 6 min walk 90 % 89 %    Max Ex. HR 128 bpm 155 bpm    Max Ex. BP 198/80 172/66    2 Minute Post BP 140/74 140/66      Interval HR   1 Minute HR 104 122    2 Minute HR 113 138    3 Minute HR 115 148    4 Minute HR 121 155    5 Minute HR 124 150    6 Minute HR 128 140    2 Minute Post HR 98 126    Interval Heart Rate?  Yes --      Interval Oxygen   Interval Oxygen? Yes Yes    Baseline Oxygen Saturation % 96 % 95 %    1 Minute Oxygen Saturation % 96 % 95 %    1 Minute Liters of Oxygen 0 L 0 L    2 Minute Oxygen Saturation % 91 % 90 %    2 Minute Liters of Oxygen 0 L 0 L    3 Minute Oxygen Saturation % 91 % 90 %    3 Minute Liters of Oxygen 0 L 0 L    4 Minute Oxygen Saturation % 91 % 89 %    4 Minute Liters of Oxygen 0 L 0 L    5 Minute Oxygen Saturation % 90 % 89 %    5 Minute Liters of Oxygen 0 L 0 L    6 Minute Oxygen Saturation % 90 % 89 %    6 Minute Liters of Oxygen 0 L 0 L    2 Minute Post Oxygen Saturation % 95 % 96 %    2 Minute Post Liters of Oxygen 0 L 0 L             Psychological, QOL, Others - Outcomes: PHQ 2/9:    10/27/2023    2:57 PM 10/25/2023    3:52 PM 09/27/2023   11:19 AM 07/29/2023    2:32 PM 03/31/2023    1:54 PM  Depression screen PHQ 2/9  Decreased Interest 2 2 0 1 0  Down, Depressed, Hopeless 1 1 1 1  0  PHQ - 2 Score 3 3 1 2  0  Altered sleeping 0 0  0 0  Tired, decreased energy 0 2  0 0  Change in appetite 0 0  0 0  Feeling bad or failure about yourself  1 1  1  0  Trouble concentrating 0 2  1 0  Moving slowly or fidgety/restless 0 0  0 0  Suicidal thoughts 0 1  0 0  PHQ-9 Score 4 9  4  0  Difficult doing work/chores Somewhat difficult Somewhat difficult  Somewhat difficult Not difficult at all    Quality of Life:   Personal Goals: Goals established at orientation with interventions provided to work toward goal.  Personal Goals and Risk Factors at Admission - 07/29/23 1338       Core Components/Risk Factors/Patient Goals on Admission    Weight Management Yes;Weight Loss    Intervention Weight Management: Develop a combined nutrition and exercise program designed to reach desired caloric intake, while maintaining appropriate intake of nutrient and fiber, sodium and fats, and appropriate energy expenditure required for the weight goal.;Weight  Management: Provide education and appropriate resources to help participant work on and attain dietary goals.;Weight Management/Obesity: Establish reasonable short term and long term weight goals.;Obesity: Provide education and appropriate resources to help participant work on and attain dietary goals.    Expected Outcomes Short Term: Continue to assess and modify interventions until short term weight is achieved;Long Term: Adherence to nutrition and physical activity/exercise program aimed toward attainment of established weight goal;Weight Maintenance: Understanding of the daily nutrition guidelines, which includes 25-35% calories from fat, 7% or less cal from saturated fats, less than 200mg  cholesterol, less than 1.5gm of sodium, & 5 or more servings of fruits and vegetables daily;Understanding recommendations for meals to include 15-35% energy as protein, 25-35% energy from fat, 35-60% energy from carbohydrates, less than 200mg  of dietary cholesterol, 20-35 gm of total fiber daily;Weight Loss: Understanding of general recommendations for a balanced deficit meal plan, which promotes 1-2 lb weight loss per week and includes a negative energy balance of [phone] kcal/d;Understanding of distribution of calorie intake throughout the day with the consumption of 4-5 meals/snacks    Improve shortness of breath with ADL's Yes    Intervention Provide education, individualized exercise plan and daily activity instruction to help decrease symptoms of SOB with activities of daily living.    Expected Outcomes Short Term: Improve cardiorespiratory fitness to achieve a reduction of symptoms when performing ADLs;Long Term: Be able to perform more ADLs without symptoms or delay the onset of symptoms    Increase knowledge of respiratory medications and ability to use respiratory devices properly  Yes    Intervention Provide education and demonstration as needed of appropriate use of medications, inhalers, and oxygen therapy.     Expected Outcomes Short Term: Achieves understanding of medications use. Understands that oxygen is a medication prescribed by physician. Demonstrates appropriate use of inhaler and oxygen therapy.;Long Term: Maintain appropriate use of medications, inhalers, and oxygen therapy.              Personal Goals Discharge:  Goals and Risk Factor Review     Row Name 08/05/23 1126 09/02/23 1011 09/28/23 1049 11/02/23 1121 11/11/23 0927     Core Components/Risk Factors/Patient Goals Review   Personal Goals Review Weight Management/Obesity;Improve shortness of breath with ADL's;Develop more efficient breathing techniques such as purse lipped breathing and diaphragmatic breathing and practicing self-pacing with activity.;Increase knowledge of respiratory medications and ability to use respiratory devices properly. Weight Management/Obesity;Improve shortness of breath with ADL's;Develop more efficient breathing techniques such as purse lipped breathing  and diaphragmatic breathing and practicing self-pacing with activity. Weight Management/Obesity;Improve shortness of breath with ADL's Weight Management/Obesity;Improve shortness of breath with ADL's Weight Management/Obesity   Review Unable to assess goals. Austin [name] has attended 1 PR session so far. He will continue to benefit from PR for nutrition, education, exercise, and lifestyle modification. Core components/risk factors/patient goals monthly re-evaluate are as follows: Goal progressing for weight loss. Austin [name] has lost ~1.5# from starting the program 3 weeks ago. He is working with our dietician on small changes he can make in his diet. Qadir' goal of decreasing his shortness of breath with ADLs and developing more efficient breathing techniques such as purse lipped breathing and diaphragmatic breathing; and practicing self-pacing with activity is in progress. He has maintained his O2 saturation >88% on room air with exertion. He has also been able to  increase his workload and METs. Goal met on Increasing knowledge of respiratory medications and ability to use respiratory devices properly. Austin [name] has brought his inhalers to class and has demonstrated how to properly use them with our respiratory therapist. After providing feedback on how/when to use, Austin [name] now understands, and we review with him before class. Austin [name] will continue to benefit from participation in PR for nutrition, education, exercise, and lifestyle modification. Core components/risk factors/patient goals monthly re-evaluate are as follows: Goal progressing for weight loss. Austin [name] has lost ~6# from starting the program. He worked with our dietician on small changes he can make in his diet. He hopes to keep the weight off around the holidays. Goal progressing in decreasing his shortness of breath with ADLs. He has maintained his O2 saturation >88% on room air with exertion. He has also been able to increase his workload and METs. Goal met on developing more efficient breathing techniques such as purse lipped breathing and diaphragmatic breathing; and practicing self-pacing with activity is in progress. Austin [name] will continue to benefit from participation in PR for nutrition, education, exercise, and lifestyle modification. Core components/risk factors/patient goals monthly re-evaluate are as follows: Goal progressing for weight loss. Austin [name] has lost ~7.5# since starting the program. He worked with our dietician on small changes he can make in his diet. He hopes to continue losing. Goal progressing in decreasing his shortness of breath with ADLs. Austin [name] has maintained his O2 saturation >88% on room air with exertion. He has also been able to increase his workload and METs. He is exercising on the treadmill and NuStep and feels confidant and good about himself with the progress he's made. Austin [name] will continue to benefit from participation in PR for nutrition, education, exercise, and lifestyle modification. Austin [name]  graduated from the [employer] on 11/10/23. He had good attendance and enjoyed participating in the program. Goal for weight loss was met. Austin [name] is down 6#'s at the time of graduation. Austin [name] also met his goal on improving shortness of breath with ADL's. His SOB score decreased from 53 to 41. We wish Austin [name] the best.   Expected Outcomes For Austin [name] to improve shortness of breath with ADL's, increase knowledge of respiratory medications and ability to use respiratory devices properly, to lose weight and develop more efficient breathing techniques such as purse lipped breathing and diaphragmatic breathing; and practicing self-pacing with activity For Austin [name] to improve shortness of breath with ADL's, to lose weight, and develop more efficient breathing techniques such as purse lipped breathing and diaphragmatic breathing; and practicing self-pacing with activity For Austin [name] to improve his shortness of breath with ADL's and to lose weight For Austin [name] to improve  his shortness of breath with ADL's and to lose weight --            Exercise Goals and Review:  Exercise Goals     Row Name 07/29/23 1343             Exercise Goals   Increase Physical Activity Yes       Intervention Provide advice, education, support and counseling about physical activity/exercise needs.;Develop an individualized exercise prescription for aerobic and resistive training based on initial evaluation findings, risk stratification, comorbidities and participant's personal goals.       Expected Outcomes Short Term: Attend rehab on a regular basis to increase amount of physical activity.;Long Term: Add in home exercise to make exercise part of routine and to increase amount of physical activity.;Long Term: Exercising regularly at least 3-5 days a week.       Increase Strength and Stamina Yes       Intervention Provide advice, education, support and counseling about physical activity/exercise needs.;Develop an individualized exercise  prescription for aerobic and resistive training based on initial evaluation findings, risk stratification, comorbidities and participant's personal goals.       Expected Outcomes Short Term: Increase workloads from initial exercise prescription for resistance, speed, and METs.;Short Term: Perform resistance training exercises routinely during rehab and add in resistance training at home;Long Term: Improve cardiorespiratory fitness, muscular endurance and strength as measured by increased METs and functional capacity ( )       Able to understand and use rate of perceived exertion (RPE) scale Yes       Intervention Provide education and explanation on how to use RPE scale       Expected Outcomes Short Term: Able to use RPE daily in rehab to express subjective intensity level;Long Term:  Able to use RPE to guide intensity level when exercising independently       Able to understand and use Dyspnea scale Yes       Intervention Provide education and explanation on how to use Dyspnea scale       Expected Outcomes Short Term: Able to use Dyspnea scale daily in rehab to express subjective sense of shortness of breath during exertion;Long Term: Able to use Dyspnea scale to guide intensity level when exercising independently       Knowledge and understanding of Target Heart Rate Range (THRR) Yes       Intervention Provide education and explanation of THRR including how the numbers were predicted and where they are located for reference       Expected Outcomes Short Term: Able to state/look up THRR;Long Term: Able to use THRR to govern intensity when exercising independently;Short Term: Able to use daily as guideline for intensity in rehab       Understanding of Exercise Prescription Yes       Intervention Provide education, explanation, and written materials on patient's individual exercise prescription       Expected Outcomes Short Term: Able to explain program exercise prescription;Long Term: Able to explain  home exercise prescription to exercise independently                Exercise Goals Re-Evaluation:  Exercise Goals Re-Evaluation     Row Name 08/01/23 1458 08/30/23 0928 09/22/23 0916 11/10/23 1550       Exercise Goal Re-Evaluation   Exercise Goals Review Increase Physical Activity;Able to understand and use Dyspnea scale;Understanding of Exercise Prescription;Increase Strength and Stamina;Knowledge and understanding of Target Heart Rate Range (THRR);Able to understand and  use rate of perceived exertion (RPE) scale Increase Physical Activity;Able to understand and use Dyspnea scale;Understanding of Exercise Prescription;Increase Strength and Stamina;Knowledge and understanding of Target Heart Rate Range (THRR);Able to understand and use rate of perceived exertion (RPE) scale Increase Physical Activity;Able to understand and use Dyspnea scale;Understanding of Exercise Prescription;Increase Strength and Stamina;Knowledge and understanding of Target Heart Rate Range (THRR);Able to understand and use rate of perceived exertion (RPE) scale Increase Physical Activity;Able to understand and use Dyspnea scale;Understanding of Exercise Prescription;Increase Strength and Stamina;Knowledge and understanding of Target Heart Rate Range (THRR);Able to understand and use rate of perceived exertion (RPE) scale    Comments Pt to begin group exercise on 08/04/23. Will progress as tolerated. Pt has completed 5 group exercise sessions. He has missed the last 2 sessions for vacation. He is exercising on the treadmill 15 min, 2 mph, 2 incline, METs 3.08. He then exercises on the recumbent stepper for 15 min, level 3, METs 3.0. He is progressing and tolerating well. He performs warm up and cool down standing, including squats. Using blue bands. Will progress as tolerated. Pt has completed 11 group exercise sessions with good attendance besides a week vacation. He is exercising on the treadmill 15 min, 2 mph, 3.5 incline,  METs 3.5. He then exercises on the recumbent stepper for 15 min, level 4, METs 3.0. He is tolerating well and has slightly progressed with METs on the treadmill but not the stepper. Will encouraged speed. He performs warm up and cool down standing, including squats. Using black bands now. Will progress as tolerated. Pt has completed  25 group exercise sessions with good attendance besides a week vacation. He is exercising on the treadmill 15 min, 2 mph, 6 incline, METs 4.0 with frequent rest stops. He then exercised on the recumbent stepper for 15 min, level 5, METs 3.8 max. He progressed to black bands. He increased his 6 min walk test by 14.4 %, 168 ft. His SOB and CAT scales both decreased. He has been successful in exercise and plans to walk at home.    Expected Outcomes Through exercise at rehab and at home, the patient will decrease shortness of breath with daily activities and feel confident in carrying out an exercise regime at home. Through exercise at rehab and at home, the patient will decrease shortness of breath with daily activities and feel confident in carrying out an exercise regime at home. Through exercise at rehab and at home, the patient will decrease shortness of breath with daily activities and feel confident in carrying out an exercise regime at home. Through exercise at rehab and at home, the patient will decrease shortness of breath with daily activities and feel confident in carrying out an exercise regime at home.             Nutrition & Weight - Outcomes:    Nutrition:  Nutrition Therapy & Goals - 08/30/23 1352       Nutrition Therapy   Diet Heart Healthy Diet    Drug/Food Interactions Statins/Certain Fruits      Personal Nutrition Goals   Nutrition Goal Patient to improve diet quality by using the plate method as a guide for meal planning to include lean protein/plant protein, fruits, vegetables, whole grains, nonfat dairy as part of a well-balanced diet.   goal in  progress.   Personal Goal #2 Patient to identify strategies for weight loss of 0.5-2.0# per week.   goal not met.   Comments Goals in progress. Tasha has  attended only 6 exercise sessions. Patient has medical history of COPD stage2, HTN, dyslipidemia. Patient's A1c is in a prediabetic range and liver enzymes are elevated. RUQ ultrasound from 04/26/23 showed fatty liver disease; he does report daily alcohol intake. He does report motivation to lose ~5-10# to aid with improving breathing; he is down 1.5# since starting with our program. He enjoys a wide variety of foods. Patient will benefit from participation in pulmonary rehab for nutrition, exercise, and lifestyle modification.      Intervention Plan   Intervention Prescribe, educate and counsel regarding individualized specific dietary modifications aiming towards targeted core components such as weight, hypertension, lipid management, diabetes, heart failure and other comorbidities.;Nutrition handout(s) given to patient.    Expected Outcomes Short Term Goal: Understand basic principles of dietary content, such as calories, fat, sodium, cholesterol and nutrients.;Long Term Goal: Adherence to prescribed nutrition plan.             Nutrition Discharge:  Nutrition Assessments - 10/25/23 1552       Rate Your Plate Scores   Post Score 48             Education Questionnaire Score:  Knowledge Questionnaire Score - 10/25/23 1552       Knowledge Questionnaire Score   Post Score 14/18             Goals reviewed with patient; copy given to patient.

## 2023-11-21 ENCOUNTER — Other Ambulatory Visit: Payer: Self-pay | Admitting: Family Medicine

## 2023-12-06 ENCOUNTER — Encounter: Payer: Self-pay | Admitting: Family Medicine

## 2023-12-06 ENCOUNTER — Ambulatory Visit (INDEPENDENT_AMBULATORY_CARE_PROVIDER_SITE_OTHER): Payer: Medicare Other | Admitting: Family Medicine

## 2023-12-06 VITALS — BP 132/70 | HR 98 | Temp 98.2°F | Ht 69.0 in | Wt 183.2 lb

## 2023-12-06 DIAGNOSIS — M26629 Arthralgia of temporomandibular joint, unspecified side: Secondary | ICD-10-CM

## 2023-12-06 DIAGNOSIS — E538 Deficiency of other specified B group vitamins: Secondary | ICD-10-CM | POA: Diagnosis not present

## 2023-12-06 DIAGNOSIS — F321 Major depressive disorder, single episode, moderate: Secondary | ICD-10-CM | POA: Diagnosis not present

## 2023-12-06 DIAGNOSIS — I10 Essential (primary) hypertension: Secondary | ICD-10-CM | POA: Diagnosis not present

## 2023-12-06 DIAGNOSIS — E559 Vitamin D deficiency, unspecified: Secondary | ICD-10-CM | POA: Diagnosis not present

## 2023-12-06 DIAGNOSIS — C61 Malignant neoplasm of prostate: Secondary | ICD-10-CM

## 2023-12-06 DIAGNOSIS — G4700 Insomnia, unspecified: Secondary | ICD-10-CM

## 2023-12-06 DIAGNOSIS — R739 Hyperglycemia, unspecified: Secondary | ICD-10-CM | POA: Diagnosis not present

## 2023-12-06 DIAGNOSIS — E785 Hyperlipidemia, unspecified: Secondary | ICD-10-CM | POA: Diagnosis not present

## 2023-12-06 LAB — COMPREHENSIVE METABOLIC PANEL
ALT: 60 U/L — ABNORMAL HIGH (ref 0–53)
AST: 45 U/L — ABNORMAL HIGH (ref 0–37)
Albumin: 3.9 g/dL (ref 3.5–5.2)
Alkaline Phosphatase: 61 U/L (ref 39–117)
BUN: 11 mg/dL (ref 6–23)
CO2: 29 meq/L (ref 19–32)
Calcium: 8.8 mg/dL (ref 8.4–10.5)
Chloride: 103 meq/L (ref 96–112)
Creatinine, Ser: 0.52 mg/dL (ref 0.40–1.50)
GFR: 93.54 mL/min (ref >60.00)
Glucose, Bld: 100 mg/dL — ABNORMAL HIGH (ref 70–99)
Potassium: 3.6 meq/L (ref 3.5–5.1)
Sodium: 141 meq/L (ref 135–145)
Total Bilirubin: 0.8 mg/dL (ref 0.2–1.2)
Total Protein: 7.1 g/dL (ref 6.0–8.3)

## 2023-12-06 LAB — CBC
HCT: 42.5 % (ref 39.0–52.0)
Hemoglobin: 14.4 g/dL (ref 13.0–17.0)
MCHC: 33.8 g/dL (ref 30.0–36.0)
MCV: 99.9 fl (ref 78.0–100.0)
Platelets: 248 10*3/uL (ref 150.0–400.0)
RBC: 4.26 Mil/uL (ref 4.22–5.81)
RDW: 13.7 % (ref 11.5–15.5)
WBC: 5.9 10*3/uL (ref 4.0–10.5)

## 2023-12-06 LAB — LIPID PANEL
Cholesterol: 147 mg/dL (ref 0–200)
HDL: 48.2 mg/dL (ref >39.00)
LDL Cholesterol: 82 mg/dL (ref 0–99)
NonHDL: 98.59
Total CHOL/HDL Ratio: 3
Triglycerides: 84 mg/dL (ref 0.0–149.0)
VLDL: 16.8 mg/dL (ref 0.0–40.0)

## 2023-12-06 LAB — PSA: PSA: 7.83 ng/mL — ABNORMAL HIGH (ref 0.10–4.00)

## 2023-12-06 LAB — TSH: TSH: 1.93 u[IU]/mL (ref 0.35–5.50)

## 2023-12-06 LAB — MAGNESIUM: Magnesium: 1.7 mg/dL (ref 1.5–2.5)

## 2023-12-06 LAB — VITAMIN B12: Vitamin B-12: 472 pg/mL (ref 211–911)

## 2023-12-06 LAB — HEMOGLOBIN A1C: Hgb A1c MFr Bld: 6.1 % (ref 4.6–6.5)

## 2023-12-06 MED ORDER — MELOXICAM 15 MG PO TABS
15.0000 mg | ORAL_TABLET | Freq: Every day | ORAL | 0 refills | Status: DC
Start: 2023-12-06 — End: 2024-01-03

## 2023-12-06 MED ORDER — BACLOFEN 10 MG PO TABS: 5.0000 mg | ORAL_TABLET | Freq: Four times a day (QID) | ORAL | 0 refills | Status: DC | PRN

## 2023-12-06 MED ORDER — BUPROPION HCL ER (XL) 150 MG PO TB24: 150.0000 mg | ORAL_TABLET | Freq: Every day | ORAL | 3 refills | Status: DC

## 2023-12-06 NOTE — Assessment & Plan Note (Signed)
 Check B12

## 2023-12-06 NOTE — Assessment & Plan Note (Signed)
 Check A1c.

## 2023-12-06 NOTE — Assessment & Plan Note (Signed)
 Check lipids.  He is on Lipitor 80 mg daily and fenofibrate 200 mg daily.

## 2023-12-06 NOTE — Patient Instructions (Addendum)
 It was very nice to see you today!  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 Please start the meloxicam and baclofen. Work on the exercises.  Please start the Wellbutrin.   We will check blood work today.  Return in about 1 month (around 01/03/2024) for Follow Up.   Take care, Dr Jimmey Ralph  PLEASE NOTE:  If you had any lab tests, please let us know if you have not heard back within a few days. You may see your results on mychart before we have a chance to review them but we will give you a call once they are reviewed by [place].   If we ordered any referrals today, please let us know if you have not heard from their office within the next week.   If you had any urgent prescriptions sent in today, please check with the pharmacy within an hour of our visit to make sure the prescription was transmitted appropriately.   Please try these tips to maintain a healthy lifestyle:  Eat at least 3 REAL meals and 1-2 snacks per day.  Aim for no more than 5 hours between eating.  If you eat breakfast, please do so within one hour of getting up.   Each meal should contain half fruits/vegetables, one quarter protein, and one quarter carbs (no bigger than a computer mouse)  Cut down on sweet beverages. This includes juice, soda, and sweet tea.   Drink at least 1 glass of water with each meal and aim for at least 8 glasses per day  Exercise at least 150 minutes every week.    Preventive Care 41 Years and Older, Male Preventive care refers to lifestyle choices and visits with your health care provider that can promote health and wellness. Preventive care visits are also called wellness exams. What can I expect for my preventive care visit? Counseling During your preventive care visit, your health care provider may ask about your: Medical history, including: Past medical problems. Family medical history. History of falls. Current health, including: Emotional well-being. Home life and relationship  well-being. Sexual activity. Memory and ability to understand (cognition). Lifestyle, including: Alcohol, nicotine or tobacco, and drug use. Access to firearms. Diet, exercise, and sleep habits. Work and work Astronomer. Sunscreen use. Safety issues such as seatbelt and bike helmet use. Physical exam Your health care provider will check your: Height and weight. These may be used to calculate your BMI (body mass index). BMI is a measurement that tells if you are at a healthy weight. Waist circumference. This measures the distance around your waistline. This measurement also tells if you are at a healthy weight and may help predict your risk of certain diseases, such as type 2 diabetes and high blood pressure. Heart rate and blood pressure. Body temperature. Skin for abnormal spots. What immunizations do I need?  Vaccines are usually given at various ages, according to a schedule. Your health care provider will recommend vaccines for you based on your age, medical history, and lifestyle or other factors, such as travel or where you work. What tests do I need? Screening Your health care provider may recommend screening tests for certain conditions. This may include: Lipid and cholesterol levels. Diabetes screening. This is done by checking your blood sugar (glucose) after you have not eaten for a while (fasting). Hepatitis C test. Hepatitis B test. HIV (human immunodeficiency virus) test. STI (sexually transmitted infection) testing, if you are at risk. Lung cancer screening. Colorectal cancer screening. Prostate cancer screening. Abdominal aortic aneurysm (  AAA) screening. You may need this if you are a current or former smoker. Talk with your health care provider about your test results, treatment options, and if necessary, the need for more tests. Follow these instructions at home: Eating and drinking  Eat a diet that includes fresh fruits and vegetables, whole grains, lean  protein, and low-fat dairy products. Limit your intake of foods with high amounts of sugar, saturated fats, and salt. Take vitamin and mineral supplements as recommended by your health care provider. Do not drink alcohol if your health care provider tells you not to drink. If you drink alcohol: Limit how much you have to 0-2 drinks a day. Know how much alcohol is in your drink. In the [place] one drink equals one 12 oz bottle of beer (355 mL), one 5 oz glass of wine (148 mL), or one 1 oz glass of hard liquor (44 mL). Lifestyle Brush your teeth every morning and night with fluoride toothpaste. Floss one time each day. Exercise for at least 30 minutes 5 or more days each week. Do not use any products that contain nicotine or tobacco. These products include cigarettes, chewing tobacco, and vaping devices, such as e-cigarettes. If you need help quitting, ask your health care provider. Do not use drugs. If you are sexually active, practice safe sex. Use a condom or other form of protection to prevent STIs. Take aspirin only as told by your health care provider. Make sure that you understand how much to take and what form to take. Work with your health care provider to find out whether it is safe and beneficial for you to take aspirin daily. Ask your health care provider if you need to take a cholesterol-lowering medicine (statin). Find healthy ways to manage stress, such as: Meditation, yoga, or listening to music. Journaling. Talking to a trusted person. Spending time with friends and family. Safety Always wear your seat belt while driving or riding in a vehicle. Do not drive: If you have been drinking alcohol. Do not ride with someone who has been drinking. When you are tired or distracted. While texting. If you have been using any mind-altering substances or drugs. Wear a helmet and other protective equipment during sports activities. If you have firearms in your house, make sure you follow  all gun safety procedures. Minimize exposure to UV radiation to reduce your risk of skin cancer. What's next? Visit your health care provider once a year for an annual wellness visit. Ask your health care provider how often you should have your eyes and teeth checked. Stay up to date on all vaccines. This information is not intended to replace advice given to you by your health care provider. Make sure you discuss any questions you have with your health care provider. Document Revised: 03/25/2021 Document Reviewed: 03/25/2021 Elsevier Patient Education  2024 [employer] Inc[employer]   Jaw Range of Motion Exercises Jaw range of motion exercises are exercises that help your jaw move better. Exercises that help you have good posture (postural exercises) also help relieve jaw discomfort. These are often done along with range of motion exercises. These exercises can help prevent or improve: Trouble opening your mouth. Pain in your jaw while it is open or closed. Temporomandibular joint (TMJ) pain. Headache caused by jaw tension. Take other actions to prevent or relieve jaw pain, such as: Avoiding things that cause or increase jaw pain. This may include: Chewing gum or eating hard foods. Clenching your jaw or teeth, grinding your teeth, or  keeping tension in your jaw muscles. Opening your mouth wide, such as for a big yawn. Leaning on your jaw, such as resting your jaw in your hand while leaning on a desk. Putting ice on your jaw. To do this: Put ice in a plastic bag. Place a towel between your skin and the bag. Leave the ice on for 20 minutes, 2-3 times a day. Remove the ice if your skin turns bright red. This is very important. If you cannot feel pain, heat, or cold, you have a greater risk of damage to the area. Only do jaw exercises that your health care provider recommends. Only move your jaw as far as it can comfortably go in each direction. Do not move your jaw into positions that cause  pain. Range of motion exercises Repeat each of these exercises 8 times, 1-2 times a day, or as told by your health care provider. Exercise A: Forward protrusion  Push your jaw forward. Hold this position for 1-2 seconds. Let your jaw return to its normal position and rest it there for 1-2 seconds. Exercise B: Controlled opening  Stand or sit in front of a mirror. Place your tongue on the roof of your mouth, just behind your top teeth. Keeping your tongue on the roof of your mouth, slowly open and close your mouth. While you open and close your mouth, watch your jaw in the mirror. Try to keep your jaw from moving to one side or the other. Exercise C: Right and left motion  Move your jaw right. Hold this position for 1-2 seconds. Let your jaw return to its normal position, and rest it there for 1-2 seconds. Move your jaw left. Hold this position for 1-2 seconds. Let your jaw return to its normal position, and rest it there for 1-2 seconds. Postural exercises Exercise A: Chin tucks  You can do this exercise sitting, standing, or lying down. Move your head straight back, keeping your head level. You can guide the movement by placing your fingers on your chin to push your jaw back in an even motion. You should be able to feel a double chin form at the end of the motion. Hold this position for 5 seconds. Repeat 10-15 times. Exercise B: Shoulder blade squeeze  Sit or stand. Bend your elbows to about 90 degrees, which is the shape of a capital letter "L." Keep your upper arms by your body. Squeeze your shoulder blades down and back, as though you were trying to touch your elbows behind you. Do not shrug your shoulders or move your head. Hold this position for 5 seconds. Repeat 10-15 times. Exercise C: Chest stretch  Stand in a doorway with one of your feet slightly in front of the other. This is called a staggered stance. If you cannot reach your forearms to the door frame, do this exercise in  a corner of a room. Put both of your hands and your forearms on the door frame, with your arms wide apart. Make sure your arms are at a 90-degree angle to your body. Place your hands on the door frame at the height of your elbows. Slowly move your weight onto your front foot until you feel a stretch across your chest and in the front of your shoulders. Keep your head and chest upright and keep your abdominal muscles tight. Do not lean in. Hold this position for 30 seconds. Repeat 3 times. Contact a health care provider if: You have jaw pain that is new  or gets worse. You have clicking or popping sounds while doing the exercises. Get help right away if: Your jaw is stuck in one place and you cannot move it. You cannot open or close your mouth. Summary Jaw range of motion exercises are exercises that help your jaw move better. Take actions to prevent or relieve jaw pain: limit chewing gum or eating hard foods; clenching your jaw or teeth; or leaning on your jaw, such as resting your jaw in your hand while leaning on a desk. Repeat each of the jaw range of motion exercises 8 times, 1-2 times a day, or as told by your health care provider. Contact a health care provider if you have clicking or popping sounds while doing the exercises. This information is not intended to replace advice given to you by your health care provider. Make sure you discuss any questions you have with your health care provider. Document Revised: 05/10/2021 Document Reviewed: 05/10/2021 Elsevier Patient Education  2024 [employer].

## 2023-12-06 NOTE — Progress Notes (Signed)
 Austin [name] is a 83 y.o. male who presents today for an office visit.  Assessment/Plan:  New/Acute Problems: Right Jaw Pain History and exam consistent with TMJ syndrome.  Will start baclofen and meloxicam.  We discussed home exercises and handout was given as well.  Will follow-up with him in a few weeks.  If not improving will need referral to [facility].  Chronic Problems Addressed Today: Insomnia No longer on Lunesta.  Symptoms are controlled with over-the-counter melatonin as needed.  Hyperglycemia Check A1c  Essential hypertension Blood pressure at goal on amlodipine 5 mg daily and losartan 50 mg daily.  Depression, major, single episode, moderate (HCC) His symptoms are not controlled.  He has been off Lexapro for the last year but would like to restart an antidepressant.  No SI or HI.  He declined filling out PHQ today.  We discussed treatment options.  Will start Wellbutrin 150 mg daily.  We also did discuss referral to see a therapist however he declined for today.  He will follow-up with me in a few weeks and we can adjust as needed.  He will let me know if he changes his mind about seeing a therapist.  Dyslipidemia Check lipids.  He is on Lipitor 80 mg daily and fenofibrate 200 mg daily.  Prostate cancer Austin [name]) Check PSA.  Vitamin B12 deficiency Check B12.  Vitamin D deficiency Check vitamin D  Preventative Healthcare    Subjective:  HPI:  See Assessment / plan for status of chronic conditions. HE has been having more right jaw pain for the last several months. He talked to his dentist and they told him to give it more time. Pain worse with talking and chewing. Symptoms are worsening. No treatments tried.   ROS: Per HPI, otherwise a complete review of systems was negative.   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   Chronic low back pain 02/11/2020   Insomnia 02/11/2020   Hyperglycemia 11/08/2019   Vitamin D deficiency 11/08/2019   Essential hypertension 11/08/2019   Gastroesophageal reflux disease 11/08/2019   Depression, major, single episode, 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   Cervical disc disease 11/08/2019   Asthma, persistent controlled 11/08/2019   Prostate cancer (HCC) 11/08/2019   Status post cataract extraction 11/08/2019   Dyslipidemia 11/08/2019   Past Surgical History:  Procedure Laterality Date   GALLBLADDER SURGERY  1997   REPLACEMENT TOTAL KNEE BILATERAL  2001   2007   surgery replacement Bilateral 2005   TOTAL SHOULDER REPLACEMENT Bilateral 2000    Family History  Problem Relation Age of Onset   Diabetes Brother    Heart disease Paternal Grandfather    Asthma Neg Hx     Medications- reviewed and updated Current Outpatient Medications  Medication Sig Dispense Refill   albuterol (PROVENTIL) (2.5 MG/3ML) 0.083% nebulizer solution USE 1 VIAL BY NEBULIZATION EVERY 4 HOURS AS NEEDED FOR WHEEZING OR SHORTNESS OF BREATH/DYSPNEA. J45.998 375 mL 1   albuterol (VENTOLIN HFA) 108 (90 Base) MCG/ACT inhaler  INHALE 1-2 PUFFS BY MOUTH EVERY 6 HOURS AS NEEDED FOR WHEEZE OR SHORTNESS OF BREATH 18 each 5   amLODipine (NORVASC) 5 MG tablet TAKE 1 TABLET (5 MG TOTAL) BY MOUTH DAILY. 90 tablet 3   atorvastatin (LIPITOR) 80 MG tablet TAKE 1 TABLET BY MOUTH EVERY DAY 90 tablet 1   buPROPion (WELLBUTRIN XL) 150 MG 24 hr tablet Take 1 tablet (150 mg total) by mouth daily. 90 tablet 3   Cyanocobalamin (VITAMIN B 12 PO) Take 1,000 Units by mouth.      gabapentin (NEURONTIN) 100 MG capsule TAKE 1 CAPSULE BY MOUTH EVERYDAY AT BEDTIME 30 capsule 0   glucose blood (FREESTYLE LITE) test strip CHECK BLOOD SUGAR THREE TIMES A DAY E11.9 100 strip 12   ibuprofen (ADVIL) 800 MG tablet TAKE 1 TABLET BY MOUTH EVERY 8 HOURS AS NEEDED 30 tablet 0   loratadine (CLARITIN) 10 MG tablet TAKE 1 TABLET BY MOUTH EVERY DAY 90 tablet 3   losartan (COZAAR) 100 MG tablet TAKE 1 TABLET BY MOUTH EVERY DAY 90 tablet 3   magnesium oxide (MAG-OX) 400 (240 Mg) MG tablet TAKE 1 TABLET BY MOUTH EVERY DAY 90 tablet 3   meloxicam (MOBIC) 15 MG tablet Take 1 tablet (15 mg total) by mouth daily. 30 tablet 0   montelukast (SINGULAIR) 10 MG tablet TAKE 1 TABLET BY MOUTH EVERYDAY AT BEDTIME 90 tablet 1   omeprazole (PRILOSEC) 20 MG capsule TAKE 2 CAPSULES BY MOUTH EVERY DAY 180 capsule 1   solifenacin (VESICARE) 5 MG tablet Take 5 mg by mouth daily.     Spacer/Aero-Holding Chambers DEVI 1 each by Does not apply route as directed. 1 each 1   Vitamin D, Cholecalciferol, 50 MCG (2000 UT) CAPS Take by mouth.     baclofen (LIORESAL) 10 MG tablet Take 0.5-1 tablets (5-10 mg total) by mouth 4 (four) times daily as needed for muscle spasms. 30 each 0   BREZTRI AEROSPHERE 160-9-4.8 MCG/ACT AERO INHALE 2 PUFFS INTO THE LUNGS IN THE MORNING AND AT BEDTIME. 10.7 each 1   fenofibrate micronized (LOFIBRA) 200 MG capsule TAKE 1 CAPSULE BY MOUTH EVERY DAY (Patient not taking: Reported on 12/06/2023) 90 capsule 0   Lancets Misc. (UNISTIK 2 EXTRA) MISC Use to check blood sugar 3 times daily/ Dx E11.9 200 each 0   nitrofurantoin, macrocrystal-monohydrate, (MACROBID) 100 MG capsule Take 100 mg by mouth daily. (Patient not taking: Reported on 12/06/2023)     No current facility-administered medications for this visit.    Allergies-reviewed and updated No Known Allergies  Social History   Socioeconomic History   Marital status: Married    Spouse name: Not on file   Number of children: Not on file    Years of education: Not on file   Highest education level: 12th grade  Occupational History   Occupation: retired  Tobacco Use   Smoking status: Former    Types: Cigars    Quit date: 10/22/2019    Years since quitting: 4.1   Smokeless tobacco: Never   Tobacco comments:    smoked occ. with son. did not tolerate it long. 12/14/19.  Vaping Use   Vaping status: Never Used  Substance and Sexual Activity   Alcohol use: Yes   Drug use: Never   Sexual activity: Not Currently  Other Topics Concern   Not on file  Social History Narrative   Not on file   Social Drivers of Health   Financial Resource Strain: Low Risk  (09/27/2023)  Overall Financial Resource Strain (CARDIA)    Difficulty of Paying Living Expenses: Not hard at all  Food Insecurity: No Food Insecurity (09/27/2023)   Hunger Vital Sign    Worried About Running Out of Food in the Last Year: Never true    Ran Out of Food in the Last Year: Never true  Transportation Needs: No Transportation Needs (09/27/2023)   PRAPARE - Administrator, Civil Service (Medical): No    Lack of Transportation (Non-Medical): No  Physical Activity: Insufficiently Active (09/27/2023)   Exercise Vital Sign    Days of Exercise per Week: 3 days    Minutes of Exercise per Session: 30 min  Stress: No Stress Concern Present (09/27/2023)   [employer] of Occupational Health - Occupational Stress Questionnaire    Feeling of Stress : Only a little  Social Connections: Moderately Isolated (09/27/2023)   Social Connection and Isolation Panel [NHANES]    Frequency of Communication with Friends and Family: More than three times a week    Frequency of Social Gatherings with Friends and Family: More than three times a week    Attends Religious Services: Never    Database administrator or Organizations: No    Attends Engineer, structural: Never    Marital Status: Married          Objective:  Physical Exam: BP 132/70    Pulse 98   Temp 98.2 F (36.8 C) (Temporal)   Ht 5\' 9"  (1.753 m)   Wt 183 lb 3.2 oz (83.1 kg)   SpO2 96%   BMI 27.05 kg/m   Gen: No acute distress, resting comfortably HEENT: Right TMJ tender to palpation.  Palpable click with lateral movement. CV: Regular rate and rhythm with no murmurs appreciated Pulm: Normal work of breathing, clear to auscultation bilaterally with no crackles, wheezes, or rhonchi Neuro: Grossly normal, moves all extremities Psych: Normal affect and thought content      Austin [name] M. Jimmey Ralph, MD 12/06/2023 11:46 AM

## 2023-12-06 NOTE — Assessment & Plan Note (Signed)
 No longer on Lunesta.  Symptoms are controlled with over-the-counter melatonin as needed.

## 2023-12-06 NOTE — Assessment & Plan Note (Signed)
 Check vitamin D.

## 2023-12-06 NOTE — Assessment & Plan Note (Signed)
 Check PSA. ?

## 2023-12-06 NOTE — Assessment & Plan Note (Signed)
 Blood pressure at goal on amlodipine 5 mg daily and losartan 50 mg daily.

## 2023-12-06 NOTE — Assessment & Plan Note (Signed)
 His symptoms are not controlled.  He has been off Lexapro for the last year but would like to restart an antidepressant.  No SI or HI.  He declined filling out PHQ today.  We discussed treatment options.  Will start Wellbutrin 150 mg daily.  We also did discuss referral to see a therapist however he declined for today.  He will follow-up with me in a few weeks and we can adjust as needed.  He will let me know if he changes his mind about seeing a therapist.

## 2023-12-08 ENCOUNTER — Encounter: Payer: Self-pay | Admitting: Family Medicine

## 2023-12-08 NOTE — Progress Notes (Signed)
 His PSA is still trending up.  Recommend he follow-up with urology ASAP to discuss next steps in management.  Liver numbers are mildly elevated but stable compared to his previous.  This is probably due to the fatty liver that was found on ultrasound last year.  We can recheck liver numbers again in 6 to 12 months.  He needs to continue to work on diet and exercise.  His A1c is mildly elevated.  Do not need to start meds for this but he needs to continue to work on diet and exercise and we can recheck in 6 to 12 months.  His cholesterol levels are at goal.  The rest of his labs are all at goal.  We can recheck everything else in a year or so.

## 2023-12-13 ENCOUNTER — Telehealth: Payer: Self-pay | Admitting: *Deleted

## 2023-12-13 NOTE — Telephone Encounter (Signed)
 Copied from CRM [id number]. Topic: Clinical - Medication Question >> Dec 13, 2023 12:34 PM Taleah C wrote: Reason for CRM: pt's wife called and stated that the patient has been taking baclofen, 1tablet, 4x/day. However, after the third dose, he is feeling "too relaxed". He would like to confirm with a nurse how many he should continue taking per day. Please call and advise.   Please advise

## 2023-12-14 NOTE — Telephone Encounter (Signed)
 He can try taking half tablet three times per day to see if this lessens side effects.  Katina Degree. Jimmey Ralph, MD 12/14/2023 7:40 AM

## 2023-12-14 NOTE — Telephone Encounter (Signed)
 LVM with information can try taking half tablet three times per day to see if this lessens side effects.  If any question please give [place] a call

## 2023-12-21 ENCOUNTER — Encounter: Payer: Self-pay | Admitting: Family Medicine

## 2023-12-22 ENCOUNTER — Other Ambulatory Visit: Payer: Self-pay

## 2023-12-22 ENCOUNTER — Ambulatory Visit: Admitting: Family Medicine

## 2023-12-22 VITALS — BP 158/80 | HR 98 | Ht 69.0 in | Wt 179.0 lb

## 2023-12-22 DIAGNOSIS — M25552 Pain in left hip: Secondary | ICD-10-CM

## 2023-12-22 NOTE — Patient Instructions (Addendum)
 Thank you for coming in today.  You received an injection today. Seek immediate medical attention if the joint becomes red, extremely painful, or is oozing fluid.   Continue home exercises  Check back as needed

## 2023-12-22 NOTE — Progress Notes (Signed)
   Rubin Payor, PhD, LAT, ATC acting as a scribe for Clementeen Graham, MD.  Austin [name] is a 83 y.o. male who presents to [facility] today for exacerbation of his L hip pain. Pt was last seen by Dr. [name] on 09/07/23 and was given a L ischial tuberosity steroid injection.  Today, pt reports last steroid injection was a little helpful. He has been working out at the senior center and overdid it about a wk ago. Pt locates pain to the posterior aspect of his L thigh and L buttock.   Dx imaging: 08/09/23 L hip XR 04/04/23 L-spine XR  Pertinent review of systems: No fevers or chills  Relevant historical information: Hypertension asthma/COPD.  History of prostate cancer.   Exam:  BP (!) 158/80   Pulse 98   Ht 5\' 9"  (1.753 m)   Wt 179 lb (81.2 kg)   SpO2 97%   BMI 26.43 kg/m  General: Well Developed, well nourished, and in no acute distress.   MSK: Left ischial tuberosity tender palpation.  Pain with resisted hip extension and knee flexion.    Lab and Radiology Results  Procedure: Real-time Ultrasound Guided Injection of the left ischial tuberosity bursa Device: Philips Affiniti 50G/GE Logiq Images permanently stored and available for review in PACS Verbal informed consent obtained.  Discussed risks and benefits of procedure. Warned about infection, bleeding, hyperglycemia damage to structures among others. Patient expresses understanding and agreement Time-out conducted.   Noted no overlying erythema, induration, or other signs of local infection.   Skin prepped in a sterile fashion.   Local anesthesia: Topical Ethyl chloride.   With sterile technique and under real time ultrasound guidance: 40 mg of Kenalog and 2 mL of Marcaine injected into the ischial tuberosity bursa. Fluid seen entering the bursa.   Completed without difficulty   Pain immediately resolved suggesting accurate placement of the medication.   Advised to call if fevers/chills,  erythema, induration, drainage, or persistent bleeding.   Images permanently stored and available for review in the ultrasound unit.  Impression: Technically successful ultrasound guided injection.        Assessment and Plan: 83 y.o. male with chronic posterior left hip pain.  This is an acute recurrence of a chronic problem.  He did pretty well with his steroid injection at the left ischial tuberosity bursa about 3 months ago.  Plan for repeat injection.  If this continues to recur consider MRI.   PDMP not reviewed this encounter. Orders Placed This Encounter  Procedures   [place] LIMITED JOINT SPACE STRUCTURES LOW LEFT(NO LINKED CHARGES)    Reason for Exam (SYMPTOM  OR DIAGNOSIS REQUIRED):   left buttock pain    Preferred imaging location?:   [facility]   No orders of the defined types were placed in this encounter.    Discussed warning signs or symptoms. Please see discharge instructions. Patient expresses understanding.   The above documentation has been reviewed and is accurate and complete Clementeen Graham, M.D.

## 2024-01-03 ENCOUNTER — Ambulatory Visit: Payer: Medicare Other | Admitting: Family Medicine

## 2024-01-03 ENCOUNTER — Other Ambulatory Visit: Payer: Self-pay | Admitting: Family Medicine

## 2024-01-05 ENCOUNTER — Ambulatory Visit (INDEPENDENT_AMBULATORY_CARE_PROVIDER_SITE_OTHER): Admitting: Family Medicine

## 2024-01-05 VITALS — BP 149/80 | HR 85 | Temp 97.5°F | Ht 69.0 in | Wt 189.0 lb

## 2024-01-05 DIAGNOSIS — M26621 Arthralgia of right temporomandibular joint: Secondary | ICD-10-CM | POA: Diagnosis not present

## 2024-01-05 DIAGNOSIS — F321 Major depressive disorder, single episode, moderate: Secondary | ICD-10-CM

## 2024-01-05 DIAGNOSIS — I10 Essential (primary) hypertension: Secondary | ICD-10-CM

## 2024-01-05 DIAGNOSIS — M26629 Arthralgia of temporomandibular joint, unspecified side: Secondary | ICD-10-CM | POA: Insufficient documentation

## 2024-01-05 NOTE — Progress Notes (Signed)
   Anh Mangano is a 83 y.o. male who presents today for an office visit.  Assessment/Plan:  Chronic Problems Addressed Today: Essential hypertension Blood pressure mildly elevated today though typically well-controlled.  We will continue his amlodipine 5 mg daily and losartan 50 mg daily.  He will monitor at home and let us know if persistently elevated.  Depression, major, single episode, moderate (HCC) Doing better with Wellbutrin 150 mg daily.  Tolerating well.  No side effects.  He would like to continue with current dose for now.  Follow-up again in 3 to 6 months though he will let us know if we need to adjust the dose for then.  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  Will place referral to OMFS.     Subjective:  HPI:  See A/P for status of chronic conditions.  Patient is here today for follow-up.  I saw him about a month ago.  At that time we started him on Wellbutrin 150 mg daily.  He has done well with this.  He does note that he has been less irritable.  Mood has been improving.  No significant side effects.  He was also having some issues with TMJ at our last visit. We started baclofen and meloxicam. He has not had much change in symptoms since our last visit. He did not think that the medications have helped. No pain or symptoms at rest however suspect get pain and clicking with chewing or talking.       Objective:  Physical Exam: BP (!) 149/80   Pulse 85   Temp (!) 97.5 F (36.4 C) (Temporal)   Ht 5\' 9"  (1.753 m)   Wt 189 lb (85.7 kg)   SpO2 97%   BMI 27.91 kg/m   Gen: No acute distress, resting comfortably CV: Regular rate and rhythm with no murmurs appreciated Pulm: Normal work of breathing, clear to auscultation bilaterally with no crackles, wheezes, or rhonchi Neuro: Grossly normal, moves all extremities Psych: Normal affect and thought content      Lalonnie Shaffer M. Jimmey Ralph, MD 01/05/2024 11:43 AM

## 2024-01-05 NOTE — Patient Instructions (Signed)
 It was very nice to see you today!  I we will refer you to a oral maxillofacial specialist for your jaw pain.  I am glad you are doing better with Wellbutrin.  We will continue with the current dose.  Let me know if you need to make any changes with this.  Please monitor your blood pressure at home and let us know if it is persistently elevated.  Return in about 6 months (around 07/07/2024) for Follow Up.   Take care, Dr Jimmey Ralph  PLEASE NOTE:  If you had any lab tests, please let us know if you have not heard back within a few days. You may see your results on mychart before we have a chance to review them but we will give you a call once they are reviewed by [place].   If we ordered any referrals today, please let us know if you have not heard from their office within the next week.   If you had any urgent prescriptions sent in today, please check with the pharmacy within an hour of our visit to make sure the prescription was transmitted appropriately.   Please try these tips to maintain a healthy lifestyle:  Eat at least 3 REAL meals and 1-2 snacks per day.  Aim for no more than 5 hours between eating.  If you eat breakfast, please do so within one hour of getting up.   Each meal should contain half fruits/vegetables, one quarter protein, and one quarter carbs (no bigger than a computer mouse)  Cut down on sweet beverages. This includes juice, soda, and sweet tea.   Drink at least 1 glass of water with each meal and aim for at least 8 glasses per day  Exercise at least 150 minutes every week.

## 2024-01-05 NOTE — Assessment & Plan Note (Signed)
 Blood pressure mildly elevated today though typically well-controlled.  We will continue his amlodipine 5 mg daily and losartan 50 mg daily.  He will monitor at home and let us know if persistently elevated.

## 2024-01-05 NOTE — Assessment & Plan Note (Signed)
 Doing better with Wellbutrin 150 mg daily.  Tolerating well.  No side effects.  He would like to continue with current dose for now.  Follow-up again in 3 to 6 months though he will let us know if we need to adjust the dose for then.

## 2024-01-05 NOTE — Assessment & Plan Note (Addendum)
 Did not have much improvement with baclofen and meloxicam.  Will place referral to OMFS.

## 2024-01-10 ENCOUNTER — Ambulatory Visit: Payer: Medicare Other | Admitting: Internal Medicine

## 2024-01-10 ENCOUNTER — Encounter: Payer: Self-pay | Admitting: Internal Medicine

## 2024-01-10 VITALS — BP 154/60 | HR 103 | Ht 69.0 in | Wt 180.2 lb

## 2024-01-10 DIAGNOSIS — K219 Gastro-esophageal reflux disease without esophagitis: Secondary | ICD-10-CM

## 2024-01-10 DIAGNOSIS — J4489 Other specified chronic obstructive pulmonary disease: Secondary | ICD-10-CM

## 2024-01-10 DIAGNOSIS — R682 Dry mouth, unspecified: Secondary | ICD-10-CM | POA: Diagnosis not present

## 2024-01-10 DIAGNOSIS — J310 Chronic rhinitis: Secondary | ICD-10-CM

## 2024-01-10 MED ORDER — BREZTRI AEROSPHERE 160-9-4.8 MCG/ACT IN AERO: 2.0000 | INHALATION_SPRAY | Freq: Two times a day (BID) | RESPIRATORY_TRACT | 5 refills | Status: DC

## 2024-01-10 NOTE — Patient Instructions (Addendum)
 It was a pleasure to see you today!  Please schedule follow up scheduled with myself in 6 months.  If my schedule is not open yet, we will contact you with a reminder closer to that time. Please call [phone] if you haven't heard from [place] a month before, and always call us sooner if issues or concerns arise. You can also send [place] a message through MyChart, but but aware that this is not to be used for urgent issues and it may take up to 5-7 days to receive a reply. Please be aware that you will likely be able to view your results before I have a chance to respond to them. Please give [place] 5 business days to respond to any non-urgent results.     I am so glad your breathing is doing well.  Continue the Breztri 2 puffs twice daily with a spacer.  Please refer to gargle after use this may help your voice hoarseness Continue the acid reflux medicine twice a day Continue the montelukast and Claritin for your allergies as well as the as needed nasal sprays.  I do think your dry mouth could be a medication side effect.  However stopping one of the medications such as the Claritin could also make your dry nasal drainage worse or your asthma worse.  Talk to your dentist to see if there are any options for hydrating mouthwashes.  Otherwise we can always trial coming off the antihistamine to see if this helps.  Continue your regular exercise routine; I think this is helping your lungs and longevity.

## 2024-01-10 NOTE — Progress Notes (Signed)
 Austin [name]    [id number]    1941/08/17  Primary Care Physician:Parker, Katina Degree, MD Date of Appointment: 01/10/2024 Established Patient Visit  Chief complaint:   Chief Complaint  Patient presents with   Follow-up    Doing well    HPI: Austin [name] is a 83 y.o. 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   Interval Updates: Here for asthma copd follow up.   Has since done pulmonary rehab and really enjoyed. Now going to the gym at the senior center 2-3 times/week.   He is using his breztri with spacer. Thinks breztri is affecting his voice but he wasn't gargling consistently.   No interval steroids for breathing this year. Chronic rhinitis well controlled Reflux is well controlled  Current Regimen: breztri 2 puffs twice a day spacer, albuterol Asthma Triggers: seasonal allergies Exacerbations in the last year: usually once/year. Most recently nov 2022 History of hospitalization or intubation: never Hives: denies Allergy Testing: never had.  GERD: yes on prilosec. On PPI omeprazole 40 mg daily Allergic Rhinitis: yes, on nasacort ACT:  Asthma Control Test ACT Total Score  01/10/2024  4:04 PM 21  05/12/2022  2:33 PM 17  12/18/2021 11:33 AM 18   Region 2 allergy panel and cbc with diff showed ige and eos not elevated.  FeNO: 40 ppb  I have reviewed the patient's family social and past medical history and updated as appropriate.   Past Medical History:  Diagnosis Date   Asthma    Depression    Hypertension    Prostate cancer [facility])    Urinary incontinence     Past Surgical History:  Procedure Laterality Date   GALLBLADDER SURGERY  1997   REPLACEMENT TOTAL KNEE BILATERAL  2001   2007   surgery replacement Bilateral 2005   TOTAL SHOULDER REPLACEMENT Bilateral 2000    Family History  Problem Relation Age of Onset   Diabetes Brother    Heart disease Paternal Grandfather    Asthma Neg Hx     Social History    Occupational History   Occupation: retired  Tobacco Use   Smoking status: Former    Types: Cigars    Quit date: 10/22/2019    Years since quitting: 4.2   Smokeless tobacco: Never   Tobacco comments:    smoked occ. with son. did not tolerate it long. 12/14/19.  Vaping Use   Vaping status: Never Used  Substance and Sexual Activity   Alcohol use: Yes   Drug use: Never   Sexual activity: Not Currently     Physical Exam: Blood pressure (!) 154/60, pulse (!) 103, height 5\' 9"  (1.753 m), weight 180 lb 3.2 oz (81.7 kg), SpO2 96%.  Gen:      No distress, well appearing Lungs:   ctab no wheezes or crackles CV:        RRR, systolic murmur  Data Reviewed: Imaging: I have personally reviewed the outside hospital records including pulmonary function tests and reports of CT chest from December 2019 which demonstrates air trapping, tracheobronchomalacia, mild bibasilar atelectasis  PFTs: Spirometry completed April 2021 shows moderately severe airflow limitation with an FEV1 53% of predicted.  I reviewed his PFTs from [facility] in [place] and these also demonstrated Airflow limitation with +BD response.   Labs: Region 2 allergy panel and cbc with diff showed ige and eos not elevated.   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  Continue the Breztri 2 puffs twice daily with a spacer.  Please refer to gargle after use this may help your voice hoarseness  Continue the acid reflux medicine twice a day Continue the montelukast and Claritin for your allergies as well as the as needed nasal sprays.  I do think your dry mouth could be a medication side effect.  However stopping one of the medications such as the Claritin could also make your dry nasal drainage worse or your asthma worse.  Talk to your dentist to see if there are any options for hydrating mouthwashes.  Otherwise we can always trial coming off the antihistamine to see if this helps.  Continue your regular exercise routine; I think this is helping your lungs and longevity.  Return to Care: No follow-ups on file.    Durel Salts, MD Pulmonary and Critical Care Medicine [facility] Office:[phone]

## 2024-01-29 ENCOUNTER — Other Ambulatory Visit: Payer: Self-pay | Admitting: Family Medicine

## 2024-02-01 LAB — PSA: PSA: 6.57

## 2024-02-02 ENCOUNTER — Other Ambulatory Visit: Payer: Self-pay | Admitting: Family Medicine

## 2024-02-02 ENCOUNTER — Other Ambulatory Visit: Payer: Self-pay | Admitting: Internal Medicine

## 2024-02-17 ENCOUNTER — Encounter: Payer: Self-pay | Admitting: Family Medicine

## 2024-02-29 ENCOUNTER — Other Ambulatory Visit: Payer: Self-pay | Admitting: Family Medicine

## 2024-03-02 ENCOUNTER — Other Ambulatory Visit: Payer: Self-pay | Admitting: Family Medicine

## 2024-03-24 ENCOUNTER — Other Ambulatory Visit: Payer: Self-pay | Admitting: Family Medicine

## 2024-03-24 ENCOUNTER — Other Ambulatory Visit: Payer: Self-pay | Admitting: Internal Medicine

## 2024-03-24 DIAGNOSIS — K219 Gastro-esophageal reflux disease without esophagitis: Secondary | ICD-10-CM

## 2024-04-19 ENCOUNTER — Other Ambulatory Visit: Payer: Self-pay | Admitting: Family Medicine

## 2024-04-22 ENCOUNTER — Other Ambulatory Visit: Payer: Self-pay | Admitting: Family Medicine

## 2024-05-15 NOTE — Progress Notes (Unsigned)
 [name] [address] [place] [address] Phone: [phone]   Assessment and Plan:     There are no diagnoses linked to this encounter.  ***   Pertinent previous records reviewed include ***    Follow Up: ***     Subjective:   I, Cora Stetson, am serving as a Neurosurgeon for Doctor Morene Mace  Chief Complaint: L hip pain   HPI:   12/22/2023 Austin [name] is a 83 y.o. male who presents to [facility] at [facility] today for exacerbation of his L hip pain. Pt was last seen by Dr. Joane on 09/07/23 and was given a L ischial tuberosity steroid injection.   Today, pt reports last steroid injection was a little helpful. He has been working out at the senior center and overdid it about a wk ago. Pt locates pain to the posterior aspect of his L thigh and L buttock.    Dx imaging: 08/09/23 L hip XR 04/04/23 L-spine XR   Pertinent review of systems: No fevers or chills   Relevant historical information: Hypertension asthma/COPD.  History of prostate cancer.     Exam:  BP (!) 158/80   Pulse 98   Ht 5' 9 (1.753 m)   Wt 179 lb (81.2 kg)   SpO2 97%   BMI 26.43 kg/m  General: Well Developed, well nourished, and in no acute distress.    MSK: Left ischial tuberosity tender palpation.  Pain with resisted hip extension and knee flexion.  05/16/2024 Patient states   Relevant Historical Information: ***  Additional pertinent review of systems negative.   Current Outpatient Medications:    albuterol  (PROVENTIL ) (2.5 MG/3ML) 0.083% nebulizer solution, USE 1 VIAL BY NEBULIZATION EVERY 4 HOURS AS NEEDED FOR WHEEZING OR SHORTNESS OF BREATH/DYSPNEA. J45.998, Disp: 375 mL, Rfl: 1   albuterol  (VENTOLIN  HFA) 108 (90 Base) MCG/ACT inhaler, INHALE 1-2 PUFFS BY MOUTH EVERY 6 HOURS AS NEEDED FOR WHEEZE OR SHORTNESS OF BREATH, Disp: 18 each, Rfl: 5   amLODipine  (NORVASC ) 5 MG tablet, TAKE 1 TABLET (5 MG TOTAL) BY  MOUTH DAILY., Disp: 90 tablet, Rfl: 3   atorvastatin  (LIPITOR) 80 MG tablet, TAKE 1 TABLET BY MOUTH EVERY DAY, Disp: 90 tablet, Rfl: 1   baclofen  (LIORESAL ) 10 MG tablet, Take 0.5-1 tablets (5-10 mg total) by mouth 4 (four) times daily as needed for muscle spasms., Disp: 30 each, Rfl: 0   budeson-glycopyrrolate-formoterol (BREZTRI  AEROSPHERE) 160-9-4.8 MCG/ACT AERO, Inhale 2 puffs into the lungs in the morning and at bedtime., Disp: 10.7 each, Rfl: 5   buPROPion  (WELLBUTRIN  XL) 150 MG 24 hr tablet, Take 1 tablet (150 mg total) by mouth daily., Disp: 90 tablet, Rfl: 3   Cyanocobalamin  (VITAMIN B 12 PO), Take 1,000 Units by mouth., Disp: , Rfl:    fenofibrate  micronized (LOFIBRA) 200 MG capsule, TAKE 1 CAPSULE BY MOUTH EVERY DAY, Disp: 90 capsule, Rfl: 0   gabapentin  (NEURONTIN ) 100 MG capsule, TAKE 1 CAPSULE BY MOUTH EVERYDAY AT BEDTIME, Disp: 30 capsule, Rfl: 0   glucose blood (FREESTYLE LITE) test strip, CHECK BLOOD SUGAR THREE TIMES A DAY E11.9, Disp: 100 strip, Rfl: 12   ibuprofen  (ADVIL ) 800 MG tablet, TAKE 1 TABLET BY MOUTH EVERY 8 HOURS AS NEEDED, Disp: 30 tablet, Rfl: 0   Lancets Misc. (UNISTIK 2 EXTRA) MISC, Use to check blood sugar 3 times daily/ Dx E11.9, Disp: 200 each, Rfl: 0   loratadine  (CLARITIN ) 10 MG tablet, TAKE 1  TABLET BY MOUTH EVERY DAY, Disp: 90 tablet, Rfl: 3   losartan  (COZAAR ) 100 MG tablet, TAKE 1 TABLET BY MOUTH EVERY DAY, Disp: 90 tablet, Rfl: 3   magnesium oxide (MAG-OX) 400 (240 Mg) MG tablet, TAKE 1 TABLET BY MOUTH EVERY DAY, Disp: 90 tablet, Rfl: 3   meloxicam  (MOBIC ) 15 MG tablet, TAKE 1 TABLET (15 MG TOTAL) BY MOUTH DAILY., Disp: 30 tablet, Rfl: 0   montelukast  (SINGULAIR ) 10 MG tablet, TAKE 1 TABLET BY MOUTH EVERYDAY AT BEDTIME, Disp: 90 tablet, Rfl: 1   nitrofurantoin, macrocrystal-monohydrate, (MACROBID) 100 MG capsule, Take 100 mg by mouth daily., Disp: , Rfl:    omeprazole  (PRILOSEC) 20 MG capsule, TAKE 2 CAPSULES BY MOUTH EVERY DAY, Disp: 180 capsule, Rfl: 1    solifenacin (VESICARE) 5 MG tablet, Take 5 mg by mouth daily., Disp: , Rfl:    Spacer/Aero-Holding Chambers DEVI, 1 each by Does not apply route as directed., Disp: 1 each, Rfl: 1   Vitamin D , Cholecalciferol, 50 MCG (2000 UT) CAPS, Take by mouth., Disp: , Rfl:    Objective:     There were no vitals filed for this visit.    There is no height or weight on file to calculate BMI.    Physical Exam:    ***   Electronically signed by:  Odis Mace D.[name] 11:50 AM 05/15/24

## 2024-05-16 ENCOUNTER — Ambulatory Visit: Admitting: Sports Medicine

## 2024-05-16 VITALS — HR 97 | Ht 69.0 in | Wt 172.0 lb

## 2024-05-16 DIAGNOSIS — M25552 Pain in left hip: Secondary | ICD-10-CM | POA: Diagnosis not present

## 2024-05-16 DIAGNOSIS — S76312D Strain of muscle, fascia and tendon of the posterior muscle group at thigh level, left thigh, subsequent encounter: Secondary | ICD-10-CM | POA: Diagnosis not present

## 2024-05-16 MED ORDER — MELOXICAM 15 MG PO TABS: 15.0000 mg | ORAL_TABLET | ORAL | 0 refills | Status: DC | PRN

## 2024-05-16 NOTE — Patient Instructions (Signed)
-   Use meloxicam  15 mg daily as needed for pain.  Recommend limiting chronic NSAIDs to 1-2 doses per week to prevent long-term side effects.  Hep for hamstring and hip. Follow up in 4 weeks.

## 2024-06-01 ENCOUNTER — Other Ambulatory Visit: Payer: Self-pay | Admitting: Family Medicine

## 2024-06-01 ENCOUNTER — Other Ambulatory Visit: Payer: Self-pay | Admitting: Internal Medicine

## 2024-06-01 ENCOUNTER — Other Ambulatory Visit: Payer: Self-pay | Admitting: Sports Medicine

## 2024-06-01 DIAGNOSIS — J301 Allergic rhinitis due to pollen: Secondary | ICD-10-CM

## 2024-06-12 NOTE — Progress Notes (Unsigned)
 [name] [address] [place] [address] Phone: [phone]   Assessment and Plan:     1. Left hip pain (Primary) 2. Hamstring strain, left, subsequent encounter -Chronic with exacerbation, subsequent visit - Overall significant improvement in posterior left hip pain consistent with resolving proximal hamstring tendinitis with course of HEP, as needed meloxicam  use - Patient previously had improvement with proximal hamstring tendon CSI - Use Tylenol 500 to 1000 mg tablets 2-3 times a day for day-to-day pain relief - Use meloxicam  15 mg daily as needed for pain.  Recommend limiting chronic NSAIDs to 1-2 doses per week to prevent long-term side effects. - Continue HEP    Pertinent previous records reviewed include none   Follow Up: As needed if no improvement or worsening of symptoms.  Could consider repeating meloxicam  course versus physical therapy versus repeat CSI.  Could eventually consider MRI, however patient would need open MRI due to claustrophobia   Subjective:   I, Austin [name], am serving as a Neurosurgeon for Doctor Austin [name]   Chief Complaint: L hip pain    HPI:    12/22/2023 Austin [name] is a 83 y.o. male who presents to [facility] at [facility] today for exacerbation of his L hip pain. Pt was last seen by Austin [name] on 09/07/23 and was given a L ischial tuberosity steroid injection.   Today, pt reports last steroid injection was a little helpful. He has been working [facility] at the senior center and overdid it about a wk ago. Pt locates pain to the posterior aspect of his L thigh and L buttock.    Dx imaging: 08/09/23 L hip XR 04/04/23 L-spine XR   Pertinent review of systems: No fevers or chills   Relevant historical information: Hypertension asthma/COPD.  History of prostate cancer.     Exam:  BP (!) 158/80   Pulse 98   Ht 5' 9 (1.753 m)   Wt 179 lb (81.2 kg)   SpO2 97%   BMI  26.43 kg/m  General: Well Developed, well nourished, and in no acute distress.    MSK: Left ischial tuberosity tender palpation.  Pain with resisted hip extension and knee flexion.   05/16/2024 Patient states he is doing pretty well. Intermittent ibu use. Wife says he uses a lot . R side is doing well as well . Wants to discuss CSI.    06/13/2024 Patient states HEP has been helping    Relevant Historical Information: Hypertension, GERD, history of prostate cancer  Additional pertinent review of systems negative.   Current Outpatient Medications:    albuterol  (PROVENTIL ) (2.5 MG/3ML) 0.083% nebulizer solution, USE 1 VIAL BY NEBULIZATION EVERY 4 HOURS AS NEEDED FOR WHEEZING OR SHORTNESS OF BREATH/DYSPNEA. J45.998, Disp: 375 mL, Rfl: 1   albuterol  (VENTOLIN  HFA) 108 (90 Base) MCG/ACT inhaler, INHALE 1-2 PUFFS BY MOUTH EVERY 6 HOURS AS NEEDED FOR WHEEZE OR SHORTNESS OF BREATH, Disp: 18 each, Rfl: 5   amLODipine  (NORVASC ) 5 MG tablet, TAKE 1 TABLET (5 MG TOTAL) BY MOUTH DAILY., Disp: 90 tablet, Rfl: 3   atorvastatin  (LIPITOR) 80 MG tablet, TAKE 1 TABLET BY MOUTH EVERY DAY, Disp: 90 tablet, Rfl: 1   baclofen  (LIORESAL ) 10 MG tablet, Take 0.5-1 tablets (5-10 mg total) by mouth 4 (four) times daily as needed for muscle spasms., Disp: 30 each, Rfl: 0   budeson-glycopyrrolate-formoterol (BREZTRI  AEROSPHERE) 160-9-4.8 MCG/ACT AERO, Inhale 2 puffs into the lungs in the morning and  at bedtime., Disp: 10.7 each, Rfl: 5   buPROPion  (WELLBUTRIN  XL) 150 MG 24 hr tablet, Take 1 tablet (150 mg total) by mouth daily., Disp: 90 tablet, Rfl: 3   Cyanocobalamin  (VITAMIN B 12 PO), Take 1,000 Units by mouth., Disp: , Rfl:    fenofibrate  micronized (LOFIBRA) 200 MG capsule, TAKE 1 CAPSULE BY MOUTH EVERY DAY, Disp: 90 capsule, Rfl: 0   gabapentin  (NEURONTIN ) 100 MG capsule, TAKE 1 CAPSULE BY MOUTH EVERYDAY AT BEDTIME, Disp: 30 capsule, Rfl: 0   glucose blood (FREESTYLE LITE) test strip, CHECK BLOOD SUGAR THREE TIMES A  DAY E11.9, Disp: 100 strip, Rfl: 12   ibuprofen  (ADVIL ) 800 MG tablet, TAKE 1 TABLET BY MOUTH EVERY 8 HOURS AS NEEDED, Disp: 30 tablet, Rfl: 0   Lancets Misc. (UNISTIK 2 EXTRA) MISC, Use to check blood sugar 3 times daily/ Dx E11.9, Disp: 200 each, Rfl: 0   loratadine  (CLARITIN ) 10 MG tablet, TAKE 1 TABLET BY MOUTH EVERY DAY, Disp: 90 tablet, Rfl: 3   losartan  (COZAAR ) 100 MG tablet, TAKE 1 TABLET BY MOUTH EVERY DAY, Disp: 90 tablet, Rfl: 3   magnesium oxide (MAG-OX) 400 (240 Mg) MG tablet, TAKE 1 TABLET BY MOUTH EVERY DAY, Disp: 90 tablet, Rfl: 3   meloxicam  (MOBIC ) 15 MG tablet, Take 1 tablet (15 mg total) by mouth as needed for pain., Disp: 30 tablet, Rfl: 2   montelukast  (SINGULAIR ) 10 MG tablet, TAKE 1 TABLET BY MOUTH EVERYDAY AT BEDTIME, Disp: 90 tablet, Rfl: 2   nitrofurantoin, macrocrystal-monohydrate, (MACROBID) 100 MG capsule, Take 100 mg by mouth daily., Disp: , Rfl:    omeprazole  (PRILOSEC) 20 MG capsule, TAKE 2 CAPSULES BY MOUTH EVERY DAY, Disp: 180 capsule, Rfl: 1   solifenacin (VESICARE) 5 MG tablet, Take 5 mg by mouth daily., Disp: , Rfl:    Spacer/Aero-Holding Chambers DEVI, 1 each by Does not apply route as directed., Disp: 1 each, Rfl: 1   Vitamin D , Cholecalciferol, 50 MCG (2000 UT) CAPS, Take by mouth., Disp: , Rfl:    Objective:     Vitals:   06/13/24 1038  Pulse: (!) 104  SpO2: 96%  Weight: 174 lb (78.9 kg)  Height: 5' 9 (1.753 m)      Body mass index is 25.7 kg/m.    Physical Exam:     General: awake, alert, and oriented no acute distress, nontoxic Skin: no suspicious lesions or rashes Neuro:sensation intact distally with no deficits, normal muscle tone, no atrophy, strength 5/5 in all tested lower ext groups Psych: normal mood and affect, speech clear   Left hip: No deformity, swelling or wasting ROM Flexion 90, ext 20, IR 35, ER 40 TTP minimal gluteal musculature, hamstring origin NTTP over the hip flexors, greater trochanter,  , si joint, lumbar  spine Negative log roll with FROM Negative FABER Negative FADIR Negative Piriformis test Gait normal     Electronically signed by:  Austin [name] D.CLEMENTEEN [name] 11:04 AM 06/13/24

## 2024-06-13 ENCOUNTER — Ambulatory Visit (INDEPENDENT_AMBULATORY_CARE_PROVIDER_SITE_OTHER): Admitting: Sports Medicine

## 2024-06-13 VITALS — HR 104 | Ht 69.0 in | Wt 174.0 lb

## 2024-06-13 DIAGNOSIS — S76312D Strain of muscle, fascia and tendon of the posterior muscle group at thigh level, left thigh, subsequent encounter: Secondary | ICD-10-CM | POA: Diagnosis not present

## 2024-06-13 DIAGNOSIS — M25552 Pain in left hip: Secondary | ICD-10-CM

## 2024-06-13 MED ORDER — MELOXICAM 15 MG PO TABS: 15.0000 mg | ORAL_TABLET | ORAL | 2 refills | Status: DC | PRN

## 2024-06-13 NOTE — Patient Instructions (Signed)
-   Use meloxicam  15 mg daily as needed for pain.  Recommend limiting chronic NSAIDs to 1-2 doses per week to prevent long-term side effects.  Refill meloxicam    Continue HEP  As needed follow up if you need future refills call and ask for a refill

## 2024-06-29 ENCOUNTER — Telehealth: Payer: Self-pay

## 2024-06-29 DIAGNOSIS — J449 Chronic obstructive pulmonary disease, unspecified: Secondary | ICD-10-CM

## 2024-06-29 MED ORDER — SPACER/AERO-HOLDING CHAMBERS DEVI: 1.0000 | 0 refills | Status: DC

## 2024-06-29 NOTE — Telephone Encounter (Signed)
 Copied from CRM #[id number]. Topic: Clinical - Prescription Issue >> Jun 27, 2024  2:42 PM Isabell A wrote: Reason for CRM: Patient states his inhaler extension has broken & is requesting a prescription sent to [facility] [address] [place], [place] [address]   Callback number: [phone]   ATC x1. No answer, left detailed vm stating I am sending spacer to preferred pharmacy.

## 2024-07-09 ENCOUNTER — Ambulatory Visit (INDEPENDENT_AMBULATORY_CARE_PROVIDER_SITE_OTHER): Admitting: Family Medicine

## 2024-07-09 ENCOUNTER — Ambulatory Visit: Admitting: Family Medicine

## 2024-07-09 ENCOUNTER — Encounter: Payer: Self-pay | Admitting: Family Medicine

## 2024-07-09 VITALS — BP 136/78 | HR 89 | Temp 97.5°F | Ht 69.0 in | Wt 168.8 lb

## 2024-07-09 DIAGNOSIS — D692 Other nonthrombocytopenic purpura: Secondary | ICD-10-CM | POA: Insufficient documentation

## 2024-07-09 DIAGNOSIS — F321 Major depressive disorder, single episode, moderate: Secondary | ICD-10-CM | POA: Diagnosis not present

## 2024-07-09 DIAGNOSIS — E785 Hyperlipidemia, unspecified: Secondary | ICD-10-CM | POA: Diagnosis not present

## 2024-07-09 DIAGNOSIS — E559 Vitamin D deficiency, unspecified: Secondary | ICD-10-CM | POA: Diagnosis not present

## 2024-07-09 DIAGNOSIS — R739 Hyperglycemia, unspecified: Secondary | ICD-10-CM | POA: Diagnosis not present

## 2024-07-09 DIAGNOSIS — R600 Localized edema: Secondary | ICD-10-CM | POA: Diagnosis not present

## 2024-07-09 DIAGNOSIS — Z23 Encounter for immunization: Secondary | ICD-10-CM

## 2024-07-09 DIAGNOSIS — E538 Deficiency of other specified B group vitamins: Secondary | ICD-10-CM

## 2024-07-09 DIAGNOSIS — I10 Essential (primary) hypertension: Secondary | ICD-10-CM

## 2024-07-09 MED ORDER — BUPROPION HCL ER (XL) 300 MG PO TB24: 300.0000 mg | ORAL_TABLET | Freq: Every day | ORAL | 3 refills | Status: DC

## 2024-07-09 NOTE — Assessment & Plan Note (Signed)
 No red flags.  Likely secondary to venous insufficiency.  Does have a few scattered varicosities on lower extremities as well.  Also on amlodipine  5 mg daily which is contributing.  We discussed trial off amlodipine  however he declined.  We discussed conservative measures including leg elevation and salt avoidance.  He will let us  know if symptoms become more bothersome though he declined make any changes today.

## 2024-07-09 NOTE — Progress Notes (Signed)
 Austin [name] is a 83 y.o. male who presents today for an office visit.  Assessment/Plan:  New/Acute Problems: Left shoulder pain Concern for possible rotator cuff tendinopathy.  We discussed conservative measures.  Will start meloxicam  for the next 1 to 2 weeks.  Discussed home exercise program and handout was given.  He will follow-up with sports medicine if not improving in the next couple of weeks.  Chronic Problems Addressed Today: Leg edema No red flags.  Likely secondary to venous insufficiency.  Does have a few scattered varicosities on lower extremities as well.  Also on amlodipine  5 mg daily which is contributing.  We discussed trial off amlodipine  however he declined.  We discussed conservative measures including leg elevation and salt avoidance.  He will let us  know if symptoms become more bothersome though he declined make any changes today.  Essential hypertension Blood sugar at goal today on current regimen amlodipine  5 mg daily losartan  100 mg daily.  As above we did discuss switching his amlodipine  to alternative due to his leg swelling however he declined.  Senile purpura No red flags.  Reassured patient.  Likely secondary to NSAID use.  Depression, major, single episode, moderate (HCC) Symptoms are not fully well-controlled.  Mildly elevated PHQ score today.  No SI or HI.  Will increase his Wellbutrin  to 300 mg daily.  Tolerating his current dose well.  We discussed potential side effects.  He will follow-up with us  in a few weeks via MyChart.  Hyperglycemia Check A1c with labs.  Dyslipidemia Check lipids.  Vitamin D  deficiency Check vitamin D .  Vitamin B12 deficiency Check B12.  Flu vaccine given today.    Subjective:  HPI:  See assessment / plan for status of chronic conditions.   Discussed the use of AI scribe software for clinical note transcription with the patient, who gave verbal consent to proceed.  History of Present Illness Austin [name]  is an 83 year old male who presents with swollen feet and shoulder pain.  He has swelling in his feet, more pronounced in the left foot, which he attributes to the hot weather. The left foot has always been slightly larger than the right. The swelling is not particularly bothersome at this time. He is currently taking amlodipine  for blood pressure. He also has varicose veins.  He experiences pain in his left shoulder, which began approximately two weeks ago. The pain prevents him from sleeping on his left side, and he describes himself as a side sleeper. 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 He has tried ibuprofen  with minimal relief and has not yet used meloxicam  for this issue, although he has it prescribed for hip pain. No significant injury to the shoulder is reported.  He mentions feeling depressed. He has been on Wellbutrin  since the start of the year and is currently taking a low dose daily. He has previously been on Lexapro  and Celexa  but discontinued them.     01/05/2024   11:12 AM 12/06/2023   11:05 AM 10/27/2023    2:57 PM 10/25/2023    3:52 PM 09/27/2023   11:19 AM  Depression screen PHQ 2/9  Decreased Interest 0 0 2 2 0  Down, Depressed, Hopeless 0 0 1 1 1   PHQ - 2 Score 0 0 3 3 1   Altered sleeping   0 0   Tired, decreased energy   0 2   Change in appetite   0 0   Feeling  bad or failure about yourself    1 1   Trouble concentrating   0 2   Moving slowly or fidgety/restless   0 0   Suicidal thoughts   0 1   PHQ-9 Score   4 9   Difficult doing work/chores   Somewhat difficult Somewhat difficult           Objective:  Physical Exam: BP 136/78   Pulse 89   Temp (!) 97.5 F (36.4 C) (Temporal)   Ht 5' 9 (1.753 m)   Wt 168 lb 12.8 oz (76.6 kg)   SpO2 95%   BMI 24.93 kg/m   Gen: No acute distress, resting comfortably CV: Regular rate and rhythm with no murmurs appreciated Pulm: Normal work of breathing, clear to  auscultation bilaterally with no crackles, wheezes, or rhonchi MUSCULOSKELETAL - Left Arm: No deformities.  Tender to palpation along superior and anterior edges.  Pain elicited with resisted supraspinatus testing.  Neurovascular intact distally - Legs: No deformities.  2+ pitting edema bilaterally.  Scattered varicosities noted bilateral lower extremities. Skin: Senile purpura noted on upper extremities. Neuro: Grossly normal, moves all extremities Psych: Normal affect and thought content      Eyla Tallon M. Kennyth, MD 07/09/2024 2:43 PM

## 2024-07-09 NOTE — Assessment & Plan Note (Signed)
 No red flags.  Reassured patient.  Likely secondary to NSAID use.

## 2024-07-09 NOTE — Assessment & Plan Note (Signed)
 Check B12

## 2024-07-09 NOTE — Assessment & Plan Note (Signed)
 Check vitamin D.

## 2024-07-09 NOTE — Assessment & Plan Note (Signed)
 Check lipids

## 2024-07-09 NOTE — Patient Instructions (Signed)
 It was very nice to see you today!  VISIT SUMMARY: During your visit, we addressed your swollen feet, shoulder pain, and feelings of depression. We also discussed your blood pressure management and administered a high-dose flu vaccine.  YOUR PLAN: LEFT SHOULDER PAIN: You have acute pain in your left shoulder, likely due to rotator cuff syndrome, which is worsened by movement. -Take meloxicam  daily for two weeks. -Perform prescribed shoulder exercises. -If there is no improvement after two weeks, you will be referred to Dr. Joane.  PERIPHERAL EDEMA: You have swelling in your feet, likely due to venous insufficiency and your blood pressure medication, amlodipine . -Monitor the swelling in your feet. -Consider a medication change if symptoms worsen. -Elevate your legs and reduce sodium intake.  DEPRESSION: You are experiencing chronic depression with a suboptimal response to your current medication. -Increase Wellbutrin  to 300 mg daily.  ESSENTIAL HYPERTENSION: Your high blood pressure is being managed with amlodipine  and losartan . -Continue taking your current medications as prescribed.  GENERAL HEALTH MAINTENANCE: You received a high-dose flu vaccine appropriate for your age. -You have been given a high-dose influenza vaccine.  Return in about 6 months (around 01/06/2025) for Follow Up.   Take care, Dr Kennyth  PLEASE NOTE:  If you had any lab tests, please let us  know if you have not heard back within a few days. You may see your results on mychart before we have a chance to review them but we will give you a call once they are reviewed by us .   If we ordered any referrals today, please let us  know if you have not heard from their office within the next week.   If you had any urgent prescriptions sent in today, please check with the pharmacy within an hour of our visit to make sure the prescription was transmitted appropriately.   Please try these tips to maintain a healthy  lifestyle:  Eat at least 3 REAL meals and 1-2 snacks per day.  Aim for no more than 5 hours between eating.  If you eat breakfast, please do so within one hour of getting up.   Each meal should contain half fruits/vegetables, one quarter protein, and one quarter carbs (no bigger than a computer mouse)  Cut down on sweet beverages. This includes juice, soda, and sweet tea.   Drink at least 1 glass of water with each meal and aim for at least 8 glasses per day  Exercise at least 150 minutes every week.

## 2024-07-09 NOTE — Assessment & Plan Note (Signed)
Check A1c with labs. 

## 2024-07-09 NOTE — Assessment & Plan Note (Signed)
 Blood sugar at goal today on current regimen amlodipine  5 mg daily losartan  100 mg daily.  As above we did discuss switching his amlodipine  to alternative due to his leg swelling however he declined.

## 2024-07-09 NOTE — Assessment & Plan Note (Signed)
 Symptoms are not fully well-controlled.  Mildly elevated PHQ score today.  No SI or HI.  Will increase his Wellbutrin  to 300 mg daily.  Tolerating his current dose well.  We discussed potential side effects.  He will follow-up with us  in a few weeks via MyChart.

## 2024-07-10 ENCOUNTER — Ambulatory Visit: Payer: Self-pay | Admitting: Family Medicine

## 2024-07-10 LAB — LIPID PANEL
Cholesterol: 137 mg/dL (ref 0–200)
HDL: 51.4 mg/dL (ref >39.00)
LDL Cholesterol: 73 mg/dL (ref 0–99)
NonHDL: 85.68
Total CHOL/HDL Ratio: 3
Triglycerides: 64 mg/dL (ref 0.0–149.0)
VLDL: 12.8 mg/dL (ref 0.0–40.0)

## 2024-07-10 LAB — TSH: TSH: 2.07 u[IU]/mL (ref 0.35–5.50)

## 2024-07-10 LAB — COMPREHENSIVE METABOLIC PANEL WITH GFR
ALT: 52 U/L (ref 0–53)
AST: 47 U/L — ABNORMAL HIGH (ref 0–37)
Albumin: 4 g/dL (ref 3.5–5.2)
Alkaline Phosphatase: 78 U/L (ref 39–117)
BUN: 12 mg/dL (ref 6–23)
CO2: 30 meq/L (ref 19–32)
Calcium: 9.7 mg/dL (ref 8.4–10.5)
Chloride: 103 meq/L (ref 96–112)
Creatinine, Ser: 0.58 mg/dL (ref 0.40–1.50)
GFR: 90.13 mL/min (ref >60.00)
Glucose, Bld: 78 mg/dL (ref 70–99)
Potassium: 4.2 meq/L (ref 3.5–5.1)
Sodium: 141 meq/L (ref 135–145)
Total Bilirubin: 0.6 mg/dL (ref 0.2–1.2)
Total Protein: 7.4 g/dL (ref 6.0–8.3)

## 2024-07-10 LAB — VITAMIN B12: Vitamin B-12: 996 pg/mL — ABNORMAL HIGH (ref 211–911)

## 2024-07-10 LAB — VITAMIN D 25 HYDROXY (VIT D DEFICIENCY, FRACTURES): VITD: 42.64 ng/mL (ref 30.00–100.00)

## 2024-07-10 LAB — CBC
HCT: 42.8 % (ref 39.0–52.0)
Hemoglobin: 14.3 g/dL (ref 13.0–17.0)
MCHC: 33.5 g/dL (ref 30.0–36.0)
MCV: 97.5 fl (ref 78.0–100.0)
Platelets: 248 K/uL (ref 150.0–400.0)
RBC: 4.38 Mil/uL (ref 4.22–5.81)
RDW: 14 % (ref 11.5–15.5)
WBC: 6.5 K/uL (ref 4.0–10.5)

## 2024-07-10 LAB — HEMOGLOBIN A1C: Hgb A1c MFr Bld: 6 % (ref 4.6–6.5)

## 2024-07-10 NOTE — Progress Notes (Signed)
 His labs are all stable.  Liver numbers are better than the last time that we checked.  His A1c is stable at 6.0.  Everything else is at goal.  Do not need to make any changes to treatment plan at this time.  He should keep up the great work and we can recheck in 6 months.

## 2024-07-17 ENCOUNTER — Other Ambulatory Visit: Payer: Self-pay

## 2024-07-17 ENCOUNTER — Ambulatory Visit: Admitting: Family Medicine

## 2024-07-17 VITALS — BP 140/80 | HR 108 | Ht 69.0 in | Wt 169.2 lb

## 2024-07-17 DIAGNOSIS — M25552 Pain in left hip: Secondary | ICD-10-CM

## 2024-07-17 MED ORDER — AMBULATORY NON FORMULARY MEDICATION: 0 refills | Status: DC

## 2024-07-17 NOTE — Progress Notes (Signed)
 I, Claretha Schimke am a scribe for Dr. [name], MD.  Austin [name] is a 83 y.o. male who presents to [facility] at [facility] today for R< L hip pain. Pt was previously seen by Dr. Leonce on 06/13/24 for L posterior hip pain do to a proximal hamstring strain.   Last L ischial tuberosity steroid injection, 12/22/23  Today, pt reports that the left hip hurts worse than the right hip at the moment. The right hip is quiet today. No injury to cause the pain. Pain doesn't wake him up at night. Took Ibuprofen  for pain. He was walking normally and tried to pick and something just wasn't right after. Would like injection in left hip today.   Dx imaging: 08/09/23 L hip XR 04/04/23 L-spine XR  Pertinent review of systems: No fevers or chills  Relevant historical information: Bilateral hip pain in the past   Exam:  BP (!) 140/80   Pulse (!) 108   Ht 5' 9 (1.753 m)   Wt 169 lb 3.2 oz (76.7 kg)   SpO2 98%   BMI 24.99 kg/m  General: Well Developed, well nourished, and in no acute distress.   MSK: Left hip tender palpation posterior aspect of greater trochanter.  Hip abduction and extension strength is painful    Lab and Radiology Results  Procedure: Real-time Ultrasound Guided Injection of left hip posterior aspect greater trochanter bursa Device: Philips Affiniti 50G/GE Logiq Images permanently stored and available for review in PACS Verbal informed consent obtained.  Discussed risks and benefits of procedure. Warned about infection, bleeding, hyperglycemia damage to structures among others. Patient expresses understanding and agreement Time-out conducted.   Noted no overlying erythema, induration, or other signs of local infection.   Skin prepped in a sterile fashion.   Local anesthesia: Topical Ethyl chloride.   With sterile technique and under real time ultrasound guidance: 40 mg of Kenalog  and 2 mg of lidocaine injected into posterior aspect greater  trochanter. Fluid seen entering the bursa.   Completed without difficulty   Pain moderately immediately resolved suggesting accurate placement of the medication.   Advised to call if fevers/chills, erythema, induration, drainage, or persistent bleeding.   Images permanently stored and available for review in the ultrasound unit.  Impression: Technically successful ultrasound guided injection.         Assessment and Plan: 83 y.o. male with left posterior hip pain.  This is a continuation of a previous problem.  The pain today is more located at the posterior aspect of the greater trochanter than at the ischial tuberosity.  Plan for injection.  Continue home exercise program.  If not improving or if worsening will likely proceed to MRI.  He is limping badly enough today that a walker would be helpful to prevent risk of infection.   PDMP not reviewed this encounter. Orders Placed This Encounter  Procedures   US  LIMITED JOINT SPACE STRUCTURES LOW LEFT(NO LINKED CHARGES)    Reason for Exam (SYMPTOM  OR DIAGNOSIS REQUIRED):   hip pain    Preferred imaging location?:   [facility]   Meds ordered this encounter  Medications   AMBULATORY NON FORMULARY MEDICATION    Sig: walker Dispense 1 Dx code: M63.552 Use as needed    Dispense:  1 Device    Refill:  0     Discussed warning signs or symptoms. Please see discharge instructions. Patient expresses understanding.   The above documentation has been reviewed and is  accurate and complete [name], M.D.

## 2024-07-17 NOTE — Patient Instructions (Addendum)
 You received an injection today. Seek immediate medical attention if the joint becomes red, extremely painful, or is oozing fluid.  Please go to [facility] supply to get the Vannie we talked about today. You may also be able to get it from [employer].    [facility] Address: [address] [place], [place] [address] Phone: [phone]  Check back as needed.  If not better, we can order an MRI.

## 2024-07-20 ENCOUNTER — Telehealth: Payer: Self-pay | Admitting: Family Medicine

## 2024-07-20 DIAGNOSIS — M25552 Pain in left hip: Secondary | ICD-10-CM

## 2024-07-20 MED ORDER — HYDROCODONE-ACETAMINOPHEN 5-325 MG PO TABS: 1.0000 | ORAL_TABLET | Freq: Four times a day (QID) | ORAL | 0 refills | Status: DC | PRN

## 2024-07-20 NOTE — Telephone Encounter (Signed)
 Hydrocodone prescribed.  MRI ordered.

## 2024-07-20 NOTE — Telephone Encounter (Signed)
 Pt called , would like to proceed with MRI discussed at last visit. Is concerned about size of the MRI machine.  Also, [facility] [place]

## 2024-07-20 NOTE — Telephone Encounter (Signed)
 Dr. Joane, confirmed order - MRI Left Hip w/o CM? Also, pt requesting medication for pain.    Per visit note 07/17/24:  Assessment and Plan: 83 y.o. male with left posterior hip pain.  This is a continuation of a previous problem.  The pain today is more located at the posterior aspect of the greater trochanter than at the ischial tuberosity.  Plan for injection.  Continue home exercise program.  If not improving or if worsening will likely proceed to MRI.   He is limping badly enough today that a walker would be helpful to prevent risk of infection.

## 2024-07-20 NOTE — Telephone Encounter (Signed)
 Pt requesting OPEN MRI due to severe claustrophobia , has talked with [facility] and not comfortable with the size of their machine. They recommended the hospital, but not sure if that is open.  Pt informed rx called in.

## 2024-07-23 ENCOUNTER — Telehealth: Payer: Self-pay | Admitting: Family Medicine

## 2024-07-23 NOTE — Telephone Encounter (Signed)
 Looks like pt is schedule with [facility] on 08/07/24.   Called pt, left VM to call the office.

## 2024-07-23 NOTE — Telephone Encounter (Signed)
 Forwarding to Dr. Alease Hunter to prescribe.

## 2024-07-23 NOTE — Telephone Encounter (Signed)
 Called and spoke with patient, he is going to keep scheduled appointment with [facility].   Would like something called in to take before the Mri d/t claustrophobia.   Forwarding to Dr. Joane.

## 2024-07-23 NOTE — Telephone Encounter (Signed)
 Pt requesting refill of pain meds, given 4 day supply on Friday and has 2 left.  Would like to continue taking these until he gets through his MRI.

## 2024-07-24 MED ORDER — HYDROCODONE-ACETAMINOPHEN 5-325 MG PO TABS: 1.0000 | ORAL_TABLET | Freq: Four times a day (QID) | ORAL | 0 refills | Status: DC | PRN

## 2024-07-24 MED ORDER — LORAZEPAM 0.5 MG PO TABS: ORAL_TABLET | ORAL | 0 refills | Status: DC

## 2024-07-24 NOTE — Telephone Encounter (Signed)
 I refilled pain medication.  The MRI machine at [facility] in [place] is to my understanding the largest diameter MRI available in the area.  They have a newer machine that is a lot bigger.  Are you aware of a larger MRI?  It may be difficult to get you 1 sooner than the 28th at a different location.

## 2024-07-24 NOTE — Telephone Encounter (Signed)
 Called pt, left VM advising that rx has been sent in for med to take prior to procedure.

## 2024-07-24 NOTE — Telephone Encounter (Addendum)
 Called pt, left VM advising that Rx has been sent to the pharmacy to take prior to procedure and to contact the office with questions or concerns.   There are multiple encounters regarding the same question - pt spoke with [facility] about size of MRI machine and has schedule appointment for 08/07/24. Dr. Joane sent in Ativan to take prior to MRI for Claustrophobia.   Closing encounter.   Per prior encounter:      07/23/24  2:23 PM Note Called and spoke with patient, he is going to keep scheduled appointment with [facility].    Would like something called in to take before the Mri d/t claustrophobia.    Forwarding to Dr. Joane.

## 2024-07-24 NOTE — Telephone Encounter (Signed)
 Austin [name] [facility]   07/24/24 10:05 AM Note Patient called back to follow up.   [facility] [place]

## 2024-07-24 NOTE — Telephone Encounter (Signed)
 Ativan prescribed.

## 2024-07-24 NOTE — Telephone Encounter (Signed)
 Pt left after-hours voicemail about pain medicine again.  Please call patient. May want to confirm his MRI scheduling at [facility] as he previously stated he would NOT be able to go there based on size of the machine.

## 2024-07-24 NOTE — Telephone Encounter (Signed)
 Patient called back to follow up.  [facility] [place]

## 2024-07-24 NOTE — Telephone Encounter (Signed)
 Message sent to Dr. Joane through a different telephone encounter.

## 2024-07-25 ENCOUNTER — Telehealth: Payer: Self-pay | Admitting: Family Medicine

## 2024-07-25 NOTE — Telephone Encounter (Signed)
 Forwarding to Dr. [name] to review and advise.

## 2024-07-25 NOTE — Telephone Encounter (Signed)
 Patient called and stated that his pain is moving over to the right side now and he would like to know what to do. He also stated that the pills are making him nauseous. Please advise.

## 2024-07-27 NOTE — Telephone Encounter (Signed)
 The hip MRI will look at both hips to some degree.  Opiate pain medications can certainly cause nausea.

## 2024-07-30 NOTE — Telephone Encounter (Signed)
 Called pt at [phone], left VM to call the office or check message via MyChart.

## 2024-07-31 NOTE — Telephone Encounter (Signed)
 Pt returned call and was provided Dr. Virgilio advise. Also asked about other pain medication. Advised that Tylenol Arthritis would be his safest option. Pt verbalized understanding and confirmed MRI appointment.

## 2024-07-31 NOTE — Telephone Encounter (Signed)
 Pt returned call and left voicemail after we had closed. Please return patient call.

## 2024-08-06 ENCOUNTER — Telehealth: Payer: Self-pay | Admitting: Family Medicine

## 2024-08-06 NOTE — Telephone Encounter (Signed)
 Patient's wife [name] called and stated that the patient is very sick on the pain pill he is on and she is wondering if he can get oxycodone instead. Please advise.

## 2024-08-07 ENCOUNTER — Ambulatory Visit
Admission: RE | Admit: 2024-08-07 | Discharge: 2024-08-07 | Disposition: A | Source: Ambulatory Visit | Attending: Family Medicine | Admitting: Family Medicine

## 2024-08-07 DIAGNOSIS — M25552 Pain in left hip: Secondary | ICD-10-CM

## 2024-08-07 MED ORDER — OXYCODONE-ACETAMINOPHEN 5-325 MG PO TABS: 1.0000 | ORAL_TABLET | Freq: Three times a day (TID) | ORAL | 0 refills | Status: DC | PRN

## 2024-08-07 NOTE — Telephone Encounter (Signed)
Oxycodone prescribed

## 2024-08-13 ENCOUNTER — Other Ambulatory Visit (HOSPITAL_COMMUNITY): Payer: Self-pay | Admitting: Urology

## 2024-08-13 ENCOUNTER — Ambulatory Visit: Payer: Self-pay | Admitting: Family Medicine

## 2024-08-13 DIAGNOSIS — C61 Malignant neoplasm of prostate: Secondary | ICD-10-CM

## 2024-08-13 NOTE — Progress Notes (Signed)
 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  You do have tendinitis and partial tearing of the gluteus medius tendon on the side of the hip which could produce pain in the side of the hip and some irritation at the hamstring origin at the back of the hip buttocks area.  Recommend return to clinic to go over the results in full detail and discuss treatment plan and options.

## 2024-08-14 ENCOUNTER — Ambulatory Visit: Admitting: Internal Medicine

## 2024-08-14 ENCOUNTER — Other Ambulatory Visit: Payer: Self-pay | Admitting: Internal Medicine

## 2024-08-14 ENCOUNTER — Encounter: Payer: Self-pay | Admitting: Internal Medicine

## 2024-08-14 VITALS — BP 138/84 | HR 99 | Ht 69.0 in | Wt 160.0 lb

## 2024-08-14 DIAGNOSIS — J4489 Other specified chronic obstructive pulmonary disease: Secondary | ICD-10-CM | POA: Diagnosis not present

## 2024-08-14 DIAGNOSIS — J45909 Unspecified asthma, uncomplicated: Secondary | ICD-10-CM | POA: Diagnosis not present

## 2024-08-14 DIAGNOSIS — J301 Allergic rhinitis due to pollen: Secondary | ICD-10-CM

## 2024-08-14 DIAGNOSIS — B370 Candidal stomatitis: Secondary | ICD-10-CM

## 2024-08-14 DIAGNOSIS — R490 Dysphonia: Secondary | ICD-10-CM

## 2024-08-14 DIAGNOSIS — J310 Chronic rhinitis: Secondary | ICD-10-CM

## 2024-08-14 MED ORDER — FLUCONAZOLE 200 MG PO TABS: 200.0000 mg | ORAL_TABLET | Freq: Every day | ORAL | 0 refills | Status: DC

## 2024-08-14 MED ORDER — ADVAIR HFA 45-21 MCG/ACT IN AERO: 2.0000 | INHALATION_SPRAY | Freq: Two times a day (BID) | RESPIRATORY_TRACT | 11 refills | Status: DC

## 2024-08-14 NOTE — Patient Instructions (Addendum)
 It was a pleasure to see you today!  Please schedule follow up with myself in 4 weeks.  If my schedule is not open yet, we will contact you with a reminder closer to that time. Please call [phone] if you haven't heard from us  a month before, and always call us  sooner if issues or concerns arise. You can also send us  a message through MyChart, but but aware that this is not to be used for urgent issues and it may take up to 5-7 days to receive a reply. Please be aware that you will likely be able to view your results before I have a chance to respond to them. Please give us  5 business days to respond to any non-urgent results.   Glad the breathing has been ok.  You have thrush in your mouth and a lot of irritation that is made worse by dry mouth. I think that is why you have a hoarse voice.   Take fluconazole for the thrush one pill once a day for 7 days.   Stop the breztri  I am going to lower the steroid to low dose advair  hfa 45 2 puffs twice a day. Use with the spacer. Gargle every time you use any steroid inhaler.   Stop the claritin  which is giving you dry mouth. Continue the singulair  for allergies and asthma.   Continue the acid reflux medicine twice a day  Continue your regular exercise routine; I think this is helping your lungs and longevity.

## 2024-08-14 NOTE — Progress Notes (Signed)
 Austin [name]    [id number]    09-04-1941  Primary Care Physician:Parker, Worth HERO, MD Date of Appointment: 08/14/2024 Established Patient Visit  Chief complaint:   Chief Complaint  Patient presents with   Asthma   Shortness of Breath    Patient states his breathing has gotten worse, since falling last Thursday.    HPI: Austin [name] is a 83 y.o. 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   Interval Updates: Here for asthma copd follow up. Having six months of voice hoarseness. He does not consistently gargle after breztri . Using with spacer.   Having minimal nasal drainage.  Still going to senior center to work out. Had a fall recently - now ambulating with a walker which is new.   No interval steroids for breathing in the last year.   Current Regimen: breztri  2 puffs twice a day spacer, albuterol  Asthma Triggers: seasonal allergies Exacerbations in the last year: usually once/year. Most recently nov 2022 History of hospitalization or intubation: never Hives: denies Allergy Testing: never had.  GERD: yes on prilosec. On PPI omeprazole  40 mg daily Allergic Rhinitis: yes, on nasacort  ACT:  Asthma Control Test ACT Total Score  01/10/2024  4:04 PM 21  05/12/2022  2:33 PM 17  12/18/2021 11:33 AM 18   Region 2 allergy panel and cbc with diff showed ige and eos not elevated.  FeNO: 40 ppb  I have reviewed the patient's family social and past medical history and updated as appropriate.   Past Medical History:  Diagnosis Date   Asthma    Depression    Hypertension    Prostate cancer [facility])    Urinary incontinence     Past Surgical History:  Procedure Laterality Date   GALLBLADDER SURGERY  1997   REPLACEMENT TOTAL KNEE BILATERAL  2001   2007   surgery replacement Bilateral 2005   TOTAL SHOULDER REPLACEMENT Bilateral 2000    Family History  Problem Relation Age of Onset   Diabetes Brother    Heart disease Paternal  Grandfather    Asthma Neg Hx     Social History   Occupational History   Occupation: retired  Tobacco Use   Smoking status: Former    Types: Cigars    Quit date: 10/22/2019    Years since quitting: 4.8   Smokeless tobacco: Never   Tobacco comments:    smoked occ. with son. did not tolerate it long. 12/14/19.  Vaping Use   Vaping status: Never Used  Substance and Sexual Activity   Alcohol use: Yes   Drug use: Never   Sexual activity: Not Currently     Physical Exam: Blood pressure 138/84, pulse 99, height 5' 9 (1.753 m), weight 160 lb (72.6 kg), SpO2 93%.  Gen:      No distress, well appearing, hoarse voice, muffled speech ENT: red beefy angry looking mouth and tongue. No white plaques. Dry mouth.  Lungs:   diminished, clear, no wheeze CV:        RRR  unchanged systolic murmur  Data Reviewed: Imaging: I have personally reviewed the outside hospital records including pulmonary function tests and reports of CT chest from December 2019 which demonstrates air trapping, tracheobronchomalacia, mild bibasilar atelectasis  PFTs: Spirometry completed April 2021 shows moderately severe airflow limitation with an FEV1 53% of predicted.  I reviewed his PFTs from [facility] hospital in [place]  and these also demonstrated Airflow limitation with +BD response.  Labs: Region 2 allergy panel and cbc with diff showed ige and eos not elevated.   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  You have thrush in your mouth and a lot of irritation that is made worse by dry mouth. I think that is why you have a hoarse voice.   Stop the breztri  I am going to lower the steroid to low dose advair  hfa 45 2 puffs twice a day. Use with the spacer. Gargle every time you use any steroid inhaler.   Stop the claritin  which is giving you dry mouth. Continue the singulair  for allergies and asthma.   Continue the acid reflux medicine twice a day  Continue your regular exercise routine; I think this is helping your lungs and longevity.  Return to Care: Return in about 4 weeks (around 09/11/2024) for use same day slot or split a new patient slot. Austin Verdon Gore, MD Pulmonary and Critical Care Medicine [facility] Office:[phone]

## 2024-08-15 ENCOUNTER — Other Ambulatory Visit (HOSPITAL_COMMUNITY): Payer: Self-pay

## 2024-08-15 NOTE — Telephone Encounter (Signed)
 BIV for ICS LABA please. Needs low dose and HFA if possible

## 2024-08-16 ENCOUNTER — Encounter (HOSPITAL_COMMUNITY)
Admission: RE | Admit: 2024-08-16 | Discharge: 2024-08-16 | Disposition: A | Discharge status: Home / Self Care | Source: Ambulatory Visit | Attending: Urology | Admitting: Urology

## 2024-08-16 ENCOUNTER — Ambulatory Visit (INDEPENDENT_AMBULATORY_CARE_PROVIDER_SITE_OTHER): Admitting: Family Medicine

## 2024-08-16 ENCOUNTER — Encounter: Payer: Self-pay | Admitting: Family Medicine

## 2024-08-16 ENCOUNTER — Other Ambulatory Visit: Payer: Self-pay

## 2024-08-16 VITALS — BP 130/82 | HR 98 | Ht 69.0 in

## 2024-08-16 DIAGNOSIS — C61 Malignant neoplasm of prostate: Secondary | ICD-10-CM | POA: Insufficient documentation

## 2024-08-16 DIAGNOSIS — M1612 Unilateral primary osteoarthritis, left hip: Secondary | ICD-10-CM

## 2024-08-16 DIAGNOSIS — M25552 Pain in left hip: Secondary | ICD-10-CM

## 2024-08-16 MED ORDER — FLOTUFOLASTAT F 18 GALLIUM 296-5846 MBQ/ML IV SOLN
8.7800 | Freq: Once | INTRAVENOUS | Status: AC
  Administered 2024-08-16: 8.78 via INTRAVENOUS

## 2024-08-16 NOTE — Progress Notes (Signed)
 I, Austin [name], CMA acting as a scribe for Austin Lloyd, MD.  Austin [name] is a 83 y.o. male who presents to [facility] at [facility] today for f/u L hip pain w/ MRI review. Pt was last seen by Dr. Lloyd on 07/17/24 and was given a L GT steroid injection and was advised to cont HEP.   MRI was later ordered w/ hydrocodone and Ativan.  Today, pt reports continued pain in the left hip. Ambulating with a walker today. Has concerns about dry mouth and development of slurred speech. Review MRI L hip today.   Dx imaging: 08/07/24 L hip MRI 08/09/23 L hip XR 04/04/23 L-spine XR  Pertinent review of systems: No fevers or chills  Relevant historical information: Asthma and COPD.  History of prostate cancer.  Poor functional status   Exam:  BP 130/82   Pulse 98   Ht 5' 9 (1.753 m)   SpO2 92%   BMI 23.63 kg/m  General: Well Developed, well nourished, and in no acute distress.   MSK: Left hip decreased range of motion. Gait slow using a walker to ambulate. Lungs increased respiratory rate.  Patient can complete a full sentence but has to stop to catch his breath with walking.    Lab and Radiology Results  Procedure: Real-time Ultrasound Guided Injection of left hip joint intra-articular injection anterior approach Device: Philips Affiniti 50G/GE Logiq Images permanently stored and available for review in PACS Verbal informed consent obtained.  Discussed risks and benefits of procedure. Warned about infection, bleeding, hyperglycemia damage to structures among others. Patient expresses understanding and agreement Time-out conducted.   Noted no overlying erythema, induration, or other signs of local infection.   Skin prepped in a sterile fashion.   Local anesthesia: Topical Ethyl chloride.   With sterile technique and under real time ultrasound guidance: 40 mg of Kenalog  and 2 mL of Marcaine injected into hip joint. Fluid seen entering the joint capsule.    Completed without difficulty   Pain immediately resolved suggesting accurate placement of the medication.   Advised to call if fevers/chills, erythema, induration, drainage, or persistent bleeding.   Images permanently stored and available for review in the ultrasound unit.  Impression: Technically successful ultrasound guided injection.   EXAM: MR OF THE LEFT HIP WITHOUT CONTRAST   TECHNIQUE: Multiplanar, multisequence MR imaging was performed. No intravenous contrast was administered.   COMPARISON:  X-ray 08/09/2023   FINDINGS: Bones: No acute fracture. No dislocation. No femoral head avascular necrosis. Bony pelvis intact without diastasis. Partial osseous fusion of the pubic symphysis and right sacroiliac joint. Mild bone marrow edema at the pubic symphysis. No marrow replacing bone lesion.   Articular cartilage and labrum   Articular cartilage: Moderate-severe diffuse cartilage thinning and irregularity of the left hip joint with mild reactive subchondral marrow edema. 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   Labrum:  Diffusely degenerated and torn.   Joint or bursal effusion   Joint effusion:  Small bilateral hip joint effusions.   Bursae: Trace right peritrochanteric bursal fluid.   Muscles and tendons   Muscles and tendons: Left hamstring tendon origin is attenuated in appearance, likely sequela of chronic tearing. Tendinosis and partial-thickness tearing of the right gluteus medius tendon. The left gluteal, iliopsoas, rectus femoris, and adductor tendons appear intact without tear or significant tendinosis. Normal muscle bulk and signal intensity without edema, atrophy, or fatty infiltration.   Other findings   Miscellaneous: No soft tissue edema or fluid  collection. No inguinal lymphadenopathy.   IMPRESSION: 1. Moderate-severe osteoarthritis of the left hip. 2. 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 3. Tendinosis and  partial-thickness tearing of the right gluteus medius tendon. 4. Left hamstring tendon origin is attenuated in appearance, likely sequela of chronic tearing. 5. Partial osseous fusion of the pubic symphysis and right sacroiliac joint. Mild bone marrow edema at the pubic symphysis.     Electronically Signed   By: Mabel Converse D.O.   On: 08/10/2024 08:58  I, Austin [name], personally (independently) visualized and performed the interpretation of the images attached in this note.     Assessment and Plan: 83 y.o. male with left posterior hip pain.  Patient has failed to improve with PT, hamstring ischial tuberosity injections, and lateral hip greater trochanter injections.  MRI shows multiple potential locations for pain.  Typically hip arthritis would be felt in the anterior hip and his pain is posterior.  However he had significant dramatic improvement of his overall hip pain including posterior hip pain after intra-articular injection today indicating that his primary pain generator is his hip joint.  Plan to recheck in 2 months and continue to work on improving respiratory status.  Ultimately he may require hip replacement but he needs to have enough functional capacity to thrive after hip replacement and currently he does not.   PDMP not reviewed this encounter. Orders Placed This Encounter  Procedures   US  LIMITED JOINT SPACE STRUCTURES LOW LEFT(NO LINKED CHARGES)    Reason for Exam (SYMPTOM  OR DIAGNOSIS REQUIRED):   left hip pain    Preferred imaging location?:   [facility]   No orders of the defined types were placed in this encounter.    Discussed warning signs or symptoms. Please see discharge instructions. Patient expresses understanding.   The above documentation has been reviewed and is accurate and complete Austin [name], M.D.

## 2024-08-16 NOTE — Patient Instructions (Addendum)
 Thank you for coming in today.   You received an injection today. Seek immediate medical attention if the joint becomes red, extremely painful, or is oozing fluid.   Work with Pulmonology to improve breathing  Bed assist ladder  Check back in 2 months

## 2024-08-20 ENCOUNTER — Ambulatory Visit: Payer: Self-pay

## 2024-08-20 NOTE — Telephone Encounter (Signed)
 FYI Only or Action Required?: FYI only for provider: appointment scheduled on 08/22/24.  Patient is followed in Pulmonology for Asthma-COPD, last seen on 08/14/2024 by Meade Verdon RAMAN, MD.  Called Nurse Triage reporting Shortness of Breath.  Symptoms began several months ago.  Interventions attempted: Maintenance inhaler.  Symptoms are: stable.  Triage Disposition: See PCP When Office is Open (Within 3 Days)  Patient/caregiver understands and will follow disposition?: Yes Reason for Disposition  Nursing judgment or information in reference  Answer Assessment - Initial Assessment Questions Patient called in talking about a multitude of symptoms, SOB states this has been on going, denies it worsening. Inhaler was recently switched to advair  and he is unsure if its working but has only been on it a week. States he has thrush, last day of thrush medication is today, a little bit of improvement then states no improvement. Patient mentions hands and feet shaking, but this has been going on for quite some time. When asked what patient needed today, he stated he wants an appointment again. Denied new or worsening symptoms. Assisted patient in scheduling.   1. REASON FOR CALL: What is your main concern right now?      Come in for an appointment   2. OTHER SYMPTOMS: Do you have any other new symptoms?     No  Protocols used: No Guideline Available-A-AH  Copied from CRM #[id number]. Topic: Clinical - Red Word Triage >> Aug 20, 2024  2:52 PM Devaughn RAMAN wrote: Red Word that prompted transfer to Nurse Triage: shortness of breath and shaking, new medication prescribed does not help.

## 2024-08-22 ENCOUNTER — Ambulatory Visit (INDEPENDENT_AMBULATORY_CARE_PROVIDER_SITE_OTHER): Admitting: Internal Medicine

## 2024-08-22 ENCOUNTER — Encounter: Payer: Self-pay | Admitting: Internal Medicine

## 2024-08-22 ENCOUNTER — Ambulatory Visit: Admitting: Family Medicine

## 2024-08-22 VITALS — BP 120/70 | HR 110 | Temp 97.6°F | Ht 69.0 in | Wt 153.6 lb

## 2024-08-22 DIAGNOSIS — R1312 Dysphagia, oropharyngeal phase: Secondary | ICD-10-CM

## 2024-08-22 DIAGNOSIS — R0602 Shortness of breath: Secondary | ICD-10-CM

## 2024-08-22 DIAGNOSIS — J4489 Other specified chronic obstructive pulmonary disease: Secondary | ICD-10-CM

## 2024-08-22 DIAGNOSIS — B370 Candidal stomatitis: Secondary | ICD-10-CM | POA: Diagnosis not present

## 2024-08-22 DIAGNOSIS — K219 Gastro-esophageal reflux disease without esophagitis: Secondary | ICD-10-CM

## 2024-08-22 DIAGNOSIS — R9389 Abnormal findings on diagnostic imaging of other specified body structures: Secondary | ICD-10-CM

## 2024-08-22 DIAGNOSIS — J310 Chronic rhinitis: Secondary | ICD-10-CM

## 2024-08-22 MED ORDER — FLUCONAZOLE 200 MG PO TABS: 200.0000 mg | ORAL_TABLET | Freq: Every day | ORAL | 0 refills | Status: DC

## 2024-08-22 MED ORDER — LORAZEPAM 0.5 MG PO TABS: ORAL_TABLET | ORAL | 0 refills | Status: DC

## 2024-08-22 NOTE — Patient Instructions (Signed)
 It was a pleasure to see you today!  Keep your appointment for December. Please call sooner [phone] if issues or concerns arise. You can also send us  a message through MyChart, but but aware that this is not to be used for urgent issues and it may take up to 5-7 days to receive a reply. Please be aware that you will likely be able to view your results before I have a chance to respond to them. Please give us  5 business days to respond to any non-urgent results.    Sorry to hear your breathing is worse.  We will extend your fluconazole for another week to treat your thrush which may have extended into the deeper part of your throat or esophagus.  If you are swallowing is not better with this we may need to send you to a gastroenterologist.  Because you are having worsening shortness of breath over the last several months with worsening cough and weight loss, I would like to investigate this further.  We will get a PFT which is a pulmonary function test.  We will also get a high-resolution CT scan to further evaluate some patchy changes or inflammation that was noted on your PET scan.  You can use the albuterol  up to 4 times a day and nebulizer format as needed for shortness of breath.  Continue the Asmanex with spacer since it is probably helping your asthma and COPD.

## 2024-08-22 NOTE — Progress Notes (Signed)
 Austin [name]    [id number]    Jun 25, 1941  Primary Care Physician:Parker, Worth HERO, MD Date of Appointment: 08/22/2024 Established Patient Visit  Chief complaint:   Chief Complaint  Patient presents with   Asthma   COPD    Acute visit,sob,thrush    HPI: Austin [name] is a 83 y.o. 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   Interval Updates: Here after last week's appointment where we decreased his breztri  to low dose advair  due to thrush. Treated with fluconazole. Also instructed to stop all inhalers for a few days. And then started the advair  with spacer.   He is here because of ongoing thrush despite the fluconazole. He is having trouble swallowing his pills. He feels his breathing has gotten worse in the last 6 months to year, but hasn't worsened acutely in the last week.   Wife is here with him and says he is losing weight due to not being able to eat and swallow. Has lost about 30 lbs over the last few months.   Just had a pet scan which showed no metastatic disease.  Having minimal nasal drainage.  Still going to senior center to work out. Had a fall recently - now ambulating with a walker which is new.   No interval steroids for breathing in the last year.   Current Regimen: advair  2 puffs twice a day with spacer, prn albuterol  and neb. Asthma Triggers: seasonal allergies Exacerbations in the last year: usually once/year. Most recently nov 2022 History of hospitalization or intubation: never Hives: denies Allergy Testing: never had.  GERD: yes on prilosec. On PPI omeprazole  40 mg daily Allergic Rhinitis: yes, on nasacort  ACT:  Asthma Control Test ACT Total Score  01/10/2024  4:04 PM 21  05/12/2022  2:33 PM 17  12/18/2021 11:33 AM 18   Region 2 allergy panel and cbc with diff showed ige and eos not elevated.  FeNO: 40 ppb  I have reviewed the patient's family social and past medical history and updated as appropriate.    Past Medical History:  Diagnosis Date   Asthma    Depression    Hypertension    Prostate cancer [facility])    Urinary incontinence     Past Surgical History:  Procedure Laterality Date   GALLBLADDER SURGERY  1997   REPLACEMENT TOTAL KNEE BILATERAL  2001   2007   surgery replacement Bilateral 2005   TOTAL SHOULDER REPLACEMENT Bilateral 2000    Family History  Problem Relation Age of Onset   Diabetes Brother    Heart disease Paternal Grandfather    Asthma Neg Hx     Social History   Occupational History   Occupation: retired  Tobacco Use   Smoking status: Former    Types: Cigars    Quit date: 10/22/2019    Years since quitting: 4.8   Smokeless tobacco: Never   Tobacco comments:    smoked occ. with son. did not tolerate it long. 12/14/19.  Vaping Use   Vaping status: Never Used  Substance and Sexual Activity   Alcohol use: Yes   Drug use: Never   Sexual activity: Not Currently     Physical Exam: Blood pressure 120/70, pulse (!) 110, temperature 97.6 F (36.4 C), temperature source Oral, height 5' 9 (1.753 m), weight 153 lb 9.6 oz (69.7 kg), SpO2 93%.  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  I reviewed his PFTs from [facility] hospital in [place]  and these also demonstrated Airflow limitation with +BD response.   Labs: Region 2 allergy panel and cbc with diff showed ige and eos not elevated.   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 Basilar crackles. Abnormal lung windows on recent pet scan for prostate cancer. No distant metastatic disease.   Sorry to hear your breathing is worse.  We will extend your fluconazole for another week to treat your thrush which may have extended into the deeper part of your throat or esophagus.  If you are swallowing is not better with this we may need to send you to a gastroenterologist.  Because you are having worsening shortness of breath over the last several months with worsening cough and weight loss, I would like to investigate this further.  We will get a PFT which is a pulmonary function test.  We will also get a high-resolution CT scan to further evaluate some patchy changes or inflammation that was noted on your PET scan.  You can use the albuterol  up to 4 times a day and nebulizer format as needed for shortness of breath.  Continue the Asmanex with spacer since it is probably helping your asthma and COPD.  Return to Care: Keep appointment with me in December.     Austin Gore, MD Pulmonary and Critical Care Medicine [facility] Office:[phone]

## 2024-08-29 ENCOUNTER — Inpatient Hospital Stay (HOSPITAL_COMMUNITY)
Admission: EM | Admit: 2024-08-29 | Discharge: 2024-09-06 | DRG: 056 | Disposition: A | Discharge status: Skilled Nursing Facility | Attending: Family Medicine | Admitting: Family Medicine

## 2024-08-29 ENCOUNTER — Telehealth (HOSPITAL_COMMUNITY): Payer: Self-pay

## 2024-08-29 ENCOUNTER — Emergency Department (HOSPITAL_COMMUNITY)

## 2024-08-29 ENCOUNTER — Other Ambulatory Visit: Payer: Self-pay

## 2024-08-29 ENCOUNTER — Encounter (HOSPITAL_COMMUNITY): Payer: Self-pay | Admitting: Hospitalist

## 2024-08-29 ENCOUNTER — Telehealth: Payer: Self-pay | Admitting: *Deleted

## 2024-08-29 ENCOUNTER — Ambulatory Visit (HOSPITAL_COMMUNITY)

## 2024-08-29 ENCOUNTER — Other Ambulatory Visit (HOSPITAL_COMMUNITY): Payer: Self-pay

## 2024-08-29 DIAGNOSIS — J849 Interstitial pulmonary disease, unspecified: Secondary | ICD-10-CM | POA: Insufficient documentation

## 2024-08-29 DIAGNOSIS — J470 Bronchiectasis with acute lower respiratory infection: Secondary | ICD-10-CM | POA: Diagnosis present

## 2024-08-29 DIAGNOSIS — Z79899 Other long term (current) drug therapy: Secondary | ICD-10-CM

## 2024-08-29 DIAGNOSIS — G7000 Myasthenia gravis without (acute) exacerbation: Secondary | ICD-10-CM | POA: Diagnosis not present

## 2024-08-29 DIAGNOSIS — Z96611 Presence of right artificial shoulder joint: Secondary | ICD-10-CM | POA: Diagnosis present

## 2024-08-29 DIAGNOSIS — R471 Dysarthria and anarthria: Secondary | ICD-10-CM | POA: Diagnosis present

## 2024-08-29 DIAGNOSIS — I10 Essential (primary) hypertension: Secondary | ICD-10-CM | POA: Diagnosis present

## 2024-08-29 DIAGNOSIS — Y92002 Bathroom of unspecified non-institutional (private) residence single-family (private) house as the place of occurrence of the external cause: Secondary | ICD-10-CM | POA: Diagnosis not present

## 2024-08-29 DIAGNOSIS — I951 Orthostatic hypotension: Secondary | ICD-10-CM | POA: Diagnosis present

## 2024-08-29 DIAGNOSIS — R270 Ataxia, unspecified: Secondary | ICD-10-CM | POA: Diagnosis present

## 2024-08-29 DIAGNOSIS — Z87891 Personal history of nicotine dependence: Secondary | ICD-10-CM

## 2024-08-29 DIAGNOSIS — R1310 Dysphagia, unspecified: Secondary | ICD-10-CM | POA: Diagnosis present

## 2024-08-29 DIAGNOSIS — Z515 Encounter for palliative care: Secondary | ICD-10-CM | POA: Diagnosis not present

## 2024-08-29 DIAGNOSIS — W1830XA Fall on same level, unspecified, initial encounter: Secondary | ICD-10-CM | POA: Diagnosis present

## 2024-08-29 DIAGNOSIS — Z96612 Presence of left artificial shoulder joint: Secondary | ICD-10-CM | POA: Diagnosis present

## 2024-08-29 DIAGNOSIS — E43 Unspecified severe protein-calorie malnutrition: Secondary | ICD-10-CM | POA: Diagnosis present

## 2024-08-29 DIAGNOSIS — Z789 Other specified health status: Secondary | ICD-10-CM | POA: Diagnosis not present

## 2024-08-29 DIAGNOSIS — J189 Pneumonia, unspecified organism: Secondary | ICD-10-CM | POA: Diagnosis present

## 2024-08-29 DIAGNOSIS — J439 Emphysema, unspecified: Secondary | ICD-10-CM | POA: Diagnosis present

## 2024-08-29 DIAGNOSIS — Z96653 Presence of artificial knee joint, bilateral: Secondary | ICD-10-CM | POA: Diagnosis present

## 2024-08-29 DIAGNOSIS — E785 Hyperlipidemia, unspecified: Secondary | ICD-10-CM | POA: Diagnosis present

## 2024-08-29 DIAGNOSIS — Z23 Encounter for immunization: Secondary | ICD-10-CM | POA: Diagnosis present

## 2024-08-29 DIAGNOSIS — G7001 Myasthenia gravis with (acute) exacerbation: Secondary | ICD-10-CM | POA: Diagnosis present

## 2024-08-29 DIAGNOSIS — Z7951 Long term (current) use of inhaled steroids: Secondary | ICD-10-CM

## 2024-08-29 DIAGNOSIS — F39 Unspecified mood [affective] disorder: Secondary | ICD-10-CM | POA: Diagnosis present

## 2024-08-29 DIAGNOSIS — R7989 Other specified abnormal findings of blood chemistry: Secondary | ICD-10-CM | POA: Diagnosis not present

## 2024-08-29 DIAGNOSIS — R42 Dizziness and giddiness: Secondary | ICD-10-CM | POA: Diagnosis not present

## 2024-08-29 DIAGNOSIS — F05 Delirium due to known physiological condition: Secondary | ICD-10-CM | POA: Diagnosis not present

## 2024-08-29 DIAGNOSIS — Z1152 Encounter for screening for COVID-19: Secondary | ICD-10-CM

## 2024-08-29 DIAGNOSIS — Z66 Do not resuscitate: Secondary | ICD-10-CM | POA: Diagnosis present

## 2024-08-29 DIAGNOSIS — Z6822 Body mass index (BMI) 22.0-22.9, adult: Secondary | ICD-10-CM

## 2024-08-29 DIAGNOSIS — S0101XA Laceration without foreign body of scalp, initial encounter: Secondary | ICD-10-CM | POA: Diagnosis present

## 2024-08-29 DIAGNOSIS — J4489 Other specified chronic obstructive pulmonary disease: Secondary | ICD-10-CM | POA: Diagnosis present

## 2024-08-29 DIAGNOSIS — Z8249 Family history of ischemic heart disease and other diseases of the circulatory system: Secondary | ICD-10-CM

## 2024-08-29 DIAGNOSIS — R638 Other symptoms and signs concerning food and fluid intake: Secondary | ICD-10-CM | POA: Diagnosis not present

## 2024-08-29 DIAGNOSIS — F32A Depression, unspecified: Secondary | ICD-10-CM | POA: Diagnosis present

## 2024-08-29 DIAGNOSIS — R296 Repeated falls: Secondary | ICD-10-CM | POA: Diagnosis present

## 2024-08-29 DIAGNOSIS — R0609 Other forms of dyspnea: Secondary | ICD-10-CM | POA: Diagnosis not present

## 2024-08-29 DIAGNOSIS — Z7189 Other specified counseling: Secondary | ICD-10-CM | POA: Diagnosis not present

## 2024-08-29 DIAGNOSIS — R634 Abnormal weight loss: Secondary | ICD-10-CM | POA: Insufficient documentation

## 2024-08-29 DIAGNOSIS — Z8546 Personal history of malignant neoplasm of prostate: Secondary | ICD-10-CM

## 2024-08-29 DIAGNOSIS — R531 Weakness: Secondary | ICD-10-CM | POA: Diagnosis not present

## 2024-08-29 DIAGNOSIS — K219 Gastro-esophageal reflux disease without esophagitis: Secondary | ICD-10-CM | POA: Diagnosis present

## 2024-08-29 DIAGNOSIS — J310 Chronic rhinitis: Secondary | ICD-10-CM | POA: Diagnosis present

## 2024-08-29 DIAGNOSIS — Z833 Family history of diabetes mellitus: Secondary | ICD-10-CM

## 2024-08-29 LAB — URINALYSIS, ROUTINE W REFLEX MICROSCOPIC
Bacteria, UA: NONE SEEN
Bilirubin Urine: NEGATIVE
Glucose, UA: NEGATIVE mg/dL
Ketones, ur: NEGATIVE mg/dL
Leukocytes,Ua: NEGATIVE
Nitrite: NEGATIVE
Protein, ur: NEGATIVE mg/dL
Specific Gravity, Urine: 1.01 (ref 1.005–1.030)
pH: 8 (ref 5.0–8.0)

## 2024-08-29 LAB — COMPREHENSIVE METABOLIC PANEL WITH GFR
ALT: 29 U/L (ref 0–44)
AST: 28 U/L (ref 15–41)
Albumin: 2.9 g/dL — ABNORMAL LOW (ref 3.5–5.0)
Alkaline Phosphatase: 89 U/L (ref 38–126)
Anion gap: 10 (ref 5–15)
BUN: 9 mg/dL (ref 8–23)
CO2: 28 mmol/L (ref 22–32)
Calcium: 9.3 mg/dL (ref 8.9–10.3)
Chloride: 103 mmol/L (ref 98–111)
Creatinine, Ser: 0.46 mg/dL — ABNORMAL LOW (ref 0.61–1.24)
GFR, Estimated: >60 mL/min (ref >60)
Glucose, Bld: 109 mg/dL — ABNORMAL HIGH (ref 70–99)
Potassium: 3.7 mmol/L (ref 3.5–5.1)
Sodium: 141 mmol/L (ref 135–145)
Total Bilirubin: 0.9 mg/dL (ref 0.0–1.2)
Total Protein: 6.6 g/dL (ref 6.5–8.1)

## 2024-08-29 LAB — CBC
HCT: 42.6 % (ref 39.0–52.0)
Hemoglobin: 14.6 g/dL (ref 13.0–17.0)
MCH: 33 pg (ref 26.0–34.0)
MCHC: 34.3 g/dL (ref 30.0–36.0)
MCV: 96.2 fL (ref 80.0–100.0)
Platelets: 242 K/uL (ref 150–400)
RBC: 4.43 MIL/uL (ref 4.22–5.81)
RDW: 14 % (ref 11.5–15.5)
WBC: 7.9 K/uL (ref 4.0–10.5)
nRBC: 0 % (ref 0.0–0.2)

## 2024-08-29 LAB — CBG MONITORING, ED: Glucose-Capillary: 109 mg/dL — ABNORMAL HIGH (ref 70–99)

## 2024-08-29 LAB — RESP PANEL BY RT-PCR (RSV, FLU A&B, COVID)  RVPGX2
Influenza A by PCR: NEGATIVE
Influenza B by PCR: NEGATIVE
Resp Syncytial Virus by PCR: NEGATIVE
SARS Coronavirus 2 by RT PCR: NEGATIVE

## 2024-08-29 MED ORDER — ATORVASTATIN CALCIUM 80 MG PO TABS
80.0000 mg | ORAL_TABLET | Freq: Every day | ORAL | Status: DC
  Administered 2024-08-29 – 2024-08-31 (×3): 80 mg via ORAL
  Filled 2024-08-29 (×3): qty 1

## 2024-08-29 MED ORDER — SODIUM CHLORIDE 0.9 % IV SOLN
1.0000 g | INTRAVENOUS | Status: DC
  Administered 2024-08-30 – 2024-08-31 (×2): 1 g via INTRAVENOUS
  Filled 2024-08-29 (×2): qty 10

## 2024-08-29 MED ORDER — FLUTICASONE FUROATE-VILANTEROL 100-25 MCG/ACT IN AEPB
1.0000 | INHALATION_SPRAY | Freq: Every day | RESPIRATORY_TRACT | Status: DC
  Administered 2024-08-29 – 2024-09-06 (×9): 1 via RESPIRATORY_TRACT
  Filled 2024-08-29: qty 28

## 2024-08-29 MED ORDER — ENOXAPARIN SODIUM 40 MG/0.4ML IJ SOSY
40.0000 mg | PREFILLED_SYRINGE | INTRAMUSCULAR | Status: DC
  Administered 2024-08-29 – 2024-09-05 (×8): 40 mg via SUBCUTANEOUS
  Filled 2024-08-29 (×8): qty 0.4

## 2024-08-29 MED ORDER — ACETAMINOPHEN 500 MG PO TABS
1000.0000 mg | ORAL_TABLET | Freq: Once | ORAL | Status: AC
  Administered 2024-08-29: 1000 mg via ORAL
  Filled 2024-08-29: qty 2

## 2024-08-29 MED ORDER — IPRATROPIUM-ALBUTEROL 0.5-2.5 (3) MG/3ML IN SOLN
3.0000 mL | Freq: Four times a day (QID) | RESPIRATORY_TRACT | Status: DC | PRN
Start: 2024-08-29 — End: 2024-09-06

## 2024-08-29 MED ORDER — LOSARTAN POTASSIUM 50 MG PO TABS
100.0000 mg | ORAL_TABLET | Freq: Every day | ORAL | Status: DC
  Administered 2024-08-29 – 2024-08-31 (×3): 100 mg via ORAL
  Filled 2024-08-29 (×3): qty 2

## 2024-08-29 MED ORDER — TETANUS-DIPHTH-ACELL PERTUSSIS 5-2-15.5 LF-MCG/0.5 IM SUSP
0.5000 mL | Freq: Once | INTRAMUSCULAR | Status: AC
  Administered 2024-08-29: 0.5 mL via INTRAMUSCULAR
  Filled 2024-08-29: qty 0.5

## 2024-08-29 MED ORDER — AZITHROMYCIN 250 MG PO TABS
500.0000 mg | ORAL_TABLET | Freq: Every day | ORAL | Status: AC
  Administered 2024-08-30 – 2024-08-31 (×2): 500 mg via ORAL
  Filled 2024-08-29 (×2): qty 2

## 2024-08-29 MED ORDER — AZITHROMYCIN 250 MG PO TABS
500.0000 mg | ORAL_TABLET | Freq: Once | ORAL | Status: AC
  Administered 2024-08-29: 500 mg via ORAL
  Filled 2024-08-29: qty 2

## 2024-08-29 MED ORDER — BUPROPION HCL ER (XL) 150 MG PO TB24
300.0000 mg | ORAL_TABLET | Freq: Every day | ORAL | Status: DC
  Administered 2024-08-29 – 2024-09-06 (×9): 300 mg via ORAL
  Filled 2024-08-29 (×9): qty 2

## 2024-08-29 MED ORDER — AMLODIPINE BESYLATE 5 MG PO TABS
5.0000 mg | ORAL_TABLET | Freq: Every day | ORAL | Status: DC
  Administered 2024-08-29 – 2024-08-31 (×3): 5 mg via ORAL
  Filled 2024-08-29 (×3): qty 1

## 2024-08-29 MED ORDER — SODIUM CHLORIDE 0.9 % IV SOLN
1.0000 g | Freq: Once | INTRAVENOUS | Status: AC
  Administered 2024-08-29: 1 g via INTRAVENOUS
  Filled 2024-08-29: qty 10

## 2024-08-29 MED ORDER — LIDOCAINE-EPINEPHRINE (PF) 2 %-1:200000 IJ SOLN
10.0000 mL | Freq: Once | INTRAMUSCULAR | Status: AC
  Administered 2024-08-29: 10 mL via INTRADERMAL
  Filled 2024-08-29: qty 20

## 2024-08-29 MED ORDER — LORAZEPAM 2 MG/ML IJ SOLN
1.0000 mg | Freq: Once | INTRAMUSCULAR | Status: AC
  Administered 2024-08-30: 1 mg via INTRAVENOUS
  Filled 2024-08-29: qty 1

## 2024-08-29 MED ORDER — SODIUM CHLORIDE 0.9 % IV SOLN: 1.0000 g | INTRAVENOUS | Status: DC

## 2024-08-29 NOTE — H&P (Signed)
 History and Physical    Patient: Austin [name] FMW:[id number] DOB: [birth year] DOA: 08/29/2024 DOS: the patient was seen and examined on 08/29/2024 PCP: [name], MD  Patient coming from: Home  Chief Complaint:  Chief Complaint  Patient presents with   Fall   HPI: Austin [name] is a 83 y.o. male with medical history significant of prostate cancer, asthma, HTN, and asthma p/w GLF iso CAP.  The patient reported falling in the bathroom at approximately 5:15 AM this morning while attempting to sit down after using the restroom. The patient lost balance and hit their head but did not experience a blackout or loss of consciousness. The patient mentioned a previous fall occurring approximately two to three months ago but did not experience dizziness at that time. The patient reported feeling low and having no appetite, which they attributed to pneumonia. The patient did not report any fevers or feeling ill otherwise. The patient stated they typically use Advil  and Tylenol as sleep aids and sometimes wake at night to urinate.  In the ED, pt [name]. Labs unremarkable. CTH wnl. CT cervical w/o acute fractures. w/ NAICA. CXR showed peripheral left lung nodular opacities suspicious for multifocal pneumonia. EDP requested admission for CAP and PT/OT eval.   Review of Systems: As mentioned in the history of present illness. All other systems reviewed and are negative. Past Medical History:  Diagnosis Date   Asthma    Depression    Hypertension    Prostate cancer [facility])    Urinary incontinence    Past Surgical History:  Procedure Laterality Date   GALLBLADDER SURGERY  1997   REPLACEMENT TOTAL KNEE BILATERAL  2001   2007   surgery replacement Bilateral 2005   TOTAL SHOULDER REPLACEMENT Bilateral 2000   Social History:  reports that he quit smoking about 4 years ago. His smoking use included cigars. He has never used smokeless tobacco. He reports current alcohol use. He reports that he  does not use drugs.  No Known Allergies  Family History  Problem Relation Age of Onset   Diabetes Brother    Heart disease Paternal Grandfather    Asthma Neg Hx     Prior to Admission medications   Medication Sig Start Date End Date Taking? Authorizing Provider  albuterol  (PROVENTIL ) (2.5 MG/3ML) 0.083% nebulizer solution USE 1 VIAL BY NEBULIZATION EVERY 4 HOURS AS NEEDED FOR WHEEZING OR SHORTNESS OF BREATH/DYSPNEA. G54.001 07/01/23   [name], MD  albuterol  (VENTOLIN  HFA) 108 (90 Base) MCG/ACT inhaler INHALE 1-2 PUFFS BY MOUTH EVERY 6 HOURS AS NEEDED FOR WHEEZE OR SHORTNESS OF BREATH 03/26/24   Meade Verdon RAMAN, MD  AMBULATORY NON FORMULARY MEDICATION walker Dispense 1 Dx code: F74.447 Use as needed 07/17/24   Joane Artist RAMAN, MD  amLODipine  (NORVASC ) 5 MG tablet TAKE 1 TABLET (5 MG TOTAL) BY MOUTH DAILY. 10/19/23   [name], MD  atorvastatin  (LIPITOR) 80 MG tablet TAKE 1 TABLET BY MOUTH EVERY DAY 02/02/24   [name], MD  baclofen  (LIORESAL ) 10 MG tablet Take 0.5-1 tablets (5-10 mg total) by mouth 4 (four) times daily as needed for muscle spasms. 12/06/23   [name], MD  buPROPion  (WELLBUTRIN  XL) 300 MG 24 hr tablet Take 1 tablet (300 mg total) by mouth daily. 07/09/24   [name], MD  Cyanocobalamin  (VITAMIN B 12 PO) Take 1,000 Units by mouth.    [provider]  fenofibrate  micronized (LOFIBRA) 200 MG capsule TAKE 1 CAPSULE BY MOUTH  EVERY DAY 04/20/24   [name], MD  fluconazole (DIFLUCAN) 200 MG tablet Take 1 tablet (200 mg total) by mouth daily. 08/22/24   Desai, Nikita S, MD  fluticasone-salmeterol (ADVAIR  HFA) 45-21 MCG/ACT inhaler Inhale 2 puffs into the lungs 2 (two) times daily. 08/15/24   Meade Verdon RAMAN, MD  gabapentin  (NEURONTIN ) 100 MG capsule TAKE 1 CAPSULE BY MOUTH EVERYDAY AT BEDTIME 04/23/24   [name], MD  glucose blood (FREESTYLE LITE) test strip CHECK BLOOD SUGAR THREE TIMES A DAY E11.9 09/09/21   [name], MD   ibuprofen  (ADVIL ) 800 MG tablet TAKE 1 TABLET BY MOUTH EVERY 8 HOURS AS NEEDED 09/09/20   [name], MD  Lancets Misc. GEORGANA 2 EXTRA) MISC Use to check blood sugar 3 times daily/ Dx E11.9 09/15/20   [name], MD  loratadine  (CLARITIN ) 10 MG tablet TAKE 1 TABLET BY MOUTH EVERY DAY 02/02/24   Desai, Nikita S, MD  LORazepam (ATIVAN) 0.5 MG tablet 1-2 tabs 30 - 60 min prior to CT. Do not drive with this medicine. 08/22/24   Meade Verdon RAMAN, MD  losartan  (COZAAR ) 100 MG tablet TAKE 1 TABLET BY MOUTH EVERY DAY 01/30/24   [name], MD  magnesium oxide (MAG-OX) 400 (240 Mg) MG tablet TAKE 1 TABLET BY MOUTH EVERY DAY 06/01/24   [name], MD  montelukast  (SINGULAIR ) 10 MG tablet TAKE 1 TABLET BY MOUTH EVERYDAY AT BEDTIME 06/01/24   Desai, Nikita S, MD  nitrofurantoin, macrocrystal-monohydrate, (MACROBID) 100 MG capsule Take 100 mg by mouth daily. 09/24/21   [provider]  omeprazole  (PRILOSEC) 20 MG capsule TAKE 2 CAPSULES BY MOUTH EVERY DAY 03/26/24   Desai, Nikita S, MD  solifenacin (VESICARE) 5 MG tablet Take 5 mg by mouth daily.    [provider]  Spacer/Aero-Holding Chambers DEVI 1 each by Does not apply route as directed. 06/29/24   Meade Verdon RAMAN, MD  Vitamin D , Cholecalciferol, 50 MCG (2000 UT) CAPS Take by mouth.    [provider]    Physical Exam: Vitals:   08/29/24 0634 08/29/24 1258  BP: 135/85 127/81  Pulse: 97 96  Resp: 19 18  Temp: 98.6 F (37 C) 97.8 F (36.6 C)  TempSrc:  Oral  SpO2: 99% 99%   General: Alert, oriented x3, resting comfortably in no acute distress HEENT: EOMI, oropharynx clear, moist mucous membranes, hearing intact Neck: Trachea midline and no gross thyromegaly Respiratory: Lungs clear to auscultation bilaterally with normal respiratory effort; no w/r/r Cardiovascular: Regular rate and rhythm w/o m/r/g Abdomen: Soft, nontender, nondistended. Positive bowel sounds MSK: No obvious joint deformities or  swelling Skin: No obvious rashes or lesions Neurologic: Awake, alert, spontaneously moves all extremities, strength intact Psychiatric: Appropriate mood and affect, conversational and cooperative   Data Reviewed:  Lab Results  Component Value Date   WBC 7.9 08/29/2024   HGB 14.6 08/29/2024   HCT 42.6 08/29/2024   MCV 96.2 08/29/2024   PLT 242 08/29/2024   Lab Results  Component Value Date   GLUCOSE 109 (H) 08/29/2024   CALCIUM  9.3 08/29/2024   NA 141 08/29/2024   K 3.7 08/29/2024   CO2 28 08/29/2024   CL 103 08/29/2024   BUN 9 08/29/2024   CREATININE 0.46 (L) 08/29/2024   Lab Results  Component Value Date   ALT 29 08/29/2024   AST 28 08/29/2024   ALKPHOS 89 08/29/2024   BILITOT 0.9 08/29/2024   No results found for:  INR, PROTIME Radiology: DG Chest 2 View Result Date: 08/29/2024 CLINICAL DATA:  Shortness of breath, dizziness, recent fall EXAM: CHEST - 2 VIEW COMPARISON:  08/31/2021 FINDINGS: Chronic elevation of the right hemidiaphragm. Mild peripheral left lung scattered nodular opacities diffusely worse in the lower lobe, suspicious for multifocal left lung pneumonia. Suspect small pleural effusions bilaterally. Stable chronic parenchymal scarring in the right lung. Overall stable heart size and vascularity. No acute edema pattern CHF. Aorta atherosclerotic. Degenerative changes of the spine. IMPRESSION: Peripheral left lung nodular opacities suspicious for multifocal pneumonia. Stable chronic changes. Electronically Signed   By: CHRISTELLA.  Shick M.D.   On: 08/29/2024 12:59   CT Cervical Spine Wo Contrast Result Date: 08/29/2024 EXAM: CT CERVICAL SPINE WITHOUT CONTRAST 08/29/2024 07:05:00 AM TECHNIQUE: CT of the cervical spine was performed without the administration of intravenous contrast. Multiplanar reformatted images are provided for review. Automated exposure control, iterative reconstruction, and/or weight based adjustment of the mA/kV was utilized to reduce the  radiation dose to as low as reasonably achievable. COMPARISON: Head CT 08/29/2024, reported separately. PET CT 08/16/2024. CLINICAL HISTORY: 83 year old male status post fall striking head on bathtub. FINDINGS: CERVICAL SPINE: BONES AND ALIGNMENT: No acute fracture or traumatic malalignment. 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 Superimposed advanced upper cervical facet arthropathy on the right, including vacuum facet arthropathy at multiple levels. DEGENERATIVE CHANGES: No significant cervical spinal stenosis by CT SOFT TISSUES: No prevertebral soft tissue swelling. Bulky calcified carotid bifurcation atherosclerosis in the bilateral neck. TMJ degeneration. LUNGS: Left greater than right abnormal lung apices appear stable from the recent PET CT, more resemble scarring with architectural distortion than acute lung inflammation. LIMITATIONS: Bilateral shoulder arthroplasty mild streak artifact at the thoracic inlet. IMPRESSION: 1. No acute traumatic injury identified in the cervical spine. 2. 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 3. Stable left greater than right apical lung architectural distortion. Electronically signed by: Helayne Hurst MD 08/29/2024 07:14 AM EST RP Workstation: [id number]   CT Head Wo Contrast Result Date: 08/29/2024 EXAM: CT HEAD WITHOUT CONTRAST 08/29/2024 07:05:00 AM TECHNIQUE: CT of the head was performed without the administration of intravenous contrast. Automated exposure control, iterative reconstruction, and/or weight based adjustment of the mA/kV was utilized to reduce the radiation dose to as low as reasonably achievable. COMPARISON: None available. CLINICAL HISTORY: 83 year old male status post fall striking head on bathtub. FINDINGS: BRAIN AND VENTRICLES: No acute hemorrhage. No evidence of acute infarct. No hydrocephalus. No extra-axial collection. No  mass effect or midline shift. Normal brain volume for age. No suspicious intracranial vascular hyperdensity. Calcified atherosclerosis at the skull base. ORBITS: Postoperative changes to both globes. SINUSES: Paranasal sinuses, middle ears and mastoids are well aerated. SOFT TISSUES AND SKULL: Right posterior convexity scalp hematoma and laceration with confluent soft tissue thickening up to 14 mm and mild soft tissue gas. Underlying calvarium intact. No skull fracture. IMPRESSION: 1. Scalp soft tissue injury. 2. Normal for age non contrast CT appearance of the brain. Electronically signed by: Helayne Hurst MD 08/29/2024 07:09 AM EST RP Workstation: [id number]   DG Hip Unilat W or Wo Pelvis 2-3 Views Right Result Date: 08/29/2024 EXAM: 2 or 3 VIEW(S) XRAY OF THE RIGHT HIP 08/29/2024 06:58:00 AM COMPARISON: MRI 08/07/2024. CLINICAL HISTORY: fall FINDINGS: BONES AND JOINTS: Solid fusion of the pubic symphysis. Sequelae of remote avulsion fracture of the lateral aspect of the right inferior pubic rami. No acute fracture or  focal osseous lesion. . Mild bilateral and symmetric degenerative changes involving both hips. SOFT TISSUES: Seed implants identified within the prostate gland. LUMBAR SPINE: Degenerative disc disease noted within the imaged portions of the lower lumbar spine. IMPRESSION: 1. No acute fracture or dislocation. Electronically signed by: Waddell Calk MD 08/29/2024 07:04 AM EST RP Workstation: [id number]    Assessment and Plan: 83 y.o. male with medical history significant of prostate cancer, asthma-COPD overlap syndrome, and HTN p/w GLF iso CAP.  Multifocal CAP -IV CTX 1g daily to complete 5 day CAP course -PO azithromycin  500mg  daily to complete 3 day CAP course -Duonebs prn -Wean O2 as tolerated -Ambulatory pulse ox prior to d/c  Fall -PT/OT consulted; apprec eval/recs -HOLD pta baclofen  and gabapentin  for now; resume prn  Asthma-COPD overlap syndrome -Breztri  daily and Duonebs  prn  HTN -PTA amlodipine  5mg  daily and losartan  100mg  daily  HLD -PTA atorvastatin   Mood disorder -PTA Wellbutrin    Advance Care Planning:   Code Status: Full Code   Consults: N/A  Family Communication: Wife  Severity of Illness: The appropriate patient status for this patient is INPATIENT. Inpatient status is judged to be reasonable and necessary in order to provide the required intensity of service to ensure the patient's safety. The patient's presenting symptoms, physical exam findings, and initial radiographic and laboratory data in the context of their chronic comorbidities is felt to place them at high risk for further clinical deterioration. Furthermore, it is not anticipated that the patient will be medically stable for discharge from the hospital within 2 midnights of admission.   * I certify that at the point of admission it is my clinical judgment that the patient will require inpatient hospital care spanning beyond 2 midnights from the point of admission due to high intensity of service, high risk for further deterioration and high frequency of surveillance required.*   ------- I spent 56 minutes reviewing previous notes, at the bedside counseling/discussing the treatment plan, and performing clinical documentation.  Author: Marsha Ada, MD 08/29/2024 1:45 PM  For on call review [email or web address].

## 2024-08-29 NOTE — Telephone Encounter (Signed)
 Copied from CRM [id number]. Topic: Clinical - Red Word Triage >> Aug 29, 2024 12:59 PM Suzen RAMAN wrote: Red Word that prompted transfer to Nurse Triage: fell last night; taken to [facility] . Patient wont be admitted for several more hours. Patient wife would like to know if Dr. Kennyth can follow up with the patient.    CB# [phone]   Left message to return call to our office at their convenience. Dr Kennyth will get information form Ed, schedule a visit with Dr Kennyth to F/U ED visit  [facility]

## 2024-08-29 NOTE — Telephone Encounter (Signed)
 Pharmacy Patient Advocate Encounter  Insurance verification completed.    The patient is insured through [employer]. Patient has Medicare and is not eligible for a copay card, but may be able to apply for patient assistance or Medicare RX Payment Plan (Patient Must reach out to their plan, if eligible for payment plan), if available.    Ran test claim for Breo Ellipta 100-25mcg and the current 30 day co-pay is $0.   This test claim was processed through [employer]- copay amounts may vary at other pharmacies due to [employer], or as the patient moves through the different stages of their insurance plan.

## 2024-08-29 NOTE — ED Notes (Signed)
 Patient transported to CT

## 2024-08-29 NOTE — ED Provider Notes (Signed)
 [place] [facility] AT [facility] Provider Note   CSN: [id number] Arrival date & time: 08/29/24  9376     Patient presents with: Austin [name] is a 83 y.o. male.   The history is provided by the patient, the EMS personnel and medical records. No language interpreter was used.  Fall     83 year old male with significant history of hypertension, depression, cervical disc disease, prostate cancer, chronic back pain, COPD, urinary incontinence brought here via EMS from home for evaluation of fall.  Patient states that this morning as he was getting up to use the bathroom to urinate when he turned around he may have lost balance and fell to the ground striking the right side of his head against the bathtub in the bathroom.  He did strike his right hip as well but denies any loss of consciousness.  His wife did call EMS and patient was brought here.  Currently patient denies any significant headache, neck pain, chest pain, trouble breathing, abdominal pain, back pain or pain to his extremities.  He denies any lightheadedness or dizziness.  He is not on blood thinner medication.  He is unsure his last tetanus status.  He denies any precipitating symptoms prior to the fall.  And he was doing fine last night.  Prior to Admission medications   Medication Sig Start Date End Date Taking? Authorizing Provider  albuterol  (PROVENTIL ) (2.5 MG/3ML) 0.083% nebulizer solution USE 1 VIAL BY NEBULIZATION EVERY 4 HOURS AS NEEDED FOR WHEEZING OR SHORTNESS OF BREATH/DYSPNEA. G54.001 07/01/23   [name], MD  albuterol  (VENTOLIN  HFA) 108 [phone] Base) MCG/ACT inhaler INHALE 1-2 PUFFS BY MOUTH EVERY 6 HOURS AS NEEDED FOR WHEEZE OR SHORTNESS OF BREATH 03/26/24   Meade Verdon RAMAN, MD  AMBULATORY NON FORMULARY MEDICATION walker Dispense 1 Dx code: F74.447 Use as needed 07/17/24   Joane Artist RAMAN, MD  amLODipine  (NORVASC ) 5 MG tablet TAKE 1 TABLET (5 MG TOTAL) BY MOUTH DAILY. 10/19/23    [name], MD  atorvastatin  (LIPITOR) 80 MG tablet TAKE 1 TABLET BY MOUTH EVERY DAY 02/02/24   [name], MD  baclofen  (LIORESAL ) 10 MG tablet Take 0.5-1 tablets (5-10 mg total) by mouth 4 (four) times daily as needed for muscle spasms. 12/06/23   [name], MD  buPROPion  (WELLBUTRIN  XL) 300 MG 24 hr tablet Take 1 tablet (300 mg total) by mouth daily. 07/09/24   [name], MD  Cyanocobalamin  (VITAMIN B 12 PO) Take 1,000 Units by mouth.    [provider]  fenofibrate  micronized (LOFIBRA) 200 MG capsule TAKE 1 CAPSULE BY MOUTH EVERY DAY 04/20/24   [name], MD  fluconazole  (DIFLUCAN ) 200 MG tablet Take 1 tablet (200 mg total) by mouth daily. 08/22/24   Desai, Nikita S, MD  fluticasone -salmeterol (ADVAIR  HFA) 45-21 MCG/ACT inhaler Inhale 2 puffs into the lungs 2 (two) times daily. 08/15/24   Meade Verdon RAMAN, MD  gabapentin  (NEURONTIN ) 100 MG capsule TAKE 1 CAPSULE BY MOUTH EVERYDAY AT BEDTIME 04/23/24   [name], MD  glucose blood (FREESTYLE LITE) test strip CHECK BLOOD SUGAR THREE TIMES A DAY E11.9 09/09/21   [name], MD  ibuprofen  (ADVIL ) 800 MG tablet TAKE 1 TABLET BY MOUTH EVERY 8 HOURS AS NEEDED 09/09/20   [name], MD  Lancets Misc. GEORGANA 2 EXTRA) MISC Use to check blood sugar 3 times daily/ Dx E11.9 09/15/20   [name], MD  loratadine  (CLARITIN ) 10 MG tablet TAKE 1 TABLET BY MOUTH EVERY DAY 02/02/24   Desai, Nikita S, MD  LORazepam (ATIVAN) 0.5 MG tablet 1-2 tabs 30 - 60 min prior to CT. Do not drive with this medicine. 08/22/24   Meade Verdon RAMAN, MD  losartan  (COZAAR ) 100 MG tablet TAKE 1 TABLET BY MOUTH EVERY DAY 01/30/24   [name], MD  magnesium oxide (MAG-OX) 400 (240 Mg) MG tablet TAKE 1 TABLET BY MOUTH EVERY DAY 06/01/24   [name], MD  montelukast  (SINGULAIR ) 10 MG tablet TAKE 1 TABLET BY MOUTH EVERYDAY AT BEDTIME 06/01/24   Desai, Nikita S, MD  nitrofurantoin, macrocrystal-monohydrate, (MACROBID) 100 MG capsule  Take 100 mg by mouth daily. 09/24/21   [provider]  omeprazole  (PRILOSEC) 20 MG capsule TAKE 2 CAPSULES BY MOUTH EVERY DAY 03/26/24   Desai, Nikita S, MD  solifenacin (VESICARE) 5 MG tablet Take 5 mg by mouth daily.    [provider]  Spacer/Aero-Holding Chambers DEVI 1 each by Does not apply route as directed. 06/29/24   Meade Verdon RAMAN, MD  Vitamin D , Cholecalciferol, 50 MCG (2000 UT) CAPS Take by mouth.    [provider]    Allergies: Patient has no known allergies.    Review of Systems  All other systems reviewed and are negative.   Updated Vital Signs BP 135/85 (BP Location: Right Arm)   Pulse 97   Temp 98.6 F (37 C)   Resp 19   SpO2 99%   Physical Exam Vitals and nursing note reviewed.  Constitutional:      General: He is not in acute distress.    Appearance: He is well-developed.  HENT:     Head: Normocephalic.     Comments: Scalp: 7 cm laceration noted to right parietal scalp not actively bleeding.  No crepitus noted.  No raccoons eyes no Battle sign no hemotympanum or septal hematoma or malocclusion or midface tenderness    Mouth/Throat:     Mouth: Mucous membranes are dry.  Eyes:     Conjunctiva/sclera: Conjunctivae normal.  Cardiovascular:     Rate and Rhythm: Normal rate and regular rhythm.     Pulses: Normal pulses.     Heart sounds: Normal heart sounds.  Pulmonary:     Effort: Pulmonary effort is normal.     Breath sounds: Rales (crackles heard at LLL) present.  Abdominal:     Palpations: Abdomen is soft.     Tenderness: There is no abdominal tenderness.  Musculoskeletal:        General: No tenderness (Right hip nontender to palpation with normal range of motion).     Cervical back: Normal range of motion and neck supple.     Comments: 5 out of 5 strength in all 4 extremities  Skin:    Findings: No rash.     Comments: Small skin tear noted to left mid forearm  Neurological:     Mental Status: He is alert. Mental status  is at baseline.  Psychiatric:        Mood and Affect: Mood normal.     (all labs ordered are listed, but only abnormal results are displayed) Labs Reviewed  COMPREHENSIVE METABOLIC PANEL WITH GFR - Abnormal; Notable for the following components:      Result Value   Glucose, Bld 109 (*)    Creatinine, Ser 0.46 (*)    Albumin 2.9 (*)    All other components within normal limits  URINALYSIS, ROUTINE  W REFLEX MICROSCOPIC - Abnormal; Notable for the following components:   APPearance CLOUDY (*)    Hgb urine dipstick MODERATE (*)    All other components within normal limits  CBG MONITORING, ED - Abnormal; Notable for the following components:   Glucose-Capillary 109 (*)    All other components within normal limits  RESP PANEL BY RT-PCR (RSV, FLU A&B, COVID)  [id number]  CBC    EKG: EKG Interpretation Date/Time:  Wednesday August 29 2024 07:12:42 EST Ventricular Rate:  92 PR Interval:  200 QRS Duration:  126 QT Interval:  416 QTC Calculation: 514 R Axis:   -56  Text Interpretation: Normal sinus rhythm Right bundle branch block Left anterior fascicular block Bifascicular block Minimal voltage criteria for LVH, may be normal variant ( R in aVL ) Septal infarct , age undetermined Abnormal ECG Baseline wander TECHNICALLY DIFFICULT No old tracing to compare Confirmed by Emil Share [phone]) on 08/29/2024 8:14:27 AM  Radiology: CT Cervical Spine Wo Contrast Result Date: 08/29/2024 EXAM: CT CERVICAL SPINE WITHOUT CONTRAST 08/29/2024 07:05:00 AM TECHNIQUE: CT of the cervical spine was performed without the administration of intravenous contrast. Multiplanar reformatted images are provided for review. Automated exposure control, iterative reconstruction, and/or weight based adjustment of the mA/kV was utilized to reduce the radiation dose to as low as reasonably achievable. COMPARISON: Head CT 08/29/2024, reported separately. PET CT 08/16/2024. CLINICAL HISTORY: 83 year old male status post fall  striking head on bathtub. FINDINGS: CERVICAL SPINE: BONES AND ALIGNMENT: No acute fracture or traumatic malalignment. 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 Superimposed advanced upper cervical facet arthropathy on the right, including vacuum facet arthropathy at multiple levels. DEGENERATIVE CHANGES: No significant cervical spinal stenosis by CT SOFT TISSUES: No prevertebral soft tissue swelling. Bulky calcified carotid bifurcation atherosclerosis in the bilateral neck. TMJ degeneration. LUNGS: Left greater than right abnormal lung apices appear stable from the recent PET CT, more resemble scarring with architectural distortion than acute lung inflammation. LIMITATIONS: Bilateral shoulder arthroplasty mild streak artifact at the thoracic inlet. IMPRESSION: 1. No acute traumatic injury identified in the cervical spine. 2. 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 3. Stable left greater than right apical lung architectural distortion. Electronically signed by: Helayne Hurst MD 08/29/2024 07:14 AM EST RP Workstation: [id number]   CT Head Wo Contrast Result Date: 08/29/2024 EXAM: CT HEAD WITHOUT CONTRAST 08/29/2024 07:05:00 AM TECHNIQUE: CT of the head was performed without the administration of intravenous contrast. Automated exposure control, iterative reconstruction, and/or weight based adjustment of the mA/kV was utilized to reduce the radiation dose to as low as reasonably achievable. COMPARISON: None available. CLINICAL HISTORY: 83 year old male status post fall striking head on bathtub. FINDINGS: BRAIN AND VENTRICLES: No acute hemorrhage. No evidence of acute infarct. No hydrocephalus. No extra-axial collection. No mass effect or midline shift. Normal brain volume for age. No suspicious intracranial vascular hyperdensity. Calcified atherosclerosis at the skull base. ORBITS: Postoperative  changes to both globes. SINUSES: Paranasal sinuses, middle ears and mastoids are well aerated. SOFT TISSUES AND SKULL: Right posterior convexity scalp hematoma and laceration with confluent soft tissue thickening up to 14 mm and mild soft tissue gas. Underlying calvarium intact. No skull fracture. IMPRESSION: 1. Scalp soft tissue injury. 2. Normal for age non contrast CT appearance of the brain. Electronically signed by: Helayne Hurst MD 08/29/2024 07:09 AM EST RP Workstation: [id number]   DG Hip Unilat W or Wo Pelvis 2-3 Views  Right Result Date: 08/29/2024 EXAM: 2 or 3 VIEW(S) XRAY OF THE RIGHT HIP 08/29/2024 06:58:00 AM COMPARISON: MRI 08/07/2024. CLINICAL HISTORY: fall FINDINGS: BONES AND JOINTS: Solid fusion of the pubic symphysis. Sequelae of remote avulsion fracture of the lateral aspect of the right inferior pubic rami. No acute fracture or focal osseous lesion. . Mild bilateral and symmetric degenerative changes involving both hips. SOFT TISSUES: Seed implants identified within the prostate gland. LUMBAR SPINE: Degenerative disc disease noted within the imaged portions of the lower lumbar spine. IMPRESSION: 1. No acute fracture or dislocation. Electronically signed by: Waddell Calk MD 08/29/2024 07:04 AM EST RP Workstation: [id number]     .Laceration Repair  Date/Time: 08/29/2024 11:34 AM  Performed by: Nivia Colon, PA-C Authorized by: Nivia Colon, PA-C   Consent:    Consent obtained:  Verbal   Consent given by:  Patient   Risks, benefits, and alternatives were discussed: yes     Risks discussed:  Infection and poor wound healing   Alternatives discussed:  No treatment Universal protocol:    Procedure explained and questions answered to patient or proxy's satisfaction: yes     Relevant documents present and verified: yes     Imaging studies available: yes     Patient identity confirmed:  Verbally with patient and arm band Anesthesia:    Anesthesia method:  Local infiltration   Local  anesthetic:  Lidocaine  2% WITH epi Laceration details:    Location:  Scalp   Length (cm):  7   Depth (mm):  4 Pre-procedure details:    Preparation:  Patient was prepped and draped in usual sterile fashion Exploration:    Hemostasis achieved with:  Epinephrine    Wound exploration: wound explored through full range of motion     Wound extent: no underlying fracture     Contaminated: no   Treatment:    Area cleansed with:  Saline   Amount of cleaning:  Standard   Irrigation solution:  Sterile saline   Irrigation method:  Pressure wash   Debridement:  None   Undermining:  None   Scar revision: no   Skin repair:    Repair method:  Staples   Number of staples:  5 Approximation:    Approximation:  Close Repair type:    Repair type:  Intermediate Post-procedure details:    Procedure completion:  Tolerated well, no immediate complications    Medications Ordered in the ED  LORazepam  (ATIVAN ) injection 1 mg (has no administration in time range)  cefTRIAXone  (ROCEPHIN ) 1 g in sodium chloride  0.9 % 100 mL IVPB (has no administration in time range)  azithromycin  (ZITHROMAX ) tablet 500 mg (has no administration in time range)  lidocaine -EPINEPHrine  (XYLOCAINE  W/EPI) 2 %-1:200000 (PF) injection 10 mL (10 mLs Intradermal Given by Other 08/29/24 0908)  Tdap (ADACEL ) injection 0.5 mL (0.5 mLs Intramuscular Given 08/29/24 0917)  acetaminophen  (TYLENOL ) tablet 1,000 mg (1,000 mg Oral Given 08/29/24 9082)                                    Medical Decision Making Amount and/or Complexity of Data Reviewed Labs: ordered. Radiology: ordered.  Risk OTC drugs. Prescription drug management.   BP 135/85 (BP Location: Right Arm)   Pulse 97   Temp 98.6 F (37 C)   Resp 19   SpO2 99%   22:61 AM 83 year old male with significant history of hypertension, depression, cervical disc disease, prostate cancer,  chronic back pain, COPD, urinary incontinence brought here via EMS from home for  evaluation of fall.  Patient states that this morning as he was getting up to use the bathroom to urinate when he turned around he may have lost balance and fell to the ground striking the right side of his head against the bathtub in the bathroom.  He did strike his right hip as well but denies any loss of consciousness.  His wife did call EMS and patient was brought here.  Currently patient denies any significant headache, neck pain, chest pain, trouble breathing, abdominal pain, back pain or pain to his extremities.  He denies any lightheadedness or dizziness.  He is not on blood thinner medication.  He is unsure his last tetanus status.  He denies any precipitating symptoms prior to the fall.  And he was doing fine last night.  Exam notable for a 7 cm laceration noted to her right parietal scalp.  Small skin tear noted to left forearm.  No other significant signs of injury.  Right hip exam unremarkable.  No significant midline spine tenderness.  Patient is mentating at baseline.  -Labs ordered, independently viewed and interpreted by me.  Labs remarkable for UA is cloudy with Hgb likely 2/2 hx of prostate CA.  Remainder of the labs are reassuring.   -The patient was maintained on a cardiac monitor.  I personally viewed and interpreted the cardiac monitored which showed an underlying rhythm of: NSR -Imaging independently viewed and interpreted by me and I agree with radiologist's interpretation.  Result remarkable for CT of head/cspine without acute changes except scalp soft tissue injury.  R hip Xray unremarkable -This patient presents to the ED for concern of fall, this involves an extensive number of treatment options, and is a complaint that carries with it a high risk of complications and morbidity.  The differential diagnosis includes mechanical fall, stroke, metabolic derangement, cardiac event, uti -Co morbidities that complicate the patient evaluation includes HTN, prostate CA, COPD -Treatment  includes scalp repair -Reevaluation of the patient after these medicines showed that the patient improved -PCP office notes or outside notes reviewed -Discussion with attending Dr. Emil.  I appreciate consultation from [facility] Dr. Georgina who agrees to admit pt.  -Escalation to admission/observation considered: dispo pending.   11:35 AM Pt normally walks with a walker.  Unfortunately currently he endorse dizziness upon standing and unable to ambulate.  He report falling several times with in the past 1-2 weeks. considered obtain brain MRI to r/o posterior circulation stroke. Pt also report having SOB with exertion.  Lungs exam with crackles heard to LLL, CXR ordered.   1:14 PM Chest x-ray shows left lung nodule opacity suspicious for multifocal pneumonia.  Given his weakness, dizziness, recurrent falls along with cough and shortness of breath, I felt his pneumonia is likely the primary drive that contributed to his symptoms.  Will treat patient with Rocephin  and azithromycin  antibiotic.  I am less concerned for posterior circulation stroke therefore brain MRI will not be obtained.     Final diagnoses:  Community acquired pneumonia of left lower lobe of lung  Scalp laceration, initial encounter    ED Discharge Orders     None          Nivia Colon, PA-C 08/29/24 1336    Emil Share, DO 08/29/24 1404

## 2024-08-29 NOTE — ED Triage Notes (Signed)
 Pt BIB [facility] from home. Pt went to go to the bathroom became dizzy having a subsequent fall. Hit his head on the bath tub. C/o right hip pain. No LOC. No blood thinner. Deneis dizziness at this time.   EMS VS 98 Ra, HR 99, BP 148/98

## 2024-08-29 NOTE — ED Provider Triage Note (Signed)
 Emergency Medicine Provider Triage Evaluation Note  Austin [name] , a 83 y.o. male  was evaluated in triage.  Pt complains of fall. Pt got up to use the bathroom this AM, when he turn around he loss balance and fell, striking R side of head against ground.  No LOC, not on blood thinner, no prodromal.  Minimal pain currently.  Unsure last Tdap  Review of Systems  Positive: As above Negative: As above  Physical Exam  BP 135/85 (BP Location: Right Arm)   Pulse 97   Temp 98.6 F (37 C)   Resp 19   SpO2 99%  Gen:   Awake, no distress   Resp:  Normal effort  MSK:   Moves extremities without difficulty  Other:  Laceration to R parietal region  Medical Decision Making  Medically screening exam initiated at 8:06 AM.  Appropriate orders placed.  Austin [name] was informed that the remainder of the evaluation will be completed by another provider, this initial triage assessment does not replace that evaluation, and the importance of remaining in the ED until their evaluation is complete.     Nivia Colon, PA-C 08/29/24 [phone]

## 2024-08-30 ENCOUNTER — Encounter (HOSPITAL_COMMUNITY): Payer: Self-pay | Admitting: Hospitalist

## 2024-08-30 ENCOUNTER — Inpatient Hospital Stay (HOSPITAL_COMMUNITY)

## 2024-08-30 DIAGNOSIS — W1830XA Fall on same level, unspecified, initial encounter: Secondary | ICD-10-CM

## 2024-08-30 DIAGNOSIS — R471 Dysarthria and anarthria: Secondary | ICD-10-CM

## 2024-08-30 LAB — CBC
HCT: 38.2 % — ABNORMAL LOW (ref 39.0–52.0)
Hemoglobin: 12.8 g/dL — ABNORMAL LOW (ref 13.0–17.0)
MCH: 32.4 pg (ref 26.0–34.0)
MCHC: 33.5 g/dL (ref 30.0–36.0)
MCV: 96.7 fL (ref 80.0–100.0)
Platelets: 241 K/uL (ref 150–400)
RBC: 3.95 MIL/uL — ABNORMAL LOW (ref 4.22–5.81)
RDW: 14 % (ref 11.5–15.5)
WBC: 10.8 K/uL — ABNORMAL HIGH (ref 4.0–10.5)
nRBC: 0 % (ref 0.0–0.2)

## 2024-08-30 LAB — PROCALCITONIN: Procalcitonin: <0.1 ng/mL

## 2024-08-30 LAB — BASIC METABOLIC PANEL WITH GFR
Anion gap: 7 (ref 5–15)
BUN: 12 mg/dL (ref 8–23)
CO2: 28 mmol/L (ref 22–32)
Calcium: 8.7 mg/dL — ABNORMAL LOW (ref 8.9–10.3)
Chloride: 102 mmol/L (ref 98–111)
Creatinine, Ser: 0.59 mg/dL — ABNORMAL LOW (ref 0.61–1.24)
GFR, Estimated: >60 mL/min (ref >60)
Glucose, Bld: 117 mg/dL — ABNORMAL HIGH (ref 70–99)
Potassium: 4 mmol/L (ref 3.5–5.1)
Sodium: 137 mmol/L (ref 135–145)

## 2024-08-30 MED ORDER — PANTOPRAZOLE SODIUM 40 MG PO TBEC
80.0000 mg | DELAYED_RELEASE_TABLET | Freq: Every day | ORAL | Status: DC
  Administered 2024-08-31 – 2024-09-06 (×7): 80 mg via ORAL
  Filled 2024-08-30 (×7): qty 2

## 2024-08-30 MED ORDER — LORAZEPAM 2 MG/ML IJ SOLN
INTRAMUSCULAR | Status: AC
  Filled 2024-08-30: qty 1

## 2024-08-30 NOTE — Progress Notes (Signed)
   08/30/24 0110  Provider Notification  Provider Name/Title A Chavez,NP  Date Provider Notified 08/30/24  Time Provider Notified 0116  Method of Notification Call  Notification Reason Other (Comment) (He is having trouble swallowing his pills.    Wife  says he is losing weight due to not being able to eat and swallow   Son stated ,he is able to  tolerate liquid food,is possible to change his diet,, on call proviuder informed)  Provider response Other (Comment);See new orders  Date of Provider Response 08/30/24  Time of Provider Response [phone]

## 2024-08-30 NOTE — Evaluation (Signed)
 Clinical/Bedside Swallow Evaluation Patient Details  Name: Austin [name] MRN: [id number] Date of Birth: [birth year]  Today's Date: 08/30/2024 Time: SLP Start Time (ACUTE ONLY): 0849 SLP Stop Time (ACUTE ONLY): 0914 SLP Time Calculation (min) (ACUTE ONLY): 25 min  Past Medical History:  Past Medical History:  Diagnosis Date   Asthma    Depression    Hypertension    Prostate cancer (HCC)    Urinary incontinence    Past Surgical History:  Past Surgical History:  Procedure Laterality Date   GALLBLADDER SURGERY  1997   REPLACEMENT TOTAL KNEE BILATERAL  2001   2007   surgery replacement Bilateral 2005   TOTAL SHOULDER REPLACEMENT Bilateral 2000   HPI:  Mr. Tanimoto is an 84 yo presenting 11/19 after GLF, also found to have multifocal CAP. CT Head showed scalp soft tissue injury and otherwise normal for age appearance of the brain. 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 Swallow eval was ordered due to pt having difficulty swallowing pills, reporting dysphagia for three weeks PTA. He was being treated for thrush by his pulmonologist, and GI referral was being considered if symptoms persisted after treatment. PMH also includes: asthma, depression, HTN, prostate ca    Assessment / Plan / Recommendation  Clinical Impression  Pt presents with dysphagia to solids which could be indicative of an esophageal and/or pharyngoesophageal issue (note osseous changes on CT cervical spine). Note that he feels like he has been having changes to both his swallowing and his speech for ~3 weeks now (CT Head without obvious changes, cranial nerve exam WNL). No signs of aspiration were observed. He wants to try a Dys 2 diet (finely chopped) with thin liquids, and felt like he did well with meds whole in puree, followed by a liquid wash. Will f/u to see if MBS may be beneficial, but pt may also benefit from a more dedicated assessment of the  esophagus.   Pt's oropharyngeal function appears to be [facility] during observation across solid and liquid consistencies, although pt feels like he needs a lot of liquids/moisture to be able to swallow solids. When given the chance to self-feed and use liquid washes PRN, but he says that dry foods and pills have a tendency to feel stuck. He and his wife both say that he doesn't typically cough with eating and drinking. There are certain foods that he has been eating that would only be available on a regular diet (like certain soups, salads with a lot of dressing), but he would like to proceed with softer choices after given education about diet options.   SLP Visit Diagnosis: Dysphagia, unspecified (R13.10)    Aspiration Risk       Diet Recommendation Dysphagia 2 (Fine chop);Thin liquid    Liquid Administration via: Cup;Straw Medication Administration: Whole meds with puree Supervision: Patient able to self feed;Intermittent supervision to cue for compensatory strategies Compensations: Slow rate;Small sips/bites;Follow solids with liquid Postural Changes: Seated upright at 90 degrees;Remain upright for at least 30 minutes after po intake    Other  Recommendations Oral Care Recommendations: Oral care BID     Assistance Recommended at Discharge    Functional Status Assessment Patient has had a recent decline in their functional status and demonstrates the ability to make significant improvements in function in a reasonable and predictable amount of time.  Frequency and Duration min 2x/week  2 weeks       Prognosis Prognosis for improved oropharyngeal function: Fair  Barriers to Reach Goals: Other (Comment) (potential etiology)      Swallow Study   General HPI: Mr. Cease is an 83 yo presenting 11/19 after GLF, also found to have multifocal CAP. CT Head showed scalp soft tissue injury and otherwise normal for age appearance of the brain. CT cervical spine showed hyperostosis and ankylosis at  C6-C7 with advanced upper cervical facet arthropathy. Swallow eval was ordered due to pt having difficulty swallowing pills, reporting dysphagia for three weeks PTA. He was being treated for thrush by his pulmonologist, and GI referral was being considered if symptoms persisted after treatment. PMH also includes: asthma, depression, HTN, prostate ca Type of Study: Bedside Swallow Evaluation Previous Swallow Assessment: none in chart Diet Prior to this Study: Full liquid diet;Thin liquids (Level 0) Temperature Spikes Noted: No Respiratory Status: Room air History of Recent Intubation: No Behavior/Cognition: Alert;Pleasant mood;Cooperative Oral Cavity Assessment: Dry Oral Care Completed by SLP: Other (Comment) (recently completed by pt) Oral Cavity - Dentition: Adequate natural dentition Vision: Functional for self-feeding Self-Feeding Abilities: Able to feed self Patient Positioning: Upright in bed Baseline Vocal Quality: Normal Volitional Cough: Strong Volitional Swallow: Able to elicit    Oral/Motor/Sensory Function Overall Oral Motor/Sensory Function: Within functional limits   Ice Chips Ice chips: Not tested   Thin Liquid Thin Liquid: Within functional limits Presentation: Self Fed;Straw    Nectar Thick Nectar Thick Liquid: Not tested   Honey Thick Honey Thick Liquid: Not tested   Puree Puree: Within functional limits Presentation: Self Fed;Spoon   Solid     Solid: Within functional limits Presentation: Self Fed      Leita SAILOR., M.A. CCC-SLP Acute Rehabilitation Services Office: [phone]  Secure chat preferred  08/30/2024,9:28 AM

## 2024-08-30 NOTE — Plan of Care (Signed)
   Problem: Clinical Measurements: Goal: Will remain free from infection Outcome: Progressing   Problem: Pain Managment: Goal: General experience of comfort will improve and/or be controlled Outcome: Progressing   Problem: Safety: Goal: Ability to remain free from injury will improve Outcome: Progressing

## 2024-08-30 NOTE — Progress Notes (Incomplete)
 PROGRESS NOTE    Austin [name]  FMW:[id number] DOB: [birth year] DOA: 08/29/2024 PCP: [name], MD    Brief Narrative:  The patient is an 83 year old male with PMHx of asthma/COPD overlap syndrome, chronic rhinitis, recent oral thrush treated with fluconazole , HTN, HLD, depression, prostate cancer who presented to the ED on 08/29/2024 after an episode of dizziness and subsequent fall with trauma to the right side of head leading to a right parietal laceration, without loss of consciousness.  On initial evaluation in the ED, he reportedly denied lightheadedness or dizziness prior to falling, though he later endorsed several weeks of dizziness and unsteadiness while ambulating, requiring the use of a walker, and was later noted to be dizzy and unable to ambulate in the ED.  MRI was deferred at that time.  In the ED, he was afebrile with a temp of 98.6 F, HR 97, RR 19, BP 135/85, SpO2 99% on RA.  CBC was unremarkable with no leukocytosis.  CMP showed albumin of 2.9, lower than longstanding baseline.  UA was negative for UTI findings, showed moderate Hgb in the setting of known prostate cancer.  COVID/flu/RSV negative.  CT head showed scalp soft tissue injury.  CT cervical spine showed no acute traumatic injury.  He was noted to have crackles at the left lower lobe with CXR findings of peripheral left lung nodular opacity suspicious for multifocal pneumonia, for which he was started on Rocephin  and azithromycin  and placed up for admission. Notably, patient has been experiencing dysphagia with solids and dysarthria for approximately 3 weeks now, which were thought to be associated with oral thrust in the setting of Breztri  use, for which he was treated with two consecutive 7-day courses of fluconazole  by his outpatient pulmonologist with discontinuation of Breztri  in favor of lower dose Advair . Further, he has unintentional weight loss of approximately 26 lbs over the last 3 months.    Assessment and  Plan:  # Stroke rule out # Dysphagia # Dysarthria # Dizziness # Ataxia - Patient presents with multiple falls over last 3-4 weeks with new onset unsteadiness while ambulating necessitating the use of a walker, associated with some dizziness. Additionally, he has noted dysphagia and dysarthria around the same period of time, initially attributed to thrush but not resolving with treatment with 14 days of fluconazole  - Ordered MRI to rule out stroke - SLP following for dysphagia - recommended dysphagia-2 with thin liquids - SLP reconsulted for language evaluation given dysarthria - PT/OT following - recommending SNF for rehab  # Possible CAP - In the ED, noted to have LLL crackles on physical exam with CXR findings of peripheral left lung nodular opacities suggestive of multifocal pneumonia.  Was started on IV Rocephin  and azithromycin  and admitted for CAP - However, patient is afebrile with no leukocytosis and no cough.  Clinically, very low suspicion for pneumonia. - Procalcitonin <0.1, further supporting unlikely diagnosis of bacterial pneumonia - Of note, the patient recently underwent a PET scan on 08/20/2024 which noted patchy ground glass densities in both lungs suggestive of edema or inflammation.  Outpatient pulmonology ordered a high-resolution CT scan for further characterization of the pulmonary findings on PET scan, which was scheduled on the same day of his admission - Will continue IV Rocephin  and azithromycin  for now - Ordered CT chest for better characterization of LLL rales and CXR findings  #Ground-level falls # Right parietal scalp laceration and hematoma - Presented after GLF in the bathroom, reportedly due to loss of balance, with  trauma to head - CT head showed right posterior convexity scalp hematoma and laceration with confluent soft tissue thickening and mild soft tissue gas, underlying calvarium intact with no skull fracture. 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 with advanced upper cervical facet arthropathy - Underwent laceration repair in the ED  #Hypertension - Continue home amlodipine  5 mg daily and losartan  100 mg daily  #MDD - Continue home Wellbutrin  300 mg daily  #HLD - Continue home Lipitor 80 mg daily  # GERD - Continue PPI formulary alternative   DVT prophylaxis: enoxaparin (LOVENOX) injection 40 mg Start: 08/29/24 1400   Code Status:   Code Status: Full Code  Family Communication: ***  Disposition Plan: *** PT - Follow Up Recommendations: Skilled nursing-short term rehab (<3 hours/day) OT - Follow Up Recommendations: Skilled nursing-short term rehab (<3 hours/day)  DME Needs: PT equipment: BSC/3in1      Level of care: Med-Surg  Consultants:  ***  Procedures:  ***  Antimicrobials: ***    Subjective: ***  Objective: Vitals:   08/30/24 0623 08/30/24 1013 08/30/24 1803 08/30/24 2041  BP: 119/73 113/67 112/73 119/64  Pulse: 95 96 (!) 102 91  Resp: 16 17 18 17   Temp: 98.6 F (37 C) (!) 97.5 F (36.4 C) 97.7 F (36.5 C) 97.7 F (36.5 C)  TempSrc: Oral Oral Oral   SpO2: 96% 97% 95% 96%  Weight:      Height:        Intake/Output Summary (Last 24 hours) at 08/30/2024 2233 Last data filed at 08/30/2024 1000 Gross per 24 hour  Intake 240 ml  Output 600 ml  Net -360 ml   Filed Weights   08/29/24 1524  Weight: 70 kg    Examination:  General exam: Appears calm and comfortable  Respiratory system: No increased WOB. CTAB. Cardiovascular system: +S1/S2, RRR. No JVD or murmurs. No pedal edema. Gastrointestinal system: Soft, NTND. No masses felt. Normal bowel sounds. Central nervous system: Alert and oriented. No focal neurological deficits. Extremities: 5/5 strength symmetrically Skin: No rashes, lesions or ulcers Psychiatry: Judgement and insight appear normal. Mood & affect appropriate.     Data Reviewed: I have personally reviewed  following labs and imaging studies  CBC: Recent Labs  Lab 08/29/24 0740 08/30/24 1151  WBC 7.9 10.8*  HGB 14.6 12.8*  HCT 42.6 38.2*  MCV 96.2 96.7  PLT 242 241   Basic Metabolic Panel: Recent Labs  Lab 08/29/24 0740 08/30/24 1151  NA 141 137  K 3.7 4.0  CL 103 102  CO2 28 28  GLUCOSE 109* 117*  BUN 9 12  CREATININE 0.46* 0.59*  CALCIUM  9.3 8.7*   GFR: Estimated Creatinine Clearance: 69.3 mL/min (A) (by C-G formula based on SCr of 0.59 mg/dL (L)). Liver Function Tests: Recent Labs  Lab 08/29/24 0740  AST 28  ALT 29  ALKPHOS 89  BILITOT 0.9  PROT 6.6  ALBUMIN 2.9*   No results for input(s): LIPASE, AMYLASE in the last 168 hours. No results for input(s): AMMONIA in the last 168 hours. Coagulation Profile: No results for input(s): INR, PROTIME in the last 168 hours. Cardiac Enzymes: No results for input(s): CKTOTAL, CKMB, CKMBINDEX, TROPONINI in the last 168 hours. BNP (last 3 results) No results for input(s): PROBNP in the last 8760 hours. HbA1C: No results for input(s): HGBA1C in the last 72 hours. CBG: Recent Labs  Lab 08/29/24 0737  GLUCAP 109*   Lipid Profile: No  results for input(s): CHOL, HDL, LDLCALC, TRIG, CHOLHDL, LDLDIRECT in the last 72 hours. Thyroid  Function Tests: No results for input(s): TSH, T4TOTAL, FREET4, T3FREE, THYROIDAB in the last 72 hours. Anemia Panel: No results for input(s): VITAMINB12, FOLATE, FERRITIN, TIBC, IRON, RETICCTPCT in the last 72 hours. Sepsis Labs: Recent Labs  Lab 08/30/24 1151  PROCALCITON <0.10    Recent Results (from the past 240 hours)  Resp panel by RT-PCR (RSV, Flu A&B, Covid) Anterior Nasal Swab     Status: None   Collection Time: 08/29/24  1:13 PM   Specimen: Anterior Nasal Swab  Result Value Ref Range Status   SARS Coronavirus 2 by RT PCR NEGATIVE NEGATIVE Final   Influenza A by PCR NEGATIVE NEGATIVE Final   Influenza B by PCR NEGATIVE  NEGATIVE Final    Comment: (NOTE) The Xpert Xpress SARS-CoV-2/FLU/RSV plus assay is intended as an aid in the diagnosis of influenza from Nasopharyngeal swab specimens and should not be used as a sole basis for treatment. Nasal washings and aspirates are unacceptable for Xpert Xpress SARS-CoV-2/FLU/RSV testing.  Fact Sheet for Patients: bloggercourse.com  Fact Sheet for Healthcare Providers: seriousbroker.it  This test is not yet approved or cleared by the [place]  FDA and has been authorized for detection and/or diagnosis of SARS-CoV-2 by FDA under an Emergency Use Authorization (EUA). This EUA will remain in effect (meaning this test can be used) for the duration of the COVID-19 declaration under Section 564(b)(1) of the Act, 21 U.S.C. section 360bbb-3(b)(1), unless the authorization is terminated or revoked.     Resp Syncytial Virus by PCR NEGATIVE NEGATIVE Final    Comment: (NOTE) Fact Sheet for Patients: bloggercourse.com  Fact Sheet for Healthcare Providers: seriousbroker.it  This test is not yet approved or cleared by the [place]  FDA and has been authorized for detection and/or diagnosis of SARS-CoV-2 by FDA under an Emergency Use Authorization (EUA). This EUA will remain in effect (meaning this test can be used) for the duration of the COVID-19 declaration under Section 564(b)(1) of the Act, 21 U.S.C. section 360bbb-3(b)(1), unless the authorization is terminated or revoked.  Performed at [facility], [address], [place], [place] [address]      Radiology Studies: CT CHEST WO CONTRAST Result Date: 08/30/2024 EXAM: CT CHEST WITHOUT CONTRAST 08/30/2024 06:19:08 PM TECHNIQUE: CT of the chest was performed without the administration of intravenous contrast. Multiplanar reformatted images are provided for review. Automated exposure control, iterative  reconstruction, and/or weight based adjustment of the mA/kV was utilized to reduce the radiation dose to as low as reasonably achievable. COMPARISON: Chest radiograph of 08/29/2024 and PET CT examination of 08/16/2024. CLINICAL HISTORY: Abnormal xray - lung opacity/opacities. Prostate cancer. *tracking code: Bo* FINDINGS: MEDIASTINUM: Heart: Extensive multivessel coronary artery calcifications. Calcification of the aortic valve leaflets. Global cardiac size within normal limits. No pericardial effusion. The central airways are clear. No central obstructing lesion. Vasculature: Central pulmonary arteries are of normal caliber. Mild atherosclerotic calcification within the thoracic aorta. 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 Recommend annual imaging followup by CTA or MRA. This recommendation follows 2010 ACCF/AHA/AATS/ACR/ASA/SCA/SCAI/SIR/STS/SVM guidelines for the diagnosis and management of patients with thoracic aortic disease. Circulation. 2010; 121: Z733-z630. Aortic aneurysm NOS (ICD10-I71.9). LYMPH NODES: No mediastinal, hilar or axillary lymphadenopathy. LUNGS AND PLEURA: Subpleural reticulation, bronchiolectasis, and architectural distortion is present in keeping with changes of subpleural fibrotic change. There is superimposed ground-glass infiltrate and consolidation within the left lower lobe with  associated traction bronchiectasis. 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 No pneumothorax or pleural effusion. SOFT TISSUES/BONES: Osseous structures are age appropriate. No acute bone abnormality. No lytic or blastic bone lesion. No acute abnormality of the soft tissues. UPPER ABDOMEN: Limited images of the upper abdomen demonstrates no acute abnormality. IMPRESSION: 1. 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 Comparison with prior examinations will be helpful for further evaluation. If none are available, follow-up high-resolution CT imaging of the chest in 6 months would be helpful . 2. 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 Recommend annual imaging follow-up by CTA or MRA. This recommendation follows 2010 ACCF/AHA/AATS/ACR/ASA/SCA/SCAI/SIR/STS/SVM guidelines for the diagnosis and management of patients with thoracic aortic disease. Circulation. 2010;121:E266-E369. Aortic aneurysm NOS (ICD10-I71.9). 3. 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 Electronically signed by: Dorethia Molt MD 08/30/2024 08:45 PM EST RP Workstation: [id number]   DG Chest 2 View Result Date: 08/29/2024 CLINICAL DATA:  Shortness of breath, dizziness, recent fall EXAM: CHEST - 2 VIEW COMPARISON:  08/31/2021 FINDINGS: Chronic elevation of the right hemidiaphragm. Mild peripheral left lung scattered nodular opacities diffusely worse in the lower lobe, suspicious for multifocal left lung pneumonia. Suspect small pleural effusions bilaterally. Stable chronic parenchymal scarring in the right lung. Overall stable heart size and vascularity. No acute edema pattern CHF. Aorta atherosclerotic. Degenerative changes of the spine. IMPRESSION: Peripheral left lung nodular opacities suspicious for multifocal pneumonia. Stable chronic changes. Electronically Signed   By: CHRISTELLA.  Shick M.D.   On: 08/29/2024 12:59   CT Cervical Spine Wo Contrast Result Date: 08/29/2024 EXAM: CT CERVICAL SPINE WITHOUT CONTRAST 08/29/2024 07:05:00 AM TECHNIQUE: CT of the cervical spine was performed without the administration of intravenous contrast. Multiplanar reformatted images are provided for review. Automated exposure control,  iterative reconstruction, and/or weight based adjustment of the mA/kV was utilized to reduce the radiation dose to as low as reasonably achievable. COMPARISON: Head CT 08/29/2024, reported separately. PET CT 08/16/2024. CLINICAL HISTORY: 83 year old male status post fall striking head on bathtub. FINDINGS: CERVICAL SPINE: BONES AND ALIGNMENT: No acute fracture or traumatic malalignment. 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 Superimposed advanced upper cervical facet arthropathy on the right, including vacuum facet arthropathy at multiple levels. DEGENERATIVE CHANGES: No significant cervical spinal stenosis by CT SOFT TISSUES: No prevertebral soft tissue swelling. Bulky calcified carotid bifurcation atherosclerosis in the bilateral neck. TMJ degeneration. LUNGS: Left greater than right abnormal lung apices appear stable from the recent PET CT, more resemble scarring with architectural distortion than acute lung inflammation. LIMITATIONS: Bilateral shoulder arthroplasty mild streak artifact at the thoracic inlet. IMPRESSION: 1. No acute traumatic injury identified in the cervical spine. 2. 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 3. Stable left greater than right apical lung architectural distortion. Electronically signed by: Helayne Hurst MD 08/29/2024 07:14 AM EST RP Workstation: [id number]   CT Head Wo Contrast Result Date: 08/29/2024 EXAM: CT HEAD WITHOUT CONTRAST 08/29/2024 07:05:00 AM TECHNIQUE: CT of the head was performed without the administration of intravenous contrast. Automated exposure control, iterative reconstruction, and/or weight based adjustment of the mA/kV was utilized to reduce the radiation dose to as low as reasonably achievable. COMPARISON: None available.  CLINICAL HISTORY: 83 year old male status post fall striking head on bathtub. FINDINGS: BRAIN AND VENTRICLES: No  acute hemorrhage. No evidence of acute infarct. No hydrocephalus. No extra-axial collection. No mass effect or midline shift. Normal brain volume for age. No suspicious intracranial vascular hyperdensity. Calcified atherosclerosis at the skull base. ORBITS: Postoperative changes to both globes. SINUSES: Paranasal sinuses, middle ears and mastoids are well aerated. SOFT TISSUES AND SKULL: Right posterior convexity scalp hematoma and laceration with confluent soft tissue thickening up to 14 mm and mild soft tissue gas. Underlying calvarium intact. No skull fracture. IMPRESSION: 1. Scalp soft tissue injury. 2. Normal for age non contrast CT appearance of the brain. Electronically signed by: Helayne Hurst MD 08/29/2024 07:09 AM EST RP Workstation: [id number]   DG Hip Unilat W or Wo Pelvis 2-3 Views Right Result Date: 08/29/2024 EXAM: 2 or 3 VIEW(S) XRAY OF THE RIGHT HIP 08/29/2024 06:58:00 AM COMPARISON: MRI 08/07/2024. CLINICAL HISTORY: fall FINDINGS: BONES AND JOINTS: Solid fusion of the pubic symphysis. Sequelae of remote avulsion fracture of the lateral aspect of the right inferior pubic rami. No acute fracture or focal osseous lesion. . Mild bilateral and symmetric degenerative changes involving both hips. SOFT TISSUES: Seed implants identified within the prostate gland. LUMBAR SPINE: Degenerative disc disease noted within the imaged portions of the lower lumbar spine. IMPRESSION: 1. No acute fracture or dislocation. Electronically signed by: Waddell Calk MD 08/29/2024 07:04 AM EST RP Workstation: [id number]    Scheduled Meds: . amLODipine   5 mg Oral Daily  . atorvastatin   80 mg Oral Daily  . azithromycin   500 mg Oral Daily  . buPROPion   300 mg Oral Daily  . enoxaparin (LOVENOX) injection  40 mg Subcutaneous Q24H  . fluticasone furoate-vilanterol  1 puff Inhalation Daily  . losartan   100 mg Oral Daily   Continuous Infusions: . cefTRIAXone (ROCEPHIN)  IV 1 g (08/30/24 0844)     LOS:  LOS: 1 day    Time Spent: 45 minutes  Unresulted Labs (From admission, onward)     Start     Ordered   09/05/24 0500  Creatinine, serum  (enoxaparin (LOVENOX)    CrCl >/= 30 ml/min)  Weekly,   R     Comments: while on enoxaparin therapy    08/29/24 1339   08/30/24 1106  CBC  Daily,   R      08/30/24 1105   08/30/24 1106  Basic metabolic panel with GFR  Daily,   R      08/30/24 1105             Tyrez Berrios Al-Sultani, MD [facility]  If 7PM-7AM, please contact night-coverage  08/30/2024, 10:33 PM

## 2024-08-30 NOTE — Progress Notes (Addendum)
 PROGRESS NOTE    Austin [name]  FMW:[id number] DOB: 19-Sep-1941 DOA: 08/29/2024 PCP: [name], MD    Brief Narrative:  The patient is an 83 year old male with PMHx of asthma/COPD overlap syndrome, chronic rhinitis, recent oral thrush treated with fluconazole , HTN, HLD, depression, prostate cancer who presented to the ED on 08/29/2024 after an episode of dizziness and subsequent fall with trauma to the right side of head leading to a right parietal laceration, without loss of consciousness.  On initial evaluation in the ED, he reportedly denied lightheadedness or dizziness prior to falling, though he later endorsed several weeks of dizziness and unsteadiness while ambulating, requiring the use of a walker, and was later noted to be dizzy and unable to ambulate in the ED.  MRI was deferred at that time.  In the ED, he was afebrile with a temp of 98.6 F, HR 97, RR 19, BP 135/85, SpO2 99% on RA.  CBC was unremarkable with no leukocytosis.  CMP showed albumin of 2.9, lower than longstanding baseline.  UA was negative for UTI findings, showed moderate Hgb in the setting of known prostate cancer.  COVID/flu/RSV negative.  CT head showed scalp soft tissue injury.  CT cervical spine showed no acute traumatic injury.  He was noted to have crackles at the left lower lobe with CXR findings of peripheral left lung nodular opacity suspicious for multifocal pneumonia, for which he was started on Rocephin  and azithromycin  and placed up for admission. Notably, patient has been experiencing dysphagia with solids and dysarthria for approximately 3 weeks now, which were thought to be associated with oral thrust in the setting of Breztri  use, for which he was treated with two consecutive 7-day courses of fluconazole  by his outpatient pulmonologist with discontinuation of Breztri  in favor of lower dose Advair . Further, he has unintentional weight loss of approximately 26 lbs over the last 3 months.    Assessment and  Plan:  # Stroke rule out # Dysphagia # Dysarthria # Dizziness # Ataxia - Patient presents with multiple falls over last 3-4 weeks with new onset unsteadiness while ambulating necessitating the use of a walker, associated with some dizziness. Additionally, he has noted dysphagia and dysarthria around the same period of time, initially attributed to thrush but not resolving with treatment with 14 days of fluconazole  - Ordered MRI to rule out stroke - SLP following for dysphagia - recommended dysphagia-2 with thin liquids - SLP reconsulted for language evaluation given dysarthria - PT/OT following - recommending SNF for rehab  # Possible CAP - In the ED, noted to have LLL crackles on physical exam with CXR findings of peripheral left lung nodular opacities suggestive of multifocal pneumonia.  Was started on IV Rocephin  and azithromycin  and admitted for CAP - However, patient is afebrile with no leukocytosis and no cough.  Clinically, very low suspicion for pneumonia. - Procalcitonin <0.1, further supporting unlikely diagnosis of bacterial pneumonia - Of note, the patient recently underwent a PET scan on 08/20/2024 which noted patchy ground glass densities in both lungs suggestive of edema or inflammation.  Outpatient pulmonology ordered a high-resolution CT scan for further characterization of the pulmonary findings on PET scan, which was scheduled on the same day of his admission - Will continue IV Rocephin  and azithromycin  for now - Ordered CT chest for better characterization of LLL rales and CXR findings  # Ground-level falls # Right parietal scalp laceration and hematoma - Presented after GLF in the bathroom, reportedly due to loss of balance,  with trauma to head - CT head showed right posterior convexity scalp hematoma and laceration with confluent soft tissue thickening and mild soft tissue gas, underlying calvarium intact with no skull fracture. 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 with advanced upper cervical facet arthropathy - Underwent laceration repair in the ED  # History prostate cancer # Unintentional weight loss - Per review of records, the patient has lost ~ 26 lbs when comparing recorded weights on record from 8/6 and 11/12 - This is somewhat concerning in the setting of history prostate cancer - PET scan 11/12 showed intense intraprostatic activity favored to represent radioactive urine within the prostatic urethra, no evidence of metastatic adenopathy in the abdomen and pelvis, no distant metastatic prostate cancer noted. - RD consulted for evaluation  # Asthma/COPD overlap syndrome - Follows closely with Dr. Meade in outpatient pulmonology - Continue home Advair  HFA with formulary alternative Breo Ellipta  - PRN DuoNebs  # Chronic rhinitis - Continue home Singulair  10 mg qhs #Hypertension - Continue home amlodipine  5 mg daily and losartan  100 mg daily  #MDD - Continue home Wellbutrin  300 mg daily  #HLD - Continue home Lipitor 80 mg daily  # GERD - Continue PPI formulary alternative   DVT prophylaxis: enoxaparin  (LOVENOX ) injection 40 mg Start: 08/29/24 1400   Code Status:   Code Status: Full Code  Family Communication: Discussed with patient's wife, [name], at bedside  Disposition Plan: SNF for rehab pending completion of workup  PT - Follow Up Recommendations: Skilled nursing-short term rehab (<3 hours/day) OT - Follow Up Recommendations: Skilled nursing-short term rehab (<3 hours/day)  DME Needs: PT equipment: BSC/3in1      Level of care: Med-Surg  Consultants:  None  Procedures:  None  Antimicrobials: Rocephin  and azithromycin    Subjective: Patient evaluated at bedside today.  Patient is dysarthric with difficult to understand speech.  However, he continues to report lower extremity weakness and dizziness, inability to ambulate.  Has some shortness of breath  but denies any productive cough, fevers, chills, chest pain.  Objective: Vitals:   08/30/24 0623 08/30/24 1013 08/30/24 1803 08/30/24 2041  BP: 119/73 113/67 112/73 119/64  Pulse: 95 96 (!) 102 91  Resp: 16 17 18 17   Temp: 98.6 F (37 C) (!) 97.5 F (36.4 C) 97.7 F (36.5 C) 97.7 F (36.5 C)  TempSrc: Oral Oral Oral   SpO2: 96% 97% 95% 96%  Weight:      Height:        Intake/Output Summary (Last 24 hours) at 08/30/2024 2233 Last data filed at 08/30/2024 1000 Gross per 24 hour  Intake 240 ml  Output 600 ml  Net -360 ml   Filed Weights   08/29/24 1524  Weight: 70 kg    Examination:  Gen: NAD, A&Ox3 HEENT: NCAT, EOMI Neck: Supple, no JVD, no LAD CV: RRR, no murmurs Resp: normal WOB,  rales noted at left lung base, other lung field clear to ausculation. Abd: Soft, NTND, no guarding, BS normoactive Ext: No LE edema, pulses 2+ b/l Skin: Warm, dry, no rashes/lesions Neuro: Speech appear dysarthric, 4/5 BLE strength, 5/5 BUE strength, gait testing deferred Psych: Calm, cooperative, appropriate affect   Data Reviewed: I have personally reviewed following labs and imaging studies  CBC: Recent Labs  Lab 08/29/24 0740 08/30/24 1151  WBC 7.9 10.8*  HGB 14.6 12.8*  HCT 42.6 38.2*  MCV 96.2 96.7  PLT 242 241   Basic Metabolic  Panel: Recent Labs  Lab 08/29/24 0740 08/30/24 1151  NA 141 137  K 3.7 4.0  CL 103 102  CO2 28 28  GLUCOSE 109* 117*  BUN 9 12  CREATININE 0.46* 0.59*  CALCIUM  9.3 8.7*   GFR: Estimated Creatinine Clearance: 69.3 mL/min (A) (by C-G formula based on SCr of 0.59 mg/dL (L)). Liver Function Tests: Recent Labs  Lab 08/29/24 0740  AST 28  ALT 29  ALKPHOS 89  BILITOT 0.9  PROT 6.6  ALBUMIN 2.9*   No results for input(s): LIPASE, AMYLASE in the last 168 hours. No results for input(s): AMMONIA in the last 168 hours. Coagulation Profile: No results for input(s): INR, PROTIME in the last 168 hours. Cardiac Enzymes: No  results for input(s): CKTOTAL, CKMB, CKMBINDEX, TROPONINI in the last 168 hours. BNP (last 3 results) No results for input(s): PROBNP in the last 8760 hours. HbA1C: No results for input(s): HGBA1C in the last 72 hours. CBG: Recent Labs  Lab 08/29/24 0737  GLUCAP 109*   Lipid Profile: No results for input(s): CHOL, HDL, LDLCALC, TRIG, CHOLHDL, LDLDIRECT in the last 72 hours. Thyroid  Function Tests: No results for input(s): TSH, T4TOTAL, FREET4, T3FREE, THYROIDAB in the last 72 hours. Anemia Panel: No results for input(s): VITAMINB12, FOLATE, FERRITIN, TIBC, IRON, RETICCTPCT in the last 72 hours. Sepsis Labs: Recent Labs  Lab 08/30/24 1151  PROCALCITON <0.10    Recent Results (from the past 240 hours)  Resp panel by RT-PCR (RSV, Flu A&B, Covid) Anterior Nasal Swab     Status: None   Collection Time: 08/29/24  1:13 PM   Specimen: Anterior Nasal Swab  Result Value Ref Range Status   SARS Coronavirus 2 by RT PCR NEGATIVE NEGATIVE Final   Influenza A by PCR NEGATIVE NEGATIVE Final   Influenza B by PCR NEGATIVE NEGATIVE Final    Comment: (NOTE) The Xpert Xpress SARS-CoV-2/FLU/RSV plus assay is intended as an aid in the diagnosis of influenza from Nasopharyngeal swab specimens and should not be used as a sole basis for treatment. Nasal washings and aspirates are unacceptable for Xpert Xpress SARS-CoV-2/FLU/RSV testing.  Fact Sheet for Patients: bloggercourse.com  Fact Sheet for Healthcare Providers: seriousbroker.it  This test is not yet approved or cleared by the [place]  FDA and has been authorized for detection and/or diagnosis of SARS-CoV-2 by FDA under an Emergency Use Authorization (EUA). This EUA will remain in effect (meaning this test can be used) for the duration of the COVID-19 declaration under Section 564(b)(1) of the Act, 21 U.S.C. section 360bbb-3(b)(1), unless  the authorization is terminated or revoked.     Resp Syncytial Virus by PCR NEGATIVE NEGATIVE Final    Comment: (NOTE) Fact Sheet for Patients: bloggercourse.com  Fact Sheet for Healthcare Providers: seriousbroker.it  This test is not yet approved or cleared by the [place]  FDA and has been authorized for detection and/or diagnosis of SARS-CoV-2 by FDA under an Emergency Use Authorization (EUA). This EUA will remain in effect (meaning this test can be used) for the duration of the COVID-19 declaration under Section 564(b)(1) of the Act, 21 U.S.C. section 360bbb-3(b)(1), unless the authorization is terminated or revoked.  Performed at [facility], [address] [place], [place] [address]      Radiology Studies: CT CHEST WO CONTRAST Result Date: 08/30/2024 EXAM: CT CHEST WITHOUT CONTRAST 08/30/2024 06:19:08 PM TECHNIQUE: CT of the chest was performed without the administration of intravenous contrast. Multiplanar reformatted images are provided for review. Automated exposure control,  iterative reconstruction, and/or weight based adjustment of the mA/kV was utilized to reduce the radiation dose to as low as reasonably achievable. COMPARISON: Chest radiograph of 08/29/2024 and PET CT examination of 08/16/2024. CLINICAL HISTORY: Abnormal xray - lung opacity/opacities. Prostate cancer. *tracking code: Bo* FINDINGS: MEDIASTINUM: Heart: Extensive multivessel coronary artery calcifications. Calcification of the aortic valve leaflets. Global cardiac size within normal limits. No pericardial effusion. The central airways are clear. No central obstructing lesion. Vasculature: Central pulmonary arteries are of normal caliber. Mild atherosclerotic calcification within the thoracic aorta. 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 Recommend annual imaging followup by CTA or MRA. This  recommendation follows 2010 ACCF/AHA/AATS/ACR/ASA/SCA/SCAI/SIR/STS/SVM guidelines for the diagnosis and management of patients with thoracic aortic disease. Circulation. 2010; 121: Z733-z630. Aortic aneurysm NOS (ICD10-I71.9). LYMPH NODES: No mediastinal, hilar or axillary lymphadenopathy. LUNGS AND PLEURA: Subpleural reticulation, bronchiolectasis, and architectural distortion is present in keeping with changes of subpleural fibrotic change. There is superimposed ground-glass infiltrate and consolidation within the left lower lobe with associated traction bronchiectasis. 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 No pneumothorax or pleural effusion. SOFT TISSUES/BONES: Osseous structures are age appropriate. No acute bone abnormality. No lytic or blastic bone lesion. No acute abnormality of the soft tissues. UPPER ABDOMEN: Limited images of the upper abdomen demonstrates no acute abnormality. IMPRESSION: 1. 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 Comparison with prior examinations will be helpful for further evaluation. If none are available, follow-up high-resolution CT imaging of the chest in 6 months would be helpful . 2. 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 Recommend annual imaging follow-up by CTA or MRA. This recommendation follows 2010 ACCF/AHA/AATS/ACR/ASA/SCA/SCAI/SIR/STS/SVM guidelines for the diagnosis and management of patients with thoracic aortic disease. Circulation. 2010;121:E266-E369. Aortic aneurysm NOS (ICD10-I71.9). 3. 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 Electronically signed by: Dorethia Molt MD  08/30/2024 08:45 PM EST RP Workstation: [id number]   DG Chest 2 View Result Date: 08/29/2024 CLINICAL DATA:  Shortness of breath, dizziness, recent fall EXAM: CHEST - 2 VIEW COMPARISON:  08/31/2021 FINDINGS: Chronic elevation of the right hemidiaphragm. Mild peripheral left lung scattered nodular opacities diffusely worse in the lower lobe, suspicious for multifocal left lung pneumonia. Suspect small pleural effusions bilaterally. Stable chronic parenchymal scarring in the right lung. Overall stable heart size and vascularity. No acute edema pattern CHF. Aorta atherosclerotic. Degenerative changes of the spine. IMPRESSION: Peripheral left lung nodular opacities suspicious for multifocal pneumonia. Stable chronic changes. Electronically Signed   By: CHRISTELLA.  Shick M.D.   On: 08/29/2024 12:59   CT Cervical Spine Wo Contrast Result Date: 08/29/2024 EXAM: CT CERVICAL SPINE WITHOUT CONTRAST 08/29/2024 07:05:00 AM TECHNIQUE: CT of the cervical spine was performed without the administration of intravenous contrast. Multiplanar reformatted images are provided for review. Automated exposure control, iterative reconstruction, and/or weight based adjustment of the mA/kV was utilized to reduce the radiation dose to as low as reasonably achievable. COMPARISON: Head CT 08/29/2024, reported separately. PET CT 08/16/2024. CLINICAL HISTORY: 83 year old male status post fall striking head on bathtub. FINDINGS: CERVICAL SPINE: BONES AND ALIGNMENT: No acute fracture or traumatic malalignment. 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 Superimposed advanced upper cervical facet  arthropathy on the right, including vacuum facet arthropathy at multiple levels. DEGENERATIVE CHANGES: No significant cervical spinal stenosis by CT SOFT TISSUES: No prevertebral soft tissue swelling. Bulky calcified carotid bifurcation atherosclerosis in the bilateral neck. TMJ  degeneration. LUNGS: Left greater than right abnormal lung apices appear stable from the recent PET CT, more resemble scarring with architectural distortion than acute lung inflammation. LIMITATIONS: Bilateral shoulder arthroplasty mild streak artifact at the thoracic inlet. IMPRESSION: 1. No acute traumatic injury identified in the cervical spine. 2. 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 3. Stable left greater than right apical lung architectural distortion. Electronically signed by: Helayne Hurst MD 08/29/2024 07:14 AM EST RP Workstation: [id number]   CT Head Wo Contrast Result Date: 08/29/2024 EXAM: CT HEAD WITHOUT CONTRAST 08/29/2024 07:05:00 AM TECHNIQUE: CT of the head was performed without the administration of intravenous contrast. Automated exposure control, iterative reconstruction, and/or weight based adjustment of the mA/kV was utilized to reduce the radiation dose to as low as reasonably achievable. COMPARISON: None available. CLINICAL HISTORY: 83 year old male status post fall striking head on bathtub. FINDINGS: BRAIN AND VENTRICLES: No acute hemorrhage. No evidence of acute infarct. No hydrocephalus. No extra-axial collection. No mass effect or midline shift. Normal brain volume for age. No suspicious intracranial vascular hyperdensity. Calcified atherosclerosis at the skull base. ORBITS: Postoperative changes to both globes. SINUSES: Paranasal sinuses, middle ears and mastoids are well aerated. SOFT TISSUES AND SKULL: Right posterior convexity scalp hematoma and laceration with confluent soft tissue thickening up to 14 mm and mild soft tissue gas. Underlying calvarium intact. No skull fracture. IMPRESSION: 1. Scalp soft tissue injury. 2. Normal for age non contrast CT appearance of the brain. Electronically signed by: Helayne Hurst MD 08/29/2024 07:09 AM EST RP Workstation: [id number]   DG Hip Unilat W or Wo Pelvis 2-3 Views Right Result Date:  08/29/2024 EXAM: 2 or 3 VIEW(S) XRAY OF THE RIGHT HIP 08/29/2024 06:58:00 AM COMPARISON: MRI 08/07/2024. CLINICAL HISTORY: fall FINDINGS: BONES AND JOINTS: Solid fusion of the pubic symphysis. Sequelae of remote avulsion fracture of the lateral aspect of the right inferior pubic rami. No acute fracture or focal osseous lesion. . Mild bilateral and symmetric degenerative changes involving both hips. SOFT TISSUES: Seed implants identified within the prostate gland. LUMBAR SPINE: Degenerative disc disease noted within the imaged portions of the lower lumbar spine. IMPRESSION: 1. No acute fracture or dislocation. Electronically signed by: Waddell Calk MD 08/29/2024 07:04 AM EST RP Workstation: [id number]    Scheduled Meds:  amLODipine   5 mg Oral Daily   atorvastatin   80 mg Oral Daily   azithromycin   500 mg Oral Daily   buPROPion   300 mg Oral Daily   enoxaparin  (LOVENOX ) injection  40 mg Subcutaneous Q24H   fluticasone  furoate-vilanterol  1 puff Inhalation Daily   losartan   100 mg Oral Daily   Continuous Infusions:  cefTRIAXone  (ROCEPHIN )  IV 1 g (08/30/24 0844)     LOS:  LOS: 1 day   Time Spent: 45 minutes  Unresulted Labs (From admission, onward)     Start     Ordered   09/05/24 0500  Creatinine, serum  (enoxaparin  (LOVENOX )    CrCl >/= 30 ml/min)  Weekly,   R     Comments: while on enoxaparin  therapy    08/29/24 1339   08/30/24 1106  CBC  Daily,   R      08/30/24 1105   08/30/24 1106  Basic metabolic panel with GFR  Daily,  R      08/30/24 1105             Aziza Stuckert Al-Sultani, MD [facility]  If 7PM-7AM, please contact night-coverage  08/30/2024, 10:33 PM

## 2024-08-30 NOTE — Evaluation (Signed)
 Occupational Therapy Evaluation Patient Details Name: Austin [name] MRN: [id number] DOB: 06-May-1941 Today's Date: 08/30/2024   History of Present Illness   Austin [name] is a 83 y.o. male admitted 08/29/24 after fall at home in the bathroom d/t loss of balance resulting in striking the right side of his head against the bathtub and his right hip on the floor. Pt sustained a 7 cm laceration to right parietal scalp, not actively bleeding. CT of head/C-spine without acute traumatic injury. R hip X-ray unremarkable. Chest x-ray shows left lung nodule opacity suspicious for multifocal pneumonia. PMHx: HTN, depression, cervical disc disease, prostate cancer, chronic back pain, COPD, and urinary incontinence.     Clinical Impressions Pt admitted with the above concerns. Pt currently with functional limitations due to the deficits listed below (see OT Problem List). At baseline, pt lives with his wife at home and is independent/mod I for ADL tasks. Pt continues to drive and is able to perform medication management. Pt will benefit from acute skilled OT to increase their safety and independence with ADL and functional mobility for ADL to facilitate discharge. Patient will benefit from continued inpatient follow up therapy, <3 hours/day.       If plan is discharge home, recommend the following:   A lot of help with walking and/or transfers;A lot of help with bathing/dressing/bathroom;Assist for transportation     Functional Status Assessment   Patient has had a recent decline in their functional status and demonstrates the ability to make significant improvements in function in a reasonable and predictable amount of time.     Equipment Recommendations   None recommended by OT      Precautions/Restrictions   Precautions Precautions: Fall Recall of Precautions/Restrictions: Impaired Precaution/Restrictions Comments: Watch BP Restrictions Weight Bearing Restrictions Per Provider  Order: No     Mobility Bed Mobility Overal bed mobility: Needs Assistance Bed Mobility: Sit to Supine       Sit to supine: Contact guard assist   General bed mobility comments: Increased time to completed.    Transfers Overall transfer level: Needs assistance Equipment used: Rolling walker (2 wheels) Transfers: Sit to/from Stand, Bed to chair/wheelchair/BSC Sit to Stand: Min assist     Step pivot transfers: Mod assist     General transfer comment: No cues for hand placement prior to standing. Once up, pt demonstrated forward flexed trunk with posterior bias. VC for safety and sequencing with use of RW.      Balance Overall balance assessment: Needs assistance, History of Falls Sitting-balance support: Bilateral upper extremity supported, Feet supported Sitting balance-Leahy Scale: Fair   Postural control: Posterior lean Standing balance support: Bilateral upper extremity supported, During functional activity, Reliant on assistive device for balance Standing balance-Leahy Scale: Poor Standing balance comment: Pt dependent on RW and external support of OT            ADL either performed or assessed with clinical judgement   ADL Overall ADL's : Needs assistance/impaired     Grooming: Set up;Sitting   Upper Body Bathing: Minimal assistance;Sitting   Lower Body Bathing: Maximal assistance;Sit to/from stand   Upper Body Dressing : Moderate assistance;Sitting   Lower Body Dressing: Total assistance;Sit to/from stand   Toilet Transfer: Moderate assistance;Cueing for safety;Cueing for sequencing;Ambulation   Toileting- Clothing Manipulation and Hygiene: Moderate assistance;Cueing for safety;Cueing for sequencing;Sit to/from stand        Vision Baseline Vision/History: 1 Wears glasses Ability to See in Adequate Light: 0 Adequate Patient Visual Report: No change from  baseline Vision Assessment?: No apparent visual deficits     Perception Perception: Not  tested       Praxis Praxis: Not tested       Pertinent Vitals/Pain Pain Assessment Pain Assessment: Faces Faces Pain Scale: Hurts little more Pain Location: right hip Pain Descriptors / Indicators: Aching, Grimacing Pain Intervention(s): Monitored during session     Extremity/Trunk Assessment Upper Extremity Assessment Upper Extremity Assessment: Right hand dominant;RUE deficits/detail;LUE deficits/detail RUE Deficits / Details: history of RTC replacement. A/ROM shoulder flexion/abduction to 90 degrees or just under. Able to demonstrate functional IR/er ROM. Wife reports ROM impairs ability to put in hearing aids. MMT: shoulder flexion: 3-/5, abduction 3-/5, IR/er 3+/5 LUE Deficits / Details: history of RTC replacement. A/ROM shoulder flexion/abduction to 90 degrees or just under. Able to demonstrate functional IR/er ROM. Wife reports ROM impairs ability to put in hearing aids. 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 with advanced upper cervical facet arthropathy   Communication Communication Communication: Impaired Factors Affecting Communication: Reduced clarity of speech;Hearing impaired   Cognition Arousal: Alert Behavior During Therapy: WFL for tasks assessed/performed Cognition: Cognition impaired   Orientation impairments: Person, Place Awareness: Intellectual awareness impaired, Online awareness impaired Memory impairment (select all impairments): Short-term memory Attention impairment (select first level of impairment): Selective attention Executive functioning impairment (select all impairments): Organization, Reasoning, Problem solving OT - Cognition Comments: Difficulty with recalling recent events such as how many falls he's experienced, what caused his most recent  fall, etc. Wife was able to provide accurate information.    Following commands: Intact       Cueing  General Comments   Cueing Techniques: Verbal cues;Gestural cues  Pt reported feeling heavy while standing.           Home Living Family/patient expects to be discharged to:: Private residence Living Arrangements: Spouse/significant other Available Help at Discharge: Family;Available 24 hours/day (Wife unsure of the level of physical assist she could provide the pt.) Type of Home: House Home Access: Level entry     Home Layout: Two level;Able to live on main level with bedroom/bathroom;Full bath on main level Alternate Level Stairs-Number of Steps: 15 Alternate Level Stairs-Rails: Right;Left;Can reach both Bathroom Shower/Tub: Producer, Television/film/video: Standard     Home Equipment: Shower seat - built in;Cane - single Librarian, Academic (2 wheels);Rollator (4 wheels);Grab bars - toilet;Grab bars - tub/shower          Prior Functioning/Environment Prior Level of Function : Independent/Modified Independent;Driving;History of Falls (last six months)  Mobility Comments: Ambulates using RW. 1 fall leading to admit. ADLs Comments: Indep with ADLs. Manages his own meds. Drives.    OT Problem List: Decreased strength;Decreased activity tolerance;Impaired balance (sitting and/or standing);Decreased safety awareness;Impaired UE functional use   OT Treatment/Interventions: Self-care/ADL training;Therapeutic exercise;DME and/or AE instruction;Therapeutic activities;Patient/family education;Balance training      OT Goals(Current goals can be found in the care plan section)   Acute Rehab OT Goals Patient Stated Goal: to get into bed OT Goal Formulation: With patient Time For Goal Achievement: 09/13/24 Potential to Achieve Goals: Good   OT Frequency:  Min 2X/week       AM-PAC OT 6 Clicks Daily Activity     Outcome Measure Help from another person eating  meals?: None Help from another person taking care of  personal grooming?: A Little Help from another person toileting, which includes using toliet, bedpan, or urinal?: A Little Help from another person bathing (including washing, rinsing, drying)?: A Little Help from another person to put on and taking off regular upper body clothing?: A Little Help from another person to put on and taking off regular lower body clothing?: A Lot 6 Click Score: 18   End of Session Equipment Utilized During Treatment: Gait belt;Rolling walker (2 wheels)  Activity Tolerance: Patient tolerated treatment well Patient left: in bed;with call bell/phone within reach;with bed alarm set;with family/visitor present  OT Visit Diagnosis: Unsteadiness on feet (R26.81);Repeated falls (R29.6);Muscle weakness (generalized) (M62.81)                Time: 8466-8448 OT Time Calculation (min): 18 min Charges:  OT General Charges $OT Visit: 1 Visit OT Evaluation $OT Eval Moderate Complexity: 1 Mod  [employer], OTR/L,CBIS  Supplemental OT - [facility] and [facility] Secure Chat Preferred    Austin [name], Austin [name] 08/30/2024, 4:24 PM

## 2024-08-30 NOTE — Evaluation (Signed)
 Physical Therapy Evaluation Patient Details Name: Austin [name] MRN: [id number] DOB: 1941/08/11 Today's Date: 08/30/2024  History of Present Illness  Austin [name] is a 83 y.o. male admitted 08/29/24 after fall at home in the bathroom d/t loss of balance resulting in striking the right side of his head against the bathtub and his right hip on the floor. Pt sustained a 7 cm laceration to right parietal scalp, not actively bleeding. CT of head/C-spine without acute traumatic injury. R hip X-ray unremarkable. Chest x-ray shows left lung nodule opacity suspicious for multifocal pneumonia. PMHx: HTN, depression, cervical disc disease, prostate cancer, chronic back pain, COPD, and urinary incontinence.   Clinical Impression  Pt admitted with above diagnosis. PTA, pt was modI for functional mobility using RW, independent with ADLs, and driving. He lives with his wife in a two story house with a level entry. Pt reports his is able to reside on the main level. Pt currently with functional limitations due to the deficits listed below (see PT Problem List). He required min-modA for bed mobility and min-modA for transfer using RW. Pt is currently limited by symptomatic orthostatic hypotension, pain, generalized weakness, impaired balance, and decreased activity tolerance. Pt will benefit from acute skilled PT to increase his independence and safety with mobility to allow discharge. Recommend continued inpatient follow up therapy, <3 hours/day.     08/30/24 1421  Vital Signs  Patient Position (if appropriate) Orthostatic Vitals  Orthostatic Lying   BP- Lying 121/68  Pulse- Lying 97  Orthostatic Sitting  BP- Sitting (!) 131/119 (Pt c/o dizziness upon sitting up)  Pulse- Sitting 104  Orthostatic Standing at 0 minutes  BP- Standing at 0 minutes 92/56 (Pt c/o worsening dizziness upon standing)  Pulse- Standing at 0 minutes 108  Orthostatic Standing at 3 minutes  BP- Standing at 3 minutes  (Pt unable to  maintain static stance d/t worsening symptoms)         If plan is discharge home, recommend the following: A lot of help with walking and/or transfers;A lot of help with bathing/dressing/bathroom;Assistance with cooking/housework;Assist for transportation;Help with stairs or ramp for entrance   Can travel by private vehicle   No    Equipment Recommendations BSC/3in1  Recommendations for Other Services       Functional Status Assessment Patient has had a recent decline in their functional status and demonstrates the ability to make significant improvements in function in a reasonable and predictable amount of time.     Precautions / Restrictions Precautions Precautions: Fall Recall of Precautions/Restrictions: Impaired Precaution/Restrictions Comments: Watch BP Restrictions Weight Bearing Restrictions Per Provider Order: No      Mobility  Bed Mobility Overal bed mobility: Needs Assistance Bed Mobility: Supine to Sit     Supine to sit: Min assist, Mod assist, HOB elevated     General bed mobility comments: Pt sat up on R side of bed with increased time. Assist to bring BLE off EOB, elevate trunk, and scoot hips fwd with use of bed pad.    Transfers Overall transfer level: Needs assistance Equipment used: Rolling walker (2 wheels) Transfers: Sit to/from Stand, Bed to chair/wheelchair/BSC Sit to Stand: Min assist   Step pivot transfers: Min assist, Mod assist       General transfer comment: Pt stood from lowest bed height. Cued proper hand placement. Pt powered up with minA to stand. Increased time to reach upright posture. Cues for increase quad activation to improve B knee ext. Transferred to recliner chair positioned close by  on his right by taking short slow steps with limited foot clearence. Assist to manuever RW. Fair eccentric control.    Ambulation/Gait               General Gait Details: Deferred d/t symptomatic orthostatic hypotension.  Stairs             Wheelchair Mobility     Tilt Bed    Modified Rankin (Stroke Patients Only)       Balance Overall balance assessment: Needs assistance, History of Falls Sitting-balance support: Bilateral upper extremity supported, Feet supported Sitting balance-Leahy Scale: Fair Sitting balance - Comments: Pt sat EOB with CGA-minA d/t posterior lean. Postural control: Posterior lean Standing balance support: Bilateral upper extremity supported, During functional activity, Reliant on assistive device for balance Standing balance-Leahy Scale: Poor Standing balance comment: Pt dependent on RW and external support of PT                             Pertinent Vitals/Pain Pain Assessment Pain Assessment: Faces Faces Pain Scale: Hurts little more Pain Location: Head Pain Descriptors / Indicators: Headache Pain Intervention(s): Monitored during session, Limited activity within patient's tolerance, Repositioned    Home Living Family/patient expects to be discharged to:: Private residence Living Arrangements: Spouse/significant other Available Help at Discharge: Family;Available 24 hours/day (Wife unsure of the level of physical assist she could provide the pt.) Type of Home: House Home Access: Level entry     Alternate Level Stairs-Number of Steps: 15 Home Layout: Two level;Able to live on main level with bedroom/bathroom;Full bath on main level Home Equipment: Shower seat - built in;Cane - single Librarian, Academic (2 wheels);Rollator (4 wheels);Grab bars - toilet;Grab bars - tub/shower      Prior Function Prior Level of Function : Independent/Modified Independent;Driving;History of Falls (last six months)             Mobility Comments: Ambulates using RW. 1 fall leading to admit. ADLs Comments: Indep with ADLs. Manages his own meds. Drives.     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 Pt c/o worsening dizziness and lightheadedness with position changes.    Exercises     Assessment/Plan    PT Assessment Patient needs continued PT services  PT Problem List Decreased strength;Decreased activity tolerance;Decreased balance;Decreased mobility;Decreased safety awareness;Cardiopulmonary status limiting activity       PT Treatment Interventions DME instruction;Gait training;Functional mobility training;Therapeutic activities;Therapeutic exercise;Balance training;Patient/family education    PT Goals (Current goals can be found in the Care Plan section)  Acute Rehab PT Goals Patient Stated Goal: Get stronger to safely return Home PT Goal Formulation: With  patient/family Time For Goal Achievement: 09/13/24 Potential to Achieve Goals: Good    Frequency Min 2X/week     Co-evaluation               AM-PAC PT 6 Clicks Mobility  Outcome Measure Help needed turning from your back to your side while in a flat bed without using bedrails?: A Little Help needed moving from lying on your back to sitting on the side of a flat bed without using bedrails?: A Little Help needed moving to and from a bed to  a chair (including a wheelchair)?: A Lot Help needed standing up from a chair using your arms (e.g., wheelchair or bedside chair)?: A Little Help needed to walk in hospital room?: Total Help needed climbing 3-5 steps with a railing? : Total 6 Click Score: 13    End of Session Equipment Utilized During Treatment: Gait belt Activity Tolerance: Treatment limited secondary to medical complications (Comment) (symptomatic orthostatic hypotension) Patient left: in chair;with call bell/phone within reach;with chair alarm set;with family/visitor present Nurse Communication: Mobility status;Other (comment) (Pt's BP response during session) PT Visit Diagnosis: Difficulty in walking, not elsewhere classified (R26.2);Muscle weakness (generalized) (M62.81);Other abnormalities of gait and mobility (R26.89);Unsteadiness on feet (R26.81)    Time: 8589-8567 PT Time Calculation (min) (ACUTE ONLY): 22 min   Charges:   PT Evaluation $PT Eval Moderate Complexity: 1 Mod   PT General Charges $$ ACUTE PT VISIT: 1 Visit         Randall SAUNDERS, PT, DPT Acute Rehabilitation Services Office: [phone] Secure Chat Preferred  Austin [name] 08/30/2024, 3:39 PM

## 2024-08-31 ENCOUNTER — Inpatient Hospital Stay (HOSPITAL_COMMUNITY)

## 2024-08-31 ENCOUNTER — Encounter (HOSPITAL_COMMUNITY): Payer: Self-pay | Admitting: Hospitalist

## 2024-08-31 DIAGNOSIS — R42 Dizziness and giddiness: Secondary | ICD-10-CM | POA: Diagnosis not present

## 2024-08-31 DIAGNOSIS — E43 Unspecified severe protein-calorie malnutrition: Secondary | ICD-10-CM | POA: Insufficient documentation

## 2024-08-31 DIAGNOSIS — R471 Dysarthria and anarthria: Secondary | ICD-10-CM | POA: Diagnosis not present

## 2024-08-31 LAB — CBC
HCT: 39.8 % (ref 39.0–52.0)
Hemoglobin: 13.3 g/dL (ref 13.0–17.0)
MCH: 32.2 pg (ref 26.0–34.0)
MCHC: 33.4 g/dL (ref 30.0–36.0)
MCV: 96.4 fL (ref 80.0–100.0)
Platelets: 213 K/uL (ref 150–400)
RBC: 4.13 MIL/uL — ABNORMAL LOW (ref 4.22–5.81)
RDW: 14.2 % (ref 11.5–15.5)
WBC: 9.6 K/uL (ref 4.0–10.5)
nRBC: 0 % (ref 0.0–0.2)

## 2024-08-31 LAB — BASIC METABOLIC PANEL WITH GFR
Anion gap: 8 (ref 5–15)
BUN: 11 mg/dL (ref 8–23)
CO2: 27 mmol/L (ref 22–32)
Calcium: 8.6 mg/dL — ABNORMAL LOW (ref 8.9–10.3)
Chloride: 103 mmol/L (ref 98–111)
Creatinine, Ser: 0.54 mg/dL — ABNORMAL LOW (ref 0.61–1.24)
GFR, Estimated: >60 mL/min
Glucose, Bld: 95 mg/dL (ref 70–99)
Potassium: 3.9 mmol/L (ref 3.5–5.1)
Sodium: 138 mmol/L (ref 135–145)

## 2024-08-31 LAB — VITAMIN B12: Vitamin B-12: 891 pg/mL (ref 180–914)

## 2024-08-31 LAB — FOLATE: Folate: 16.1 ng/mL (ref >5.9)

## 2024-08-31 LAB — VITAMIN D 25 HYDROXY (VIT D DEFICIENCY, FRACTURES): Vit D, 25-Hydroxy: 53.21 ng/mL (ref 30–100)

## 2024-08-31 LAB — HIV ANTIBODY (ROUTINE TESTING W REFLEX): HIV Screen 4th Generation wRfx: NONREACTIVE

## 2024-08-31 MED ORDER — MONTELUKAST SODIUM 10 MG PO TABS
10.0000 mg | ORAL_TABLET | Freq: Every day | ORAL | Status: DC
  Administered 2024-08-31 – 2024-09-05 (×6): 10 mg via ORAL
  Filled 2024-08-31 (×6): qty 1

## 2024-08-31 MED ORDER — THIAMINE MONONITRATE 100 MG PO TABS
100.0000 mg | ORAL_TABLET | Freq: Every day | ORAL | Status: AC
Start: 2024-08-31 — End: 2024-09-05
  Administered 2024-08-31 – 2024-09-04 (×5): 100 mg via ORAL
  Filled 2024-08-31 (×5): qty 1

## 2024-08-31 MED ORDER — ADULT MULTIVITAMIN W/MINERALS CH
1.0000 | ORAL_TABLET | Freq: Every day | ORAL | Status: DC
  Administered 2024-08-31 – 2024-09-06 (×7): 1 via ORAL
  Filled 2024-08-31 (×7): qty 1

## 2024-08-31 MED ORDER — ENSURE PLUS HIGH PROTEIN PO LIQD
237.0000 mL | Freq: Two times a day (BID) | ORAL | Status: DC
  Administered 2024-08-31 – 2024-09-05 (×11): 237 mL via ORAL

## 2024-08-31 MED ORDER — MEGESTROL ACETATE 40 MG PO TABS
160.0000 mg | ORAL_TABLET | Freq: Every day | ORAL | Status: DC
  Administered 2024-08-31 – 2024-09-06 (×7): 160 mg via ORAL
  Filled 2024-08-31 (×7): qty 4

## 2024-08-31 MED ORDER — SODIUM CHLORIDE 0.9 % IV SOLN: INTRAVENOUS | Status: AC

## 2024-08-31 NOTE — Evaluation (Signed)
 Modified Barium Swallow Study  Patient Details  Name: Austin [name] MRN: [id number] Date of Birth: [birth year]  Today's Date: 08/31/2024  Modified Barium Swallow completed.  Full report located under Chart Review in the Imaging Section.  History of Present Illness Mr. Kleve is an 83 yo presenting 11/19 after GLF, also found to have multifocal CAP. CT Head showed scalp soft tissue injury and otherwise normal for age appearance of the brain. 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 Swallow eval was ordered due to pt having difficulty swallowing pills, reporting dysphagia for three weeks PTA. He was being treated for thrush by his pulmonologist, and GI referral was being considered if symptoms persisted after treatment. Pt also described dysarthria and weakness but MRI Brain 11/21 with no acute intracranial abnormality. 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 Airway protection is intact. The strategies most effective in reducing pharyngeal residue were second swallows and liquid washes. Pt prefers to stay on Dys 2 (finely chopped) diet and thin liquids with meds whole in puree. Discussed findings with MD including potential neurogenic component. May want to consider neurology consult considering acute onset of dysarthria, dysphagia, and cognitive changes.   Orally, pt has some extra lingual movements prior to initiation of posterior propulsion, but once initiated, movement is swift. Oral residue is small in volume and cleared spontaneously with a second swallow. There is more evidence of a pharyngeal dysphagia, including reduced anterior hyoid movement, pharyngeal squeeze, and PES opening, which result in moderate  amounts of residue. This is noted throughout the pharynx, but especially in the valleculae. Presence of suspected osteophytes may also be contributing. Several positional strategies were attempted, including a chin tuck and head turns to both sides, but there was no significant effect. Pt reduced, but did not clear residue, when he used either second swallows or a liquid wash. Pt was able to protect his airway throughout self-feeding with trace, transient penetration noted with a straw (PAS 2, considered to be normal) but no aspiration observed.   Factors that may increase risk of adverse event in presence of aspiration Noe & Lianne 2021): Respiratory or GI disease;Reduced cognitive function;Limited mobility;Frail or deconditioned;Reduced saliva  Swallow Evaluation Recommendations Recommendations: PO diet PO Diet Recommendation: Dysphagia 2 (Finely chopped);Thin liquids (Level 0) Liquid Administration via: Cup;Straw Medication Administration: Whole meds with puree Supervision: Patient able to self-feed;Intermittent supervision/cueing for swallowing strategies Swallowing strategies  : Slow rate;Small bites/sips;Follow solids with liquids;Multiple dry swallows after each bite/sip Postural changes: Position pt fully upright for meals;Stay upright 30-60 min after meals Oral care recommendations: Oral care BID (2x/day) Recommended consults: Other(comment) (consider neuro consult)      Leita SAILOR., M.A. CCC-SLP Acute Rehabilitation Services Office: [phone]  Secure chat preferred  08/31/2024,2:58 PM

## 2024-08-31 NOTE — Progress Notes (Signed)
 Patient's family member at bedside asked if bed alarm could be turned off while she feeds the patient. Informed family member to let staff know when she is done so the bed alarm can be turned back on. Informed assigned nurse, Jun, RN that patient's bed alarm is off per family member's request.

## 2024-08-31 NOTE — Progress Notes (Signed)
 PROGRESS NOTE    Austin [name]  FMW:[id number] DOB: [birth year] DOA: 08/29/2024 PCP: [name], MD    Brief Narrative:  The patient is an 83 year old male with PMHx of asthma/COPD overlap syndrome, chronic rhinitis, recent oral thrush treated with fluconazole , HTN, HLD, depression, prostate cancer who presented to the ED on 08/29/2024 after an episode of dizziness and subsequent fall with trauma to the right side of head leading to a right parietal laceration, without loss of consciousness.  On initial evaluation in the ED, he reportedly denied lightheadedness or dizziness prior to falling, though he later endorsed several weeks of dizziness and unsteadiness while ambulating, requiring the use of a walker, and was later noted to be dizzy and unable to ambulate in the ED.  MRI was deferred at that time.  In the ED, he was afebrile with a temp of 98.6 F, HR 97, RR 19, BP 135/85, SpO2 99% on RA.  CBC was unremarkable with no leukocytosis.  CMP showed albumin of 2.9, lower than longstanding baseline.  UA was negative for UTI findings, showed moderate Hgb in the setting of known prostate cancer.  COVID/flu/RSV negative.  CT head showed scalp soft tissue injury.  CT cervical spine showed no acute traumatic injury.  He was noted to have crackles at the left lower lobe with CXR findings of peripheral left lung nodular opacity suspicious for multifocal pneumonia, for which he was started on Rocephin  and azithromycin  and placed up for admission. Notably, patient has been experiencing dysphagia with solids and dysarthria for approximately 3 weeks now, which were thought to be associated with oral thrust in the setting of Breztri  use, for which he was treated with two consecutive 7-day courses of fluconazole  by his outpatient pulmonologist with discontinuation of Breztri  in favor of lower dose Advair . Further, he has unintentional weight loss of approximately 26 lbs over the last 3 months.   Assessment and  Plan:  # Acute stroke ruled out # Dysphagia # Dysarthria # Dizziness # Ataxia - Patient presents with multiple falls over last 3-4 weeks with new onset unsteadiness while ambulating necessitating the use of a walker, associated with some dizziness. Additionally, he has noted dysphagia and dysarthria around the same period of time, initially attributed to thrush but not resolving with treatment with 14 days of fluconazole  - MRI brain negative for acute intracranial abnormalities - SLP following for dysphagia and dysarthria/cognition - recommended dysphagia-2 with thin liquids, but concern for some underlying neurogenic component.  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  Was started on IV Rocephin  and azithromycin  and admitted for CAP - However, patient is afebrile with no leukocytosis and no cough.  Clinically, very low suspicion for pneumonia. - Procalcitonin <0.1, further supporting unlikely diagnosis of bacterial pneumonia - Of note, the  patient recently underwent a PET scan on 08/20/2024 which noted patchy ground glass densities in both lungs suggestive of edema or inflammation.  Outpatient pulmonology ordered a high-resolution CT scan for further characterization of the pulmonary findings on PET scan,  which was scheduled on the same day of his admission - Will continue IV Rocephin  and azithromycin  for now - CT chest (11/20) showed subpleural fibrosis with traction bronchiectasis and superimposed left lower lobe ground glass/consolidation concerning for an underlying interstitial lung disease, most consistent with UIP - Will defer further advanced imaging/workup to outpatient pulmonology   # Ground-level falls # Right parietal scalp laceration and hematoma - Presented after GLF in the bathroom, reportedly due to loss of balance, with trauma to head - CT head showed right posterior convexity scalp hematoma and laceration with confluent soft tissue thickening and mild soft tissue gas, underlying calvarium intact with no skull fracture. - CT cervical spine showed no acute traumatic injury, noted hyperostosis and ankylosis at C6-C7 with advanced upper cervical facet arthropathy - Underwent laceration repair in the ED  # History prostate cancer # Unintentional weight loss # Severe protein calorie malnutrition - Per review of records, the patient has lost ~ 26 lbs when comparing recorded weights on record from 8/6 and 11/12 - This is somewhat concerning in the setting of history prostate cancer - PET scan 11/12 showed intense intraprostatic activity favored to represent radioactive urine within the prostatic urethra, no evidence of metastatic adenopathy in the abdomen and pelvis, no distant metastatic prostate cancer noted. - RD following - recommended addition of appetite stimulant, Ensure Plus high-protein, multivitamin, thiamine  supplementation, assist with feeding - Started megace  160 mg daily - Started thiamine  100 mg for 5 days and multivitamin  # Asthma/COPD overlap syndrome - Follows closely with Dr. Meade in outpatient pulmonology - Continue home Advair  HFA with formulary alternative Breo Ellipta  - PRN DuoNebs  # Chronic rhinitis - Continue home Singulair  10 mg qhs # Hypertension -  Continue home amlodipine  5 mg daily and losartan  100 mg daily  #MDD - Continue home Wellbutrin  300 mg daily  #HLD - Continue home Lipitor 80 mg daily  # GERD - Continue PPI formulary alternative   DVT prophylaxis: enoxaparin  (LOVENOX ) injection 40 mg Start: 08/29/24 1400   Code Status:   Code Status: Full Code  Family Communication: Discussed with patient's wife, [name], over the phone  Disposition Plan: SNF for rehab pending completion of workup  PT - Follow Up Recommendations: Skilled nursing-short term rehab (<3 hours/day) OT - Follow Up Recommendations: Skilled nursing-short term rehab (<3 hours/day)  DME Needs: PT equipment: BSC/3in1    Level of care: Med-Surg  Consultants:  None  Procedures:  None  Antimicrobials: Rocephin  and azithromycin    Subjective: Patient evaluated at bedside today. Has no complaints. Tolerating PO intake with feeding assist. Denies fevers, chest pain, SOB, chills, cough.   Objective: Vitals:   08/30/24 1803 08/30/24 2041 08/31/24 0403 08/31/24 1011  BP: 112/73 119/64 119/78 122/74  Pulse: (!) 102 91 99 (!) 104  Resp: 18 17 17 17   Temp: 97.7 F (36.5 C) 97.7 F (36.5 C) 97.6 F (36.4 C) 97.7 F (36.5 C)  TempSrc: Oral   Oral  SpO2: 95% 96% 94% 98%  Weight:      Height:        Intake/Output Summary (Last 24 hours) at 08/31/2024 1201 Last data filed at 08/31/2024 0930 Gross per 24 hour  Intake 580 ml  Output 500 ml  Net 80  ml   Filed Weights   08/29/24 1524  Weight: 70 kg    Examination:  Gen: NAD, A&Ox3 HEENT: NCAT, EOMI Neck: Supple, no JVD, no LAD CV: RRR, no murmurs Resp: normal WOB, rales noted at left lung base, other lung field clear to ausculation. Abd: Soft, NTND, no guarding, BS normoactive Ext: No LE edema, pulses 2+ b/l Skin: Warm, dry, no rashes/lesions Neuro: dysarthric, 4/5 BLE strength, 5/5 BUE strength, gait testing deferred Psych: Calm, cooperative, appropriate affect   Data Reviewed: I  have personally reviewed following labs and imaging studies  CBC: Recent Labs  Lab 08/29/24 0740 08/30/24 1151 08/31/24 0520  WBC 7.9 10.8* 9.6  HGB 14.6 12.8* 13.3  HCT 42.6 38.2* 39.8  MCV 96.2 96.7 96.4  PLT 242 241 213   Basic Metabolic Panel: Recent Labs  Lab 08/29/24 0740 08/30/24 1151 08/31/24 0520  NA 141 137 138  K 3.7 4.0 3.9  CL 103 102 103  CO2 28 28 27   GLUCOSE 109* 117* 95  BUN 9 12 11   CREATININE 0.46* 0.59* 0.54*  CALCIUM  9.3 8.7* 8.6*   GFR: Estimated Creatinine Clearance: 69.3 mL/min (A) (by C-G formula based on SCr of 0.54 mg/dL (L)). Liver Function Tests: Recent Labs  Lab 08/29/24 0740  AST 28  ALT 29  ALKPHOS 89  BILITOT 0.9  PROT 6.6  ALBUMIN 2.9*   No results for input(s): LIPASE, AMYLASE in the last 168 hours. No results for input(s): AMMONIA in the last 168 hours. Coagulation Profile: No results for input(s): INR, PROTIME in the last 168 hours. Cardiac Enzymes: No results for input(s): CKTOTAL, CKMB, CKMBINDEX, TROPONINI in the last 168 hours. BNP (last 3 results) No results for input(s): PROBNP in the last 8760 hours. HbA1C: No results for input(s): HGBA1C in the last 72 hours. CBG: Recent Labs  Lab 08/29/24 0737  GLUCAP 109*   Lipid Profile: No results for input(s): CHOL, HDL, LDLCALC, TRIG, CHOLHDL, LDLDIRECT in the last 72 hours. Thyroid  Function Tests: No results for input(s): TSH, T4TOTAL, FREET4, T3FREE, THYROIDAB in the last 72 hours. Anemia Panel: No results for input(s): VITAMINB12, FOLATE, FERRITIN, TIBC, IRON, RETICCTPCT in the last 72 hours. Sepsis Labs: Recent Labs  Lab 08/30/24 1151  PROCALCITON <0.10    Recent Results (from the past 240 hours)  Resp panel by RT-PCR (RSV, Flu A&B, Covid) Anterior Nasal Swab     Status: None   Collection Time: 08/29/24  1:13 PM   Specimen: Anterior Nasal Swab  Result Value Ref Range Status   SARS Coronavirus 2 by  RT PCR NEGATIVE NEGATIVE Final   Influenza A by PCR NEGATIVE NEGATIVE Final   Influenza B by PCR NEGATIVE NEGATIVE Final    Comment: (NOTE) The Xpert Xpress SARS-CoV-2/FLU/RSV plus assay is intended as an aid in the diagnosis of influenza from Nasopharyngeal swab specimens and should not be used as a sole basis for treatment. Nasal washings and aspirates are unacceptable for Xpert Xpress SARS-CoV-2/FLU/RSV testing.  Fact Sheet for Patients: bloggercourse.com  Fact Sheet for Healthcare Providers: seriousbroker.it  This test is not yet approved or cleared by the [place]  FDA and has been authorized for detection and/or diagnosis of SARS-CoV-2 by FDA under an Emergency Use Authorization (EUA). This EUA will remain in effect (meaning this test can be used) for the duration of the COVID-19 declaration under Section 564(b)(1) of the Act, 21 U.S.C. section 360bbb-3(b)(1), unless the authorization is terminated or revoked.     Resp Syncytial Virus by  PCR NEGATIVE NEGATIVE Final    Comment: (NOTE) Fact Sheet for Patients: bloggercourse.com  Fact Sheet for Healthcare Providers: seriousbroker.it  This test is not yet approved or cleared by the [place]  FDA and has been authorized for detection and/or diagnosis of SARS-CoV-2 by FDA under an Emergency Use Authorization (EUA). This EUA will remain in effect (meaning this test can be used) for the duration of the COVID-19 declaration under Section 564(b)(1) of the Act, 21 U.S.C. section 360bbb-3(b)(1), unless the authorization is terminated or revoked.  Performed at [facility], [address], [place], [place] [address]      Radiology Studies: MR BRAIN WO CONTRAST Result Date: 08/30/2024 EXAM: MRI BRAIN WITHOUT CONTRAST 08/30/2024 11:05:00 PM TECHNIQUE: Multiplanar multisequence MRI of the head/brain was performed  without the administration of intravenous contrast. COMPARISON: CT head 08/29/2024. CLINICAL HISTORY: Dysarthria, gait unsteadiness, dizziness FINDINGS: BRAIN AND VENTRICLES: No acute infarct. No intracranial hemorrhage. No mass. No midline shift. No hydrocephalus. Normal flow voids. ORBITS: No acute abnormality. SINUSES AND MASTOIDS: No acute abnormality. BONES AND SOFT TISSUES: Normal marrow signal. No acute soft tissue abnormality. IMPRESSION: 1.No acute intracranial abnormality. Electronically signed by: Gilmore Molt MD 08/30/2024 11:55 PM EST RP Workstation: [id number]   CT CHEST WO CONTRAST Result Date: 08/30/2024 EXAM: CT CHEST WITHOUT CONTRAST 08/30/2024 06:19:08 PM TECHNIQUE: CT of the chest was performed without the administration of intravenous contrast. Multiplanar reformatted images are provided for review. Automated exposure control, iterative reconstruction, and/or weight based adjustment of the mA/kV was utilized to reduce the radiation dose to as low as reasonably achievable. COMPARISON: Chest radiograph of 08/29/2024 and PET CT examination of 08/16/2024. CLINICAL HISTORY: Abnormal xray - lung opacity/opacities. Prostate cancer. *tracking code: Bo* FINDINGS: MEDIASTINUM: Heart: Extensive multivessel coronary artery calcifications. Calcification of the aortic valve leaflets. Global cardiac size within normal limits. No pericardial effusion. The central airways are clear. No central obstructing lesion. Vasculature: Central pulmonary arteries are of normal caliber. Mild atherosclerotic calcification within the thoracic aorta. 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 Recommend annual imaging followup by CTA or MRA. This recommendation follows 2010 ACCF/AHA/AATS/ACR/ASA/SCA/SCAI/SIR/STS/SVM guidelines for the diagnosis and management of patients with thoracic aortic disease. Circulation. 2010; 121: Z733-z630. Aortic aneurysm NOS (ICD10-I71.9).  LYMPH NODES: No mediastinal, hilar or axillary lymphadenopathy. LUNGS AND PLEURA: Subpleural reticulation, bronchiolectasis, and architectural distortion is present in keeping with changes of subpleural fibrotic change. There is superimposed ground-glass infiltrate and consolidation within the left lower lobe with associated traction bronchiectasis. 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 No pneumothorax or pleural effusion. SOFT TISSUES/BONES: Osseous structures are age appropriate. No acute bone abnormality. No lytic or blastic bone lesion. No acute abnormality of the soft tissues. UPPER ABDOMEN: Limited images of the upper abdomen demonstrates no acute abnormality. IMPRESSION: 1. 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 Comparison with prior examinations will be helpful for further evaluation. If none are available, follow-up high-resolution CT imaging of the chest in 6 months would be helpful . 2. 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 Recommend annual imaging follow-up by CTA or MRA. This recommendation follows 2010 ACCF/AHA/AATS/ACR/ASA/SCA/SCAI/SIR/STS/SVM guidelines for the diagnosis and management of patients with thoracic aortic disease. Circulation. 2010;121:E266-E369. Aortic aneurysm NOS (ICD10-I71.9). 3. Extensive multivessel  coronary artery calcifications and aortic valve leaflet calcifications. Electronically signed by: Dorethia Molt MD 08/30/2024 08:45 PM EST RP Workstation: [id number]   DG Chest 2 View Result Date: 08/29/2024 CLINICAL DATA:  Shortness of breath, dizziness, recent fall EXAM: CHEST - 2 VIEW COMPARISON:  08/31/2021 FINDINGS: Chronic elevation  of the right hemidiaphragm. Mild peripheral left lung scattered nodular opacities diffusely worse in the lower lobe, suspicious for multifocal left lung pneumonia. Suspect small pleural effusions bilaterally. Stable chronic parenchymal scarring in the right lung. Overall stable heart size and vascularity. No acute edema pattern CHF. Aorta atherosclerotic. Degenerative changes of the spine. IMPRESSION: Peripheral left lung nodular opacities suspicious for multifocal pneumonia. Stable chronic changes. Electronically Signed   By: CHRISTELLA.  Shick M.D.   On: 08/29/2024 12:59    Scheduled Meds:  amLODipine   5 mg Oral Daily   atorvastatin   80 mg Oral Daily   buPROPion   300 mg Oral Daily   enoxaparin  (LOVENOX ) injection  40 mg Subcutaneous Q24H   fluticasone  furoate-vilanterol  1 puff Inhalation Daily   losartan   100 mg Oral Daily   montelukast   10 mg Oral QHS   pantoprazole   80 mg Oral Daily   Continuous Infusions:  cefTRIAXone  (ROCEPHIN )  IV 1 g (08/31/24 0818)     LOS:  LOS: 2 days   Time Spent: 45 minutes  Unresulted Labs (From admission, onward)     Start     Ordered   09/05/24 0500  Creatinine, serum  (enoxaparin  (LOVENOX )    CrCl >/= 30 ml/min)  Weekly,   R     Comments: while on enoxaparin  therapy    08/29/24 1339   08/30/24 1106  CBC  Daily,   R      08/30/24 1105   08/30/24 1106  Basic metabolic panel with GFR  Daily,   R      08/30/24 1105             Sheikh Leverich Al-Sultani, MD [facility]  If 7PM-7AM, please contact night-coverage  08/31/2024, 12:01 PM

## 2024-08-31 NOTE — Evaluation (Signed)
 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   Seasonal allergic rhinitis 08/31/2021   Vitamin B12 deficiency 02/10/2021   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   Chronic low back pain 02/11/2020   Insomnia 02/11/2020   Hyperglycemia 11/08/2019   Vitamin D  deficiency 11/08/2019   Essential hypertension 11/08/2019   Gastroesophageal reflux disease 11/08/2019   Depression, major, single episode, 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   Cervical disc disease 11/08/2019   Asthma, persistent controlled 11/08/2019   Prostate cancer (HCC) 11/08/2019   Status post cataract extraction 11/08/2019   Dyslipidemia 11/08/2019   Past Medical History:  Past Medical History:  Diagnosis Date   Asthma    Depression    Hypertension    Prostate cancer (HCC)    Urinary incontinence    Past Surgical History:  Past Surgical History:  Procedure Laterality Date   GALLBLADDER SURGERY  1997   REPLACEMENT TOTAL KNEE BILATERAL  2001   2007   surgery replacement Bilateral 2005   TOTAL SHOULDER REPLACEMENT Bilateral 2000   HPI:  [name] is an 83 yo presenting 11/19 after GLF, also found to have multifocal CAP. CT Head showed scalp soft tissue injury and otherwise normal for age appearance of the brain. 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 Swallow eval was ordered due to pt having difficulty swallowing pills, reporting dysphagia for three  weeks PTA. He was being treated for thrush by his pulmonologist, and GI referral was being considered if symptoms persisted after treatment. Pt also described dysarthria and weakness but MRI Brain 11/21 with no acute intracranial abnormality. 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 PMH also includes: asthma, depression, HTN, prostate ca   Assessment / Plan / Recommendation Clinical Impression  Pt presents with changes to speech and cognition that he believes have started over the last three weeks. Prior to this, he was fairly independent, with his wife helping him keep up with appointments mostly. Unclear etiology of changes given negative findings on MRI Brain. Recommend ongoing SLP f/u acutely and at next level of care though.   Pt's speech is dysarthric with imprecise articulation. Changes to speech were noticed a few weeks ago and felt to be related to thrush, but pt and wife have not noticed improvements while he was undergoing tx for this. She thinks his tongue may look a little better today. Oral motor exam without other significant findings. Pt's language seems relatively strong but he does also have acute cognitive changes as assessed by subtests from the [facility]. This includes difficulty in the areas of: orientation (6/12), sustained attention (4/8), memory (5/12), calculations (2/4), and reasoning (3/8). With the memory subtest, pt had a lot of difficulty with immediate recall even with a lot of repeated repetitions.      SLP Assessment  SLP Recommendation/Assessment: Patient needs continued Speech Language Pathology Services SLP Visit Diagnosis: Dysarthria and anarthria (R47.1);Cognitive communication deficit (R41.841)     Assistance Recommended at Discharge  Frequent or constant Supervision/Assistance  Functional Status Assessment Patient has had a recent decline in their functional status and demonstrates the ability to  make significant improvements in function in a reasonable and predictable amount of time.  Frequency and Duration min 2x/week  2 weeks      SLP Evaluation Cognition  Overall Cognitive Status: Impaired/Different from baseline Arousal/Alertness: Awake/alert Orientation Level: Oriented to person;Oriented to situation;Disoriented to place;Disoriented to time Attention: Sustained Sustained Attention: Impaired Sustained Attention Impairment: Verbal basic Memory: Impaired Memory Impairment: Storage deficit;Retrieval deficit;Decreased recall of new information Problem Solving: Impaired Problem Solving Impairment: Verbal complex Executive Function: Reasoning Reasoning: Impaired Reasoning Impairment: Verbal basic       Comprehension  Auditory Comprehension Overall Auditory Comprehension: Impaired Commands: Impaired One Step Basic Commands: 75-100% accurate Multistep Basic Commands: 50-74% accurate Conversation: Simple Interfering Components: Hearing    Expression Expression Primary Mode of Expression: Verbal Verbal Expression Overall Verbal Expression: Appears within functional limits for tasks assessed   Oral / Motor  Motor Speech Overall Motor Speech: Impaired Respiration: Within functional limits Phonation: Normal Resonance: Within functional limits Articulation: Impaired Level of Impairment: Conversation Intelligibility: Intelligibility reduced Conversation: 75-100% accurate            Leita SAILOR., M.A. CCC-SLP Acute Rehabilitation Services Office: [phone]  Secure chat preferred  08/31/2024, 11:31 AM

## 2024-08-31 NOTE — Progress Notes (Signed)
 SLP Cancellation Note  Patient Details Name: Austin [name] MRN: [id number] DOB: 1940/11/16   Cancelled treatment:       Reason Eval/Treat Not Completed: Patient receiving nursing care and currently unavailable. Will f/u as able.    Leita SAILOR., M.A. CCC-SLP Acute Rehabilitation Services Office: [phone]  Secure chat preferred  08/31/2024, 9:12 AM

## 2024-08-31 NOTE — Progress Notes (Signed)
 Initial Nutrition Assessment  DOCUMENTATION CODES:   Severe malnutrition in context of chronic illness  INTERVENTION:  Encourage po intake - Dysphagia 2, thin liquids per SLP Room service with assist Ensure Plus High Protein po BID, each supplement provides 350 kcal and 20 grams of protein Magic cup TID with meals, each supplement provides 290 kcal and 9 grams of protein - prefers chocolate  MVI with minerals daily 100 mg Thiamine  x 5 days Recommend appetite stimulant  Provided High calorie, high protein handout nutrition therapy and handout  Provided Ensure coupons   NUTRITION DIAGNOSIS:   Severe Malnutrition related to chronic illness as evidenced by severe muscle depletion, severe fat depletion, energy intake < or equal to 75% for > or equal to 1 month, percent weight loss (Has lost 34 lbs, 18% in 8 months).  GOAL:   Patient will meet greater than or equal to 90% of their needs   MONITOR:   PO intake, Supplement acceptance, Diet advancement, Labs, Weight trends  REASON FOR ASSESSMENT:   Consult Assessment of nutrition requirement/status  ASSESSMENT:  83 yo Male presenting after GLF, with trauma to right side of head, also found to have multifocal CAP. CT Head showed scalp soft tissue injury, CT of spine showed no acute traumatic injury. PMH of COPD, HTN, HLD, thrush, depression, prostate cancer, dysphagia.  Pt up in chair with wife at bedside, pt was hard to understand at times d/t dysarthria however pt orientated x 4.   Pt lives with his wife who makes pt's meals for him, pt was able to do ADLs independently PTA with a RW. Pt has had decreased appetite for a while now, wife reports for about a year or so. Pt used to be a big eater however within the last year has not been interested in eating. This has been exacerbated within the last couple of weeks d/t pt's dysphagia with solids and dysarthria, which was thought to be due to thrush which he is continuing to be treated  for. Months ago pt was eating 3 small meals per day with some snacking in between however in the last couple of weeks has mostly been following a liquid diet for ease of intake. Mainly soups, cereal, milk, etc. Has not been taking any ONS PTA. Has been progressively getting weaker and has had multiple falls in the past couple of months.   Pt with significant weight loss since March of this year. Has lost 34 lbs, 18% in 8 months which is clinically significant. Concerning given pt's history of prostate cancer however PET scan on 11/12 showed no evidence of recurrent prostate cancer.  Pt also with severe muscle and fat wasting. Pt meets criteria for severe malnutrition. Discussed ways to increase intake with pt and wife and encouraged use of ONS during in patient and outpatient use. RD provided Ensure for pt to try in which pt really enjoyed. Recommend appetite stimulant, notified MD.  RD provided High-Calorie High-Protein Nutrition Therapy handout from the Academy of [employer]etics. Reviewed patient's dietary recall. Provided examples on ways to increase caloric density of foods and beverages frequently consumed by the patient. Also provided ideas to promote variety and to incorporate additional nutrient dense foods into patient's diet. Discussed eating small frequent meals and snacks to assist in increasing overall po intake. Teach back method used.  MBS planned for today.  Dietary recall: Breakfast: Banana and yogurt with berries Lunch: Tuna sandwich with some chips Dinner: Bites-25% of protein, vegetables, starch Snacks: Sweets  Drinks: Water and 1-2 regular soda per day    Dispo: Possibly SNF vs home  Admit weight: 70 kg Current weight:70 kg Wt Readings from Last 10 Encounters:  08/29/24 70 kg  08/22/24 69.7 kg  08/14/24 72.6 kg  07/17/24 76.7 kg  07/09/24 76.6 kg  06/13/24 78.9 kg  05/16/24 78 kg  01/10/24 81.7 kg  01/05/24 85.7 kg  12/22/23 81.2 kg   - 34 lbs, 18% in 8  months  Average Meal Intake: 11/20: 100% intake x 1 recorded meals  Nutritionally Relevant Medications: Scheduled Meds:  amLODipine   5 mg Oral Daily   atorvastatin   80 mg Oral Daily   buPROPion   300 mg Oral Daily   enoxaparin  (LOVENOX ) injection  40 mg Subcutaneous Q24H   feeding supplement  237 mL Oral BID BM   fluticasone  furoate-vilanterol  1 puff Inhalation Daily   losartan   100 mg Oral Daily   montelukast   10 mg Oral QHS   multivitamin with minerals  1 tablet Oral Daily   pantoprazole   80 mg Oral Daily   thiamine   100 mg Oral Daily   Continuous Infusions:  cefTRIAXone  (ROCEPHIN )  IV 1 g (08/31/24 0818)   Labs Reviewed: Creatinine 0.54 CBG ranges from 95-117 mg/dL over the last 24 hours HgbA1c 6.0  NUTRITION - FOCUSED PHYSICAL EXAM:  Flowsheet Row Most Recent Value  Orbital Region Moderate depletion  Upper Arm Region Severe depletion  Thoracic and Lumbar Region Severe depletion  Buccal Region Moderate depletion  Temple Region Severe depletion  Clavicle Bone Region Severe depletion  Clavicle and Acromion Bone Region Severe depletion  Scapular Bone Region Severe depletion  Dorsal Hand Moderate depletion  Patellar Region Severe depletion  Anterior Thigh Region Severe depletion  Posterior Calf Region Severe depletion  Edema (RD Assessment) None  Hair Reviewed  Eyes Reviewed  Mouth Reviewed  [Missing some teeth]  Skin Reviewed  Nails Reviewed    Diet Order:   Diet Order             DIET DYS 2 Room service appropriate? Yes with Assist; Fluid consistency: Thin  Diet effective now                   EDUCATION NEEDS:   Education needs have been addressed  Skin:  Skin Assessment: Reviewed RN Assessment  Last BM:  PTA  Height:   Ht Readings from Last 1 Encounters:  08/29/24 5' 9 (1.753 m)    Weight:   Wt Readings from Last 1 Encounters:  08/29/24 70 kg    Ideal Body Weight:  72.7 kg  BMI:  Body mass index is 22.79 kg/m.  Estimated  Nutritional Needs:   Kcal:  1700-2000  Protein:  90-110 gm  Fluid:  >1.7L/day   Olivia Kenning, RD Registered Dietitian  See Amion for more information

## 2024-08-31 NOTE — TOC Initial Note (Addendum)
 Transition of Care [facility]) - Initial/Assessment Note    Patient Details  Name: Austin [name] MRN: [id number] Date of Birth: [birth year]  Transition of [name]) CM/SW Contact:    [name], Student-Social Work Phone Number: 08/31/2024, 12:05 PM  Clinical Narrative:                  Per chart review Pt admitted from home with spouse due to fall. PCP listed. MSW Intern contacted pt spouse, Austin [name] to complete SNF workup. MSW Intern did not receive response and left voicemail and provided call back information. MSW Intern will continue to follow. Pt insurance is MEDICARE / MEDICARE PART A AND B   4:24 PM-CSW spoke with pt son, Austin [name] over the phone. Austin [name] is agreeable to SNF following hospital DC. Austin [name] stated he is pt POA and is the best point of contact for DC planning and can be reached at [phone]. CSW completed Fl2 and sent out SNF referrals. CSW will follow up to provide bed offers.   Expected Discharge Plan: Skilled Nursing Facility Barriers to Discharge: SNF Pending bed offer, Continued Medical Work up   Patient Goals and CMS Choice Patient states their goals for this hospitalization and ongoing recovery are:: SNF          Expected Discharge Plan and Services In-house Referral: Clinical Social Work     Living arrangements for the past 2 months: Single Family Home                                      Prior Living Arrangements/Services Living arrangements for the past 2 months: Single Family Home Lives with:: Spouse Patient language and need for interpreter reviewed:: Yes Do you feel safe going back to the place where you live?: Yes      Need for Family Participation in Patient Care: Yes (Comment) Care giver support system in place?: Yes (comment)   Criminal Activity/Legal Involvement Pertinent to Current Situation/Hospitalization: No - Comment as needed  Activities of Daily Living   ADL Screening (condition at time of admission) Independently  performs ADLs?: No Does the patient have a NEW difficulty with bathing/dressing/toileting/self-feeding that is expected to last >3 days?: Yes (Initiates electronic notice to provider for possible OT consult) Does the patient have a NEW difficulty with getting in/out of bed, walking, or climbing stairs that is expected to last >3 days?: Yes (Initiates electronic notice to provider for possible PT consult) Does the patient have a NEW difficulty with communication that is expected to last >3 days?: No Is the patient deaf or have difficulty hearing?: Yes Does the patient have difficulty seeing, even when wearing glasses/contacts?: No Does the patient have difficulty concentrating, remembering, or making decisions?: No  Permission Sought/Granted Permission sought to share information with : Facility Medical Sales Representative, Family Supports    Share Information with NAME: [name]- Spouse  Permission granted to share info w AGENCY: SNF  Permission granted to share info w Relationship: Spouse  Permission granted to share info w Contact Information: [name], [name] (Spouse)  [phone]  Emotional Assessment Appearance:: Appears stated age Attitude/Demeanor/Rapport: Unable to Assess Affect (typically observed): Unable to Assess Orientation: : Oriented to Self, Oriented to Situation Alcohol / Substance Use: Not Applicable Psych Involvement: No (comment)  Admission diagnosis:  CAP (community acquired pneumonia) [J18.9] Scalp laceration, initial encounter [S01.01XA] Community acquired pneumonia of left lower lobe of lung [J18.9] Patient Active Problem List  Diagnosis Date Noted   CAP (community acquired pneumonia) 08/29/2024   Leg edema 07/09/2024   Senile purpura 07/09/2024   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   Seasonal allergic rhinitis 08/31/2021   Vitamin B12 deficiency 02/10/2021   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   Bilateral carpal  tunnel syndrome 01/20/2021   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   Chronic low back pain 02/11/2020   Insomnia 02/11/2020   Hyperglycemia 11/08/2019   Vitamin D  deficiency 11/08/2019   Essential hypertension 11/08/2019   Gastroesophageal reflux disease 11/08/2019   Depression, major, single episode, 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   Cervical disc disease 11/08/2019   Asthma, persistent controlled 11/08/2019   Prostate cancer (HCC) 11/08/2019   Status post cataract extraction 11/08/2019   Dyslipidemia 11/08/2019   PCP:  [name], MD Pharmacy:   [phone] - [place], [place] - [address] [place] [address] Phone: [phone] Fax: [phone]  [id number] [place], [place] - [address] [place] [address] Phone: [phone] Fax: [phone]     Social Drivers of Health (SDOH) Social History: SDOH Screenings   Food Insecurity: No Food Insecurity (08/29/2024)  Housing: Low Risk  (08/29/2024)  Transportation Needs: No Transportation Needs (08/29/2024)  Utilities: Not At Risk (08/29/2024)  Depression (PHQ2-9): High Risk (07/09/2024)  Financial Resource Strain: Low Risk  (09/27/2023)  Physical Activity: Insufficiently Active (09/27/2023)  Social Connections: Moderately Isolated (08/29/2024)  Stress: No Stress Concern Present (09/27/2023)  Tobacco Use: Medium Risk (08/31/2024)  Health Literacy: Adequate Health Literacy (09/27/2023)   SDOH Interventions:     Readmission Risk Interventions     No data to display

## 2024-08-31 NOTE — NC FL2 (Signed)
 [place]  MEDICAID FL2 LEVEL OF CARE FORM     IDENTIFICATION  Patient Name: Austin [name] Birthdate: May 15, 1941 Sex: male Admission Date (Current Location): 08/29/2024  [facility] and [place] Number:  Producer, Television/film/video and Address:  [facility] [place], 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   Seasonal allergic rhinitis 08/31/2021   Vitamin B12 deficiency 02/10/2021   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   Chronic low back pain 02/11/2020   Insomnia 02/11/2020   Hyperglycemia 11/08/2019   Vitamin D  deficiency 11/08/2019   Essential hypertension 11/08/2019   Gastroesophageal reflux disease 11/08/2019   Depression, major, single episode, 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   Cervical disc disease 11/08/2019   Asthma, persistent controlled 11/08/2019   Prostate cancer (HCC) 11/08/2019   Status post cataract extraction 11/08/2019   Dyslipidemia 11/08/2019    Orientation RESPIRATION BLADDER Height & Weight     Self, Situation  Normal Continent Weight: 154 lb 5.2 oz (70 kg) Height:  5' 9 (175.3 cm)  BEHAVIORAL SYMPTOMS/MOOD NEUROLOGICAL BOWEL NUTRITION STATUS      Continent Diet (See DC Summary)  AMBULATORY STATUS COMMUNICATION OF  NEEDS Skin   Extensive Assist Verbally Other (Comment) (wound on head)                       Personal Care Assistance Level of Assistance  Bathing, Feeding, Dressing Bathing Assistance: Maximum assistance Feeding assistance: Limited assistance Dressing Assistance: Maximum assistance     Functional Limitations Info  Sight, Hearing, Speech Sight Info: Impaired Hearing Info: Adequate Speech Info: Adequate    SPECIAL CARE FACTORS FREQUENCY  PT (By licensed PT), OT (By licensed OT)     PT Frequency: 5x/week OT Frequency: 5x/week            Contractures Contractures Info: Not present    Additional Factors Info  Code Status, Allergies Code Status Info: Full Allergies Info: No known allergies           Current Medications (08/31/2024):  This is the current hospital active medication list Current Facility-Administered Medications  Medication Dose Route Frequency Provider Last Rate Last Admin   amLODipine  (NORVASC ) tablet 5 mg  5 mg Oral Daily Georgina Basket, MD   5 mg at 08/31/24 0815   atorvastatin  (LIPITOR) tablet 80 mg  80 mg Oral Daily Georgina Basket, MD   80 mg at 08/31/24 0815   buPROPion  (WELLBUTRIN  XL) 24 hr tablet 300 mg  300 mg Oral Daily Georgina Basket, MD   300 mg at 08/31/24 0815   cefTRIAXone  (ROCEPHIN ) 1 g in sodium chloride  0.9 % 100 mL IVPB  1 g Intravenous Q24H Georgina Basket, MD 200 mL/hr at 08/31/24 0818 1 g at 08/31/24 0818   enoxaparin  (LOVENOX ) injection  40 mg  40 mg Subcutaneous Q24H Georgina Basket, MD   40 mg at 08/31/24 1522   feeding supplement (ENSURE PLUS HIGH PROTEIN) liquid 237 mL  237 mL Oral BID BM Al-Sultani, Anmar, MD   237 mL at 08/31/24 1522   fluticasone  furoate-vilanterol (BREO ELLIPTA ) 100-25 MCG/ACT 1 puff  1 puff Inhalation Daily Georgina Basket, MD   1 puff at 08/31/24 0816   ipratropium-albuterol  (DUONEB) 0.5-2.5 (3) MG/3ML nebulizer solution 3 mL  3 mL Nebulization Q6H PRN Georgina Basket, MD       losartan  (COZAAR ) tablet 100 mg  100  mg Oral Daily Georgina Basket, MD   100 mg at 08/31/24 0815   megestrol  (MEGACE ) tablet 160 mg  160 mg Oral Daily Al-Sultani, Anmar, MD   160 mg at 08/31/24 1519   montelukast  (SINGULAIR ) tablet 10 mg  10 mg Oral QHS Al-Sultani, Anmar, MD       multivitamin with minerals tablet 1 tablet  1 tablet Oral Daily Al-Sultani, Anmar, MD   1 tablet at 08/31/24 1522   pantoprazole  (PROTONIX ) EC tablet 80 mg  80 mg Oral Daily Al-Sultani, Anmar, MD   80 mg at 08/31/24 0815   thiamine  (VITAMIN B1) tablet 100 mg  100 mg Oral Daily Al-Sultani, Anmar, MD   100 mg at 08/31/24 1522     Discharge Medications: Please see discharge summary for a list of discharge medications.  Relevant Imaging Results:  Relevant Lab Results:   Additional Information SSN: [id number]  Jeoffrey LITTIE Moose, LCSWA

## 2024-08-31 NOTE — Progress Notes (Addendum)
 Speech Language Pathology Treatment: Dysphagia  Patient Details Name: Austin [name] MRN: [id number] DOB: 27-Oct-1940 Today's Date: 08/31/2024 Time: 1010-1027 SLP Time Calculation (min) (ACUTE ONLY): 17 min  Assessment / Plan / Recommendation Clinical Impression  Pt and wife both believe that pt is doing well on Dys 2 diet and thin liquids, also reporting subjective improvement with taking pills whole in puree. They specifically state that things are not feeling stuck. Pt self-fed advanced solids and thin liquids, using liquids as a wash to soften foods before swallowing with Mod I. Today, he is observed to have increased throat clearing and occasional delayed coughing though. When asked more specifically about this, his wife acknowledges that he has been coughing some.   PLAN: Given changes to swallowing over the last few weeks and increased signs of dysphagia observed by SLP today, will proceed with MBS. This is tentatively scheduled for later today. Will leave on current diet for now.   HPI HPI: Austin [name] is an 83 yo presenting 11/19 after GLF, also found to have multifocal CAP. CT Head showed scalp soft tissue injury and otherwise normal for age appearance of the brain. 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 Swallow eval was ordered due to pt having difficulty swallowing pills, reporting dysphagia for three weeks PTA. He was being treated for thrush by his pulmonologist, and GI referral was being considered if symptoms persisted after treatment. Pt also described dysarthria and weakness but MRI Brain 11/21 with no acute intracranial abnormality. 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 PMH also includes: asthma, depression, HTN, prostate ca      SLP Plan  MBS          Recommendations  Diet recommendations: Dysphagia 2 (fine chop);Thin liquid Liquids  provided via: Cup;Straw Medication Administration: Whole meds with puree Supervision: Patient able to self feed;Intermittent supervision to cue for compensatory strategies Compensations: Slow rate;Small sips/bites;Follow solids with liquid Postural Changes and/or Swallow Maneuvers: Seated upright 90 degrees;Upright 30-60 min after meal                  Oral care BID     Dysphagia, unspecified (R13.10)     MBS     Leita SAILOR., M.A. CCC-SLP Acute Rehabilitation Services Office: [phone]  Secure chat preferred   08/31/2024, 11:17 AM

## 2024-09-01 ENCOUNTER — Inpatient Hospital Stay (HOSPITAL_COMMUNITY)

## 2024-09-01 DIAGNOSIS — G7000 Myasthenia gravis without (acute) exacerbation: Secondary | ICD-10-CM | POA: Diagnosis not present

## 2024-09-01 LAB — CBC
HCT: 37.8 % — ABNORMAL LOW (ref 39.0–52.0)
Hemoglobin: 12.6 g/dL — ABNORMAL LOW (ref 13.0–17.0)
MCH: 32.4 pg (ref 26.0–34.0)
MCHC: 33.3 g/dL (ref 30.0–36.0)
MCV: 97.2 fL (ref 80.0–100.0)
Platelets: 217 K/uL (ref 150–400)
RBC: 3.89 MIL/uL — ABNORMAL LOW (ref 4.22–5.81)
RDW: 14.2 % (ref 11.5–15.5)
WBC: 10 K/uL (ref 4.0–10.5)
nRBC: 0 % (ref 0.0–0.2)

## 2024-09-01 LAB — BASIC METABOLIC PANEL WITH GFR
Anion gap: 7 (ref 5–15)
BUN: 9 mg/dL (ref 8–23)
CO2: 25 mmol/L (ref 22–32)
Calcium: 8.5 mg/dL — ABNORMAL LOW (ref 8.9–10.3)
Chloride: 107 mmol/L (ref 98–111)
Creatinine, Ser: 0.39 mg/dL — ABNORMAL LOW (ref 0.61–1.24)
GFR, Estimated: >60 mL/min (ref >60)
Glucose, Bld: 76 mg/dL (ref 70–99)
Potassium: 3.7 mmol/L (ref 3.5–5.1)
Sodium: 139 mmol/L (ref 135–145)

## 2024-09-01 LAB — SYPHILIS: RPR W/REFLEX TO RPR TITER AND TREPONEMAL ANTIBODIES, TRADITIONAL SCREENING AND DIAGNOSIS ALGORITHM: RPR Ser Ql: NONREACTIVE

## 2024-09-01 MED ORDER — PYRIDOSTIGMINE BROMIDE 10 MG/2ML IV SOLN
2.0000 mg | Freq: Once | INTRAVENOUS | Status: AC
  Administered 2024-09-01: 2 mg via INTRAVENOUS
  Filled 2024-09-01: qty 0.4

## 2024-09-01 MED ORDER — IMMUNE GLOBULIN (HUMAN) 10 GM/100ML IV SOLN
400.0000 mg/kg | INTRAVENOUS | Status: AC
  Administered 2024-09-01 – 2024-09-05 (×5): 30 g via INTRAVENOUS
  Filled 2024-09-01 (×6): qty 300

## 2024-09-01 MED ORDER — FUROSEMIDE 10 MG/ML IJ SOLN
20.0000 mg | Freq: Once | INTRAMUSCULAR | Status: AC
  Administered 2024-09-01: 20 mg via INTRAVENOUS
  Filled 2024-09-01: qty 2

## 2024-09-01 MED ORDER — PYRIDOSTIGMINE BROMIDE 60 MG PO TABS
30.0000 mg | ORAL_TABLET | Freq: Three times a day (TID) | ORAL | Status: DC
  Administered 2024-09-01 – 2024-09-06 (×13): 30 mg via ORAL
  Filled 2024-09-01 (×19): qty 0.5

## 2024-09-01 MED ORDER — SODIUM CHLORIDE 0.9 % IV SOLN: INTRAVENOUS | Status: AC

## 2024-09-01 MED ORDER — DEXTROSE 5 % IV SOLN: INTRAVENOUS | Status: AC | PRN

## 2024-09-01 MED ORDER — IOHEXOL 350 MG/ML SOLN
75.0000 mL | Freq: Once | INTRAVENOUS | Status: AC | PRN
  Administered 2024-09-01: 75 mL via INTRAVENOUS

## 2024-09-01 MED ORDER — PYRIDOSTIGMINE BROMIDE 10 MG/2ML IV SOLN
1.0000 mg | Freq: Three times a day (TID) | INTRAVENOUS | Status: DC
  Administered 2024-09-01 – 2024-09-02 (×2): 1 mg via INTRAVENOUS
  Filled 2024-09-01 (×7): qty 0.2

## 2024-09-01 NOTE — Consult Note (Addendum)
 NEUROLOGY CONSULT NOTE   Date of service: September 01, 2024 Patient Name: Austin [name] MRN:  [id number] DOB:  08-02-1941 Chief Complaint: dysarthria, dysphagia Requesting Provider: Al-Sultani, Anmar, MD  History of Present Illness  Austin [name] is a 83 y.o. male with hx of asthma, depression, HTN, prostrate cancer, urinary incontinence, who initially presented with a ground level fall in the setting of pneumonia.  He was admitted to the hospital. He was noted to have trouble swallowing pills, and upon further discusion with family, had been having trouble swallowing and slurred speech x 2-3 months. He had lost significant weight due to not being able to eat and swallow.  He had further workup with MRI Brain which was nonrevealing and neurology was consulted for further evaluation and workup.  Reports prefers soft foods over chewy food like steaks. Would choke on steaks. Easier to eat breakfast than dinner. Feels better after a nap. Difficult to climb stairs or get up from a recliner. Eyes become droopy as day goes by.  Mouth has been dry for few weeks and thought to be secondary to oral thrush from using too much steroids inhalers. Was unable to swallow thrush pills.   ROS  Comprehensive ROS performed and pertinent positives documented in HPI   Past History   Past Medical History:  Diagnosis Date   Asthma    Depression    Hypertension    Prostate cancer [facility])    Urinary incontinence     Past Surgical History:  Procedure Laterality Date   GALLBLADDER SURGERY  1997   REPLACEMENT TOTAL KNEE BILATERAL  2001   2007   surgery replacement Bilateral 2005   TOTAL SHOULDER REPLACEMENT Bilateral 2000    Family History: Family History  Problem Relation Age of Onset   Diabetes Brother    Heart disease Paternal Grandfather    Asthma Neg Hx     Social History  reports that he quit smoking about 4 years ago. His smoking use included cigars. He has never used smokeless tobacco.  He reports current alcohol use. He reports that he does not use drugs.  No Known Allergies  Medications   Current Facility-Administered Medications:    atorvastatin  (LIPITOR) tablet 80 mg, 80 mg, Oral, Daily, Georgina Basket, MD, 80 mg at 08/31/24 9184   buPROPion  (WELLBUTRIN  XL) 24 hr tablet 300 mg, 300 mg, Oral, Daily, Georgina Basket, MD, 300 mg at 08/31/24 0815   cefTRIAXone  (ROCEPHIN ) 1 g in sodium chloride  0.9 % 100 mL IVPB, 1 g, Intravenous, Q24H, Georgina Basket, MD, Last Rate: 200 mL/hr at 08/31/24 0818, 1 g at 08/31/24 0818   enoxaparin  (LOVENOX ) injection 40 mg, 40 mg, Subcutaneous, Q24H, Georgina Basket, MD, 40 mg at 08/31/24 1522   feeding supplement (ENSURE PLUS HIGH PROTEIN) liquid 237 mL, 237 mL, Oral, BID BM, Al-Sultani, Anmar, MD, 237 mL at 08/31/24 1522   fluticasone  furoate-vilanterol (BREO ELLIPTA ) 100-25 MCG/ACT 1 puff, 1 puff, Inhalation, Daily, Georgina Basket, MD, 1 puff at 08/31/24 0816   ipratropium-albuterol  (DUONEB) 0.5-2.5 (3) MG/3ML nebulizer solution 3 mL, 3 mL, Nebulization, Q6H PRN, Georgina Basket, MD   megestrol  (MEGACE ) tablet 160 mg, 160 mg, Oral, Daily, Al-Sultani, Anmar, MD, 160 mg at 08/31/24 1519   montelukast  (SINGULAIR ) tablet 10 mg, 10 mg, Oral, QHS, Al-Sultani, Anmar, MD, 10 mg at 08/31/24 2033   multivitamin with minerals tablet 1 tablet, 1 tablet, Oral, Daily, Al-Sultani, Anmar, MD, 1 tablet at 08/31/24 1522   pantoprazole  (PROTONIX ) EC tablet 80 mg, 80 mg, Oral,  Daily, Al-Sultani, Anmar, MD, 80 mg at 08/31/24 0815   thiamine  (VITAMIN B1) tablet 100 mg, 100 mg, Oral, Daily, Al-Sultani, Anmar, MD, 100 mg at 08/31/24 1522  Vitals   Vitals:   08/31/24 1011 08/31/24 1815 08/31/24 2012 09/01/24 0533  BP: 122/74 121/69 121/66 133/75  Pulse: (!) 104 99 98 96  Resp: 17 20 17 18   Temp: 97.7 F (36.5 C) (!) 97.3 F (36.3 C) 98 F (36.7 C) (!) 97.5 F (36.4 C)  TempSrc: Oral Oral Oral Oral  SpO2: 98% 95% 94% 95%  Weight:      Height:        Body mass  index is 22.79 kg/m.   Physical Exam   General: Laying comfortably in bed; in no acute distress.  HENT: Normal oropharynx and mucosa. Normal external appearance of ears and nose.  Neck: Supple, no pain or tenderness  CV: No JVD. No peripheral edema.  Pulmonary: Symmetric Chest rise. Normal respiratory effort.  Abdomen: Soft to touch, non-tender.  Ext: No cyanosis, edema, or deformity  Skin: No rash. Normal palpation of skin.   Musculoskeletal: Normal digits and nails by inspection. No clubbing.   Neurologic Examination  Mental status/Cognition: Alert, oriented to self, place, month and year, good attention. Speech/language: dysarthric speech, trails off and becomes more hypophonic as we continue talking. fluent, comprehension intact, object naming intact, repetition intact. Cranial nerves:   CN II Pupils equal and reactive to light, no VF deficits    CN III,IV,VI EOM intact, no gaze preference or deviation, no nystagmus. Can sustain upgaze but reports subjective diplopia.   CN V normal sensation in V1, V2, and V3 segments bilaterally    CN VII no asymmetry, no nasolabial fold flattening    CN VIII normal hearing to speech    CN IX & X normal palatal elevation, no uvular deviation    CN XI 5/5 head turn and 5/5 shoulder shrug bilaterally    CN XII midline tongue protrusion    Motor:  Muscle bulk: poor, tone normal. Neck flexion: 4/5 Neck extension: 5/5 Mvmt Root Nerve  Muscle Right Left Comments  SA C5/6 Ax Deltoid 4+ 4+   EF C5/6 Mc Biceps 5 5   EE C6/7/8 Rad Triceps 5 5   WF C6/7 Med FCR     WE C7/8 PIN ECU     F Ab C8/T1 U ADM/FDI 5 5   HF L1/2/3 Fem Illopsoas 4+ 4+   KE L2/3/4 Fem Quad 5 5   DF L4/5 D Peron Tib Ant 5 5   PF S1/2 Tibial Grc/Sol 5 5    Sensation:  Light touch Intact throughout   Pin prick    Temperature    Vibration   Proprioception    Coordination/Complex Motor:  - Finger to Nose intact BL - Heel to shin intact BL - Rapid alternating movement  are slowed BL - Gait: deferred.  Labs/Imaging/Neurodiagnostic studies   CBC:  Recent Labs  Lab 2024-09-04 1151 08/31/24 0520  WBC 10.8* 9.6  HGB 12.8* 13.3  HCT 38.2* 39.8  MCV 96.7 96.4  PLT 241 213   Basic Metabolic Panel:  Lab Results  Component Value Date   NA 138 08/31/2024   K 3.9 08/31/2024   CO2 27 08/31/2024   GLUCOSE 95 08/31/2024   BUN 11 08/31/2024   CREATININE 0.54 (L) 08/31/2024   CALCIUM  8.6 (L) 08/31/2024   GFRNONAA >60 08/31/2024   Lipid Panel:  Lab Results  Component Value Date  LDLCALC 73 07/09/2024   HgbA1c:  Lab Results  Component Value Date   HGBA1C 6.0 07/09/2024   Urine Drug Screen: No results found for: LABOPIA, COCAINSCRNUR, LABBENZ, AMPHETMU, THCU, LABBARB  Alcohol Level No results found for: ETH INR No results found for: INR APTT No results found for: APTT AED levels: No results found for: PHENYTOIN, ZONISAMIDE, LAMOTRIGINE, LEVETIRACETA  MRI Brain(Personally reviewed): No acute abnormalities.  ASSESSMENT   Austin [name] is a 83 y.o. male with hx of asthma, depression, HTN, prostrate cancer, urinary incontinence, who initially presented with a ground level fall in the setting of pneumonia.  He was admitted to the hospital. He was noted to have trouble swallowing pills, and upon further discusion with family, had been having trouble swallowing and slurred speech x 2-3 months that is worse towards the end of the day. Significant difficulty with swallowing dinner than breakfast. Has been losing weight.  Neuro exam with speech trails off and becomes hypophonic. Slurring worsens as he continues to talk. No nasal tone. No objective diplopia but reports diplopia to sustained upgaze with weak neck flexion and weakness in BL shoulder abduction and BL hip flexion.  Clinical presentation is concerning for myasthenia gravis. Concern for crisis at this time. I gave him 2mg  of IV pyridostigmine  with significant improvement  in his dysarthria within 10 mins.  RECOMMENDATIONS  - IVIG 400mg /Kg every 24 hours x 5 doses - Every 6 hours NIFs and VC. Can be spaced out if stable. - NPO until swallow eval - AChR and MusK Ab. - CT chest with contrast to evaluate for thymoma. - Recommend elective intubation for respiratory compromise if VC falls below 15 to 20 mL/kg and or NIFs falls below -20cm/H2O. If poor effort and not sure, can always get ABG to assess for CO2 retention. Elective intubation for airway cmopromise if she has difficulty clearing her secretions. Oxygen saturation should not be used to make decision regarding intubation. - Will start Mestinon  PO 30mg  TID. If unable to swallow, can switch from PO to IV.(30mg  PO is equivalent to 1mg  IV). - Medications that may worsen or trigger MG exacerbation: Class IA antiarrhythmics, magnesium, flouroquinolones, macrolides, aminoglycosides, penicillamine, curare, interferon alpha, botox, quinine. Use with caution: calcium  channel blocker, beta blockers and statins. - I discontinued Lipitor for now.  ______________________________________________________________________  Plan discussed extensively with patient and wife at the bedside. I discussed mechanism of action of IVIG. Side effects including risk of allergic reaction, headache, kidney injury, small risk of stroke or MI or clots in his legs.  Plan discussed with Dr. Sundil and Dr. Mosie with the Hospitalist team over secure chat.  I personally spent a total of 75 minutes in the care of the patient today including preparing to see the patient, getting/reviewing separately obtained history, performing a medically appropriate exam/evaluation, counseling and educating, placing orders, referring and communicating with other health care professionals, documenting clinical information in the EHR, independently interpreting results, communicating results, and coordinating care.   Signed, Shelisha Gautier, MD [facility]

## 2024-09-01 NOTE — Progress Notes (Signed)
 FVC 1.28 NIF -26 MD at bedside and aware

## 2024-09-01 NOTE — Progress Notes (Signed)
 FVC 1.16 NIF -30  Done with good pt effort

## 2024-09-01 NOTE — Progress Notes (Signed)
 PROGRESS NOTE    Austin [name]  FMW:[id number] DOB: [birth year] DOA: 08/29/2024 PCP: [name], MD    Brief Narrative:  The patient is an 83 year old male with PMHx of asthma/COPD overlap syndrome, chronic rhinitis, recent oral thrush treated with fluconazole , HTN, HLD, depression, prostate cancer who presented to the ED on 08/29/2024 after an episode of dizziness and subsequent fall with trauma to the right side of head leading to a right parietal laceration, without loss of consciousness.  On initial evaluation in the ED, he reportedly denied lightheadedness or dizziness prior to falling, though he later endorsed several weeks of dizziness and unsteadiness while ambulating, requiring the use of a walker, and was later noted to be dizzy and unable to ambulate in the ED.  MRI was deferred at that time.  In the ED, he was afebrile with a temp of 98.6 F, HR 97, RR 19, BP 135/85, SpO2 99% on RA.  CBC was unremarkable with no leukocytosis.  CMP showed albumin of 2.9, lower than longstanding baseline.  UA was negative for UTI findings, showed moderate Hgb in the setting of known prostate cancer.  COVID/flu/RSV negative.  CT head showed scalp soft tissue injury.  CT cervical spine showed no acute traumatic injury.  He was noted to have crackles at the left lower lobe with CXR findings of peripheral left lung nodular opacity suspicious for multifocal pneumonia, for which he was started on Rocephin  and azithromycin  and placed up for admission. Notably, patient has been experiencing dysphagia with solids and dysarthria for approximately 3 weeks now, which were thought to be associated with oral thrust in the setting of Breztri  use, for which he was treated with two consecutive 7-day courses of fluconazole  by his outpatient pulmonologist with discontinuation of Breztri  in favor of lower dose Advair . Further, he has unintentional weight loss of approximately 26 lbs over the last 3 months.   Assessment and  Plan:  # Myasthenia Gravis with c/f myasthenic crisis  # Dysphagia  Dysarthria  Ataxia - Patient presents with multiple falls over last 3-4 weeks with new onset unsteadiness while ambulating necessitating the use of a walker, associated with some dizziness. Additionally, he has noted dysphagia and dysarthria around the same period of time, initially attributed to thrush but not resolving with treatment with 14 days of fluconazole  - MRI brain negative for acute intracranial abnormalities - SLP following for dysphagia and dysarthria/cognition - recommended dysphagia-2 with thin liquids, but concern for some underlying neurogenic component.  - TSH, Vit D, Vit B12 all wnl on outpatient labs from 07/09/2024 - Folate wnl - RPR pending, Vit B1 level pending - PT/OT following - Neuro consulted - indicated clinical presentation most consistent with MG with concern for myasthenic crisis. Was noted to have significant improvement with dysarthria after administration of IV pyridostigmine  2 mg. Continue with neuro recommendations as follows: - IVIG 400mg /Kg every 24 hours x 5 doses - Every 6 hours NIFs and VC. Can be spaced out if stable. - AChR and MusK Ab pending - CT chest with contrast to evaluate for thymoma. 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  Was started on IV Rocephin  and azithromycin  and admitted for CAP - However, patient is afebrile with no leukocytosis and no cough.  Clinically,  very low suspicion for pneumonia. - Procalcitonin <0.1, further supporting unlikely diagnosis of bacterial pneumonia - Of note, the patient recently underwent a PET scan on 08/20/2024 which noted patchy ground glass densities in both lungs suggestive of edema or inflammation.  Outpatient pulmonology ordered a high-resolution CT scan for further characterization of the pulmonary findings on PET scan, which was scheduled on the same day of his admission - Discontinued IV Rocephin  and azithromycin  - CT chest (11/20) showed subpleural fibrosis with traction bronchiectasis and superimposed left lower lobe ground glass/consolidation concerning for an underlying interstitial lung disease, most consistent with UIP - Will defer further advanced imaging/workup to outpatient pulmonology   # Ground-level falls # Right parietal scalp laceration and hematoma - Presented after GLF in the bathroom, reportedly due to loss of balance, with trauma to head - CT head showed right posterior convexity scalp hematoma and laceration with confluent soft tissue thickening and mild soft tissue gas, underlying calvarium intact with no skull fracture. - CT cervical spine showed no acute traumatic injury, noted hyperostosis and ankylosis at C6-C7 with advanced upper cervical facet arthropathy - Underwent laceration repair in the ED  # History prostate cancer # Unintentional weight loss # Severe protein calorie malnutrition - Per review of records, the patient has lost ~ 26 lbs when comparing recorded weights on record from 8/6 and 11/12 - This is somewhat concerning in the setting of history prostate cancer - PET scan 11/12 showed intense intraprostatic activity favored to represent radioactive urine within the prostatic urethra, no evidence of metastatic adenopathy in the abdomen and pelvis, no distant metastatic prostate cancer noted. - RD following - recommended addition of appetite stimulant, Ensure Plus high-protein,  multivitamin, thiamine  supplementation, assist with feeding - Continue megace  160 mg daily - Continue thiamine  100 mg for 5 days and multivitamin  # Asthma/COPD overlap syndrome - Follows closely with Dr. Meade in outpatient pulmonology - Continue home Advair  HFA with formulary alternative Breo Ellipta  - PRN DuoNebs  # Chronic rhinitis - Continue home Singulair  10 mg at bedtime  # Hypertension - Continue home amlodipine  5 mg daily and losartan  100 mg daily  #MDD - Continue home Wellbutrin  300 mg daily  #HLD - Home Lipitor held given myasthenic crisis  # GERD - Continue PPI formulary alternative   DVT prophylaxis: enoxaparin  (LOVENOX ) injection 40 mg Start: 08/29/24 1400   Code Status:   Code Status: Full Code  Family Communication: Discussed with patient's wife, [name], over the phone  Disposition Plan: SNF for rehab pending completion of workup  PT - Follow Up Recommendations: Skilled nursing-short term rehab (<3 hours/day) OT - Follow Up Recommendations: Skilled nursing-short term rehab (<3 hours/day)  DME Needs: PT equipment: BSC/3in1    Level of care: Med-Surg  Consultants:  None  Procedures:  None  Antimicrobials: Rocephin  and azithromycin    Subjective: Patient evaluated at bedside today. Has no complaints. Tolerating PO intake with feeding assist. Denies fevers, chest pain, SOB, chills, cough.   Objective: Vitals:   08/31/24 1011 08/31/24 1815 08/31/24 2012 09/01/24 0533  BP: 122/74 121/69 121/66 133/75  Pulse: (!) 104 99 98 96  Resp: 17 20 17 18   Temp: 97.7 F (36.5 C) (!) 97.3 F (  36.3 C) 98 F (36.7 C) (!) 97.5 F (36.4 C)  TempSrc: Oral Oral Oral Oral  SpO2: 98% 95% 94% 95%  Weight:      Height:        Intake/Output Summary (Last 24 hours) at 09/01/2024 0707 Last data filed at 09/01/2024 0500 Gross per 24 hour  Intake 1917.02 ml  Output 1200 ml  Net 717.02 ml   Filed Weights   08/29/24 1524  Weight: 70 kg     Examination:  Gen: NAD, A&Ox3 HEENT: NCAT, EOMI Neck: Supple, no JVD, no LAD CV: RRR, no murmurs Resp: normal WOB, rales noted at left lung base, other lung field clear to ausculation. Abd: Soft, NTND, no guarding, BS normoactive Ext: No LE edema, pulses 2+ b/l Skin: Warm, dry, no rashes/lesions Neuro: dysarthric, 4/5 BLE strength, 5/5 BUE strength, gait testing deferred Psych: Calm, cooperative, appropriate affect   Data Reviewed: I have personally reviewed following labs and imaging studies  CBC: Recent Labs  Lab 08/29/24 0740 08/30/24 1151 08/31/24 0520  WBC 7.9 10.8* 9.6  HGB 14.6 12.8* 13.3  HCT 42.6 38.2* 39.8  MCV 96.2 96.7 96.4  PLT 242 241 213   Basic Metabolic Panel: Recent Labs  Lab 08/29/24 0740 08/30/24 1151 08/31/24 0520  NA 141 137 138  K 3.7 4.0 3.9  CL 103 102 103  CO2 28 28 27   GLUCOSE 109* 117* 95  BUN 9 12 11   CREATININE 0.46* 0.59* 0.54*  CALCIUM  9.3 8.7* 8.6*   GFR: Estimated Creatinine Clearance: 69.3 mL/min (A) (by C-G formula based on SCr of 0.54 mg/dL (L)). Liver Function Tests: Recent Labs  Lab 08/29/24 0740  AST 28  ALT 29  ALKPHOS 89  BILITOT 0.9  PROT 6.6  ALBUMIN 2.9*   No results for input(s): LIPASE, AMYLASE in the last 168 hours. No results for input(s): AMMONIA in the last 168 hours. Coagulation Profile: No results for input(s): INR, PROTIME in the last 168 hours. Cardiac Enzymes: No results for input(s): CKTOTAL, CKMB, CKMBINDEX, TROPONINI in the last 168 hours. BNP (last 3 results) No results for input(s): PROBNP in the last 8760 hours. HbA1C: No results for input(s): HGBA1C in the last 72 hours. CBG: Recent Labs  Lab 08/29/24 0737  GLUCAP 109*   Lipid Profile: No results for input(s): CHOL, HDL, LDLCALC, TRIG, CHOLHDL, LDLDIRECT in the last 72 hours. Thyroid  Function Tests: No results for input(s): TSH, T4TOTAL, FREET4, T3FREE, THYROIDAB in the last 72  hours. Anemia Panel: Recent Labs    08/31/24 0520 08/31/24 1452  VITAMINB12  --  891  FOLATE 16.1  --    Sepsis Labs: Recent Labs  Lab 08/30/24 1151  PROCALCITON <0.10    Recent Results (from the past 240 hours)  Resp panel by RT-PCR (RSV, Flu A&B, Covid) Anterior Nasal Swab     Status: None   Collection Time: 08/29/24  1:13 PM   Specimen: Anterior Nasal Swab  Result Value Ref Range Status   SARS Coronavirus 2 by RT PCR NEGATIVE NEGATIVE Final   Influenza A by PCR NEGATIVE NEGATIVE Final   Influenza B by PCR NEGATIVE NEGATIVE Final    Comment: (NOTE) The Xpert Xpress SARS-CoV-2/FLU/RSV plus assay is intended as an aid in the diagnosis of influenza from Nasopharyngeal swab specimens and should not be used as a sole basis for treatment. Nasal washings and aspirates are unacceptable for Xpert Xpress SARS-CoV-2/FLU/RSV testing.  Fact Sheet for Patients: bloggercourse.com  Fact Sheet for Healthcare Providers: seriousbroker.it  This test is not yet approved or cleared by the [place]  FDA and has been authorized for detection and/or diagnosis of SARS-CoV-2 by FDA under an Emergency Use Authorization (EUA). This EUA will remain in effect (meaning this test can be used) for the duration of the COVID-19 declaration under Section 564(b)(1) of the Act, 21 U.S.C. section 360bbb-3(b)(1), unless the authorization is terminated or revoked.     Resp Syncytial Virus by PCR NEGATIVE NEGATIVE Final    Comment: (NOTE) Fact Sheet for Patients: bloggercourse.com  Fact Sheet for Healthcare Providers: seriousbroker.it  This test is not yet approved or cleared by the [place]  FDA and has been authorized for detection and/or diagnosis of SARS-CoV-2 by FDA under an Emergency Use Authorization (EUA). This EUA will remain in effect (meaning this test can be used) for the duration of  the COVID-19 declaration under Section 564(b)(1) of the Act, 21 U.S.C. section 360bbb-3(b)(1), unless the authorization is terminated or revoked.  Performed at [facility], [address] [place], [place] [address]      Radiology Studies: DG Swallowing Func-Speech Pathology Result Date: 08/31/2024 Table formatting from the original result was not included. Modified Barium Swallow Study Patient Details Name: Damany Eastman MRN: [id number] Date of Birth: 01/02/1941 Today's Date: 08/31/2024 HPI/PMH: HPI: Mr. Goetze is an 83 yo presenting 11/19 after GLF, also found to have multifocal CAP. CT Head showed scalp soft tissue injury and otherwise normal for age appearance of the brain. 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 Swallow eval was ordered due to pt having difficulty swallowing pills, reporting dysphagia for three weeks PTA. He was being treated for thrush by his pulmonologist, and GI referral was being considered if symptoms persisted after treatment. Pt also described dysarthria and weakness but MRI Brain 11/21 with no acute intracranial abnormality. 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 Airway protection is intact. The strategies most effective in reducing pharyngeal residue were second swallows and liquid washes. Pt prefers to stay on Dys 2 (finely chopped) diet and thin liquids with meds whole in puree. Discussed findings with MD including potential neurogenic component. May want to consider neurology consult considering acute onset of dysarthria, dysphagia, and cognitive changes. Orally, pt has some extra lingual movements prior to initiation of posterior propulsion, but once initiated, movement is  swift. Oral residue is small in volume and cleared spontaneously with a second swallow. There is more evidence of a pharyngeal dysphagia, including reduced anterior hyoid movement, pharyngeal squeeze, and PES opening, which result in moderate amounts of residue. This is noted throughout the pharynx, but especially in the valleculae. Presence of suspected osteophytes may also be contributing. Several positional strategies were attempted, including a chin tuck and head turns to both sides, but there was no significant effect. Pt reduced, but did not clear residue, when he used either second swallows or a liquid wash. Pt was able to protect his airway throughout self-feeding with trace, transient penetration noted with a straw (PAS 2, considered to be normal) but no aspiration observed. Factors that may increase risk of adverse event in presence of aspiration Noe & Lianne 2021): Factors that may increase risk of adverse event in presence of aspiration Noe & Lianne 2021): Respiratory or GI disease; Reduced cognitive function; Limited mobility; Frail  or deconditioned; Reduced saliva Recommendations/Plan: Swallowing Evaluation Recommendations Swallowing Evaluation Recommendations Recommendations: PO diet PO Diet Recommendation: Dysphagia 2 (Finely chopped); Thin liquids (Level 0) Liquid Administration via: Cup; Straw Medication Administration: Whole meds with puree Supervision: Patient able to self-feed; Intermittent supervision/cueing for swallowing strategies Swallowing strategies  : Slow rate; Small bites/sips; Follow solids with liquids; Multiple dry swallows after each bite/sip Postural changes: Position pt fully upright for meals; Stay upright 30-60 min after meals Oral care recommendations: Oral care BID (2x/day) Recommended consults: Other(comment) (consider neuro consult) Treatment Plan Treatment Plan Treatment recommendations: Therapy as outlined in treatment plan below Follow-up recommendations:  Skilled nursing-short term rehab (<3 hours/day) Functional status assessment: Patient has had a recent decline in their functional status and demonstrates the ability to make significant improvements in function in a reasonable and predictable amount of time. Treatment frequency: Min 2x/week Treatment duration: 2 weeks Interventions: Aspiration precaution training; Oropharyngeal exercises; Compensatory techniques; Patient/family education; Trials of upgraded texture/liquids; Diet toleration management by SLP Recommendations Recommendations for follow up therapy are one component of a multi-disciplinary discharge planning process, led by the attending physician.  Recommendations may be updated based on patient status, additional functional criteria and insurance authorization. Assessment: Orofacial Exam: Orofacial Exam Oral Cavity - Dentition: Adequate natural dentition Oral Motor/Sensory Function: WFL Anatomy: Anatomy: Suspected cervical osteophytes Boluses Administered: Boluses Administered Boluses Administered: Thin liquids (Level 0); Mildly thick liquids (Level 2, nectar thick); Moderately thick liquids (Level 3, honey thick); Puree; Solid  Oral Impairment Domain: Oral Impairment Domain Lip Closure: No labial escape Tongue control during bolus hold: Cohesive bolus between tongue to palatal seal Bolus preparation/mastication: Timely and efficient chewing and mashing Bolus transport/lingual motion: Brisk tongue motion Oral residue: Residue collection on oral structures Location of oral residue : Tongue Initiation of pharyngeal swallow : Posterior angle of the ramus  Pharyngeal Impairment Domain: Pharyngeal Impairment Domain Soft palate elevation: No bolus between soft palate (SP)/pharyngeal wall (PW) Laryngeal elevation: Complete superior movement of thyroid  cartilage with complete approximation of arytenoids to epiglottic petiole Anterior hyoid excursion: Partial anterior movement Epiglottic movement: Complete  inversion Laryngeal vestibule closure: Complete, no air/contrast in laryngeal vestibule Pharyngeal stripping wave : Present - diminished Pharyngeal contraction (A/P view only): N/A Pharyngoesophageal segment opening: Partial distention/partial duration, partial obstruction of flow Tongue base retraction: Trace column of contrast or air between tongue base and PPW Pharyngeal residue: Collection of residue within or on pharyngeal structures Location of pharyngeal residue: Valleculae; Pyriform sinuses  Esophageal Impairment Domain: Esophageal Impairment Domain Esophageal clearance upright position: Esophageal retention Pill: Pill Consistency administered: Puree Puree: WFL Penetration/Aspiration Scale Score: Penetration/Aspiration Scale Score 1.  Material does not enter airway: Mildly thick liquids (Level 2, nectar thick); Moderately thick liquids (Level 3, honey thick); Puree; Solid; Pill 2.  Material enters airway, remains ABOVE vocal cords then ejected out: Thin liquids (Level 0) Compensatory Strategies: Compensatory Strategies Compensatory strategies: Yes Straw: Effective Effective Straw: Thin liquid (Level 0) Multiple swallows: Effective Effective Multiple Swallows: Thin liquid (Level 0); Puree; Solid Chin tuck: Ineffective Ineffective Chin Tuck: Puree Liquid wash: Effective Effective Liquid Wash: Puree; Solid Left head turn: Ineffective Ineffective Left Head Turn: Puree Right head turn: Ineffective Ineffective Right Head Turn: Puree   General Information: Caregiver present: No  Diet Prior to this Study: Dysphagia 2 (finely chopped); Thin liquids (Level 0)   Temperature : Normal   Respiratory Status: WFL   Supplemental O2: None (Room air)   History of Recent Intubation: No  Behavior/Cognition: Alert; Pleasant mood; Cooperative Self-Feeding  Abilities: Able to self-feed Baseline vocal quality/speech: Hypophonia/low volume (mild) No data recorded Volitional Swallow: Able to elicit Exam Limitations: No limitations Goal  Planning: Prognosis for improved oropharyngeal function: Good No data recorded No data recorded Patient/Family Stated Goal: none stated Consulted and agree with results and recommendations: Patient; Physician Pain: Pain Assessment Pain Assessment: Faces Faces Pain Scale: 0 Pain Location: right hip Pain Descriptors / Indicators: Aching; Grimacing Pain Intervention(s): Monitored during session End of Session: Start Time:SLP Start Time (ACUTE ONLY): 1339 Stop Time: SLP Stop Time (ACUTE ONLY): 1358 Time Calculation:SLP Time Calculation (min) (ACUTE ONLY): 19 min Charges: SLP Evaluations $ SLP Speech Visit: 1 Visit SLP Evaluations $BSS Swallow: 1 Procedure $MBS Swallow: 1 Procedure $ SLP EVAL LANGUAGE/SOUND PRODUCTION: 1 Procedure $Swallowing Treatment: 1 Procedure SLP visit diagnosis: SLP Visit Diagnosis: Dysphagia, unspecified (R13.10) Past Medical History: Past Medical History: Diagnosis Date  Asthma   Depression   Hypertension   Prostate cancer (HCC)   Urinary incontinence  Past Surgical History: Past Surgical History: Procedure Laterality Date  GALLBLADDER SURGERY  1997  REPLACEMENT TOTAL KNEE BILATERAL  2001  2007  surgery replacement Bilateral 2005  TOTAL SHOULDER REPLACEMENT Bilateral 2000 Leita SAILOR., M.A. CCC-SLP Acute Rehabilitation Services Office: [phone] Secure chat preferred 08/31/2024, 3:17 PM  MR BRAIN WO CONTRAST Result Date: 08/30/2024 EXAM: MRI BRAIN WITHOUT CONTRAST 08/30/2024 11:05:00 PM TECHNIQUE: Multiplanar multisequence MRI of the head/brain was performed without the administration of intravenous contrast. COMPARISON: CT head 08/29/2024. CLINICAL HISTORY: Dysarthria, gait unsteadiness, dizziness FINDINGS: BRAIN AND VENTRICLES: No acute infarct. No intracranial hemorrhage. No mass. No midline shift. No hydrocephalus. Normal flow voids. ORBITS: No acute abnormality. SINUSES AND MASTOIDS: No acute abnormality. BONES AND SOFT TISSUES: Normal marrow signal. No acute soft tissue abnormality.  IMPRESSION: 1.No acute intracranial abnormality. Electronically signed by: Gilmore Molt MD 08/30/2024 11:55 PM EST RP Workstation: [id number]   CT CHEST WO CONTRAST Result Date: 08/30/2024 EXAM: CT CHEST WITHOUT CONTRAST 08/30/2024 06:19:08 PM TECHNIQUE: CT of the chest was performed without the administration of intravenous contrast. Multiplanar reformatted images are provided for review. Automated exposure control, iterative reconstruction, and/or weight based adjustment of the mA/kV was utilized to reduce the radiation dose to as low as reasonably achievable. COMPARISON: Chest radiograph of 08/29/2024 and PET CT examination of 08/16/2024. CLINICAL HISTORY: Abnormal xray - lung opacity/opacities. Prostate cancer. *tracking code: Bo* FINDINGS: MEDIASTINUM: Heart: Extensive multivessel coronary artery calcifications. Calcification of the aortic valve leaflets. Global cardiac size within normal limits. No pericardial effusion. The central airways are clear. No central obstructing lesion. Vasculature: Central pulmonary arteries are of normal caliber. Mild atherosclerotic calcification within the thoracic aorta. 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 Recommend annual imaging followup by CTA or MRA. This recommendation follows 2010 ACCF/AHA/AATS/ACR/ASA/SCA/SCAI/SIR/STS/SVM guidelines for the diagnosis and management of patients with thoracic aortic disease. Circulation. 2010; 121: Z733-z630. Aortic aneurysm NOS (ICD10-I71.9). LYMPH NODES: No mediastinal, hilar or axillary lymphadenopathy. LUNGS AND PLEURA: Subpleural reticulation, bronchiolectasis, and architectural distortion is present in keeping with changes of subpleural fibrotic change. There is superimposed ground-glass infiltrate and consolidation within the left lower lobe with associated traction bronchiectasis. Together, the findings suggest an underlying interstitial process, probable UIP or, less  likely, fibrotic NSIP. No pneumothorax or pleural effusion. SOFT TISSUES/BONES: Osseous structures are age appropriate. No acute bone abnormality. No lytic or blastic bone lesion. No acute abnormality of the soft tissues. UPPER ABDOMEN: Limited images of the upper abdomen demonstrates no acute abnormality. IMPRESSION: 1. Subpleural  fibrotic change with superimposed ground-glass infiltrate and consolidation within the left lower lobe, associated with traction bronchiectasis, suggestive of an underlying interstitial process, probable UIP or, less likely, fibrotic NSIP. Comparison with prior examinations will be helpful for further evaluation. If none are available, follow-up high-resolution CT imaging of the chest in 6 months would be helpful . 2. 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 Recommend annual imaging follow-up by CTA or MRA. This recommendation follows 2010 ACCF/AHA/AATS/ACR/ASA/SCA/SCAI/SIR/STS/SVM guidelines for the diagnosis and management of patients with thoracic aortic disease. Circulation. 2010;121:E266-E369. Aortic aneurysm NOS (ICD10-I71.9). 3. 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 Electronically signed by: Dorethia Molt MD 08/30/2024 08:45 PM EST RP Workstation: [id number]    Scheduled Meds:  buPROPion   300 mg Oral Daily   enoxaparin  (LOVENOX ) injection  40 mg Subcutaneous Q24H   feeding supplement  237 mL Oral BID BM   fluticasone  furoate-vilanterol  1 puff Inhalation Daily   megestrol   160 mg Oral Daily   montelukast   10 mg Oral QHS   multivitamin with minerals  1 tablet Oral Daily   pantoprazole   80 mg Oral Daily   pyridostigmine   30 mg Oral Q8H   Or   pyridostigmine   1 mg Intravenous Q8H   thiamine   100 mg Oral Daily   Continuous Infusions:  cefTRIAXone  (ROCEPHIN )  IV 1 g (08/31/24 0818)   Immune Globulin  10%       LOS:  LOS: 3 days   Time Spent: 45  minutes  Unresulted Labs (From admission, onward)     Start     Ordered   09/05/24 0500  Creatinine, serum  (enoxaparin  (LOVENOX )    CrCl >/= 30 ml/min)  Weekly,   R     Comments: while on enoxaparin  therapy    08/29/24 1339   09/01/24 0645  MuSK Antibodies  Once,   R        09/01/24 0644   09/01/24 0644  Acetylcholine receptor, binding  Once,   R        09/01/24 0644   08/31/24 1739  Vitamin B1  Add-on,   AD        08/31/24 1738   08/31/24 1410  RPR  Once,   R        08/31/24 1410   08/30/24 1106  CBC  Daily,   R      08/30/24 1105   08/30/24 1106  Basic metabolic panel with GFR  Daily,   R      08/30/24 1105             Nitara Szczerba Al-Sultani, MD [facility]  If 7PM-7AM, please contact night-coverage  09/01/2024, 7:07 AM

## 2024-09-01 NOTE — Progress Notes (Signed)
   09/01/24 1425  Assess: MEWS Score  Temp (!) 97.5 F (36.4 C)  BP 127/70  MAP (mmHg) 87  Pulse Rate (!) 107  Resp (!) 26  Level of Consciousness Alert  SpO2 94 %  O2 Device Room Air  Assess: MEWS Score  MEWS Temp 0  MEWS Systolic 0  MEWS Pulse 1  MEWS RR 2  MEWS LOC 0  MEWS Score 3  MEWS Score Color Yellow  Assess: if the MEWS score is Yellow or Red  Were vital signs accurate and taken at a resting state? Yes  MEWS guidelines implemented  Yes, yellow  Treat  MEWS Interventions Considered administering scheduled or prn medications/treatments as ordered  Take Vital Signs  Increase Vital Sign Frequency  Yellow: Q2hr x1, continue Q4hrs until patient remains green for 12hrs  Escalate  MEWS: Escalate Yellow: Discuss with charge nurse and consider notifying provider and/or RRT  Notify: Charge Nurse/RN  Name of Charge Nurse/RN Notified Kate Ada, RN  Assess: SIRS CRITERIA  SIRS Temperature  0  SIRS Respirations  1  SIRS Pulse 1  SIRS WBC 0  SIRS Score Sum  2   Pt receiving IVIG and nurse, Hadassah is in the room monitoring the pt.

## 2024-09-01 NOTE — Progress Notes (Signed)
 FVC 1.83 w/ fair tech  NIF -28

## 2024-09-01 NOTE — Progress Notes (Signed)
 IGIV infused. RR were in high 20s. Some accessory muscle use noted, but pt stated it is how he breathes, especially during movements. RT at some point was at the bedside, pulmonary tests were done, reported crackles. No wheezing. Pt UO was 230cc during infusion. (Not sure about spilling). Otherwise, no changes during infusion and no distress. Drip was adjusted per order/pharmacy. VS stable MD notified. At the bedside. New orders for xray, re testing pulm and Lasix  were placed, as well as transfer to progressive. Family was updated, all questions were answered. Charge RN is aware as well.

## 2024-09-02 DIAGNOSIS — G7000 Myasthenia gravis without (acute) exacerbation: Secondary | ICD-10-CM | POA: Diagnosis not present

## 2024-09-02 LAB — BASIC METABOLIC PANEL WITH GFR
Anion gap: 8 (ref 5–15)
BUN: 14 mg/dL (ref 8–23)
CO2: 25 mmol/L (ref 22–32)
Calcium: 8.2 mg/dL — ABNORMAL LOW (ref 8.9–10.3)
Chloride: 103 mmol/L (ref 98–111)
Creatinine, Ser: 0.46 mg/dL — ABNORMAL LOW (ref 0.61–1.24)
GFR, Estimated: >60 mL/min (ref >60)
Glucose, Bld: 87 mg/dL (ref 70–99)
Potassium: 3.6 mmol/L (ref 3.5–5.1)
Sodium: 136 mmol/L (ref 135–145)

## 2024-09-02 LAB — CBC
HCT: 35 % — ABNORMAL LOW (ref 39.0–52.0)
Hemoglobin: 11.8 g/dL — ABNORMAL LOW (ref 13.0–17.0)
MCH: 32.4 pg (ref 26.0–34.0)
MCHC: 33.7 g/dL (ref 30.0–36.0)
MCV: 96.2 fL (ref 80.0–100.0)
Platelets: 202 K/uL (ref 150–400)
RBC: 3.64 MIL/uL — ABNORMAL LOW (ref 4.22–5.81)
RDW: 14 % (ref 11.5–15.5)
WBC: 8.2 K/uL (ref 4.0–10.5)
nRBC: 0 % (ref 0.0–0.2)

## 2024-09-02 LAB — VITAMIN B1: Vitamin B1 (Thiamine): 122.9 nmol/L (ref 66.5–200.0)

## 2024-09-02 MED ORDER — FUROSEMIDE 10 MG/ML IJ SOLN: 20.0000 mg | Freq: Every day | INTRAMUSCULAR | Status: DC | PRN

## 2024-09-02 MED ORDER — FUROSEMIDE 10 MG/ML IJ SOLN
20.0000 mg | Freq: Once | INTRAMUSCULAR | Status: AC | PRN
  Administered 2024-09-03: 20 mg via INTRAVENOUS
  Filled 2024-09-02: qty 2

## 2024-09-02 MED ORDER — ACETAMINOPHEN 325 MG PO TABS
650.0000 mg | ORAL_TABLET | Freq: Once | ORAL | Status: AC
  Administered 2024-09-02: 650 mg via ORAL
  Filled 2024-09-02: qty 2

## 2024-09-02 MED ORDER — DIPHENHYDRAMINE HCL 25 MG PO CAPS
50.0000 mg | ORAL_CAPSULE | Freq: Once | ORAL | Status: AC
  Administered 2024-09-02: 50 mg via ORAL
  Filled 2024-09-02: qty 2

## 2024-09-02 NOTE — Progress Notes (Signed)
 NEUROLOGY CONSULT FOLLOW UP NOTE   Date of service: September 02, 2024 Patient Name: Austin [name] MRN:  [id number] DOB:  09/02/1941  Interval Hx/subjective   Sitting up in bed. No change in exam. Tolerated 2nd session of IVIG without complications.  Vitals   Vitals:   09/02/24 1100 09/02/24 1200 09/02/24 1207 09/02/24 1220  BP:   (!) 146/78   Pulse: 99 95 (!) 101 (!) 104  Resp:   18   Temp:   97.6 F (36.4 C)   TempSrc:   Oral   SpO2: 93% 92% 91% 93%  Weight:      Height:         Body mass index is 22.79 kg/m.  Physical Exam   Constitutional: Appears acutely ill and weak.    Neurologic Examination   Neuro: Mental Status: Patient is awake, alert, oriented to person, place, month, year, and situation. Patient is able to give a clear and coherent history. No signs of aphasia or neglect Cranial Nerves: II: Visual Fields are full. Pupils are equal, round, and reactive to light.   III,IV, VI: EOMI without ptosis. Reported diplopia with sustained upward gaze. No pain with EOM.  V: Facial sensation is symmetric to light touch VII: Facial movement is symmetric.  VIII: hearing is intact to voice X: Uvula elevates symmetrically XI: Shoulder shrug is symmetric. XII: tongue is midline without atrophy or fasciculations.  Motor: Tone is normal. Bulk is reduced.  RUE/RLE: 4+/5 proximal/shoulder, 5/5 distal/wrist RLE/LLE: 4+/5 proximal/hip, 4+/5 knee, 5/5 ankle Sensory: Sensation is symmetric to light touch in the arms and legs. Cerebellar: FNF and HKS are slow but intact bilaterally  Medications  Current Facility-Administered Medications:    buPROPion  (WELLBUTRIN  XL) 24 hr tablet 300 mg, 300 mg, Oral, Daily, Georgina Basket, MD, 300 mg at 09/02/24 1032   enoxaparin  (LOVENOX ) injection 40 mg, 40 mg, Subcutaneous, Q24H, Georgina Basket, MD, 40 mg at 09/01/24 1444   feeding supplement (ENSURE PLUS HIGH PROTEIN) liquid 237 mL, 237 mL, Oral, BID BM, Al-Sultani, Anmar, MD, 237 mL  at 09/01/24 1448   fluticasone  furoate-vilanterol (BREO ELLIPTA ) 100-25 MCG/ACT 1 puff, 1 puff, Inhalation, Daily, Georgina Basket, MD, 1 puff at 09/02/24 9166   furosemide  (LASIX ) injection 20 mg, 20 mg, Intravenous, Once PRN, Al-Sultani, Anmar, MD   Immune Globulin  10% (PRIVIGEN ) IV infusion 30 g, 400 mg/kg, Intravenous, Q24 Hr x 5, Khaliqdina, Salman, MD, Stopped at 09/01/24 1600   ipratropium-albuterol  (DUONEB) 0.5-2.5 (3) MG/3ML nebulizer solution 3 mL, 3 mL, Nebulization, Q6H PRN, Georgina Basket, MD   megestrol  (MEGACE ) tablet 160 mg, 160 mg, Oral, Daily, Al-Sultani, Anmar, MD, 160 mg at 09/02/24 1034   montelukast  (SINGULAIR ) tablet 10 mg, 10 mg, Oral, QHS, Al-Sultani, Anmar, MD, 10 mg at 09/01/24 2109   multivitamin with minerals tablet 1 tablet, 1 tablet, Oral, Daily, Al-Sultani, Anmar, MD, 1 tablet at 09/02/24 1033   pantoprazole  (PROTONIX ) EC tablet 80 mg, 80 mg, Oral, Daily, Al-Sultani, Anmar, MD, 80 mg at 09/02/24 1033   pyridostigmine  (MESTINON ) tablet 30 mg, 30 mg, Oral, Q8H, 30 mg at 09/01/24 1444 **OR** pyridostigmine  (MESTINON ) injection 1 mg, 1 mg, Intravenous, Q8H, Khaliqdina, Salman, MD, 1 mg at 09/02/24 9380   thiamine  (VITAMIN B1) tablet 100 mg, 100 mg, Oral, Daily, Al-Sultani, Anmar, MD, 100 mg at 09/02/24 1033  Labs and Diagnostic Imaging   CBC:  Recent Labs  Lab 09/01/24 0849 09/02/24 0329  WBC 10.0 8.2  HGB 12.6* 11.8*  HCT 37.8* 35.0*  MCV 97.2  96.2  PLT 217 202    Basic Metabolic Panel:  Lab Results  Component Value Date   NA 136 09/02/2024   K 3.6 09/02/2024   CO2 25 09/02/2024   GLUCOSE 87 09/02/2024   BUN 14 09/02/2024   CREATININE 0.46 (L) 09/02/2024   CALCIUM  8.2 (L) 09/02/2024   GFRNONAA >60 09/02/2024   Lipid Panel:  Lab Results  Component Value Date   LDLCALC 73 07/09/2024   HgbA1c:  Lab Results  Component Value Date   HGBA1C 6.0 07/09/2024   Urine Drug Screen: No results found for: LABOPIA, COCAINSCRNUR, LABBENZ, AMPHETMU,  THCU, LABBARB  Alcohol Level No results found for: ETH INR No results found for: INR APTT No results found for: APTT AED levels: No results found for: PHENYTOIN, ZONISAMIDE, LAMOTRIGINE, LEVETIRACETA   MRI Brain(Personally reviewed): No acute abnormalities.    Assessment   Austin [name] is a 83 y.o. male with hx of asthma, depression, HTN, prostrate cancer, urinary incontinence, who initially presented with a ground level fall in the setting of pneumonia, admitted for further workup.   He was noted to have trouble swallowing pills, and upon further discusion with family, had been having trouble swallowing and slurred speech x 2-3 months that is worse towards the end of the day and has been losing weight.   Neuro exam with speech trails off and becomes hypophonic. Slurring worsens as he continues to talk. No nasal tone. No objective diplopia but reports diplopia to sustained upgaze. Weakness more prevalent proximal rather than distally in all extremities.    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 He was given 2mg  of IV pyridostigmine  with significant improvement in his dysarthria within 10 mins.  Recommendations  - Continue IVIG 400mg /Kg every 24 hours x 5 doses - Every 6 hours NIFs and VC. Can be spaced out if stable. - NPO until swallow eval - AChR and MusK Ab. - Continue Mestinon  PO 30mg  TID. If unable to swallow, can switch from PO to IV.(30mg  PO is equivalent to 1mg  IV). - CT chest with contrast to evaluate for thymoma.   - Recommend elective intubation for respiratory compromise if VC falls below 15 to 20 mL/kg and or NIFs falls below -20cm/H2O. If poor effort and not sure, can always get ABG to assess for CO2 retention. Elective intubation for airway cmopromise if she has difficulty clearing her secretions. Oxygen saturation should not be used to make decision regarding intubation.  - Medications that may worsen or  trigger MG exacerbation: Class IA antiarrhythmics, magnesium, flouroquinolones, macrolides, aminoglycosides, penicillamine, curare, interferon alpha, botox, quinine. Use with caution: calcium  channel blocker, beta blockers and statins.  ______________________________________________________________________   Bonney Rocky JAYSON Judithe, NP [facility]    Attending Neurohospitalist Addendum Patient seen and examined with APP/Resident. Agree with the history and physical as documented above. Agree with the plan as documented, which I helped formulate. I have edited the note above to reflect my full findings and recommendations. I have independently reviewed the chart, obtained history, review of systems and examined the patient.I have personally reviewed pertinent head/neck/spine imaging (CT/MRI). Please feel free to call with any questions.  -- Elida Ross, MD [facility] [phone]  If 7pm- 7am, please page neurology on call as listed in AMION.

## 2024-09-02 NOTE — Progress Notes (Signed)
 FVC 1.4L NIF-30

## 2024-09-02 NOTE — Progress Notes (Addendum)
 PROGRESS NOTE    Austin [name]  FMW:[id number] DOB: 09/04/1941 DOA: 08/29/2024 PCP: [name], MD    Brief Narrative:  The patient is an 83 year old male with PMHx of asthma/COPD overlap syndrome, chronic rhinitis, recent oral thrush treated with fluconazole , HTN, HLD, depression, prostate cancer who presented to the ED on 08/29/2024 after an episode of dizziness and subsequent fall with trauma to the right side of head leading to a right parietal laceration, without loss of consciousness.  On initial evaluation in the ED, he reportedly denied lightheadedness or dizziness prior to falling, though he later endorsed several weeks of dizziness and unsteadiness while ambulating, requiring the use of a walker, and was later noted to be dizzy and unable to ambulate in the ED.  MRI was deferred at that time.  In the ED, he was afebrile with a temp of 98.6 F, HR 97, RR 19, BP 135/85, SpO2 99% on RA.  CBC was unremarkable with no leukocytosis.  CMP showed albumin of 2.9, lower than longstanding baseline.  UA was negative for UTI findings, showed moderate Hgb in the setting of known prostate cancer.  COVID/flu/RSV negative.  CT head showed scalp soft tissue injury.  CT cervical spine showed no acute traumatic injury.  He was noted to have crackles at the left lower lobe with CXR findings of peripheral left lung nodular opacity suspicious for multifocal pneumonia, for which he was started on Rocephin  and azithromycin  and placed up for admission. Notably, patient has been experiencing dysphagia with solids and dysarthria for approximately 3 weeks now, which were thought to be associated with oral thrust in the setting of Breztri  use, for which he was treated with two consecutive 7-day courses of fluconazole  by his outpatient pulmonologist with discontinuation of Breztri  in favor of lower dose Advair . Further, he has unintentional weight loss of approximately 26 lbs over the last 3 months.   Assessment and  Plan:  # Myasthenia Gravis with c/f myasthenic crisis  # Dysphagia  Dysarthria  Ataxia - Patient presents with multiple falls over last 3-4 weeks with new onset unsteadiness while ambulating necessitating the use of a walker, associated with some dizziness. Additionally, he has noted dysphagia and dysarthria around the same period of time, initially attributed to thrush but not resolving with treatment with 14 days of fluconazole  - MRI brain negative for acute intracranial abnormalities - SLP following for dysphagia and dysarthria/cognition - recommended dysphagia-2 with thin liquids, but concern for some underlying neurogenic component.  - PT/OT following - Neuro consulted - indicated clinical presentation most consistent with MG with concern for myasthenic crisis. Was noted to have significant improvement with dysarthria after administration of IV pyridostigmine  2 mg. Continue with neuro recommendations as follows: - IVIG 400mg /Kg every 24 hours x 5 doses, started 11/22 - Every 6 hours NIFs and VC. Can be spaced out if stable. - AChR and MusK Ab pending - CT chest with contrast negative for thymoma - Mestinon  PO 30mg  TID - Had some tachypnea and tachycardia halfway through IVIG infusion on 11/22, remained normotensive with appropriate SpO2. NIF and VC by RT appropriate. Mild crackles were heard at the RLL (LLL with known rales since admission given underlying ILD). CXR negative for pulmonary edema or pleural effusions. Gave IV Lasix  20 mg as a precaution. - Premedicated with Tylenol  650 mg and Benadryl  50 mg prior to today's IVIG infusion. PRN IV Lasix  20 mg x 1 ordered for shortness of breath during  infusion.  # Orthostatic hypotension -  Patient noted to have positive orthostatics with SBP dropping from 131 to 92 on sitting to standing with worsening dizziness and inability to maintain static stance due to symptoms - Could explain patient's dizziness and perceived ataxia - Will hold  amlodipine  5 mg and losartan  100 mg and tolerate  - Repeat orthostatics with therapy  # Possible hospital delirium - Patient appeared somewhat confused this morning, stating he can't recall much of yesterday's events. 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  Was started on IV Rocephin  and azithromycin  and admitted for CAP - However, patient is afebrile with no leukocytosis and no cough.  Clinically, very low suspicion for pneumonia. - Procalcitonin <0.1, further supporting unlikely diagnosis of bacterial pneumonia - Of note, the patient recently underwent a PET scan on 08/20/2024 which noted patchy ground glass densities in both lungs suggestive of edema or inflammation.  Outpatient pulmonology ordered a high-resolution CT scan for further characterization of the pulmonary findings on PET scan, which was scheduled on the same day of his admission - Discontinued IV Rocephin  and azithromycin  - CT chest (11/20) showed subpleural fibrosis with traction bronchiectasis and superimposed left lower lobe ground glass/consolidation concerning for an underlying interstitial lung disease, most consistent with UIP - Will defer further advanced imaging/workup to outpatient pulmonology   # Ground-level falls # Right parietal scalp laceration and hematoma - Presented after GLF in the bathroom, reportedly due to loss of balance, with trauma to head - CT head showed right posterior convexity scalp hematoma and laceration with confluent soft tissue thickening and mild soft tissue gas, underlying calvarium intact with no skull fracture. - CT cervical spine showed no acute traumatic injury, noted hyperostosis and  ankylosis at C6-C7 with advanced upper cervical facet arthropathy - Underwent laceration repair in the ED  # History prostate cancer # Unintentional weight loss # Severe protein calorie malnutrition - Per review of records, the patient has lost ~ 26 lbs when comparing recorded weights on record from 8/6 and 11/12 - This is somewhat concerning in the setting of history prostate cancer - PET scan 11/12 showed intense intraprostatic activity favored to represent radioactive urine within the prostatic urethra, no evidence of metastatic adenopathy in the abdomen and pelvis, no distant metastatic prostate cancer noted. - RD following - recommended addition of appetite stimulant, Ensure Plus high-protein, multivitamin, thiamine  supplementation, assist with feeding - Continue megace  160 mg daily - Continue thiamine  100 mg for 5 days and multivitamin  # Asthma/COPD overlap syndrome - Follows closely with Dr. Meade in outpatient pulmonology - Continue home Advair  HFA with formulary alternative Breo Ellipta  - PRN DuoNebs  # Chronic rhinitis - Continue home Singulair  10 mg at bedtime  # Hypertension - Continue home amlodipine  5 mg daily and losartan  100 mg daily  #MDD - Continue home Wellbutrin  300 mg daily  #HLD - Home Lipitor held given myasthenic crisis  # GERD - Continue PPI formulary alternative   DVT prophylaxis: enoxaparin  (LOVENOX ) injection 40 mg Start: 08/29/24 1400   Code Status:   Code Status: Full Code  Family Communication: Discussed with patient's wife, [name], over the phone  Disposition Plan: SNF for rehab pending completion of workup  PT - Follow Up Recommendations: Skilled nursing-short term rehab (<3 hours/day) OT - Follow Up Recommendations: Skilled nursing-short term rehab (<3 hours/day)  DME Needs: PT equipment: BSC/3in1    Level of care: Progressive  Consultants:  None  Procedures:  None  Antimicrobials: Rocephin  and  azithromycin    Subjective: Patient evaluated at bedside today. Appeared a bit confused, states he doesn't remember much of what happened yesterday. He was oriented x 4.   Objective: Vitals:   09/02/24 1425 09/02/24 1428 09/02/24 1444 09/02/24 1504  BP: (!) 145/73 138/78 134/81 (!) 145/83  Pulse: 94 98 97 99  Resp: (!) 22  (!) 22 20  Temp: 97.7 F (36.5 C)  (!) 97.5 F (36.4 C) 97.7 F (36.5 C)  TempSrc: Oral  Oral Oral  SpO2: 94% 97% 93% 92%  Weight:      Height:        Intake/Output Summary (Last 24 hours) at 09/02/2024 1511 Last data filed at 09/02/2024 1000 Gross per 24 hour  Intake 792.02 ml  Output 2350 ml  Net -1557.98 ml   Filed Weights   08/29/24 1524  Weight: 70 kg    Examination:  Gen: NAD, A&Ox3 HEENT: NCAT, EOMI Neck: Supple, no JVD, no LAD CV: RRR, no murmurs Resp: normal WOB, rales noted at left lung base, other lung field clear to ausculation. Abd: Soft, NTND, no guarding, BS normoactive Ext: No LE edema, pulses 2+ b/l Skin: Warm, dry, no rashes/lesions Neuro: dysarthric, 4/5 BLE strength, 5/5 BUE strength, gait testing deferred Psych: Calm, cooperative, appropriate affect   Data Reviewed: I have personally reviewed following labs and imaging studies  CBC: Recent Labs  Lab 08/29/24 0740 08/30/24 1151 08/31/24 0520 09/01/24 0849 09/02/24 0329  WBC 7.9 10.8* 9.6 10.0 8.2  HGB 14.6 12.8* 13.3 12.6* 11.8*  HCT 42.6 38.2* 39.8 37.8* 35.0*  MCV 96.2 96.7 96.4 97.2 96.2  PLT 242 241 213 217 202   Basic Metabolic Panel: Recent Labs  Lab 08/29/24 0740 08/30/24 1151 08/31/24 0520 09/01/24 0849 09/02/24 0329  NA 141 137 138 139 136  K 3.7 4.0 3.9 3.7 3.6  CL 103 102 103 107 103  CO2 28 28 27 25 25   GLUCOSE 109* 117* 95 76 87  BUN 9 12 11 9 14   CREATININE 0.46* 0.59* 0.54* 0.39* 0.46*  CALCIUM  9.3 8.7* 8.6* 8.5* 8.2*   GFR: Estimated Creatinine Clearance: 69.3 mL/min (A) (by C-G formula based on SCr of 0.46 mg/dL (L)). Liver  Function Tests: Recent Labs  Lab 08/29/24 0740  AST 28  ALT 29  ALKPHOS 89  BILITOT 0.9  PROT 6.6  ALBUMIN 2.9*   No results for input(s): LIPASE, AMYLASE in the last 168 hours. No results for input(s): AMMONIA in the last 168 hours. Coagulation Profile: No results for input(s): INR, PROTIME in the last 168 hours. Cardiac Enzymes: No results for input(s): CKTOTAL, CKMB, CKMBINDEX, TROPONINI in the last 168 hours. BNP (last 3 results) No results for input(s): PROBNP in the last 8760 hours. HbA1C: No results for input(s): HGBA1C in the last 72 hours. CBG: Recent Labs  Lab 08/29/24 0737  GLUCAP 109*   Lipid Profile: No results for input(s): CHOL, HDL, LDLCALC, TRIG, CHOLHDL, LDLDIRECT in the last 72 hours. Thyroid  Function Tests: No results for input(s): TSH, T4TOTAL, FREET4, T3FREE, THYROIDAB in the last 72 hours. Anemia Panel: Recent Labs    08/31/24 0520 08/31/24 1452  VITAMINB12  --  891  FOLATE 16.1  --  Sepsis Labs: Recent Labs  Lab 08/30/24 1151  PROCALCITON <0.10    Recent Results (from the past 240 hours)  Resp panel by RT-PCR (RSV, Flu A&B, Covid) Anterior Nasal Swab     Status: None   Collection Time: 08/29/24  1:13 PM   Specimen: Anterior Nasal Swab  Result Value Ref Range Status   SARS Coronavirus 2 by RT PCR NEGATIVE NEGATIVE Final   Influenza A by PCR NEGATIVE NEGATIVE Final   Influenza B by PCR NEGATIVE NEGATIVE Final    Comment: (NOTE) The Xpert Xpress SARS-CoV-2/FLU/RSV plus assay is intended as an aid in the diagnosis of influenza from Nasopharyngeal swab specimens and should not be used as a sole basis for treatment. Nasal washings and aspirates are unacceptable for Xpert Xpress SARS-CoV-2/FLU/RSV testing.  Fact Sheet for Patients: bloggercourse.com  Fact Sheet for Healthcare Providers: seriousbroker.it  This test is not yet approved or  cleared by the [place]  FDA and has been authorized for detection and/or diagnosis of SARS-CoV-2 by FDA under an Emergency Use Authorization (EUA). This EUA will remain in effect (meaning this test can be used) for the duration of the COVID-19 declaration under Section 564(b)(1) of the Act, 21 U.S.C. section 360bbb-3(b)(1), unless the authorization is terminated or revoked.     Resp Syncytial Virus by PCR NEGATIVE NEGATIVE Final    Comment: (NOTE) Fact Sheet for Patients: bloggercourse.com  Fact Sheet for Healthcare Providers: seriousbroker.it  This test is not yet approved or cleared by the [place]  FDA and has been authorized for detection and/or diagnosis of SARS-CoV-2 by FDA under an Emergency Use Authorization (EUA). This EUA will remain in effect (meaning this test can be used) for the duration of the COVID-19 declaration under Section 564(b)(1) of the Act, 21 U.S.C. section 360bbb-3(b)(1), unless the authorization is terminated or revoked.  Performed at [facility], [address], [place], [place] [address]      Radiology Studies: CT CHEST W CONTRAST Result Date: 09/01/2024 CLINICAL DATA:  Clinical history of prostate cancer. 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 Evaluate for thymoma. EXAM: CT CHEST WITH CONTRAST TECHNIQUE: Multidetector CT imaging of the chest was performed during intravenous contrast administration. RADIATION DOSE REDUCTION: This exam was performed according to the departmental dose-optimization program which includes automated exposure control, adjustment of the mA and/or kV according to patient size and/or use of iterative reconstruction technique. CONTRAST:  75mL OMNIPAQUE  IOHEXOL  350 MG/ML SOLN COMPARISON:  Chest CT 08/30/2024 FINDINGS: Cardiovascular: The heart is enlarged but appears stable. No pericardial effusion. Stable tortuosity, ectasia and calcification of the thoracic aorta. 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 Moderate atherosclerotic calcifications. There are extensive three-vessel coronary artery calcifications and calcifications around the aortic valve. Mediastinum/Nodes: Small scattered mediastinal and hilar lymph nodes but no mass or overt adenopathy. The esophagus is grossly normal. The thyroid  gland is unremarkable. No anterior mediastinal mass to suggest a thymoma or other thymic lesion. 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 No pleural effusions or pulmonary edema. Stable scattered nodularity likely related to the underlying lung disease. No discrete worrisome pulmonary lesions. Upper Abdomen: No significant upper abdominal findings. Musculoskeletal: No significant bony findings. IMPRESSION: 1. No anterior mediastinal mass to suggest a thymoma or other thymic lesion. 2. 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 3. Stable severe fibrotic lung disease. 4. Stable eventration of the right  hemidiaphragm with overlying vascular crowding and atelectasis. 5. Stable scattered nodularity likely related to the underlying lung disease. No discrete worrisome pulmonary lesions. 6. Stable cardiac enlargement and extensive three-vessel coronary artery calcifications. 7. Aortic atherosclerosis. Aortic Atherosclerosis (ICD10-I70.0). Electronically Signed   By: MYRTIS Stammer M.D.   On: 09/01/2024 22:59   DG CHEST PORT 1 VIEW Result Date: 09/01/2024 EXAM: 1 VIEW(S) XRAY OF THE CHEST 09/01/2024 05:00:00 PM COMPARISON: None available. CLINICAL HISTORY: Tachypnea. FINDINGS: Inspiration. LUNGS AND PLEURA: Scarring and pleural thickening in the right lung base. Infiltration or atelectasis in the left lung base. No pleural effusion. No pneumothorax. HEART AND MEDIASTINUM: Heart size and  pulmonary vascularity are normal. Calcification of the aorta. BONES AND SOFT TISSUES: Postoperative changes in both shoulders. Degenerative changes in the spine. IMPRESSION: 1. Infiltration or atelectasis in the left lung base. 2. Scarring and pleural thickening in the right lung base. Electronically signed by: Elsie Gravely MD 09/01/2024 05:08 PM EST RP Workstation: [id number]    Scheduled Meds:  buPROPion   300 mg Oral Daily   enoxaparin  (LOVENOX ) injection  40 mg Subcutaneous Q24H   feeding supplement  237 mL Oral BID BM   fluticasone  furoate-vilanterol  1 puff Inhalation Daily   megestrol   160 mg Oral Daily   montelukast   10 mg Oral QHS   multivitamin with minerals  1 tablet Oral Daily   pantoprazole   80 mg Oral Daily   pyridostigmine   30 mg Oral Q8H   Or   pyridostigmine   1 mg Intravenous Q8H   thiamine   100 mg Oral Daily   Continuous Infusions:  Immune Globulin  10% 168 mL/hr at 09/02/24 1444     LOS:  LOS: 4 days   Time Spent: 60 minutes  Unresulted Labs (From admission, onward)     Start     Ordered   09/05/24 0500  Creatinine, serum  (enoxaparin  (LOVENOX )    CrCl >/= 30 ml/min)  Weekly,   R     Comments: while on enoxaparin  therapy    08/29/24 1339   09/01/24 0645  MuSK Antibodies  Once,   R        09/01/24 0644   09/01/24 0644  Acetylcholine receptor, binding  Once,   R        09/01/24 0644   08/31/24 1739  Vitamin B1  Add-on,   AD        08/31/24 1738   08/30/24 1106  CBC  Daily,   R      08/30/24 1105   08/30/24 1106  Basic metabolic panel with GFR  Daily,   R      08/30/24 1105             Levonne Carreras Al-Sultani, MD [facility]  If 7PM-7AM, please contact night-coverage  09/02/2024, 3:11 PM

## 2024-09-02 NOTE — Progress Notes (Signed)
 NIF: >-40 VC: 1.1L with good pt effort

## 2024-09-02 NOTE — Progress Notes (Signed)
 IVIG 2nd day dose was started at 1339, pt tolerated well with each increase, VS remained stable. (Pt was premedicated with tylenol  and benadryl  as per MD ordered prior to infusion) Pt well into the infusion and tolerating well, remained with pt as his bedside until 1530. Handoff report given to pt's primary nurse, Aureliano. Infusion should be complete in next 30-45 minutes approx. Pt's wife at bedside.   Madelin Barefoot, RN SWOT

## 2024-09-02 NOTE — Progress Notes (Signed)
 There was a consult for administration of IVIG. Guided patient's RN how to calculate the dose and provided infusion flowsheet. SWAT nurse will help infusion of IVIG for the patient's RN Aureliano. Informed Aureliano RN that if SWAT nurse has any questions, send secure chat. HS [employer]

## 2024-09-03 DIAGNOSIS — R634 Abnormal weight loss: Secondary | ICD-10-CM | POA: Insufficient documentation

## 2024-09-03 DIAGNOSIS — G7001 Myasthenia gravis with (acute) exacerbation: Secondary | ICD-10-CM | POA: Diagnosis present

## 2024-09-03 DIAGNOSIS — W1830XA Fall on same level, unspecified, initial encounter: Secondary | ICD-10-CM | POA: Insufficient documentation

## 2024-09-03 DIAGNOSIS — J849 Interstitial pulmonary disease, unspecified: Secondary | ICD-10-CM | POA: Insufficient documentation

## 2024-09-03 DIAGNOSIS — S0101XA Laceration without foreign body of scalp, initial encounter: Secondary | ICD-10-CM | POA: Insufficient documentation

## 2024-09-03 DIAGNOSIS — G7000 Myasthenia gravis without (acute) exacerbation: Secondary | ICD-10-CM | POA: Diagnosis not present

## 2024-09-03 DIAGNOSIS — I951 Orthostatic hypotension: Secondary | ICD-10-CM | POA: Insufficient documentation

## 2024-09-03 DIAGNOSIS — R42 Dizziness and giddiness: Secondary | ICD-10-CM | POA: Diagnosis not present

## 2024-09-03 DIAGNOSIS — R471 Dysarthria and anarthria: Secondary | ICD-10-CM | POA: Diagnosis not present

## 2024-09-03 LAB — CBC
HCT: 36.7 % — ABNORMAL LOW (ref 39.0–52.0)
Hemoglobin: 12.5 g/dL — ABNORMAL LOW (ref 13.0–17.0)
MCH: 32.7 pg (ref 26.0–34.0)
MCHC: 34.1 g/dL (ref 30.0–36.0)
MCV: 96.1 fL (ref 80.0–100.0)
Platelets: 203 K/uL (ref 150–400)
RBC: 3.82 MIL/uL — ABNORMAL LOW (ref 4.22–5.81)
RDW: 14.1 % (ref 11.5–15.5)
WBC: 6.9 K/uL (ref 4.0–10.5)
nRBC: 0 % (ref 0.0–0.2)

## 2024-09-03 LAB — BASIC METABOLIC PANEL WITH GFR
Anion gap: 9 (ref 5–15)
BUN: 15 mg/dL (ref 8–23)
CO2: 26 mmol/L (ref 22–32)
Calcium: 8.9 mg/dL (ref 8.9–10.3)
Chloride: 105 mmol/L (ref 98–111)
Creatinine, Ser: 0.61 mg/dL (ref 0.61–1.24)
GFR, Estimated: >60 mL/min (ref >60)
Glucose, Bld: 117 mg/dL — ABNORMAL HIGH (ref 70–99)
Potassium: 3.7 mmol/L (ref 3.5–5.1)
Sodium: 140 mmol/L (ref 135–145)

## 2024-09-03 LAB — ACETYLCHOLINE RECEPTOR, BINDING: Acety choline binding ab: <0.07 nmol/L (ref 0.00–0.24)

## 2024-09-03 MED ORDER — DIPHENHYDRAMINE HCL 25 MG PO CAPS
50.0000 mg | ORAL_CAPSULE | Freq: Once | ORAL | Status: AC
  Administered 2024-09-03: 50 mg via ORAL
  Filled 2024-09-03: qty 2

## 2024-09-03 MED ORDER — ACETAMINOPHEN 325 MG PO TABS
650.0000 mg | ORAL_TABLET | Freq: Once | ORAL | Status: AC
  Administered 2024-09-03: 650 mg via ORAL
  Filled 2024-09-03: qty 2

## 2024-09-03 NOTE — TOC Progression Note (Addendum)
 Transition of Care [facility]) - Progression Note    Patient Details  Name: Austin [name] MRN: [id number] Date of Birth: 10-05-1941  [facility] of [facility]) CM/SW Contact  Isaiah Public, LCSWA Phone Number: 09/03/2024, 2:13 PM  Clinical Narrative:     CSW spoke with patient at bedside and provided medicare compare ratings list with patients accepted SNF bed offers. Patient request for CSW to follow up with his spouse Austin [name] to help with his SNF choice. CSW called Austin [name] and LVM. CSW awaiting call back to discuss patients SNF bed offers. CSW will continue to follow.  Update- CSW spoke with patients spouse [place]. She is going to review SNF bed offers with her son this evening. CSW will follow back up on patients SNF choice tomorrow.  Expected Discharge Plan: Skilled Nursing Facility Barriers to Discharge: SNF Pending bed offer, Continued Medical Work up               Expected Discharge Plan and Services In-house Referral: Clinical Social Work     Living arrangements for the past 2 months: Single Family Home                                       Social Drivers of Health (SDOH) Interventions SDOH Screenings   Food Insecurity: No Food Insecurity (08/29/2024)  Housing: Low Risk  (08/29/2024)  Transportation Needs: No Transportation Needs (08/29/2024)  Utilities: Not At Risk (08/29/2024)  Depression (PHQ2-9): High Risk (07/09/2024)  Financial Resource Strain: Low Risk  (09/27/2023)  Physical Activity: Insufficiently Active (09/27/2023)  Social Connections: Moderately Isolated (08/29/2024)  Stress: No Stress Concern Present (09/27/2023)  Tobacco Use: Medium Risk (08/31/2024)  Health Literacy: Adequate Health Literacy (09/27/2023)    Readmission Risk Interventions     No data to display

## 2024-09-03 NOTE — Progress Notes (Signed)
 NEUROLOGY CONSULT FOLLOW UP NOTE   Date of service: September 03, 2024 Patient Name: Austin [name] MRN:  [id number] DOB:  31-Jul-1941  Interval Hx/subjective   Sitting up in bed. Dysarthria improved compared to yesterday. NIF -40 overnight  Vitals   Vitals:   09/02/24 2030 09/02/24 2348 09/03/24 0441 09/03/24 0732  BP: 119/72 124/81 (!) 144/87 (!) 144/96  Pulse: (!) 101  (!) 111 (!) 113  Resp: 20 18 19  (!) 24  Temp: 98.4 F (36.9 C) 97.6 F (36.4 C) 97.6 F (36.4 C) 97.7 F (36.5 C)  TempSrc: Axillary Oral Oral Oral  SpO2: 93% 92% 91% 93%  Weight:      Height:         Body mass index is 22.79 kg/m.  Physical Exam   Constitutional: Appears acutely ill and weak.    Neurologic Examination   Neuro: Mental Status: Patient is awake, alert, oriented to person, place, month, year, and situation. Patient is able to give a clear and coherent history. No signs of aphasia or neglect Speech: mild dysarthria, improved Cranial Nerves: II: Visual Fields are full. Pupils are equal, round, and reactive to light.   III,IV, VI: EOMI without ptosis. Reported diplopia with sustained upward gaze. No pain with EOM.  V: Facial sensation is symmetric to light touch VII: Facial movement is symmetric.  VIII: hearing is intact to voice X: Uvula elevates symmetrically.  Voice is hypophonic XI: Shoulder shrug is symmetric. XII: tongue is midline without atrophy or fasciculations.  Motor: Tone is normal. Bulk is reduced.  RUE/RLE: 4+/5 proximal/shoulder, 5/5 distal/wrist RLE/LLE: 4+/5 proximal/hip, 4+/5 knee, 5/5 ankle Neck 4/5 Sensory: Sensation is symmetric to light touch in the arms and legs. Cerebellar: FNF and HKS are slow but intact bilaterally  Medications  Current Facility-Administered Medications:    buPROPion  (WELLBUTRIN  XL) 24 hr tablet 300 mg, 300 mg, Oral, Daily, Georgina Basket, MD, 300 mg at 09/02/24 1032   enoxaparin  (LOVENOX ) injection 40 mg, 40 mg, Subcutaneous, Q24H,  Georgina Basket, MD, 40 mg at 09/02/24 1619   feeding supplement (ENSURE PLUS HIGH PROTEIN) liquid 237 mL, 237 mL, Oral, BID BM, Al-Sultani, Anmar, MD, 237 mL at 09/02/24 1617   fluticasone  furoate-vilanterol (BREO ELLIPTA ) 100-25 MCG/ACT 1 puff, 1 puff, Inhalation, Daily, Georgina Basket, MD, 1 puff at 09/02/24 9166   furosemide  (LASIX ) injection 20 mg, 20 mg, Intravenous, Once PRN, Al-Sultani, Anmar, MD   Immune Globulin  10% (PRIVIGEN ) IV infusion 30 g, 400 mg/kg, Intravenous, Q24 Hr x 5, Khaliqdina, Salman, MD, Stopped at 09/02/24 1641   ipratropium-albuterol  (DUONEB) 0.5-2.5 (3) MG/3ML nebulizer solution 3 mL, 3 mL, Nebulization, Q6H PRN, Georgina Basket, MD   megestrol  (MEGACE ) tablet 160 mg, 160 mg, Oral, Daily, Al-Sultani, Anmar, MD, 160 mg at 09/02/24 1034   montelukast  (SINGULAIR ) tablet 10 mg, 10 mg, Oral, QHS, Al-Sultani, Anmar, MD, 10 mg at 09/02/24 2202   multivitamin with minerals tablet 1 tablet, 1 tablet, Oral, Daily, Al-Sultani, Anmar, MD, 1 tablet at 09/02/24 1033   pantoprazole  (PROTONIX ) EC tablet 80 mg, 80 mg, Oral, Daily, Al-Sultani, Anmar, MD, 80 mg at 09/02/24 1033   pyridostigmine  (MESTINON ) tablet 30 mg, 30 mg, Oral, Q8H, 30 mg at 09/03/24 0527 **OR** [DISCONTINUED] pyridostigmine  (MESTINON ) injection 1 mg, 1 mg, Intravenous, Q8H, Khaliqdina, Salman, MD, 1 mg at 09/02/24 9380   thiamine  (VITAMIN B1) tablet 100 mg, 100 mg, Oral, Daily, Al-Sultani, Anmar, MD, 100 mg at 09/02/24 1033  Labs and Diagnostic Imaging   CBC:  Recent Labs  Lab 09/02/24 0329 09/03/24 0354  WBC 8.2 6.9  HGB 11.8* 12.5*  HCT 35.0* 36.7*  MCV 96.2 96.1  PLT 202 203    Basic Metabolic Panel:  Lab Results  Component Value Date   NA 140 09/03/2024   K 3.7 09/03/2024   CO2 26 09/03/2024   GLUCOSE 117 (H) 09/03/2024   BUN 15 09/03/2024   CREATININE 0.61 09/03/2024   CALCIUM  8.9 09/03/2024   GFRNONAA >60 09/03/2024   Lipid Panel:  Lab Results  Component Value Date   LDLCALC 73 07/09/2024    HgbA1c:  Lab Results  Component Value Date   HGBA1C 6.0 07/09/2024   AChR- pending MusK Ab - pending  MRI Brain(Personally reviewed): No acute abnormalities.   CT Chest: 1. No anterior mediastinal mass to suggest a thymoma or other thymic lesion. 2. 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  3. Stable severe fibrotic lung disease. 4. Stable eventration of the right hemidiaphragm with overlying vascular crowding and atelectasis. 5. Stable scattered nodularity likely related to the underlying lung disease. No discrete worrisome pulmonary lesions. 6. Stable cardiac enlargement and extensive three-vessel coronary artery calcifications. 7. Aortic atherosclerosis.  Assessment   Austin [name] is a 83 y.o. male with hx of asthma, depression, HTN, prostrate cancer, urinary incontinence, who initially presented with a ground level fall in the setting of pneumonia, admitted for further workup.    He was noted to have trouble swallowing pills, and upon further discusion with family, had been having trouble swallowing and slurred speech x 2-3 months that is worse towards the end of the day and has been losing weight. Neuro exam with speech trails off and becomes hypophonic. Slurring worsens as he continues to talk. No nasal tone. No objective diplopia but reports diplopia to sustained upgaze. Weakness more prevalent proximal rather than distally in all extremities. 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 He was given 2mg  of IV pyridostigmine  with significant improvement in his dysarthria within 10 mins.  CT Chest negative for thymoma. AChR Ab (-). MuSK Ab pending  Recommendations  - Continue IVIG 400mg /Kg every 24 hours x 5 doses.  Last dose 11/26  - Needs premeds for IVIG - Every 12 hours NIFs and VC. Can be spaced out if stable. - F/u MuSK Ab - SLP - Continue Mestinon  PO 30mg  TID. If  unable to swallow, can switch from PO to IV.(30mg  PO is equivalent to 1mg  IV). - Recommend consideration of elective intubation for respiratory compromise if VC falls below 15 to 20 mL/kg and or NIFs falls below -20cm/H2O. If poor effort and not sure, can always get ABG to assess for CO2 retention. Elective intubation for airway cmopromise if she has difficulty clearing her secretions. Oxygen saturation should not be used to make decision regarding intubation.  - Medications that may worsen or trigger MG exacerbation: Class IA antiarrhythmics, magnesium, flouroquinolones, macrolides, aminoglycosides, penicillamine, curare, interferon alpha, botox, quinine. Use with caution: calcium  channel blocker, beta blockers and statins.  ______________________________________________________________________  Patient seen and examined by NP/APP with MD. MD to update note as needed.   Jorene Last, DNP, FNP-BC [facility] Pager: [phone]  Elida Ross, MD [facility] [phone]  If 7pm- 7am, please page neurology on call as listed in [facility].

## 2024-09-03 NOTE — Progress Notes (Signed)
 Speech Language Pathology Treatment: Dysphagia  Patient Details Name: Austin [name] MRN: [id number] DOB: [birth year] Today's Date: 09/03/2024 Time: 8499-8473 SLP Time Calculation (min) (ACUTE ONLY): 26 min  Assessment / Plan / Recommendation Clinical Impression  Pt feels like his speech is a little worse this afternoon, but said that this morning it seemed to be a little better in comparison to the previous day. SLP provided skilled observation during meal. He does show some increased signs of dysphagia, which is likely to occur when his speech is worse. Provided education to pt and spouse about the effect of fatigue in MG. It sounds like they are already prioritizing intake in the morning when he feels his best. Education was also given about strategies to use as his presentation may fluctuate. Pt did cough a few times across the meal, all after drinking thin liquids. He is not typically coughing after thin liquid intake, so again reinforced the importance of timing, including taking a break during moments in which he is coughing more or not swallowing as well. Will continue to follow closely.    HPI HPI: Mr. Austin [name] is an 83 yo presenting 11/19 after GLF, also found to have multifocal CAP. CT Head showed scalp soft tissue injury and otherwise normal for age appearance of the brain. 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 Swallow eval was ordered due to pt having difficulty swallowing pills, reporting dysphagia for three weeks PTA. He was being treated for thrush by his pulmonologist, and GI referral was being considered if symptoms persisted after treatment. Pt also described dysarthria and weakness but MRI Brain 11/21 with no acute intracranial abnormality. 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 MBS completed 11/21 with poor efficiency but good airway protection.  Neurology consulted that day with concern for MG. Pt started on IVIG.   PMH also includes: asthma, depression, HTN, prostate ca      SLP Plan  Continue with current plan of care          Recommendations  Diet recommendations: Dysphagia 2 (fine chop);Thin liquid Liquids provided via: Cup;Straw Medication Administration: Whole meds with puree Supervision: Patient able to self feed;Intermittent supervision to cue for compensatory strategies Compensations: Slow rate;Small sips/bites;Follow solids with liquid Postural Changes and/or Swallow Maneuvers: Seated upright 90 degrees;Upright 30-60 min after meal                  Oral care BID   Frequent or constant Supervision/Assistance Dysphagia, unspecified (R13.10)     Continue with current plan of care     Leita SAILOR., M.A. CCC-SLP [facility] Office: [phone]  Secure chat preferred   09/03/2024, 3:36 PM

## 2024-09-03 NOTE — Progress Notes (Signed)
 PROGRESS NOTE    Austin [name]  FMW:[id number] DOB: 11/05/1940 DOA: 08/29/2024 PCP: [name], MD    Brief Narrative:  The patient is an 83 year old male with PMHx of asthma/COPD overlap syndrome, chronic rhinitis, recent oral thrush treated with fluconazole , HTN, HLD, depression, prostate cancer who presented to the ED on 08/29/2024 after an episode of dizziness and subsequent fall with trauma to the right side of head leading to a right parietal laceration, without loss of consciousness.  On initial evaluation in the ED, he reportedly denied lightheadedness or dizziness prior to falling, though he later endorsed several weeks of dizziness and unsteadiness while ambulating, requiring the use of a walker, and was later noted to be dizzy and unable to ambulate in the ED.  MRI was deferred at that time.  In the ED, he was afebrile with a temp of 98.6 F, HR 97, RR 19, BP 135/85, SpO2 99% on RA.  CBC was unremarkable with no leukocytosis.  CMP showed albumin of 2.9, lower than longstanding baseline.  UA was negative for UTI findings, showed moderate Hgb in the setting of known prostate cancer.  COVID/flu/RSV negative.  CT head showed scalp soft tissue injury.  CT cervical spine showed no acute traumatic injury.  He was noted to have crackles at the left lower lobe with CXR findings of peripheral left lung nodular opacity suspicious for multifocal pneumonia, for which he was started on Rocephin  and azithromycin  and placed up for admission. Notably, patient has been experiencing dysphagia with solids and dysarthria for approximately 3 weeks now, which were thought to be associated with oral thrust in the setting of Breztri  use, for which he was treated with two consecutive 7-day courses of fluconazole  by his outpatient pulmonologist with discontinuation of Breztri  in favor of lower dose Advair . Further, he has unintentional weight loss of approximately 26 lbs over the last 3 months.   Assessment and  Plan:  # Myasthenia Gravis with c/f myasthenic crisis  # Dysphagia  Dysarthria  Ataxia - Patient presents with multiple falls over last 3-4 weeks with new onset unsteadiness while ambulating necessitating the use of a walker, associated with some dizziness. Additionally, he has noted dysphagia and dysarthria around the same period of time, initially attributed to thrush but not resolving with treatment with 14 days of fluconazole  - MRI brain negative for acute intracranial abnormalities - SLP following for dysphagia and dysarthria/cognition - recommended dysphagia-2 with thin liquids, but concern for some underlying neurogenic component.  - PT/OT following - Neuro consulted - indicated clinical presentation most consistent with MG with concern for myasthenic crisis. Was noted to have significant improvement with dysarthria after administration of IV pyridostigmine  2 mg. - IVIG 400mg /Kg every 24 hours x 5 doses, started 11/22 - Every 6 hours NIFs and VC. Can be spaced out if stable. - AChR negative - MusK Ab pending - CT chest with contrast negative for thymoma - Mestinon  PO 30mg  TID - Tolerated third IVIG infusion without complications, premedicated with Tylenol  650 mg and Benadryl  50 mg (will decreased Benadryl  to 25 mg tomorrow, patient was too drowsy)  # Orthostatic hypotension - Patient noted to have positive orthostatics with SBP dropping from 131 to 92 on sitting to standing with worsening dizziness and inability to maintain static stance due to symptoms - Could explain patient's dizziness and perceived ataxia - Discontinued Amlodipine  and Losartan  on 11/21 - will tolerate slightly higher BPs - BP 120-130s today - Orthostatics with therapy today negative  # Possible  hospital delirium - Patient appeared somewhat confused on 11/23 AM, unable to recall much of the previous days events. 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  Was started on IV Rocephin  and azithromycin  and admitted for CAP - However, patient is afebrile with no leukocytosis and no cough.  Clinically, very low suspicion for pneumonia. - Procalcitonin <0.1, further supporting unlikely diagnosis of bacterial pneumonia - Of note, the patient recently underwent a PET scan on 08/20/2024 which noted patchy ground glass densities in both lungs suggestive of edema or inflammation.  Outpatient pulmonology ordered a high-resolution CT scan for further characterization of the pulmonary findings on PET scan, which was scheduled on the same day of his admission - Discontinued IV Rocephin  and azithromycin  - CT chest (11/20) showed subpleural fibrosis with traction bronchiectasis and superimposed left lower lobe ground glass/consolidation concerning for an underlying interstitial lung disease, most consistent with UIP - Will defer further advanced imaging/workup to outpatient pulmonology   # Ground-level falls # Right parietal scalp laceration and hematoma - Presented after GLF in the bathroom, reportedly due to loss of balance, with trauma to head - CT head showed right posterior convexity scalp hematoma and laceration with confluent soft tissue thickening and mild soft tissue gas, underlying calvarium intact with no skull fracture. - CT cervical spine showed no acute traumatic injury, noted hyperostosis and ankylosis at C6-C7 with advanced upper cervical facet arthropathy - Underwent laceration repair in the ED  # History prostate cancer # Unintentional weight loss # Severe protein calorie malnutrition - Per review of records, the patient has lost ~ 26 lbs when comparing recorded weights on record from 8/6 and 11/12 -  This is somewhat concerning in the setting of history prostate cancer - PET scan 11/12 showed intense intraprostatic activity favored to represent radioactive urine within the prostatic urethra, no evidence of metastatic adenopathy in the abdomen and pelvis, no distant metastatic prostate cancer noted. - RD following - recommended addition of appetite stimulant, Ensure Plus high-protein, multivitamin, thiamine  supplementation, assist with feeding - Continue megace  160 mg daily - Continue thiamine  100 mg for 5 days and multivitamin  # Asthma/COPD overlap syndrome - Follows closely with Dr. Meade in outpatient pulmonology - Continue home Advair  HFA with formulary alternative Breo Ellipta  - PRN DuoNebs  # Chronic rhinitis - Continue home Singulair  10 mg at bedtime  # Hypertension - Continue home amlodipine  5 mg daily and losartan  100 mg daily  #MDD - Continue home Wellbutrin  300 mg daily  #HLD - Home Lipitor held given myasthenic crisis  # GERD - Continue PPI formulary alternative   DVT prophylaxis: enoxaparin  (LOVENOX ) injection 40 mg Start: 08/29/24 1400   Code Status:   Code Status: Full Code  Family Communication: Discussed with patient's wife, [name], over the phone  Disposition Plan: SNF for rehab pending completion of workup  PT - Follow Up Recommendations: Skilled nursing-short term rehab (<3 hours/day) OT - Follow Up Recommendations: Skilled nursing-short term rehab (<3 hours/day)  DME Needs: PT equipment: BSC/3in1    Level of care: Progressive  Consultants:  None  Procedures:  None  Antimicrobials: Rocephin  and azithromycin  - discontinued 11/21   Subjective: Patient  evaluated at bedside today.  Patient doing well.  Has no complaints this morning.  Denies any shortness of breath, chest pain, abdominal pain, nausea, vomiting.  Tolerated yesterday's IVIG infusion well.  Objective: Vitals:   09/02/24 1600 09/02/24 2030 09/02/24 2348 09/03/24 0441  BP: (!)  140/83 119/72 124/81 (!) 144/87  Pulse:  (!) 101  (!) 111  Resp:  20 18 19   Temp:  98.4 F (36.9 C) 97.6 F (36.4 C) 97.6 F (36.4 C)  TempSrc: Oral Axillary Oral Oral  SpO2:  93% 92% 91%  Weight:      Height:        Intake/Output Summary (Last 24 hours) at 09/03/2024 0612 Last data filed at 09/03/2024 0550 Gross per 24 hour  Intake 390 ml  Output 3200 ml  Net -2810 ml   Filed Weights   08/29/24 1524  Weight: 70 kg    Examination:  Gen: NAD, A&Ox3 HEENT: Right parietal scalp laceration repair, EOMI Neck: Supple, no JVD CV: RRR, no murmurs Resp: normal WOB, rales noted at left lung base, faint crackles at RLL Abd: Soft, NTND, no guarding, BS normoactive Ext: No LE edema, pulses 2+ b/l Skin: Warm, dry, no rashes/lesions Neuro: dysarthric, 4/5 BLE strength, 5/5 BUE strength, gait testing deferred Psych: Calm, cooperative, appropriate affect   Data Reviewed: I have personally reviewed following labs and imaging studies  CBC: Recent Labs  Lab 08/30/24 1151 08/31/24 0520 09/01/24 0849 09/02/24 0329 09/03/24 0354  WBC 10.8* 9.6 10.0 8.2 6.9  HGB 12.8* 13.3 12.6* 11.8* 12.5*  HCT 38.2* 39.8 37.8* 35.0* 36.7*  MCV 96.7 96.4 97.2 96.2 96.1  PLT 241 213 217 202 203   Basic Metabolic Panel: Recent Labs  Lab 08/30/24 1151 08/31/24 0520 09/01/24 0849 09/02/24 0329 09/03/24 0354  NA 137 138 139 136 140  K 4.0 3.9 3.7 3.6 3.7  CL 102 103 107 103 105  CO2 28 27 25 25 26   GLUCOSE 117* 95 76 87 117*  BUN 12 11 9 14 15   CREATININE 0.59* 0.54* 0.39* 0.46* 0.61  CALCIUM  8.7* 8.6* 8.5* 8.2* 8.9   GFR: Estimated Creatinine Clearance: 69.3 mL/min (by C-G formula based on SCr of 0.61 mg/dL). Liver Function Tests: Recent Labs  Lab 08/29/24 0740  AST 28  ALT 29  ALKPHOS 89  BILITOT 0.9  PROT 6.6  ALBUMIN 2.9*   No results for input(s): LIPASE, AMYLASE in the last 168 hours. No results for input(s): AMMONIA in the last 168 hours. Coagulation  Profile: No results for input(s): INR, PROTIME in the last 168 hours. Cardiac Enzymes: No results for input(s): CKTOTAL, CKMB, CKMBINDEX, TROPONINI in the last 168 hours. BNP (last 3 results) No results for input(s): PROBNP in the last 8760 hours. HbA1C: No results for input(s): HGBA1C in the last 72 hours. CBG: Recent Labs  Lab 08/29/24 0737  GLUCAP 109*   Lipid Profile: No results for input(s): CHOL, HDL, LDLCALC, TRIG, CHOLHDL, LDLDIRECT in the last 72 hours. Thyroid  Function Tests: No results for input(s): TSH, T4TOTAL, FREET4, T3FREE, THYROIDAB in the last 72 hours. Anemia Panel: Recent Labs    08/31/24 1452  VITAMINB12 891   Sepsis Labs: Recent Labs  Lab 08/30/24 1151  PROCALCITON <0.10    Recent Results (from the past 240 hours)  Resp panel by RT-PCR (RSV, Flu A&B, Covid) Anterior Nasal Swab     Status: None   Collection Time: 08/29/24  1:13 PM   Specimen: Anterior Nasal Swab  Result Value Ref  Range Status   SARS Coronavirus 2 by RT PCR NEGATIVE NEGATIVE Final   Influenza A by PCR NEGATIVE NEGATIVE Final   Influenza B by PCR NEGATIVE NEGATIVE Final    Comment: (NOTE) The Xpert Xpress SARS-CoV-2/FLU/RSV plus assay is intended as an aid in the diagnosis of influenza from Nasopharyngeal swab specimens and should not be used as a sole basis for treatment. Nasal washings and aspirates are unacceptable for Xpert Xpress SARS-CoV-2/FLU/RSV testing.  Fact Sheet for Patients: bloggercourse.com  Fact Sheet for Healthcare Providers: seriousbroker.it  This test is not yet approved or cleared by the [place]  FDA and has been authorized for detection and/or diagnosis of SARS-CoV-2 by FDA under an Emergency Use Authorization (EUA). This EUA will remain in effect (meaning this test can be used) for the duration of the COVID-19 declaration under Section 564(b)(1) of the Act, 21  U.S.C. section 360bbb-3(b)(1), unless the authorization is terminated or revoked.     Resp Syncytial Virus by PCR NEGATIVE NEGATIVE Final    Comment: (NOTE) Fact Sheet for Patients: bloggercourse.com  Fact Sheet for Healthcare Providers: seriousbroker.it  This test is not yet approved or cleared by the [place]  FDA and has been authorized for detection and/or diagnosis of SARS-CoV-2 by FDA under an Emergency Use Authorization (EUA). This EUA will remain in effect (meaning this test can be used) for the duration of the COVID-19 declaration under Section 564(b)(1) of the Act, 21 U.S.C. section 360bbb-3(b)(1), unless the authorization is terminated or revoked.  Performed at [facility], [address], [place], [place] [address]      Radiology Studies: CT CHEST W CONTRAST Result Date: 09/01/2024 CLINICAL DATA:  Clinical history of prostate cancer. 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 Evaluate for thymoma. EXAM: CT CHEST WITH CONTRAST TECHNIQUE: Multidetector CT imaging of the chest was performed during intravenous contrast administration. RADIATION DOSE REDUCTION: This exam was performed according to the departmental dose-optimization program which includes automated exposure control, adjustment of the mA and/or kV according to patient size and/or use of iterative reconstruction technique. CONTRAST:  75mL OMNIPAQUE  IOHEXOL  350 MG/ML SOLN COMPARISON:  Chest CT 08/30/2024 FINDINGS: Cardiovascular: The heart is enlarged but appears stable. No pericardial effusion. Stable tortuosity, ectasia and calcification of the thoracic aorta. 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 Moderate atherosclerotic calcifications. There are extensive three-vessel coronary artery calcifications and calcifications around the aortic valve. Mediastinum/Nodes: Small scattered mediastinal and hilar lymph nodes but no mass or  overt adenopathy. The esophagus is grossly normal. The thyroid  gland is unremarkable. No anterior mediastinal mass to suggest a thymoma or other thymic lesion. 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 No pleural effusions or pulmonary edema. Stable scattered nodularity likely related to the underlying lung disease. No discrete worrisome pulmonary lesions. Upper Abdomen: No significant upper abdominal findings. Musculoskeletal: No significant bony findings. IMPRESSION: 1. No anterior mediastinal mass to suggest a thymoma or other thymic lesion. 2. 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 3. Stable severe fibrotic lung disease. 4. Stable eventration of the right hemidiaphragm with overlying vascular crowding and atelectasis. 5. Stable scattered nodularity likely related to the underlying lung disease. No discrete worrisome pulmonary lesions. 6. Stable cardiac enlargement and extensive three-vessel coronary artery calcifications. 7. Aortic atherosclerosis. Aortic Atherosclerosis (ICD10-I70.0). Electronically Signed   By: MYRTIS Stammer M.D.   On: 09/01/2024 22:59   DG CHEST  PORT 1 VIEW Result Date: 09/01/2024 EXAM: 1 VIEW(S) XRAY OF THE CHEST 09/01/2024 05:00:00 PM COMPARISON: None available. CLINICAL HISTORY: Tachypnea. FINDINGS: Inspiration. LUNGS AND PLEURA: Scarring and pleural thickening in the right lung base. Infiltration or atelectasis in the left lung base. No pleural effusion. No pneumothorax. HEART AND MEDIASTINUM: Heart size and pulmonary vascularity are normal. Calcification of the aorta. BONES AND SOFT TISSUES: Postoperative changes in both shoulders. Degenerative changes in the spine. IMPRESSION: 1. Infiltration or atelectasis in the left lung base. 2. Scarring and pleural thickening in the right lung base. Electronically signed by:  Elsie Gravely MD 09/01/2024 05:08 PM EST RP Workstation: [id number]    Scheduled Meds:  buPROPion   300 mg Oral Daily   enoxaparin  (LOVENOX ) injection  40 mg Subcutaneous Q24H   feeding supplement  237 mL Oral BID BM   fluticasone  furoate-vilanterol  1 puff Inhalation Daily   megestrol   160 mg Oral Daily   montelukast   10 mg Oral QHS   multivitamin with minerals  1 tablet Oral Daily   pantoprazole   80 mg Oral Daily   pyridostigmine   30 mg Oral Q8H   Or   pyridostigmine   1 mg Intravenous Q8H   thiamine   100 mg Oral Daily   Continuous Infusions:  Immune Globulin  10% Stopped (09/02/24 1641)     LOS:  LOS: 5 days   Time Spent: 35 minutes  Unresulted Labs (From admission, onward)     Start     Ordered   09/05/24 0500  Creatinine, serum  (enoxaparin  (LOVENOX )    CrCl >/= 30 ml/min)  Weekly,   R     Comments: while on enoxaparin  therapy    08/29/24 1339   09/01/24 0645  MuSK Antibodies  Once,   R        09/01/24 0644   09/01/24 0644  Acetylcholine receptor, binding  Once,   R        09/01/24 0644             Koven Belinsky Al-Sultani, MD [facility]  If 7PM-7AM, please contact night-coverage  09/03/2024, 6:12 AM

## 2024-09-03 NOTE — Progress Notes (Signed)
 IVIG 3rd day dose started at 1100, pt tolerated well with each increase, VS have remained stable. (Premeds given as ordered, pt sleepy but arousable, he received 50mg  benadryl  po premed today--spoke with MD, he may consider lower dose tomorrow so not to make pt so sleepy (yesterday the 50mg  benadryl  did not make him sleepy).   Handoff report will be given to pt's primary nurse, Harlene. Pt has approx an hour left of the infusion.  Pt awake, spoke with MD when he rounded, then drifts off back to sleep.  Madelin Barefoot, RN SWOT

## 2024-09-03 NOTE — Progress Notes (Addendum)
 Physical Therapy Treatment Patient Details Name: Austin [name] MRN: [id number] DOB: [birth year] Today's Date: 09/03/2024   History of Present Illness Austin [name] is a 83 y.o. male admitted 08/29/24 after fall at home in the bathroom d/t loss of balance resulting in striking the right side of his head against the bathtub and his right hip on the floor. Pt sustained a 7 cm laceration to right parietal scalp, not actively bleeding. CT of head/C-spine without acute traumatic injury. R hip X-ray unremarkable. Chest x-ray shows left lung nodule opacity suspicious for multifocal pneumonia. Neurology consulted suspect Myasthenia Gravis, pt started on IVIG. PMHx: HTN, depression, cervical disc disease, prostate cancer, chronic back pain, COPD, and urinary incontinence.    PT Comments  Pt received in supine, agreeable to work with SPTA. Pts orthostatic vitals taken per MD request, pt not orthostatic on this date.  Pt needed min A with bed mobility and up to mod A +2 with transfers. Pt able to ambulate in room with +2 min A with RW fatigues quickly. Verbal cues required for sequencing, posture, and RW usage. RN and MD notified SpO2 desat to 86% during exertional activities, nasal canula placed in pt room if needed but VSS WFL on RA at rest. Patient will benefit from continued inpatient follow up therapy, <3 hours/day.    If plan is discharge home, recommend the following: Assistance with feeding;Help with stairs or ramp for entrance;Assist for transportation;A lot of help with walking and/or transfers;Two people to help with bathing/dressing/bathroom   Can travel by private vehicle        Equipment Recommendations  BSC/3in1    Recommendations for Other Services       Precautions / Restrictions Precautions Precautions: Fall Recall of Precautions/Restrictions: Impaired Precaution/Restrictions Comments: Watch BP Restrictions Weight Bearing Restrictions Per Provider Order: No     Mobility  Bed  Mobility Overal bed mobility: Needs Assistance Bed Mobility: Sit to Supine     Supine to sit: Min assist, HOB elevated     General bed mobility comments: Increased time to complete    Transfers Overall transfer level: Needs assistance Equipment used: Rolling walker (2 wheels) Transfers: Sit to/from Stand, Bed to chair/wheelchair/BSC Sit to Stand: Mod assist, Min assist, +2 physical assistance   Step pivot transfers: Mod assist       General transfer comment: min A+2 fo sit to stand to RW, cues for posture and RW proximity.    Ambulation/Gait Ambulation/Gait assistance: Min assist, +2 physical assistance, +2 safety/equipment Gait Distance (Feet): 6 Feet Assistive device: Rolling walker (2 wheels) Gait Pattern/deviations: Step-to pattern, Decreased stride length, Trunk flexed, Narrow base of support Gait velocity: dec     General Gait Details: Pt ambulated from bed to [facility], took seated rest and then ambulated to recliner. Min A +2 for lines and physical assist.   Stairs             Wheelchair Mobility     Tilt Bed    Modified Rankin (Stroke Patients Only)       Balance Overall balance assessment: Needs assistance, History of Falls Sitting-balance support: Bilateral upper extremity supported, Feet supported Sitting balance-Leahy Scale: Fair Sitting balance - Comments: Pt sat EOB, able to reach down to pull socks up and down to ankles performing selfcare.   Standing balance support: Bilateral upper extremity supported, During functional activity, Reliant on assistive device for balance Standing balance-Leahy Scale: Poor Standing balance comment: Dependent on RW  Communication Communication Communication: Impaired Factors Affecting Communication: Reduced clarity of speech;Hearing impaired  Cognition Arousal: Alert Behavior During Therapy: WFL for tasks assessed/performed   PT - Cognitive impairments:  Safety/Judgement                         Following commands: Impaired Following commands impaired: Follows one step commands with increased time    Cueing Cueing Techniques: Verbal cues, Gestural cues  Exercises      General Comments General comments (skin integrity, edema, etc.): Pt not orthostatic today. Able to move from supine, EOB, BSC, to recliner. +2 physical assist with ambulation. SpO2 desat to 86%, MD and RN notified and PTA placed a [place] in room if RN needs it.      Pertinent Vitals/Pain Pain Assessment Pain Assessment: Faces Faces Pain Scale: Hurts a little bit Pain Location: right hip Pain Descriptors / Indicators: Aching Pain Intervention(s): Monitored during session    Home Living                          Prior Function            PT Goals (current goals can now be found in the care plan section) Acute Rehab PT Goals Patient Stated Goal: Get stronger to safely return Home PT Goal Formulation: With patient/family Time For Goal Achievement: 09/13/24 Progress towards PT goals: Progressing toward goals    Frequency    Min 2X/week      PT Plan      Co-evaluation PT/OT/SLP Co-Evaluation/Treatment: Yes Reason for Co-Treatment: For patient/therapist safety;To address functional/ADL transfers PT goals addressed during session: Mobility/safety with mobility;Balance;Proper use of DME OT goals addressed during session: ADL's and self-care;Proper use of Adaptive equipment and DME      AM-PAC PT 6 Clicks Mobility   Outcome Measure  Help needed turning from your back to your side while in a flat bed without using bedrails?: A Lot Help needed moving from lying on your back to sitting on the side of a flat bed without using bedrails?: A Lot Help needed moving to and from a bed to a chair (including a wheelchair)?: A Lot Help needed standing up from a chair using your arms (e.g., wheelchair or bedside chair)?: A Little Help needed to walk  in hospital room?: A Lot Help needed climbing 3-5 steps with a railing? : Total 6 Click Score: 12    End of Session   Activity Tolerance: Patient limited by fatigue Patient left: in chair;with call bell/phone within reach;with chair alarm set;with family/visitor present (Wife in room) Nurse Communication: Mobility status;Other (comment) (Oxygen levels desat, Garnett placed in room if needed.) PT Visit Diagnosis: Difficulty in walking, not elsewhere classified (R26.2);Muscle weakness (generalized) (M62.81);Other abnormalities of gait and mobility (R26.89);Unsteadiness on feet (R26.81)     Time: 8559-8496 PT Time Calculation (min) (ACUTE ONLY): 23 min  Charges:    $Therapeutic Activity: 8-22 mins PT General Charges $$ ACUTE PT VISIT: 1 Visit                     Austin [name], SPTA    Angeliz Settlemyre 09/03/2024, 3:49 PM

## 2024-09-03 NOTE — Progress Notes (Addendum)
 Occupational Therapy Treatment Patient Details Name: Austin [name] MRN: [id number] DOB: 03/09/1941 Today's Date: 09/03/2024   History of present illness Austin [name] is a 83 y.o. male admitted 08/29/24 after fall at home in the bathroom d/t loss of balance resulting in striking the right side of his head against the bathtub and his right hip on the floor. Pt sustained a 7 cm laceration to right parietal scalp, not actively bleeding. CT of head/C-spine without acute traumatic injury. R hip X-ray unremarkable. Chest x-ray shows left lung nodule opacity suspicious for multifocal pneumonia. PMHx: HTN, depression, cervical disc disease, prostate cancer, chronic back pain, COPD, and urinary incontinence.   OT comments  Pt making progress with functional goals. Pt in be upon arrival and alert; agreeable to EOB and OOB activity. Pt sat EOB with min A requiring increased time and effort, was able to scoot hips to EOB. Pt participated in grooming/hygiene, UB and UB bathing, LB dressing tasks. Min A +2 STS from EOB to RW to step to transfer to [facility] with cues to increase upright posture for safety, proper body mechanics and breathing. Pt's O2 SATs dropping to 86-87% on RA, BP seated at EOB 143/78, standing at EOB 138/84 and standing at [facility] 138/77. Pt's wife in to visit with pt. OT will continue to follow acutely to maximize level of function and safety      If plan is discharge home, recommend the following:  A lot of help with walking and/or transfers;A lot of help with bathing/dressing/bathroom;Assist for transportation;Help with stairs or ramp for entrance   Equipment Recommendations  Other (comment) (defer)    Recommendations for Other Services      Precautions / Restrictions Precautions Precautions: Fall Recall of Precautions/Restrictions: Impaired Precaution/Restrictions Comments: Watch BP Restrictions Weight Bearing Restrictions Per Provider Order: No       Mobility Bed Mobility Overal  bed mobility: Needs Assistance Bed Mobility: Sit to Supine     Supine to sit: Min assist, HOB elevated     General bed mobility comments: Increased time to complete    Transfers Overall transfer level: Needs assistance Equipment used: Rolling walker (2 wheels) Transfers: Sit to/from Stand, Bed to chair/wheelchair/BSC Sit to Stand: Mod assist, Min assist, +2 physical assistance     Step pivot transfers: Mod assist     General transfer comment: min A +2 initial STS from EOB to RW, cues for hand placement and upright standing posture     Balance Overall balance assessment: Needs assistance, History of Falls Sitting-balance support: Bilateral upper extremity supported, Feet supported Sitting balance-Leahy Scale: Fair     Standing balance support: Bilateral upper extremity supported, During functional activity, Reliant on assistive device for balance Standing balance-Leahy Scale: Poor                             ADL either performed or assessed with clinical judgement   ADL Overall ADL's : Needs assistance/impaired     Grooming: Wash/dry hands;Wash/dry face;Contact guard assist;Sitting   Upper Body Bathing: Minimal assistance;Sitting   Lower Body Bathing: Moderate assistance;Sitting/lateral leans       Lower Body Dressing: Moderate assistance Lower Body Dressing Details (indicate cue type and reason): donning socks Toilet Transfer: Moderate assistance;Cueing for safety;Cueing for sequencing;Ambulation;BSC/3in1   Toileting- Architect and Hygiene: Moderate assistance;Cueing for safety;Cueing for sequencing;Sit to/from stand       Functional mobility during ADLs: Minimal assistance;Moderate assistance;+2 for physical assistance;Cueing for safety;Cueing for sequencing;Rolling  walker (2 wheels)      Extremity/Trunk Assessment Upper Extremity Assessment Upper Extremity Assessment: Generalized weakness;Right hand dominant;RUE deficits/detail;LUE  deficits/detail RUE Deficits / Details: history of RTC replacement. A/ROM shoulder flexion/abduction to 90 degrees or just under. Able to demonstrate functional IR/er ROM. Wife reports ROM impairs ability to put in hearing aids. MMT: shoulder flexion: 3-/5, abduction 3-/5, IR/er 3+/5 LUE Deficits / Details: history of RTC replacement. A/ROM shoulder flexion/abduction to 90 degrees or just under. Able to demonstrate functional IR/er ROM. Wife reports ROM impairs ability to put in hearing aids. MMT: shoulder flexion: 3-/5, abduction 3-/5, IR/er 3+/5   Lower Extremity Assessment Lower Extremity Assessment: Defer to PT evaluation   Cervical / Trunk Assessment Cervical / Trunk Assessment: Kyphotic;Other exceptions    Vision Baseline Vision/History: 1 Wears glasses Ability to See in Adequate Light: 0 Adequate Patient Visual Report: No change from baseline     Perception     Praxis     Communication Communication Communication: Impaired Factors Affecting Communication: Reduced clarity of speech;Hearing impaired   Cognition Arousal: Alert Behavior During Therapy: WFL for tasks assessed/performed                                 Following commands: Impaired Following commands impaired: Follows one step commands with increased time      Cueing   Cueing Techniques: Verbal cues, Gestural cues  Exercises      Shoulder Instructions       General Comments      Pertinent Vitals/ Pain       Pain Assessment Pain Assessment: Faces Faces Pain Scale: Hurts a little bit Pain Location: right hip Pain Descriptors / Indicators: Aching Pain Intervention(s): Monitored during session, Repositioned  Home Living                                          Prior Functioning/Environment              Frequency  Min 2X/week        Progress Toward Goals  OT Goals(current goals can now be found in the care plan section)  Progress towards OT goals:  Progressing toward goals     Plan      Co-evaluation    PT/OT/SLP Co-Evaluation/Treatment: Yes Reason for Co-Treatment: For patient/therapist safety;To address functional/ADL transfers   OT goals addressed during session: ADL's and self-care;Proper use of Adaptive equipment and DME      AM-PAC OT 6 Clicks Daily Activity     Outcome Measure   Help from another person eating meals?: None Help from another person taking care of personal grooming?: A Little Help from another person toileting, which includes using toliet, bedpan, or urinal?: A Lot Help from another person bathing (including washing, rinsing, drying)?: A Little Help from another person to put on and taking off regular upper body clothing?: A Little Help from another person to put on and taking off regular lower body clothing?: A Lot 6 Click Score: 17    End of Session Equipment Utilized During Treatment: Gait belt;Rolling walker (2 wheels);Other (comment) (BSC)  OT Visit Diagnosis: Unsteadiness on feet (R26.81);Repeated falls (R29.6);Muscle weakness (generalized) (M62.81)   Activity Tolerance Patient tolerated treatment well;Patient limited by fatigue   Patient Left with call bell/phone within reach;with family/visitor present;in chair;Other (comment) (PT staff present)  Nurse Communication Mobility status        Time: 8560-8498 OT Time Calculation (min): 22 min  Charges: OT General Charges $OT Visit: 1 Visit OT Treatments $Self Care/Home Management : 8-22 mins   Jacques Karna Loose 09/03/2024, 3:16 PM

## 2024-09-03 NOTE — Progress Notes (Signed)
 SLP Cancellation Note  Patient Details Name: Austin [name] MRN: [id number] DOB: 15-Aug-1941   Cancelled treatment:       Reason Eval/Treat Not Completed: Patient at procedure or test/unavailable;Fatigue/lethargy limiting ability to participate. Attempted to see pt this morning. He was currently getting set up for his IVIG infusion, and was very lethargic from the Benadryl  they had given him in preparation. Will try to follow up later.     Leita SAILOR., M.A. CCC-SLP Acute Rehabilitation Services Office: [phone]  Secure chat preferred  09/03/2024, 11:02 AM

## 2024-09-04 ENCOUNTER — Inpatient Hospital Stay (HOSPITAL_COMMUNITY)

## 2024-09-04 ENCOUNTER — Telehealth: Payer: Self-pay

## 2024-09-04 DIAGNOSIS — Z789 Other specified health status: Secondary | ICD-10-CM

## 2024-09-04 DIAGNOSIS — R42 Dizziness and giddiness: Secondary | ICD-10-CM | POA: Diagnosis not present

## 2024-09-04 DIAGNOSIS — R638 Other symptoms and signs concerning food and fluid intake: Secondary | ICD-10-CM

## 2024-09-04 DIAGNOSIS — R0609 Other forms of dyspnea: Secondary | ICD-10-CM

## 2024-09-04 DIAGNOSIS — R471 Dysarthria and anarthria: Secondary | ICD-10-CM | POA: Diagnosis not present

## 2024-09-04 DIAGNOSIS — R531 Weakness: Secondary | ICD-10-CM

## 2024-09-04 DIAGNOSIS — S0101XA Laceration without foreign body of scalp, initial encounter: Secondary | ICD-10-CM

## 2024-09-04 DIAGNOSIS — Z515 Encounter for palliative care: Secondary | ICD-10-CM

## 2024-09-04 DIAGNOSIS — Z7189 Other specified counseling: Secondary | ICD-10-CM

## 2024-09-04 DIAGNOSIS — G7000 Myasthenia gravis without (acute) exacerbation: Secondary | ICD-10-CM | POA: Diagnosis not present

## 2024-09-04 LAB — ECHOCARDIOGRAM COMPLETE
AR max vel: 1.1 cm2
AV Area VTI: 1.17 cm2
AV Area mean vel: 1 cm2
AV Mean grad: 19 mmHg
AV Peak grad: 29.4 mmHg
Ao pk vel: 2.71 m/s
Area-P 1/2: 5.46 cm2
Height: 69 in
S' Lateral: 2.85 cm
Weight: 2469.15 [oz_av]

## 2024-09-04 LAB — D-DIMER, QUANTITATIVE: D-Dimer, Quant: 2.16 ug{FEU}/mL — ABNORMAL HIGH (ref 0.00–0.50)

## 2024-09-04 LAB — BRAIN NATRIURETIC PEPTIDE: B Natriuretic Peptide: 28 pg/mL (ref 0.0–100.0)

## 2024-09-04 MED ORDER — FUROSEMIDE 10 MG/ML IJ SOLN
40.0000 mg | Freq: Once | INTRAMUSCULAR | Status: AC
  Administered 2024-09-04: 40 mg via INTRAVENOUS
  Filled 2024-09-04: qty 4

## 2024-09-04 NOTE — TOC Progression Note (Addendum)
 Transition of Care [facility]) - Progression Note    Patient Details  Name: Austin [name] MRN: [id number] Date of Birth: 1941-01-23  [facility] CM/SW Contact  Isaiah Public, LCSWA Phone Number: 09/04/2024, 10:45 AM  Clinical Narrative:     CSW LVM for patients spouse [place] . CSW awaiting call back to discuss patients SNF choice. CSW received call back from patients spouse [place]. Holly request for CSW to call her son [name] to help with SNF choice. Holly provided tele# for [name] [phone]. CSW spoke with [name] who informed CSW that first choice would be [facility] and 2nd choice would be [facility]. Patients son request to speak with palliative or MD to help answer medical questions regarding home with hospice. CSW informed MD.  CSW awaiting to hear back from Tanya with [facility] to confirm SNF bed for patient. CSW will continue to follow.  Update- Tanya confirmed SNF bed for patient at [facility]. CSW will continue to follow.          Expected Discharge Plan and Services In-house Referral: Clinical Social Work     Living arrangements for the past 2 months: Single Family Home                                       Social Drivers of Health (SDOH) Interventions SDOH Screenings   Food Insecurity: No Food Insecurity (08/29/2024)  Housing: Low Risk  (08/29/2024)  Transportation Needs: No Transportation Needs (08/29/2024)  Utilities: Not At Risk (08/29/2024)  Depression (PHQ2-9): High Risk (07/09/2024)  Financial Resource Strain: Low Risk  (09/27/2023)  Physical Activity: Insufficiently Active (09/27/2023)  Social Connections: Moderately Isolated (08/29/2024)  Stress: No Stress Concern Present (09/27/2023)  Tobacco Use: Medium Risk (08/31/2024)  Health Literacy: Adequate Health Literacy (09/27/2023)    Readmission Risk Interventions     No data to display

## 2024-09-04 NOTE — Plan of Care (Signed)
   Problem: Education: Goal: Knowledge of General Education information will improve Description Including pain rating scale, medication(s)/side effects and non-pharmacologic comfort measures Outcome: Progressing   Problem: Health Behavior/Discharge Planning: Goal: Ability to manage health-related needs will improve Outcome: Progressing

## 2024-09-04 NOTE — Telephone Encounter (Signed)
 Copied from CRM #[id number]. Topic: Clinical - Lab/Test Results >> Sep 04, 2024  1:14 PM Corean SAUNDERS wrote: Reason for CRM: Patients wife [name] calling to let Dr. Meade know that the patient has had a lot of testing done during his stay at the hospital if the provider would like to take a look at some of the results.   Pt will need HFU once discharged.

## 2024-09-04 NOTE — Progress Notes (Signed)
 NIF -34 VC 1.2L  With good pt effort

## 2024-09-04 NOTE — Progress Notes (Addendum)
 NIF- -20, -18, -15cmH2o with good effort VC-1.09L, 0.93L with good effort

## 2024-09-04 NOTE — Consult Note (Cosign Needed)
 Consultation Note Date: 09/04/2024   Patient Name: Austin [name]  DOB: 04-Dec-1940  MRN: [id number]  Age / Sex: 83 y.o., male  PCP: [name], MD Referring Physician: Al-Sultani, Anmar, MD  Reason for Consultation: Establishing goals of care and Hospice Evaluation,  Gilmer Coy [phone], asking to discuss hospice  HPI/Patient Profile: 83 y.o. male  with past medical history of  prostate cancer, asthma, HTN, and asthma was admitted on 08/29/2024 after a ground-level fall (recurrent), asthma-COPD overlap syndrome, and severe protein calorie malnutrition secondary to dysphagia.  Neurology was consulted and concern for myasthenia gravis crisis - patient's symptoms have started improving with treatment.  Clinical Assessment and Goals of Care: I have reviewed medical records including: EPIC notes: TOC, respiratory therapy, speech pathology, PT/OT, neurology, hospitalist, nursing Labs: Albumin of 2.9 assessed for overall health, nutritional status, and disease severity, helping predict prognosis and guide care. Available advanced directives in ACP: none  Received report from primary RN -no acute concerns.  Reports he is tolerating IVIG well.  Went to visit patient at bedside -granddaughter/Austin [name] present. Patient was lying in bed awake, alert, oriented, and able to participate in conversation -his speech is mildly slurred but discernible. No signs or non-verbal gestures of pain or discomfort noted. No respiratory distress, increased work of breathing, or secretions noted.  Patient denies pain.  He tells me he feels good.  He explains that his talking is better in the morning and gets worse in the day.  Patient endorses poor appetite.  Denies nausea.  --------------------------------------------------------------------------------------------  Advance Care Planning Conversation   Pertinent diagnoses:  Myasthenia gravis, dysphagia, weakness, decreased oral intake  The patient and/or family consented to a voluntary Advance Care Planning conversation in person and via phone. Individuals present for the conversation:   Patient and granddaughter in person Son/Austin [name] via phone  Discuss diagnosis, prognosis, GOC, EOL wishes, disposition, and options.  Summary of the conversation:   Spoke with patient and granddaughter/Austin [name]. I introduced Palliative Medicine as specialized medical care for people living with serious illness. It focuses on providing relief from the symptoms and stress of a serious illness. The goal is to improve quality of life for both the patient and the family.  We discussed a brief life review of the patient as well as functional and nutritional status.  Prior to hospitalization, patient was living in a private residence with his wife.  We discussed patient's current illness and what it means in the larger context of patient's on-going co-morbidities.  Patient has a mild understanding of his current acute medical situation.  He is not able to verbalize his myasthenia gravis diagnosis.  Reviewed current diagnosis and treatment plan. I attempted to elicit values and goals of care important to the patient. The difference between aggressive medical intervention and comfort care was considered in light of the patient's goals of care.   Therapeutic listening provided as patient reflects on his declining health in the last several months.  Staff entered room to perform echocardiogram -had to end meeting.  Patient is up for PMT visit tomorrow.  He gives consent to call his son/Austin [name].  Called son/Austin [name] -emotional support provided and introduced PMT.  Austin [name]'s most interested in discussing next steps. The difference between aggressive medical intervention and comfort care was considered in light of the patient's goals of care.  Coy indicates that patient has been going through the motions  for a long time.  They do not feel that PT/OT will be productive and patient has indicated that  he is tired.  Family feel they want to save his dignity and bring him home with hospice rather than going to rehab/HH.   Family are familiar with hospice services due to a previous family members utilization (patient's daughter/Austin [name]'s sister).  Education provided on the difference between home vs residential hospice per [facility] request.  Reviewed that private caregiving could be utilized in addition to home hospice services, though usually this is an out-of-pocket cost.  Explained that patient could be transferred to inpatient hospice facility from home when he is deemed eligible.  Patient does have an HCPOA, which is Austin [name]; though patient's paperwork is with a clinical research associate.  Encouraged meeting with Austin [name] and patient to discuss information and final decision making.  Meeting scheduled for tomorrow at 10:30 AM.  Discussed with patient/family the importance of continued conversation with each other and the medical providers regarding overall plan of care and treatment options, ensuring decisions are within the context of the patient's values and GOCs.    Questions and concerns were addressed. The patient/family was encouraged to call with questions and/or concerns. PMT card was provided.   Outcome of the conversation: - Continue current plan of care - Continue full code as previously documented for now - Family meeting scheduled for tomorrow 11/26 at 10:30 AM to discuss rehab versus hospice  I spent 40 minutes providing separately identifiable ACP services with the patient and/or surrogate decision maker in a voluntary, in-person conversation discussing the patient's wishes and goals as detailed in the above note.   Primary Decision Maker: PATIENT with assistance from family    SUMMARY OF RECOMMENDATIONS   Continue current plan of care Continue full code as previously documented for now Family  meeting scheduled for tomorrow 11/26 at 10:30 AM to discuss SNF versus hospice PMT will continue to follow and support holistically   Code Status/Advance Care Planning: Full code  Palliative Prophylaxis:  Aspiration, Delirium Protocol, Frequent Pain Assessment, and Turn Reposition  Additional Recommendations (Limitations, Scope, Preferences): Full Scope Treatment  Psycho-social/Spiritual:  Desire for further Chaplaincy support:no Created space and opportunity for patient and family to express thoughts and feelings regarding patient's current medical situation.  Emotional support and therapeutic listening provided.  Prognosis:  Unable to determine  Discharge Planning: To Be Determined      Primary Diagnoses: Present on Admission:  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 [facility])  Asthma-COPD overlap syndrome (HCC)   I have reviewed the medical record, interviewed the patient and family, and examined the patient. The following aspects are pertinent.  Past Medical History:  Diagnosis Date   Asthma    Depression    Hypertension    Prostate cancer (HCC)    Urinary incontinence    Social History   Socioeconomic History   Marital status: Married    Spouse name: Not on file   Number of children: Not on file   Years of education: Not on file   Highest education level: 12th grade  Occupational History   Occupation: retired  Tobacco Use   Smoking status: Former    Types: Cigars    Quit date: 10/22/2019    Years since quitting: 4.8   Smokeless tobacco: Never   Tobacco comments:    smoked occ. with son. did not tolerate it long. 12/14/19.  Vaping Use   Vaping status: Never Used  Substance and Sexual Activity   Alcohol use: Yes   Drug use: Never   Sexual activity: Not Currently  Other Topics Concern   Not on file  Social History Narrative   Not on file   Social Drivers of Health   Financial Resource Strain: Low Risk  (09/27/2023)   Overall Financial Resource Strain (CARDIA)     Difficulty of Paying Living Expenses: Not hard at all  Food Insecurity: No Food Insecurity (08/29/2024)   Hunger Vital Sign    Worried About Running Out of Food in the Last Year: Never true    Ran Out of Food in the Last Year: Never true  Transportation Needs: No Transportation Needs (08/29/2024)   PRAPARE - Administrator, Civil Service (Medical): No    Lack of Transportation (Non-Medical): No  Physical Activity: Insufficiently Active (09/27/2023)   Exercise Vital Sign    Days of Exercise per Week: 3 days    Minutes of Exercise per Session: 30 min  Stress: No Stress Concern Present (09/27/2023)   [employer] of Occupational Health - Occupational Stress Questionnaire    Feeling of Stress : Only a little  Social Connections: Moderately Isolated (08/29/2024)   Social Connection and Isolation Panel    Frequency of Communication with Friends and Family: More than three times a week    Frequency of Social Gatherings with Friends and Family: More than three times a week    Attends Religious Services: Never    Database Administrator or Organizations: No    Attends Engineer, Structural: Never    Marital Status: Married   Family History  Problem Relation Age of Onset   Diabetes Brother    Heart disease Paternal Grandfather    Asthma Neg Hx    Scheduled Meds:  buPROPion   300 mg Oral Daily   enoxaparin  (LOVENOX ) injection  40 mg Subcutaneous Q24H   feeding supplement  237 mL Oral BID BM   fluticasone  furoate-vilanterol  1 puff Inhalation Daily   megestrol   160 mg Oral Daily   montelukast   10 mg Oral QHS   multivitamin with minerals  1 tablet Oral Daily   pantoprazole   80 mg Oral Daily   pyridostigmine   30 mg Oral Q8H   Continuous Infusions:  Immune Globulin  10% 0 g (09/03/24 1329)   PRN Meds:.ipratropium-albuterol  Medications Prior to Admission:  Prior to Admission medications   Medication Sig Start Date End Date Taking? Authorizing Provider   albuterol  (PROVENTIL ) (2.5 MG/3ML) 0.083% nebulizer solution USE 1 VIAL BY NEBULIZATION EVERY 4 HOURS AS NEEDED FOR WHEEZING OR SHORTNESS OF BREATH/DYSPNEA. G54.001 07/01/23  Yes [name], MD  albuterol  (VENTOLIN  HFA) 108 (90 Base) MCG/ACT inhaler INHALE 1-2 PUFFS BY MOUTH EVERY 6 HOURS AS NEEDED FOR WHEEZE OR SHORTNESS OF BREATH 03/26/24  Yes Meade Verdon RAMAN, MD  amLODipine  (NORVASC ) 5 MG tablet TAKE 1 TABLET (5 MG TOTAL) BY MOUTH DAILY. 10/19/23  Yes [name], MD  atorvastatin  (LIPITOR) 80 MG tablet TAKE 1 TABLET BY MOUTH EVERY DAY 02/02/24  Yes [name], MD  baclofen  (LIORESAL ) 10 MG tablet Take 0.5-1 tablets (5-10 mg total) by mouth 4 (four) times daily as needed for muscle spasms. 12/06/23  Yes [name], MD  buPROPion  (WELLBUTRIN  XL) 300 MG 24 hr tablet Take 1 tablet (300 mg total) by mouth daily. 07/09/24  Yes [name], MD  Cyanocobalamin  (VITAMIN B 12 PO) Take 1,000 Units by mouth.   Yes [provider]  fenofibrate  micronized (LOFIBRA) 200 MG capsule TAKE 1 CAPSULE BY MOUTH EVERY DAY 04/20/24  Yes [name], MD  fluticasone -salmeterol (ADVAIR  HFA) 45-21 MCG/ACT  inhaler Inhale 2 puffs into the lungs 2 (two) times daily. 08/15/24  Yes Meade Verdon RAMAN, MD  gabapentin  (NEURONTIN ) 100 MG capsule TAKE 1 CAPSULE BY MOUTH EVERYDAY AT BEDTIME 04/23/24  Yes [name], MD  ibuprofen  (ADVIL ) 800 MG tablet TAKE 1 TABLET BY MOUTH EVERY 8 HOURS AS NEEDED Patient taking differently: Take 800 mg by mouth every 8 (eight) hours as needed for mild pain (pain score 1-3). 09/09/20  Yes [name], MD  loratadine  (CLARITIN ) 10 MG tablet TAKE 1 TABLET BY MOUTH EVERY DAY 02/02/24  Yes Meade Verdon RAMAN, MD  LORazepam  (ATIVAN ) 0.5 MG tablet 1-2 tabs 30 - 60 min prior to CT. Do not drive with this medicine. Patient taking differently: Take 0.5-1 mg by mouth daily as needed for anxiety (For CT). 08/22/24  Yes Meade Verdon RAMAN, MD  losartan  (COZAAR ) 100 MG tablet TAKE 1  TABLET BY MOUTH EVERY DAY 01/30/24  Yes [name], MD  magnesium oxide (MAG-OX) 400 (240 Mg) MG tablet TAKE 1 TABLET BY MOUTH EVERY DAY 06/01/24  Yes [name], MD  montelukast  (SINGULAIR ) 10 MG tablet TAKE 1 TABLET BY MOUTH EVERYDAY AT BEDTIME 06/01/24  Yes Desai, Nikita S, MD  nitrofurantoin, macrocrystal-monohydrate, (MACROBID) 100 MG capsule Take 100 mg by mouth daily. 09/24/21  Yes [provider]  omeprazole  (PRILOSEC) 20 MG capsule TAKE 2 CAPSULES BY MOUTH EVERY DAY 03/26/24  Yes Desai, Nikita S, MD  solifenacin (VESICARE) 5 MG tablet Take 5 mg by mouth daily.   Yes [provider]  Vitamin D , Cholecalciferol, 50 MCG (2000 UT) CAPS Take 1 capsule by mouth daily.   Yes [provider]  AMBULATORY NON FORMULARY MEDICATION walker Dispense 1 Dx code: F74.447 Use as needed 07/17/24   Corey, Evan S, MD  fluconazole  (DIFLUCAN ) 200 MG tablet Take 1 tablet (200 mg total) by mouth daily. Patient not taking: Reported on 08/30/2024 08/22/24   Meade Verdon RAMAN, MD  glucose blood (FREESTYLE LITE) test strip CHECK BLOOD SUGAR THREE TIMES A DAY E11.9 09/09/21   [name], MD  Lancets Misc. GEORGANA 2 EXTRA) MISC Use to check blood sugar 3 times daily/ Dx E11.9 09/15/20   [name], MD  Spacer/Aero-Holding Raguel DEVI 1 each by Does not apply route as directed. 06/29/24   Meade Verdon RAMAN, MD   No Known Allergies Review of Systems  Constitutional:  Positive for activity change, appetite change and fatigue.  Respiratory:  Negative for shortness of breath.   Gastrointestinal:  Negative for nausea and vomiting.  Neurological:  Positive for weakness.  All other systems reviewed and are negative.   Physical Exam Vitals and nursing note reviewed.  Constitutional:      General: He is not in acute distress. Pulmonary:     Effort: No respiratory distress.  Skin:    General: Skin is warm and dry.  Neurological:     Mental Status: He is alert and oriented to  person, place, and time.  Psychiatric:        Attention and Perception: Attention normal.        Speech: Speech is slurred.        Behavior: Behavior is cooperative.        Cognition and Memory: Cognition and memory normal.     Vital Signs: BP 120/81 (BP Location: Right Arm)   Pulse 98   Temp 97.7 F (36.5 C) (Oral)   Resp (!) 24   Ht 5' 9 (1.753  m)   Wt 70 kg   SpO2 94%   BMI 22.79 kg/m  Pain Scale: 0-10 POSS *See Group Information*: 1-Acceptable,Awake and alert Pain Score: 0-No pain   SpO2: SpO2: 94 % O2 Device:SpO2: 94 % O2 Flow Rate: .   IO: Intake/output summary:  Intake/Output Summary (Last 24 hours) at 09/04/2024 1234 Last data filed at 09/04/2024 0715 Gross per 24 hour  Intake 720 ml  Output 700 ml  Net 20 ml    LBM: Last BM Date : 09/04/24 (1115) Baseline Weight: Weight: 70 kg Most recent weight: Weight: 70 kg     Palliative Assessment/Data: PPS 40%    Cussed case with: Dr. Mosie, primary RN, patient, patient's family  Signed by: Jeoffrey CHRISTELLA Sharps, NP   Please contact Palliative Medicine Team phone at [phone] for questions and concerns.  For individual provider: See Amion  *Portions of this note are a verbal dictation therefore any spelling and/or grammatical errors are due to the Dragon Medical One system interpretation.

## 2024-09-04 NOTE — Progress Notes (Addendum)
 PROGRESS NOTE    Austin [name]  FMW:[id number] DOB: 1941-08-02 DOA: 08/29/2024 PCP: [name], MD    Brief Narrative:  The patient is an 83 year old male with PMHx of asthma/COPD overlap syndrome, chronic rhinitis, recent oral thrush treated with fluconazole , HTN, HLD, depression, prostate cancer who presented to the ED on 08/29/2024 after an episode of dizziness and subsequent fall with trauma to the right side of head leading to a right parietal laceration, without loss of consciousness.  On initial evaluation in the ED, he reportedly denied lightheadedness or dizziness prior to falling, though he later endorsed several weeks of dizziness and unsteadiness while ambulating, requiring the use of a walker, and was later noted to be dizzy and unable to ambulate in the ED.  MRI was deferred at that time.  In the ED, he was afebrile with a temp of 98.6 F, HR 97, RR 19, BP 135/85, SpO2 99% on RA.  CBC was unremarkable with no leukocytosis.  CMP showed albumin of 2.9, lower than longstanding baseline.  UA was negative for UTI findings, showed moderate Hgb in the setting of known prostate cancer.  COVID/flu/RSV negative.  CT head showed scalp soft tissue injury.  CT cervical spine showed no acute traumatic injury.  He was noted to have crackles at the left lower lobe with CXR findings of peripheral left lung nodular opacity suspicious for multifocal pneumonia, for which he was started on Rocephin  and azithromycin  and placed up for admission. Notably, patient has been experiencing dysphagia with solids and dysarthria for approximately 3 weeks now, which were thought to be associated with oral thrust in the setting of Breztri  use, for which he was treated with two consecutive 7-day courses of fluconazole  by his outpatient pulmonologist with discontinuation of Breztri  in favor of lower dose Advair . Further, he has unintentional weight loss of approximately 26 lbs over the last 3 months.   Assessment and  Plan:  # Myasthenia Gravis with c/f myasthenic crisis  # Dysphagia  Dysarthria  Ataxia - Patient presents with multiple falls over last 3-4 weeks with new onset unsteadiness while ambulating necessitating the use of a walker, associated with some dizziness. Additionally, he has noted dysphagia and dysarthria around the same period of time, initially attributed to thrush but not resolving with treatment with 14 days of fluconazole  - MRI brain negative for acute intracranial abnormalities - SLP following for dysphagia and dysarthria/cognition - recommended dysphagia-2 with thin liquids, but concern for some underlying neurogenic component.  - PT/OT following - Neuro consulted - indicated clinical presentation most consistent with MG with concern for myasthenic crisis. Was noted to have significant improvement with dysarthria after administration of IV pyridostigmine  2 mg. - IVIG 400mg /Kg every 24 hours x 5 doses, started 11/22 - q12 hours NIFs and VC - AChR negative - MusK Ab pending - CT chest with contrast negative for thymoma - Mestinon  PO 30mg  TID - Tolerated fourth IVIG infusion without complications, without premedication today  # Orthostatic hypotension - resolved - Patient noted to have positive orthostatics with SBP dropping from 131 to 92 on sitting to standing with worsening dizziness and inability to maintain static stance due to symptoms - Could explain patient's dizziness and perceived ataxia - Discontinued Amlodipine  and Losartan  on 11/21 - will tolerate slightly higher BPs - BP 120-130s today - Orthostatics with therapy negative  # Sinus tachycardia # Intermittent tachypnea # Mod AS # RV dysfunction  - Onset coincided with starting IVIG infusions - HR 98-112, BP stable,  RR 20-24, BP stable - Premedication before IVIG infusion did not make a difference - TTE (11/25) showed LVEF 65-70% with normal LV function and mild concentric LVH, IV septal flattening consistent with RV  pressure/volume overload.  RV moderately enlarged with moderately reduced systolic function.  Moderate AS, mild AR, trivial MR/TR.  No pericardial effusion.  IVC collapsible - IV Lasix  40 mg x 1 ordered  Addendum: - Discussed with [name] with cardiology who indicated RV dysfunction not very significant given collapsible IVC. No significant valvular abnormalities.  - Ordered BNP and d-dimer  # Possible hospital delirium - Patient appeared somewhat confused on 11/23 AM, unable to recall much of the previous days events. 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  Was started on IV Rocephin  and azithromycin  and admitted for CAP - However, patient is afebrile with no leukocytosis and no cough.  Clinically, very low suspicion for pneumonia. - Procalcitonin <0.1, further supporting unlikely diagnosis of bacterial pneumonia - Of note, the patient recently underwent a PET scan on 08/20/2024 which noted patchy ground glass densities in both lungs suggestive of edema or inflammation.  Outpatient pulmonology ordered a high-resolution CT scan for further characterization of the pulmonary findings on PET scan, which was scheduled on the same day of his admission - Discontinued IV Rocephin  and azithromycin  - CT chest (11/20) showed subpleural fibrosis with traction bronchiectasis and superimposed left lower lobe ground glass/consolidation concerning for an underlying interstitial lung disease, most consistent with UIP - Will defer further advanced imaging/workup to outpatient pulmonology   # Ground-level falls # Right parietal scalp laceration and  hematoma - Presented after GLF in the bathroom, reportedly due to loss of balance, with trauma to head - CT head showed right posterior convexity scalp hematoma and laceration with confluent soft tissue thickening and mild soft tissue gas, underlying calvarium intact with no skull fracture. - CT cervical spine showed no acute traumatic injury, noted hyperostosis and ankylosis at C6-C7 with advanced upper cervical facet arthropathy - Underwent laceration repair in the ED  # History prostate cancer # Unintentional weight loss # Severe protein calorie malnutrition - Per review of records, the patient has lost ~ 26 lbs when comparing recorded weights on record from 8/6 and 11/12 - This is somewhat concerning in the setting of history prostate cancer - PET scan 11/12 showed intense intraprostatic activity favored to represent radioactive urine within the prostatic urethra, no evidence of metastatic adenopathy in the abdomen and pelvis, no distant metastatic prostate cancer noted. - RD following - recommended addition of appetite stimulant, Ensure Plus high-protein, multivitamin, thiamine  supplementation, assist with feeding - Continue megace  160 mg daily - Continue thiamine  100 mg for 5 days and multivitamin  # Asthma/COPD overlap syndrome - Follows closely with Dr. Meade in outpatient pulmonology - Continue home Advair  HFA with formulary alternative Breo Ellipta  - PRN DuoNebs  # Chronic rhinitis - Continue home Singulair  10 mg at bedtime  # Hypertension - Continue home amlodipine  5 mg daily and losartan  100 mg daily  # MDD - Continue home Wellbutrin  300 mg daily  #HLD - Home Lipitor held  given myasthenic crisis  # GERD - Continue PPI formulary alternative  # GOC - Patient's family requested to speaking with Palliative Care in hopes of discussing hospice care options  - Family meeting scheduled for 10:30 AM on 11/26 - Palliative care consulted   DVT prophylaxis: Place TED hose  Start: 09/03/24 1137 enoxaparin  (LOVENOX ) injection 40 mg Start: 08/29/24 1400   Code Status:   Code Status: Full Code  Family Communication: None at bedside  Disposition Plan: SNF for rehab versus hospice (family deliberating with palliative care) pending completion of IVIG infusions and okay from neuro  PT - Follow Up Recommendations: Skilled nursing-short term rehab (<3 hours/day) OT - Follow Up Recommendations: Skilled nursing-short term rehab (<3 hours/day)  DME Needs: PT equipment: BSC/3in1    Level of care: Progressive  Consultants:  None  Procedures:  None  Antimicrobials: Rocephin  and azithromycin  - discontinued 11/21   Subjective: Patient evaluated at bedside today.  Doing well this morning. Feels like his speech and swallowing are overall getting better, but he still fatigues later in the day. He feels like the IVIG infusions have worn him out. He denies chest pain, SOB, abdominal pain, fevers, chills.   Objective: Vitals:   09/04/24 1515 09/04/24 1530 09/04/24 1545 09/04/24 1602  BP: 128/78 135/84 123/77 130/85  Pulse:    (!) 108  Resp:   20 (!) 22  Temp:    97.8 F (36.6 C)  TempSrc:    Oral  SpO2:    95%  Weight:      Height:        Intake/Output Summary (Last 24 hours) at 09/04/2024 1845 Last data filed at 09/04/2024 1746 Gross per 24 hour  Intake 480 ml  Output 1850 ml  Net -1370 ml   Filed Weights   08/29/24 1524  Weight: 70 kg    Examination:  Gen: NAD, A&Ox3 HEENT: Right parietal scalp laceration repair, EOMI Neck: Supple, no JVD CV: RRR, no murmurs Resp: normal WOB, rales noted at left lung base, faint crackles at RLL Abd: Soft, NTND, no guarding, BS normoactive Ext: No LE edema, pulses 2+ b/l Skin: Warm, dry, no rashes/lesions Neuro: dysarthric though improving, 4/5 BLE strength, 5/5 BUE strength Psych: Calm, cooperative, appropriate affect   Data Reviewed: I have personally reviewed following labs and imaging  studies  CBC: Recent Labs  Lab 08/30/24 1151 08/31/24 0520 09/01/24 0849 09/02/24 0329 09/03/24 0354  WBC 10.8* 9.6 10.0 8.2 6.9  HGB 12.8* 13.3 12.6* 11.8* 12.5*  HCT 38.2* 39.8 37.8* 35.0* 36.7*  MCV 96.7 96.4 97.2 96.2 96.1  PLT 241 213 217 202 203   Basic Metabolic Panel: Recent Labs  Lab 08/30/24 1151 08/31/24 0520 09/01/24 0849 09/02/24 0329 09/03/24 0354  NA 137 138 139 136 140  K 4.0 3.9 3.7 3.6 3.7  CL 102 103 107 103 105  CO2 28 27 25 25 26   GLUCOSE 117* 95 76 87 117*  BUN 12 11 9 14 15   CREATININE 0.59* 0.54* 0.39* 0.46* 0.61  CALCIUM  8.7* 8.6* 8.5* 8.2* 8.9   GFR: Estimated Creatinine Clearance: 69.3 mL/min (by C-G formula based on SCr of 0.61 mg/dL). Liver Function Tests: Recent Labs  Lab 08/29/24 0740  AST 28  ALT 29  ALKPHOS 89  BILITOT 0.9  PROT 6.6  ALBUMIN 2.9*   No results for input(s): LIPASE, AMYLASE in the last 168 hours. No results for input(s): AMMONIA in the last 168 hours. Coagulation Profile: No results for input(s): INR, PROTIME  in the last 168 hours. Cardiac Enzymes: No results for input(s): CKTOTAL, CKMB, CKMBINDEX, TROPONINI in the last 168 hours. BNP (last 3 results) No results for input(s): PROBNP in the last 8760 hours. HbA1C: No results for input(s): HGBA1C in the last 72 hours. CBG: Recent Labs  Lab 08/29/24 0737  GLUCAP 109*   Lipid Profile: No results for input(s): CHOL, HDL, LDLCALC, TRIG, CHOLHDL, LDLDIRECT in the last 72 hours. Thyroid  Function Tests: No results for input(s): TSH, T4TOTAL, FREET4, T3FREE, THYROIDAB in the last 72 hours. Anemia Panel: No results for input(s): VITAMINB12, FOLATE, FERRITIN, TIBC, IRON, RETICCTPCT in the last 72 hours.  Sepsis Labs: Recent Labs  Lab 08/30/24 1151  PROCALCITON <0.10    Recent Results (from the past 240 hours)  Resp panel by RT-PCR (RSV, Flu A&B, Covid) Anterior Nasal Swab     Status: None    Collection Time: 08/29/24  1:13 PM   Specimen: Anterior Nasal Swab  Result Value Ref Range Status   SARS Coronavirus 2 by RT PCR NEGATIVE NEGATIVE Final   Influenza A by PCR NEGATIVE NEGATIVE Final   Influenza B by PCR NEGATIVE NEGATIVE Final    Comment: (NOTE) The Xpert Xpress SARS-CoV-2/FLU/RSV plus assay is intended as an aid in the diagnosis of influenza from Nasopharyngeal swab specimens and should not be used as a sole basis for treatment. Nasal washings and aspirates are unacceptable for Xpert Xpress SARS-CoV-2/FLU/RSV testing.  Fact Sheet for Patients: bloggercourse.com  Fact Sheet for Healthcare Providers: seriousbroker.it  This test is not yet approved or cleared by the [place]  FDA and has been authorized for detection and/or diagnosis of SARS-CoV-2 by FDA under an Emergency Use Authorization (EUA). This EUA will remain in effect (meaning this test can be used) for the duration of the COVID-19 declaration under Section 564(b)(1) of the Act, 21 U.S.C. section 360bbb-3(b)(1), unless the authorization is terminated or revoked.     Resp Syncytial Virus by PCR NEGATIVE NEGATIVE Final    Comment: (NOTE) Fact Sheet for Patients: bloggercourse.com  Fact Sheet for Healthcare Providers: seriousbroker.it  This test is not yet approved or cleared by the [place]  FDA and has been authorized for detection and/or diagnosis of SARS-CoV-2 by FDA under an Emergency Use Authorization (EUA). This EUA will remain in effect (meaning this test can be used) for the duration of the COVID-19 declaration under Section 564(b)(1) of the Act, 21 U.S.C. section 360bbb-3(b)(1), unless the authorization is terminated or revoked.  Performed at [facility], [address] [place], [place] [address]      Radiology Studies: ECHOCARDIOGRAM COMPLETE Result Date: 09/04/2024     ECHOCARDIOGRAM REPORT   Patient Name:   SOHAM HOLLETT Date of Exam: 09/04/2024 Medical Rec #:  [id number]      Height:       69.0 in Accession #:    [id number]     Weight:       154.3 lb Date of Birth:  03/25/1941      BSA:          1.850 m Patient Age:    83 years       BP:           123/79 mmHg Patient Gender: M              HR:           106 bpm. Exam Location:  Inpatient Procedure: 2D Echo, Cardiac Doppler and Color Doppler (Both Spectral and Color  Flow Doppler were utilized during procedure). Indications:    Dyspnea  History:        Patient has no prior history of Echocardiogram examinations.                 COPD, Signs/Symptoms:Hypotension; Risk Factors:Dyslipidemia.  Sonographer:    Sherlean Dubin Referring Phys: [id number] DUFFY AL-SULTANI IMPRESSIONS  1. Left ventricular ejection fraction, by estimation, is 65 to 70%. The left ventricle has normal function. The left ventricle has no regional wall motion abnormalities. There is mild concentric left ventricular hypertrophy. Indeterminate diastolic filling due to E-A fusion. There is the interventricular septum is flattened in systole and diastole, consistent with right ventricular pressure and volume overload.  2. Right ventricular systolic function is moderately reduced. The right ventricular size is moderately enlarged. Tricuspid regurgitation signal is inadequate for assessing PA pressure.  3. The mitral valve is grossly normal. Trivial mitral valve regurgitation. No evidence of mitral stenosis.  4. The aortic valve was not well visualized. Aortic valve regurgitation is mild. Moderate aortic valve stenosis. Aortic valve area, by VTI measures 1.17 cm. Aortic valve mean gradient measures 19.0 mmHg. Aortic valve Vmax measures 2.71 m/s.  5. The inferior vena cava is normal in size with greater than 50% respiratory variability, suggesting right atrial pressure of 3 mmHg. FINDINGS  Left Ventricle: Left ventricular ejection fraction, by estimation, is  65 to 70%. The left ventricle has normal function. The left ventricle has no regional wall motion abnormalities. The left ventricular internal cavity size was normal in size. There is  mild concentric left ventricular hypertrophy. The interventricular septum is flattened in systole and diastole, consistent with right ventricular pressure and volume overload. Indeterminate diastolic filling due to E-A fusion. Right Ventricle: The right ventricular size is moderately enlarged. No increase in right ventricular wall thickness. Right ventricular systolic function is moderately reduced. Tricuspid regurgitation signal is inadequate for assessing PA pressure. Left Atrium: Left atrial size was normal in size. Right Atrium: Right atrial size was normal in size. Pericardium: There is no evidence of pericardial effusion. Mitral Valve: The mitral valve is grossly normal. Trivial mitral valve regurgitation. No evidence of mitral valve stenosis. Tricuspid Valve: The tricuspid valve is grossly normal. Tricuspid valve regurgitation is trivial. No evidence of tricuspid stenosis. Aortic Valve: The aortic valve was not well visualized. Aortic valve regurgitation is mild. Moderate aortic stenosis is present. Aortic valve mean gradient measures 19.0 mmHg. Aortic valve peak gradient measures 29.4 mmHg. Aortic valve area, by VTI measures 1.17 cm. Pulmonic Valve: The pulmonic valve was grossly normal. Pulmonic valve regurgitation is trivial. No evidence of pulmonic stenosis. Aorta: The aortic root and ascending aorta are structurally normal, with no evidence of dilitation. Venous: The inferior vena cava is normal in size with greater than 50% respiratory variability, suggesting right atrial pressure of 3 mmHg. IAS/Shunts: The atrial septum is grossly normal.  LEFT VENTRICLE PLAX 2D LVIDd:         4.40 cm   Diastology LVIDs:         2.85 cm   LV e' medial:    11.90 cm/s LV PW:         1.10 cm   LV E/e' medial:  10.8 LV IVS:        1.10 cm    LV e' lateral:   10.42 cm/s LVOT diam:     1.90 cm   LV E/e' lateral: 12.4 LV SV:         45 LV  SV Index:   24 LVOT Area:     2.84 cm  RIGHT VENTRICLE             IVC RV S prime:     17.00 cm/s  IVC diam: 1.20 cm TAPSE (M-mode): 1.1 cm LEFT ATRIUM             Index        RIGHT ATRIUM           Index LA diam:        3.00 cm 1.62 cm/m   RA Area:     11.10 cm LA Vol (A2C):   25.7 ml 13.89 ml/m  RA Volume:   22.40 ml  12.11 ml/m LA Vol (A4C):   22.9 ml 12.38 ml/m LA Biplane Vol: 24.1 ml 13.02 ml/m  AORTIC VALVE AV Area (Vmax):    1.10 cm AV Area (Vmean):   1.00 cm AV Area (VTI):     1.17 cm AV Vmax:           271.00 cm/s AV Vmean:          211.000 cm/s AV VTI:            0.385 m AV Peak Grad:      29.4 mmHg AV Mean Grad:      19.0 mmHg LVOT Vmax:         105.00 cm/s LVOT Vmean:        74.500 cm/s LVOT VTI:          0.159 m LVOT/AV VTI ratio: 0.41  AORTA Ao Root diam: 3.20 cm Ao Asc diam:  3.60 cm MITRAL VALVE MV Area (PHT): 5.46 cm     SHUNTS MV Decel Time: 139 msec     Systemic VTI:  0.16 m MV E velocity: 129.00 cm/s  Systemic Diam: 1.90 cm MV A velocity: 49.30 cm/s MV E/A ratio:  2.62 Darryle Decent MD Electronically signed by Darryle Decent MD Signature Date/Time: 09/04/2024/2:34:44 PM    Final     Scheduled Meds:  buPROPion   300 mg Oral Daily   enoxaparin  (LOVENOX ) injection  40 mg Subcutaneous Q24H   feeding supplement  237 mL Oral BID BM   fluticasone  furoate-vilanterol  1 puff Inhalation Daily   megestrol   160 mg Oral Daily   montelukast   10 mg Oral QHS   multivitamin with minerals  1 tablet Oral Daily   pantoprazole   80 mg Oral Daily   pyridostigmine   30 mg Oral Q8H   Continuous Infusions:  Immune Globulin  10% 0 g (09/03/24 1329)     LOS:  LOS: 6 days   Time Spent: 35 minutes  Unresulted Labs (From admission, onward)     Start     Ordered   09/05/24 0500  Creatinine, serum  (enoxaparin  (LOVENOX )    CrCl >/= 30 ml/min)  Weekly,   R (with TIMED occurrences)     Comments: while  on enoxaparin  therapy    08/29/24 1339   09/01/24 0645  MuSK Antibodies  Once,   R        09/01/24 0644             Sherrin Stahle Al-Sultani, MD [facility]  If 7PM-7AM, please contact night-coverage  09/04/2024, 6:45 PM

## 2024-09-04 NOTE — Progress Notes (Signed)
 SLP Cancellation Note  Patient Details Name: Austin [name] MRN: [id number] DOB: 03/04/1941   Cancelled treatment:       Reason Eval/Treat Not Completed: Fatigue/lethargy limiting ability to participate Spoke briefly with patient and he reported that he did well eating breakfast and is still having some coughing with meals. He was tired and didn't want to eat. SLP will plan to check in another date.   Chistina Roston  Graduate SLP Clinican

## 2024-09-05 ENCOUNTER — Inpatient Hospital Stay (HOSPITAL_COMMUNITY)

## 2024-09-05 DIAGNOSIS — R634 Abnormal weight loss: Secondary | ICD-10-CM

## 2024-09-05 DIAGNOSIS — G7001 Myasthenia gravis with (acute) exacerbation: Secondary | ICD-10-CM | POA: Diagnosis not present

## 2024-09-05 DIAGNOSIS — Z66 Do not resuscitate: Secondary | ICD-10-CM

## 2024-09-05 DIAGNOSIS — E43 Unspecified severe protein-calorie malnutrition: Secondary | ICD-10-CM

## 2024-09-05 DIAGNOSIS — R7989 Other specified abnormal findings of blood chemistry: Secondary | ICD-10-CM

## 2024-09-05 LAB — CREATININE, SERUM
Creatinine, Ser: 0.48 mg/dL — ABNORMAL LOW (ref 0.61–1.24)
GFR, Estimated: >60 mL/min (ref >60)

## 2024-09-05 MED ORDER — MIRTAZAPINE 15 MG PO TABS: 15.0000 mg | ORAL_TABLET | Freq: Every day | ORAL | Status: DC

## 2024-09-05 MED ORDER — THIAMINE HCL 100 MG PO TABS: 100.0000 mg | ORAL_TABLET | Freq: Every day | ORAL | Status: DC

## 2024-09-05 MED ORDER — IOHEXOL 350 MG/ML SOLN
75.0000 mL | Freq: Once | INTRAVENOUS | Status: AC | PRN
  Administered 2024-09-05: 75 mL via INTRAVENOUS

## 2024-09-05 MED ORDER — PYRIDOSTIGMINE BROMIDE 60 MG PO TABS: 30.0000 mg | ORAL_TABLET | Freq: Three times a day (TID) | ORAL | Status: DC

## 2024-09-05 MED ORDER — ENSURE PLUS HIGH PROTEIN PO LIQD: 237.0000 mL | Freq: Two times a day (BID) | ORAL | Status: DC

## 2024-09-05 MED ORDER — CENTRUM FRESH/FRUITY ADULT PO CHEW: 1.0000 | CHEWABLE_TABLET | Freq: Every day | ORAL | Status: DC

## 2024-09-05 NOTE — Progress Notes (Signed)
 Speech Language Pathology Treatment: Dysphagia  Patient Details Name: Austin [name] MRN: [id number] DOB: Sep 24, 1941 Today's Date: 09/05/2024 Time: 8644-8591 SLP Time Calculation (min) (ACUTE ONLY): 13 min  Assessment / Plan / Recommendation Clinical Impression  Advance to Dys 3 (mechanical soft) from dys 2 (minced). SLP will continue to follow.   Patient seen by SLP for skilled treatment focused on dysphagia goals. Patient's spouse was in the room as well and participated in discussion regarding his PO intake. Patient continues to report that he gets too tired by lunch time to want to eat much of anything but that breakfast meal continues to go well for him. His primary concern is related to his dysarthria of speech, which, like the swallowing, is best in the morning and declines throughout the day. SLP discussed the dys 2 (minced) solids and patient indicated that he would like to have diet advanced to give him more choices. SLP was in agreement and advanced his diet to dys 3 (mechanical soft). SLP reviewed primary results of MBS and reviewed swallow safety precautions, including recommendation to conserve energy, eat smaller, more frequent meals, and choose food items that require minimal chewing. Patient and spouse in agreement. SLP will continue to follow.   HPI HPI: Austin [name] is an 83 yo presenting 11/19 after GLF, also found to have multifocal CAP. CT Head showed scalp soft tissue injury and otherwise normal for age appearance of the brain. 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 Swallow eval was ordered due to pt having difficulty swallowing pills, reporting dysphagia for three weeks PTA. He was being treated for thrush by his pulmonologist, and GI referral was being considered if symptoms persisted after treatment. Pt also described dysarthria and weakness but MRI Brain 11/21 with no acute intracranial abnormality. 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 MBS completed 11/21 with poor efficiency but good airway protection. Neurology consulted that day with concern for MG. Pt started on IVIG.   PMH also includes: asthma, depression, HTN, prostate ca      SLP Plan  Continue with current plan of care          Recommendations  Diet recommendations: Dysphagia 3 (mechanical soft);Thin liquid Liquids provided via: Cup;Straw Medication Administration: Whole meds with puree Supervision: Patient able to self feed;Intermittent supervision to cue for compensatory strategies Compensations: Slow rate;Small sips/bites;Follow solids with liquid Postural Changes and/or Swallow Maneuvers: Seated upright 90 degrees;Upright 30-60 min after meal                  Oral care BID   Frequent or constant Supervision/Assistance Dysphagia, unspecified (R13.10)     Continue with current plan of care    Norleen IVAR Blase, MA, CCC-SLP Speech Therapy

## 2024-09-05 NOTE — Care Management Important Message (Signed)
 Important Message  Patient Details  Name: Levent Kornegay MRN: [id number] Date of Birth: 1941-03-31   Important Message Given:  Yes - Medicare IM     Vonzell Arrie Sharps 09/05/2024, 1:37 PM

## 2024-09-05 NOTE — Progress Notes (Signed)
 Mobility Specialist Progress Note:    09/05/24 1533  Pain Assessment  Pain Assessment No/denies pain  Pain Score 0  Mobility  Activity Pivoted/transferred from bed to chair  Level of Assistance Minimal assist, patient does 75% or more (+2)  Assistive Device Front wheel walker  Distance Ambulated (ft) 3 ft  Activity Response Tolerated well  Mobility Referral Yes  Mobility visit 1 Mobility  Mobility Specialist Start Time (ACUTE ONLY) 1432  Mobility Specialist Stop Time (ACUTE ONLY) 1444  Mobility Specialist Time Calculation (min) (ACUTE ONLY) 12 min   Pt received in bed requesting to get back into chair. MinA +2 needed for bed mobility and STS. Took a couple steps towards the chair w/o fault. No c/o throughout. Left in chair w/ call bell and personal belongings in reach. All needs met. Chair alarm on.   Thersia Minder Mobility Specialist  Please contact vis Secure Chat or  Rehab Office [phone]

## 2024-09-05 NOTE — Discharge Summary (Addendum)
 Physician Discharge Summary  Austin [name] FMW:[id number] DOB: 09-22-1941 DOA: 08/29/2024  PCP: Austin Worth HERO, MD  Admit date: 08/29/2024 Discharge date: 09/05/2024  Time spent: 40 minutes  Recommendations for Outpatient Follow-up:  Follow outpatient CBC/CMP  Follow with neurology outpatient for myasthenia gravis Caution with new meds, avoid meds that can exacerbate myasthenia Needs cards follow up outpatient for abnormal echo Follow weight loss outpatient Needs follow up with pulm outpatient for concern for interstitial lung disease Follow MUSK ab Follow blood pressure outpatient, BP meds on hold with orthostasis   Follow dilation of ascending thoracic aorta Palliative care to follow outpatient  Staples out after 7-10 days from placement  Discharge Diagnoses:  Principal Problem:   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 [facility]) Active Problems:   Asthma-COPD overlap syndrome (HCC)   Protein-calorie malnutrition, severe   Orthostatic hypotension   Interstitial lung disease (HCC)   Ground-level fall   Laceration of occipital scalp   Unintentional weight loss   Discharge Condition: stable  Diet recommendation: heart healthy   Filed Weights   08/29/24 1524  Weight: 70 kg   History of present illness:   The patient is an 83 year old male with PMHx of asthma/COPD overlap syndrome, chronic rhinitis, recent oral thrush treated with fluconazole , HTN, HLD, depression, prostate cancer who presented to the ED on 08/29/2024 after an episode of dizziness and subsequent fall with trauma to the right side of head leading to Austin [name] right parietal laceration, without loss of consciousness. On initial evaluation in the ED, he reportedly denied lightheadedness or dizziness prior to falling, though he later endorsed several weeks of dizziness and unsteadiness while ambulating, requiring the use of Austin [name] walker, and was later noted to be dizzy and unable to ambulate in the ED. MRI was deferred at that time.  He  was found to be orthostatic and antihypertensives discontinued.    Due to persistent symptoms, an MRI was obtained which was negative.  Neurology was consulted and he was thought to have symptoms consistent with myasthenia gravis.    Now s/p IVIGx5+ mestinon .  Plan for outpatient neurology follow up.    Plan is for short term rehab with palliative care to follow outpatient.  Hospital Course:  Assessment and Plan:  # Myasthenia Gravis with c/f myasthenic crisis  # Dysphagia  Dysarthria  Ataxia - Patient presents with multiple falls over last 3-4 weeks with new onset unsteadiness while ambulating necessitating the use of Austin [name] walker, associated with some dizziness. Additionally, he has noted dysphagia and dysarthria around the same period of time, initially attributed to thrush but not resolving with treatment with 14 days of fluconazole  - MRI brain negative for acute intracranial abnormalities - SLP following for dysphagia and dysarthria/cognition - recommended dysphagia-2 with thin liquids, but concern for some underlying neurogenic component.  - PT/OT following - Neuro consulted - indicated clinical presentation most consistent with MG with concern for myasthenic crisis. Was noted to have significant improvement with dysarthria after administration of IV pyridostigmine  2 mg. - IVIG 400mg /Kg every 24 hours x 5 doses, completed 11/26.   - AChR negative - MusK Ab pending - CT chest with contrast negative for thymoma - Mestinon  PO 30mg  TID - stable for discharge when bed available from neurologic standpoint.  Plan for outpatient follow up.  Avoid meds that can exacerbate MG.     # Orthostatic hypotension - resolved - Patient noted to have positive orthostatics with SBP dropping from 131 to 92 on sitting to standing with worsening dizziness and  inability to maintain static stance due to symptoms - Could explain patient's dizziness and perceived ataxia - Discontinued Amlodipine  and Losartan  on  11/21 - will tolerate slightly higher Bps - follow symptoms outpatient    # Sinus tachycardia # Intermittent tachypnea # Mod AS # RV dysfunction  - Onset coincided with starting IVIG infusions - HR 98-112, BP stable, RR 20-24, BP stable - Premedication before IVIG infusion did not make Ayris Carano difference - TTE (11/25) showed LVEF 65-70% with normal LV function and mild concentric LVH, IV septal flattening consistent with RV pressure/volume overload.  RV moderately enlarged with moderately reduced systolic function.  Moderate AS, mild AR, trivial MR/TR.  No pericardial effusion.  IVC collapsible - case was discussed by previous hospitalist who discussed with [name] from cardiology who indicated RV dysfunction not very significant given collapsible IVC. No significant valvular abnormalities. - should follow abnormal echo findings with cardiology outpatient -  CT PE protocol without PE, LE US  pending.    # Possible hospital delirium - Patient appeared somewhat confused on 11/23 AM, unable to recall much of the previous days events. Suspect an element of delirium and disorientation as he was moved to 6E from 6N  - Has not had any delirium or confusion episodes since - Oriented to person, place, time, and situation - Delirium precautions    # Chronic Lung Disease  Emphysema  Concern for Chronic Interstitial Lung Disease  Abnormal CT Scan - CT 11/26 without PE, but bronchiectasis and consolidation in the LLL (pneumonia or active alveolitis superimposed on chronic interstitial lung disease), severe emphysematous changes with peripheral interstitial changes - afebrile with no leukocytosis and no cough.  Clinically, very low suspicion for pneumonia. - Procalcitonin <0.1, further supporting unlikely diagnosis of bacterial pneumonia - Of note, the patient recently underwent Austin [name] PET scan on 08/20/2024 which noted patchy ground glass densities in both lungs suggestive of edema or inflammation.   Outpatient pulmonology ordered Austin [name] high-resolution CT scan for further characterization of the pulmonary findings on PET scan, which was scheduled on the same day of his admission - CT chest (11/20) showed subpleural fibrosis with traction bronchiectasis and superimposed left lower lobe ground glass/consolidation concerning for an underlying interstitial lung disease, most consistent with UIP - Will defer further advanced imaging/workup to outpatient pulmonology    # Ground-level falls # Right parietal scalp laceration and hematoma - Presented after GLF in the bathroom, reportedly due to loss of balance, with trauma to head - CT head showed right posterior convexity scalp hematoma and laceration with confluent soft tissue thickening and mild soft tissue gas, underlying calvarium intact with no skull fracture. 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  Stable severe fibrotic lung disease.  Stable scattered nodularity related to underlying lung disease.  (See report).  PET 08/16/2024 with intense central prostatic activity favored to represent radioactive urine within prostatic urethra, no evidence of metastatic adenopathy in abdomen/pelvis.  No distant metastatic prostate cancer.   - RD following - recommended addition of appetite stimulant, Ensure Plus high-protein,  multivitamin, thiamine  supplementation, assist with feeding - will start remeron   # Asthma/COPD overlap syndrome Follows closely with  Dr. Meade in outpatient pulmonology Continue home Advair  HFA with formulary alternative Breo Ellipta  PRN DuoNebs   # Chronic rhinitis Continue home Singulair  10 mg at bedtime   # Hypertension Continue home amlodipine  5 mg daily Will hold losartan  at discharge for now   # MDD Continue home Wellbutrin  300 mg daily   #HLD Home Lipitor discontinued given myasthenic crisis   # GERD PPI   # GOC Appreciate palliative care assistance, plan for discharge to short term rehab with outpatient palliative following.  Now DNR/DNI.  Patient/family will make stepwise decisions pending course at rehab.       Procedures: Echo IMPRESSIONS     1. Left ventricular ejection fraction, by estimation, is 65 to 70%. The  left ventricle has normal function. The left ventricle has no regional  wall motion abnormalities. There is mild concentric left ventricular  hypertrophy. Indeterminate diastolic  filling due to E-Eero Dini fusion. There is the interventricular septum is  flattened in systole and diastole, consistent with right ventricular  pressure and volume overload.   2. Right ventricular systolic function is moderately reduced. The right  ventricular size is moderately enlarged. Tricuspid regurgitation signal is  inadequate for assessing PA pressure.   3. The mitral valve is grossly normal. Trivial mitral valve  regurgitation. No evidence of mitral stenosis.   4. The aortic valve was not well visualized. Aortic valve regurgitation  is mild. Moderate aortic valve stenosis. Aortic valve area, by VTI  measures 1.17 cm. Aortic valve mean gradient measures 19.0 mmHg. Aortic  valve Vmax measures 2.71 m/s.   5. The inferior vena cava is normal in size with greater than 50%  respiratory variability, suggesting right atrial pressure of 3 mmHg.   LE US  Results pending   Consultations: neurology  Discharge Exam: Vitals:   09/05/24 1115 09/05/24 1130  BP: 139/87 134/84  Pulse: (!) 103 (!) 103   Resp:    Temp: 97.8 F (36.6 C) 98.9 F (37.2 C)  SpO2: 95% (!) 89%   Discussed plan of care with son No complaints from Austin [name] - notes fatigability   General: No acute distress. Cardiovascular: RRR Lungs: unlaboredy. Neurological: Alert and oriented 3. Moves all extremities 4 with symmetric strength. Cranial nerves II through XII intact. Extremities: No clubbing or cyanosis. No edema.   Discharge Instructions   Discharge Instructions     Ambulatory referral to Neurology   Complete by: As directed    An appointment is requested in approximately: 4 weeks   Call MD for:  difficulty breathing, headache or visual disturbances   Complete by: As directed    Call MD for:  extreme fatigue   Complete by: As directed    Call MD for:  hives   Complete by: As directed    Call MD for:  persistant dizziness or light-headedness   Complete by: As directed    Call MD for:  persistant nausea and vomiting   Complete by: As directed    Call MD for:  redness, tenderness, or signs of infection (pain, swelling, redness, odor or green/yellow discharge around incision site)   Complete by: As directed    Call MD for:  severe uncontrolled pain   Complete by: As directed    Call MD for:  temperature >100.4   Complete by: As directed    Diet - low sodium heart healthy   Complete  by: As directed    Discharge instructions   Complete by: As directed    You were seen after Herbie Lehrmann fall.   You had an extensive workup and were ultimately found to have myasthenia gravis. You were treated with IVIG and started on mestinon .  You'll need to follow with neurology as an outpatient.  Your outpatient doctors should use caution when prescribing new medicines as some medicines can exacerbate myasthenia.  Your ultrasound of your heart was abnormal and warrants outpatient cardiology follow up.  Your weight loss warrants additional follow up outpatient.  We'll start you on Tyreik Delahoussaye medicine called remeron .    Your CT  scan has findings concerning for interstitial lung disease.  Follow these findings with your pulmonologist as an outpatient.   We'll plan to have palliative care follow with you as an outpatient.  Return for new, recurrent, or worsening symptoms.  Please ask your PCP to request records from this hospitalization so they know what was done and what the next steps will be.   Discharge wound care:   Complete by: As directed    Removal of staples outpatient after 7-10 days from placement   Increase activity slowly   Complete by: As directed       Allergies as of 09/05/2024   No Known Allergies      Medication List     STOP taking these medications    amLODipine  5 MG tablet Commonly known as: NORVASC    atorvastatin  80 MG tablet Commonly known as: LIPITOR   fluconazole  200 MG tablet Commonly known as: DIFLUCAN    LORazepam  0.5 MG tablet Commonly known as: ATIVAN    losartan  100 MG tablet Commonly known as: COZAAR        TAKE these medications    Advair  HFA 45-21 MCG/ACT inhaler Generic drug: fluticasone -salmeterol Inhale 2 puffs into the lungs 2 (two) times daily.   albuterol  (2.5 MG/3ML) 0.083% nebulizer solution Commonly known as: PROVENTIL  USE 1 VIAL BY NEBULIZATION EVERY 4 HOURS AS NEEDED FOR WHEEZING OR SHORTNESS OF BREATH/DYSPNEA. J45.998   albuterol  108 (90 Base) MCG/ACT inhaler Commonly known as: VENTOLIN  HFA INHALE 1-2 PUFFS BY MOUTH EVERY 6 HOURS AS NEEDED FOR WHEEZE OR SHORTNESS OF BREATH   AMBULATORY NON FORMULARY MEDICATION walker Dispense 1 Dx code: F74.447 Use as needed   baclofen  10 MG tablet Commonly known as: LIORESAL  Take 0.5-1 tablets (5-10 mg total) by mouth 4 (four) times daily as needed for muscle spasms.   buPROPion  300 MG 24 hr tablet Commonly known as: Wellbutrin  XL Take 1 tablet (300 mg total) by mouth daily.   feeding supplement Liqd Take 237 mLs by mouth 2 (two) times daily between meals. Start taking on: September 06, 2024    fenofibrate  micronized 200 MG capsule Commonly known as: LOFIBRA TAKE 1 CAPSULE BY MOUTH EVERY DAY   FREESTYLE LITE test strip Generic drug: glucose blood CHECK BLOOD SUGAR THREE TIMES Sharunda Salmon DAY E11.9   gabapentin  100 MG capsule Commonly known as: NEURONTIN  TAKE 1 CAPSULE BY MOUTH EVERYDAY AT BEDTIME   ibuprofen  800 MG tablet Commonly known as: ADVIL  TAKE 1 TABLET BY MOUTH EVERY 8 HOURS AS NEEDED What changed: reasons to take this   loratadine  10 MG tablet Commonly known as: CLARITIN  TAKE 1 TABLET BY MOUTH EVERY DAY   magnesium oxide 400 (240 Mg) MG tablet Commonly known as: MAG-OX TAKE 1 TABLET BY MOUTH EVERY DAY   mirtazapine  15 MG tablet Commonly known as: Remeron  Take 1 tablet (15 mg total) by  mouth at bedtime.   montelukast  10 MG tablet Commonly known as: SINGULAIR  TAKE 1 TABLET BY MOUTH EVERYDAY AT BEDTIME   multivitamin chewable tablet Chew 1 tablet by mouth daily.   nitrofurantoin (macrocrystal-monohydrate) 100 MG capsule Commonly known as: MACROBID Take 100 mg by mouth daily.   omeprazole  20 MG capsule Commonly known as: PRILOSEC TAKE 2 CAPSULES BY MOUTH EVERY DAY   pyridostigmine  60 MG tablet Commonly known as: MESTINON  Take 0.5 tablets (30 mg total) by mouth every 8 (eight) hours.   solifenacin 5 MG tablet Commonly known as: VESICARE Take 5 mg by mouth daily.   Spacer/Aero-Holding Raguel French 1 each by Does not apply route as directed.   thiamine  100 MG tablet Commonly known as: VITAMIN B1 Take 1 tablet (100 mg total) by mouth daily.   Unistik 2 Extra Misc Use to check blood sugar 3 times daily/ Dx E11.9   VITAMIN B 12 PO Take 1,000 Units by mouth.   vitamin D3 50 MCG (2000 UT) Caps Take 1 capsule by mouth daily.               Discharge Care Instructions  (From admission, onward)           Start     Ordered   09/05/24 0000  Discharge wound care:       Comments: Removal of staples outpatient after 7-10 days from placement    09/05/24 1516           No Known Allergies  Contact information for after-discharge care     Destination     [facility] [place], [place] .   Service: Skilled Nursing Contact information: [address] [place]  [phone]                      The results of significant diagnostics from this hospitalization (including imaging, microbiology, ancillary and laboratory) are listed below for reference.    Significant Diagnostic Studies: CT Angio Chest Pulmonary Embolism (PE) W or WO Contrast Result Date: 09/05/2024 EXAM: CTA CHEST 09/05/2024 12:38:25 AM TECHNIQUE: CTA of the chest was performed without and with the administration of 75 mL of intravenous iohexol  (OMNIPAQUE ) 350 MG/ML injection. Multiplanar reformatted images are provided for review. MIP images are provided for review. Automated exposure control, iterative reconstruction, and/or weight based adjustment of the mA/kV was utilized to reduce the radiation dose to as low as reasonably achievable. COMPARISON: Comparison with 09/01/2024, inspiration and motion artifact limited examination. CLINICAL HISTORY: Pulmonary embolism (PE) suspected, low to intermediate prob, positive D-dimer. FINDINGS: PULMONARY ARTERIES: Pulmonary arteries are adequately opacified for evaluation. No acute pulmonary embolus. Main pulmonary artery is normal in caliber. MEDIASTINUM: Cardiac enlargement. No pericardial effusions. Calcification in the coronary arteries and aortic valve. Calcification in the aorta. 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 LYMPH NODES: No mediastinal, hilar or axillary lymphadenopathy. 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 Peripheral interstitial changes likely representing fibrosis. Bronchiectasis and consolidation in the left lower lung may represent pneumonia or active alveolitis superimposed on chronic interstitial lung disease. Lung changes  are similar to the prior study. No evidence of pleural effusion or pneumothorax. UPPER ABDOMEN: Surgical absence of the gallbladder. Duodenal lipoma. SOFT TISSUES AND BONES: Postoperative right shoulder arthroplasty. Degenerative changes in the spine. No acute soft tissue abnormality. IMPRESSION: 1. No evidence of pulmonary embolism. 2. Bronchiectasis and consolidation in the left lower lung may represent pneumonia or active alveolitis superimposed on  chronic interstitial lung disease; changes are similar to the prior study. 3. 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 4. 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 Electronically signed by: Elsie Gravely MD 09/05/2024 12:51 AM EST RP Workstation: [id number]   ECHOCARDIOGRAM COMPLETE Result Date: 09/04/2024    ECHOCARDIOGRAM REPORT   Patient Name:   GARION WEMPE Date of Exam: 09/04/2024 Medical Rec #:  [id number]      Height:       69.0 in Accession #:    [id number]     Weight:       154.3 lb Date of Birth:  Jul 11, 1941      BSA:          1.850 m Patient Age:    83 years       BP:           123/79 mmHg Patient Gender: M              HR:           106 bpm. Exam Location:  Inpatient Procedure: 2D Echo, Cardiac Doppler and Color Doppler (Both Spectral and Color            Flow Doppler were utilized during procedure). Indications:    Dyspnea  History:        Patient has no prior history of Echocardiogram examinations.                 COPD, Signs/Symptoms:Hypotension; Risk Factors:Dyslipidemia.  Sonographer:    Sherlean Dubin Referring Phys: [id number] DUFFY AL-SULTANI IMPRESSIONS  1. Left ventricular ejection fraction, by estimation, is 65 to 70%. The left ventricle has normal function. The left ventricle has no regional wall motion abnormalities. There is mild concentric left ventricular hypertrophy. Indeterminate diastolic filling due to E-Kynzli Rease fusion. There is the interventricular septum is  flattened in systole and diastole, consistent with right ventricular pressure and volume overload.  2. Right ventricular systolic function is moderately reduced. The right ventricular size is moderately enlarged. Tricuspid regurgitation signal is inadequate for assessing PA pressure.  3. The mitral valve is grossly normal. Trivial mitral valve regurgitation. No evidence of mitral stenosis.  4. The aortic valve was not well visualized. Aortic valve regurgitation is mild. Moderate aortic valve stenosis. Aortic valve area, by VTI measures 1.17 cm. Aortic valve mean gradient measures 19.0 mmHg. Aortic valve Vmax measures 2.71 m/s.  5. The inferior vena cava is normal in size with greater than 50% respiratory variability, suggesting right atrial pressure of 3 mmHg. FINDINGS  Left Ventricle: Left ventricular ejection fraction, by estimation, is 65 to 70%. The left ventricle has normal function. The left ventricle has no regional wall motion abnormalities. The left ventricular internal cavity size was normal in size. There is  mild concentric left ventricular hypertrophy. The interventricular septum is flattened in systole and diastole, consistent with right ventricular pressure and volume overload. Indeterminate diastolic filling due to E-Reynald Woods fusion. Right Ventricle: The right ventricular size is moderately enlarged. No increase in right ventricular wall thickness. Right ventricular systolic function is moderately reduced. Tricuspid regurgitation signal is inadequate for assessing PA pressure. Left Atrium: Left atrial size was normal in size. Right Atrium: Right atrial size was normal in size. Pericardium: There is no evidence of pericardial effusion. Mitral Valve: The mitral valve is grossly normal. Trivial mitral valve regurgitation. No evidence of mitral valve stenosis. Tricuspid Valve: The tricuspid valve is grossly normal. Tricuspid valve regurgitation is trivial. No evidence of  tricuspid stenosis. Aortic Valve: The  aortic valve was not well visualized. Aortic valve regurgitation is mild. Moderate aortic stenosis is present. Aortic valve mean gradient measures 19.0 mmHg. Aortic valve peak gradient measures 29.4 mmHg. Aortic valve area, by VTI measures 1.17 cm. Pulmonic Valve: The pulmonic valve was grossly normal. Pulmonic valve regurgitation is trivial. No evidence of pulmonic stenosis. Aorta: The aortic root and ascending aorta are structurally normal, with no evidence of dilitation. Venous: The inferior vena cava is normal in size with greater than 50% respiratory variability, suggesting right atrial pressure of 3 mmHg. IAS/Shunts: The atrial septum is grossly normal.  LEFT VENTRICLE PLAX 2D LVIDd:         4.40 cm   Diastology LVIDs:         2.85 cm   LV e' medial:    11.90 cm/s LV PW:         1.10 cm   LV E/e' medial:  10.8 LV IVS:        1.10 cm   LV e' lateral:   10.42 cm/s LVOT diam:     1.90 cm   LV E/e' lateral: 12.4 LV SV:         45 LV SV Index:   24 LVOT Area:     2.84 cm  RIGHT VENTRICLE             IVC RV S prime:     17.00 cm/s  IVC diam: 1.20 cm TAPSE (M-mode): 1.1 cm LEFT ATRIUM             Index        RIGHT ATRIUM           Index LA diam:        3.00 cm 1.62 cm/m   RA Area:     11.10 cm LA Vol (A2C):   25.7 ml 13.89 ml/m  RA Volume:   22.40 ml  12.11 ml/m LA Vol (A4C):   22.9 ml 12.38 ml/m LA Biplane Vol: 24.1 ml 13.02 ml/m  AORTIC VALVE AV Area (Vmax):    1.10 cm AV Area (Vmean):   1.00 cm AV Area (VTI):     1.17 cm AV Vmax:           271.00 cm/s AV Vmean:          211.000 cm/s AV VTI:            0.385 m AV Peak Grad:      29.4 mmHg AV Mean Grad:      19.0 mmHg LVOT Vmax:         105.00 cm/s LVOT Vmean:        74.500 cm/s LVOT VTI:          0.159 m LVOT/AV VTI ratio: 0.41  AORTA Ao Root diam: 3.20 cm Ao Asc diam:  3.60 cm MITRAL VALVE MV Area (PHT): 5.46 cm     SHUNTS MV Decel Time: 139 msec     Systemic VTI:  0.16 m MV E velocity: 129.00 cm/s  Systemic Diam: 1.90 cm MV Esker Dever velocity: 49.30 cm/s MV  E/Deyanna Mctier ratio:  2.62 Darryle Decent MD Electronically signed by Darryle Decent MD Signature Date/Time: 09/04/2024/2:34:44 PM    Final    CT CHEST W CONTRAST Result Date: 09/01/2024 CLINICAL DATA:  Clinical history of prostate cancer. 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 Evaluate for thymoma. EXAM: CT CHEST WITH CONTRAST TECHNIQUE: Multidetector CT imaging of the chest was performed during intravenous contrast administration. RADIATION  DOSE REDUCTION: This exam was performed according to the departmental dose-optimization program which includes automated exposure control, adjustment of the mA and/or kV according to patient size and/or use of iterative reconstruction technique. CONTRAST:  75mL OMNIPAQUE  IOHEXOL  350 MG/ML SOLN COMPARISON:  Chest CT 08/30/2024 FINDINGS: Cardiovascular: The heart is enlarged but appears stable. No pericardial effusion. Stable tortuosity, ectasia and calcification of the thoracic aorta. 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 Moderate atherosclerotic calcifications. There are extensive three-vessel coronary artery calcifications and calcifications around the aortic valve. Mediastinum/Nodes: Small scattered mediastinal and hilar lymph nodes but no mass or overt adenopathy. The esophagus is grossly normal. The thyroid  gland is unremarkable. No anterior mediastinal mass to suggest [place] thymoma or other thymic lesion. 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 No pleural effusions or pulmonary edema. Stable scattered nodularity likely related to the underlying lung disease. No discrete worrisome pulmonary lesions. Upper Abdomen: No significant upper abdominal findings. Musculoskeletal: No significant bony findings. IMPRESSION: 1. No anterior mediastinal mass to suggest Beauty Pless thymoma or other thymic lesion. 2. 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 3. Stable severe fibrotic lung disease. 4. Stable eventration of the right hemidiaphragm with overlying vascular crowding and atelectasis. 5. Stable scattered nodularity likely related to the underlying lung disease. No discrete worrisome pulmonary lesions. 6. Stable cardiac enlargement and extensive three-vessel coronary artery calcifications. 7. Aortic atherosclerosis. Aortic Atherosclerosis (ICD10-I70.0). Electronically Signed   By: MYRTIS Stammer M.D.   On: 09/01/2024 22:59   DG CHEST PORT 1 VIEW Result Date: 09/01/2024 EXAM: 1 VIEW(S) XRAY OF THE CHEST 09/01/2024 05:00:00 PM COMPARISON: None available. CLINICAL HISTORY: Tachypnea. FINDINGS: Inspiration. LUNGS AND PLEURA: Scarring and pleural thickening in the right lung base. Infiltration or atelectasis in the left lung base. No pleural effusion. No pneumothorax. HEART AND MEDIASTINUM: Heart size and pulmonary vascularity are normal. Calcification of the aorta. BONES AND SOFT TISSUES: Postoperative changes in both shoulders. Degenerative changes in the spine. IMPRESSION: 1. Infiltration or atelectasis in the left lung base. 2. Scarring and pleural thickening in the right lung base. Electronically signed by: Elsie Gravely MD 09/01/2024 05:08 PM EST RP Workstation: [id number]   DG Swallowing Func-Speech Pathology Result Date: 08/31/2024 Table formatting from the original result was not included. Modified Barium Swallow Study Patient Details Name: Stepehn Eckard MRN: [id number] Date of Birth: [birth year] Today's Date: 08/31/2024 HPI/PMH: HPI: Mr. Kroening is an 83 yo presenting 11/19 after GLF, also found to have multifocal CAP. CT Head showed scalp soft tissue injury and otherwise normal for age appearance of the brain. 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 Swallow eval was ordered due to pt having difficulty swallowing pills, reporting  dysphagia for three weeks PTA. He was being treated for thrush by his pulmonologist, and GI referral was being considered if symptoms persisted after treatment. Pt also described dysarthria and weakness but MRI Brain 11/21 with no acute intracranial abnormality. 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 Airway protection is intact. The strategies most effective in reducing pharyngeal residue were second swallows and liquid washes. Pt prefers to stay on Dys 2 (finely chopped) diet and thin liquids with meds whole  in puree. Discussed findings with MD including potential neurogenic component. May want to consider neurology consult considering acute onset of dysarthria, dysphagia, and cognitive changes. Orally, pt has some extra lingual movements prior to initiation of posterior propulsion, but once initiated, movement is swift. Oral residue is small in volume and cleared spontaneously with Gloyd Happ second swallow. There is more evidence of Geoffry Bannister pharyngeal dysphagia, including reduced anterior hyoid movement, pharyngeal squeeze, and PES opening, which result in moderate amounts of residue. This is noted throughout the pharynx, but especially in the valleculae. Presence of suspected osteophytes may also be contributing. Several positional strategies were attempted, including Taji Sather chin tuck and head turns to both sides, but there was no significant effect. Pt reduced, but did not clear residue, when he used either second swallows or Leaha Cuervo liquid wash. Pt was able to protect his airway throughout self-feeding with trace, transient penetration noted with Tarhonda Hollenberg straw (PAS 2, considered to be normal) but no aspiration observed. Factors that may increase risk of adverse event in presence of aspiration Noe & Lianne 2021):  Factors that may increase risk of adverse event in presence of aspiration Noe & Lianne 2021): Respiratory or GI disease; Reduced cognitive function; Limited mobility; Frail or deconditioned; Reduced saliva Recommendations/Plan: Swallowing Evaluation Recommendations Swallowing Evaluation Recommendations Recommendations: PO diet PO Diet Recommendation: Dysphagia 2 (Finely chopped); Thin liquids (Level 0) Liquid Administration via: Cup; Straw Medication Administration: Whole meds with puree Supervision: Patient able to self-feed; Intermittent supervision/cueing for swallowing strategies Swallowing strategies  : Slow rate; Small bites/sips; Follow solids with liquids; Multiple dry swallows after each bite/sip Postural changes: Position pt fully upright for meals; Stay upright 30-60 min after meals Oral care recommendations: Oral care BID (2x/day) Recommended consults: Other(comment) (consider neuro consult) Treatment Plan Treatment Plan Treatment recommendations: Therapy as outlined in treatment plan below Follow-up recommendations: Skilled nursing-short term rehab (<3 hours/day) Functional status assessment: Patient has had Britney Captain recent decline in their functional status and demonstrates the ability to make significant improvements in function in Bryla Burek reasonable and predictable amount of time. Treatment frequency: Min 2x/week Treatment duration: 2 weeks Interventions: Aspiration precaution training; Oropharyngeal exercises; Compensatory techniques; Patient/family education; Trials of upgraded texture/liquids; Diet toleration management by SLP Recommendations Recommendations for follow up therapy are one component of Nancylee Gaines multi-disciplinary discharge planning process, led by the attending physician.  Recommendations may be updated based on patient status, additional functional criteria and insurance authorization. Assessment: Orofacial Exam: Orofacial Exam Oral Cavity - Dentition: Adequate natural dentition Oral  Motor/Sensory Function: WFL Anatomy: Anatomy: Suspected cervical osteophytes Boluses Administered: Boluses Administered Boluses Administered: Thin liquids (Level 0); Mildly thick liquids (Level 2, nectar thick); Moderately thick liquids (Level 3, honey thick); Puree; Solid  Oral Impairment Domain: Oral Impairment Domain Lip Closure: No labial escape Tongue control during bolus hold: Cohesive bolus between tongue to palatal seal Bolus preparation/mastication: Timely and efficient chewing and mashing Bolus transport/lingual motion: Brisk tongue motion Oral residue: Residue collection on oral structures Location of oral residue : Tongue Initiation of pharyngeal swallow : Posterior angle of the ramus  Pharyngeal Impairment Domain: Pharyngeal Impairment Domain Soft palate elevation: No bolus between soft palate (SP)/pharyngeal wall (PW) Laryngeal elevation: Complete superior movement of thyroid  cartilage with complete approximation of arytenoids to epiglottic petiole Anterior hyoid excursion: Partial anterior movement Epiglottic movement: Complete inversion Laryngeal vestibule closure: Complete, no air/contrast in laryngeal vestibule Pharyngeal stripping wave : Present - diminished Pharyngeal contraction (Azuree Minish/P view only): N/Hannelore Bova Pharyngoesophageal segment opening: Partial distention/partial duration, partial obstruction of flow Tongue  base retraction: Trace column of contrast or air between tongue base and PPW Pharyngeal residue: Collection of residue within or on pharyngeal structures Location of pharyngeal residue: Valleculae; Pyriform sinuses  Esophageal Impairment Domain: Esophageal Impairment Domain Esophageal clearance upright position: Esophageal retention Pill: Pill Consistency administered: Puree Puree: WFL Penetration/Aspiration Scale Score: Penetration/Aspiration Scale Score 1.  Material does not enter airway: Mildly thick liquids (Level 2, nectar thick); Moderately thick liquids (Level 3, honey thick); Puree;  Solid; Pill 2.  Material enters airway, remains ABOVE vocal cords then ejected out: Thin liquids (Level 0) Compensatory Strategies: Compensatory Strategies Compensatory strategies: Yes Straw: Effective Effective Straw: Thin liquid (Level 0) Multiple swallows: Effective Effective Multiple Swallows: Thin liquid (Level 0); Puree; Solid Chin tuck: Ineffective Ineffective Chin Tuck: Puree Liquid wash: Effective Effective Liquid Wash: Puree; Solid Left head turn: Ineffective Ineffective Left Head Turn: Puree Right head turn: Ineffective Ineffective Right Head Turn: Puree   General Information: Caregiver present: No  Diet Prior to this Study: Dysphagia 2 (finely chopped); Thin liquids (Level 0)   Temperature : Normal   Respiratory Status: WFL   Supplemental O2: None (Room air)   History of Recent Intubation: No  Behavior/Cognition: Alert; Pleasant mood; Cooperative Self-Feeding Abilities: Able to self-feed Baseline vocal quality/speech: Hypophonia/low volume (mild) No data recorded Volitional Swallow: Able to elicit Exam Limitations: No limitations Goal Planning: Prognosis for improved oropharyngeal function: Good No data recorded No data recorded Patient/Family Stated Goal: none stated Consulted and agree with results and recommendations: Patient; Physician Pain: Pain Assessment Pain Assessment: Faces Faces Pain Scale: 0 Pain Location: right hip Pain Descriptors / Indicators: Aching; Grimacing Pain Intervention(s): Monitored during session End of Session: Start Time:SLP Start Time (ACUTE ONLY): 1339 Stop Time: SLP Stop Time (ACUTE ONLY): 1358 Time Calculation:SLP Time Calculation (min) (ACUTE ONLY): 19 min Charges: SLP Evaluations $ SLP Speech Visit: 1 Visit SLP Evaluations $BSS Swallow: 1 Procedure $MBS Swallow: 1 Procedure $ SLP EVAL LANGUAGE/SOUND PRODUCTION: 1 Procedure $Swallowing Treatment: 1 Procedure SLP visit diagnosis: SLP Visit Diagnosis: Dysphagia, unspecified (R13.10) Past Medical History: Past Medical  History: Diagnosis Date  Asthma   Depression   Hypertension   Prostate cancer (HCC)   Urinary incontinence  Past Surgical History: Past Surgical History: Procedure Laterality Date  GALLBLADDER SURGERY  1997  REPLACEMENT TOTAL KNEE BILATERAL  2001  2007  surgery replacement Bilateral 2005  TOTAL SHOULDER REPLACEMENT Bilateral 2000 Leita SAILOR., M.Jazilyn Siegenthaler. CCC-SLP Acute Rehabilitation Services Office: [phone] Secure chat preferred 08/31/2024, 3:17 PM  MR BRAIN WO CONTRAST Result Date: 08/30/2024 EXAM: MRI BRAIN WITHOUT CONTRAST 08/30/2024 11:05:00 PM TECHNIQUE: Multiplanar multisequence MRI of the head/brain was performed without the administration of intravenous contrast. COMPARISON: CT head 08/29/2024. CLINICAL HISTORY: Dysarthria, gait unsteadiness, dizziness FINDINGS: BRAIN AND VENTRICLES: No acute infarct. No intracranial hemorrhage. No mass. No midline shift. No hydrocephalus. Normal flow voids. ORBITS: No acute abnormality. SINUSES AND MASTOIDS: No acute abnormality. BONES AND SOFT TISSUES: Normal marrow signal. No acute soft tissue abnormality. IMPRESSION: 1.No acute intracranial abnormality. Electronically signed by: Gilmore Molt MD 08/30/2024 11:55 PM EST RP Workstation: [id number]   CT CHEST WO CONTRAST Result Date: 08/30/2024 EXAM: CT CHEST WITHOUT CONTRAST 08/30/2024 06:19:08 PM TECHNIQUE: CT of the chest was performed without the administration of intravenous contrast. Multiplanar reformatted images are provided for review. Automated exposure control, iterative reconstruction, and/or weight based adjustment of the mA/kV was utilized to reduce the radiation dose to as low as reasonably achievable. COMPARISON: Chest radiograph of 08/29/2024 and PET CT examination of 08/16/2024.  CLINICAL HISTORY: Abnormal xray - lung opacity/opacities. Prostate cancer. *tracking code: Bo* FINDINGS: MEDIASTINUM: Heart: Extensive multivessel coronary artery calcifications. Calcification of the aortic valve leaflets.  Global cardiac size within normal limits. No pericardial effusion. The central airways are clear. No central obstructing lesion. Vasculature: Central pulmonary arteries are of normal caliber. Mild atherosclerotic calcification within the thoracic aorta. 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 Recommend annual imaging followup by CTA or MRA. This recommendation follows 2010 ACCF/AHA/AATS/ACR/ASA/SCA/SCAI/SIR/STS/SVM guidelines for the diagnosis and management of patients with thoracic aortic disease. Circulation. 2010; 121: Z733-z630. Aortic aneurysm NOS (ICD10-I71.9). LYMPH NODES: No mediastinal, hilar or axillary lymphadenopathy. LUNGS AND PLEURA: Subpleural reticulation, bronchiolectasis, and architectural distortion is present in keeping with changes of subpleural fibrotic change. There is superimposed ground-glass infiltrate and consolidation within the left lower lobe with associated traction bronchiectasis. 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 No pneumothorax or pleural effusion. SOFT TISSUES/BONES: Osseous structures are age appropriate. No acute bone abnormality. No lytic or blastic bone lesion. No acute abnormality of the soft tissues. UPPER ABDOMEN: Limited images of the upper abdomen demonstrates no acute abnormality. IMPRESSION: 1. 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 Comparison with prior examinations will be helpful for further evaluation. If none are available, follow-up high-resolution CT imaging of the chest in 6 months would be helpful . 2. 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 Recommend annual imaging  follow-up by CTA or MRA. This recommendation follows 2010 ACCF/AHA/AATS/ACR/ASA/SCA/SCAI/SIR/STS/SVM guidelines for the diagnosis and management of patients with thoracic aortic disease. Circulation. 2010;121:E266-E369. Aortic aneurysm NOS (ICD10-I71.9). 3. 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 Electronically signed by: Dorethia Molt MD 08/30/2024 08:45 PM EST RP Workstation: [id number]   DG Chest 2 View Result Date: 08/29/2024 CLINICAL DATA:  Shortness of breath, dizziness, recent fall EXAM: CHEST - 2 VIEW COMPARISON:  08/31/2021 FINDINGS: Chronic elevation of the right hemidiaphragm. Mild peripheral left lung scattered nodular opacities diffusely worse in the lower lobe, suspicious for multifocal left lung pneumonia. Suspect small pleural effusions bilaterally. Stable chronic parenchymal scarring in the right lung. Overall stable heart size and vascularity. No acute edema pattern CHF. Aorta atherosclerotic. Degenerative changes of the spine. IMPRESSION: Peripheral left lung nodular opacities suspicious for multifocal pneumonia. Stable chronic changes. Electronically Signed   By: CHRISTELLA.  Shick M.D.   On: 08/29/2024 12:59   CT Cervical Spine Wo Contrast Result Date: 08/29/2024 EXAM: CT CERVICAL SPINE WITHOUT CONTRAST 08/29/2024 07:05:00 AM TECHNIQUE: CT of the cervical spine was performed without the administration of intravenous contrast. Multiplanar reformatted images are provided for review. Automated exposure control, iterative reconstruction, and/or weight based adjustment of the mA/kV was utilized to reduce the radiation dose to as low as reasonably achievable. COMPARISON: Head CT 08/29/2024, reported separately. PET CT 08/16/2024. CLINICAL HISTORY: 83 year old male status post fall striking head on bathtub. FINDINGS: CERVICAL SPINE: BONES AND ALIGNMENT: No acute fracture or traumatic malalignment. Hyperostosis related interbody ankylosis in the lower  cervical spine at C6-C7, developing also at the adjacent segments. Superimposed advanced upper cervical facet arthropathy on the right, including vacuum facet arthropathy at multiple levels. DEGENERATIVE CHANGES: No significant cervical spinal stenosis by CT SOFT TISSUES: No prevertebral soft tissue swelling. Bulky calcified carotid bifurcation atherosclerosis  in the bilateral neck. TMJ degeneration. LUNGS: Left greater than right abnormal lung apices appear stable from the recent PET CT, more resemble scarring with architectural distortion than acute lung inflammation. LIMITATIONS: Bilateral shoulder arthroplasty mild streak artifact at the thoracic inlet. IMPRESSION: 1. No acute traumatic injury identified in the cervical spine. 2. 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 3. Stable left greater than right apical lung architectural distortion. Electronically signed by: Helayne Hurst MD 08/29/2024 07:14 AM EST RP Workstation: [id number]   CT Head Wo Contrast Result Date: 08/29/2024 EXAM: CT HEAD WITHOUT CONTRAST 08/29/2024 07:05:00 AM TECHNIQUE: CT of the head was performed without the administration of intravenous contrast. Automated exposure control, iterative reconstruction, and/or weight based adjustment of the mA/kV was utilized to reduce the radiation dose to as low as reasonably achievable. COMPARISON: None available. CLINICAL HISTORY: 83 year old male status post fall striking head on bathtub. FINDINGS: BRAIN AND VENTRICLES: No acute hemorrhage. No evidence of acute infarct. No hydrocephalus. No extra-axial collection. No mass effect or midline shift. Normal brain volume for age. No suspicious intracranial vascular hyperdensity. Calcified atherosclerosis at the skull base. ORBITS: Postoperative changes to both globes. SINUSES: Paranasal sinuses, middle ears and mastoids are well aerated. SOFT TISSUES AND SKULL: Right posterior convexity scalp hematoma and  laceration with confluent soft tissue thickening up to 14 mm and mild soft tissue gas. Underlying calvarium intact. No skull fracture. IMPRESSION: 1. Scalp soft tissue injury. 2. Normal for age non contrast CT appearance of the brain. Electronically signed by: Helayne Hurst MD 08/29/2024 07:09 AM EST RP Workstation: [id number]   DG Hip Unilat W or Wo Pelvis 2-3 Views Right Result Date: 08/29/2024 EXAM: 2 or 3 VIEW(S) XRAY OF THE RIGHT HIP 08/29/2024 06:58:00 AM COMPARISON: MRI 08/07/2024. CLINICAL HISTORY: fall FINDINGS: BONES AND JOINTS: Solid fusion of the pubic symphysis. Sequelae of remote avulsion fracture of the lateral aspect of the right inferior pubic rami. No acute fracture or focal osseous lesion. . Mild bilateral and symmetric degenerative changes involving both hips. SOFT TISSUES: Seed implants identified within the prostate gland. LUMBAR SPINE: Degenerative disc disease noted within the imaged portions of the lower lumbar spine. IMPRESSION: 1. No acute fracture or dislocation. Electronically signed by: Waddell Calk MD 08/29/2024 07:04 AM EST RP Workstation: [id number]   NM PET (PSMA) SKULL TO MID THIGH Result Date: 08/20/2024 EXAM: PROSTATE PET SKULL BASE TO MID THIGHS 08/16/2024 04:55:12 PM TECHNIQUE: RADIOPHARMACEUTICAL: 8.78 mCi F-18 flotufolastaf (Posluma ) injected intravenously. PET imaging was obtained from skull vertex to mid thighs. Computed tomography was used for attenuation correction and localization. Fusion imaging was obtained. COMPARISON: None available. 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 There is intense activity central within the gland which is favored radioactive urine within the prostatic urethra. LYMPH NODES: No PSMA avid pelvic or peritoneal lymph nodes. BONES: No evidence of skeletal metastasis. OTHER PET FINDINGS: Physiologic activity within the salivary glands, liver,  spleen, kidneys, bowel, and urinary bladder. Activity associated with soft tissue nodule between the left adrenal gland and aorta on image 125 is favored benign sympathetic ganglia. No suspicious pulmonary nodules. There is patchy ground glass density throughout the left and right lung, suggesting edema or inflammation. Nonobstructing renal calculi. IMPRESSION: 1. Intense central prostatic activity favored to represent radioactive urine within the prostatic urethra. 2. No evidence of metastatic adenopathy in the abdomen and pelvis. 3. No distant metastatic prostate cancer. 4. Patchy ground-glass densities in  both lungs, suggestive of edema or inflammation. Electronically signed by: Norleen Boxer MD 08/20/2024 01:21 PM EST RP Workstation: [id number]   MR HIP LEFT WO CONTRAST Result Date: 08/10/2024 CLINICAL DATA:  Chronic left hip pain EXAM: MR OF THE LEFT HIP WITHOUT CONTRAST TECHNIQUE: Multiplanar, multisequence MR imaging was performed. No intravenous contrast was administered. COMPARISON:  X-ray 08/09/2023 FINDINGS: Bones: No acute fracture. No dislocation. No femoral head avascular necrosis. Bony pelvis intact without diastasis. Partial osseous fusion of the pubic symphysis and right sacroiliac joint. Mild bone marrow edema at the pubic symphysis. No marrow replacing bone lesion. Articular cartilage and labrum Articular cartilage: Moderate-severe diffuse cartilage thinning and irregularity of the left hip joint with mild reactive subchondral marrow edema. 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 Labrum:  Diffusely degenerated and torn. Joint or bursal effusion Joint effusion:  Small bilateral hip joint effusions. Bursae: Trace right peritrochanteric bursal fluid. Muscles and tendons Muscles and tendons: Left hamstring tendon origin is attenuated in appearance, likely sequela of chronic tearing. Tendinosis and partial-thickness tearing of the right gluteus medius tendon. The left gluteal,  iliopsoas, rectus femoris, and adductor tendons appear intact without tear or significant tendinosis. Normal muscle bulk and signal intensity without edema, atrophy, or fatty infiltration. Other findings Miscellaneous: No soft tissue edema or fluid collection. No inguinal lymphadenopathy. IMPRESSION: 1. Moderate-severe osteoarthritis of the left hip. 2. 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 3. Tendinosis and partial-thickness tearing of the right gluteus medius tendon. 4. Left hamstring tendon origin is attenuated in appearance, likely sequela of chronic tearing. 5. Partial osseous fusion of the pubic symphysis and right sacroiliac joint. Mild bone marrow edema at the pubic symphysis. Electronically Signed   By: Mabel Converse D.O.   On: 08/10/2024 08:58    Microbiology: Recent Results (from the past 240 hours)  Resp panel by RT-PCR (RSV, Flu Elaiza Shoberg&B, Covid) Anterior Nasal Swab     Status: None   Collection Time: 08/29/24  1:13 PM   Specimen: Anterior Nasal Swab  Result Value Ref Range Status   SARS Coronavirus 2 by RT PCR NEGATIVE NEGATIVE Final   Influenza Oaklyn Jakubek by PCR NEGATIVE NEGATIVE Final   Influenza B by PCR NEGATIVE NEGATIVE Final    Comment: (NOTE) The Xpert Xpress SARS-CoV-2/FLU/RSV plus assay is intended as an aid in the diagnosis of influenza from Nasopharyngeal swab specimens and should not be used as Guthrie Lemme sole basis for treatment. Nasal washings and aspirates are unacceptable for Xpert Xpress SARS-CoV-2/FLU/RSV testing.  Fact Sheet for Patients: bloggercourse.com  Fact Sheet for Healthcare Providers: seriousbroker.it  This test is not yet approved or cleared by the [place]  FDA and has been authorized for detection and/or diagnosis of SARS-CoV-2 by FDA under an Emergency Use Authorization (EUA). This EUA will remain in effect (meaning this test can be used) for the duration of the COVID-19 declaration under  Section 564(b)(1) of the Act, 21 U.S.C. section 360bbb-3(b)(1), unless the authorization is terminated or revoked.     Resp Syncytial Virus by PCR NEGATIVE NEGATIVE Final    Comment: (NOTE) Fact Sheet for Patients: bloggercourse.com  Fact Sheet for Healthcare Providers: seriousbroker.it  This test is not yet approved or cleared by the [place]  FDA and has been authorized for detection and/or diagnosis of SARS-CoV-2 by FDA under an Emergency Use Authorization (EUA). This EUA will remain in effect (meaning this test can be used) for the duration of the COVID-19 declaration under Section 564(b)(1) of the Act, 21 U.S.C. section 360bbb-3(b)(1), unless  the authorization is terminated or revoked.  Performed at [facility], [address], [place], [place] [address]      Labs: Basic Metabolic Panel: Recent Labs  Lab 08/30/24 1151 08/31/24 0520 09/01/24 0849 09/02/24 0329 09/03/24 0354 09/05/24 0429  NA 137 138 139 136 140  --   K 4.0 3.9 3.7 3.6 3.7  --   CL 102 103 107 103 105  --   CO2 28 27 25 25 26   --   GLUCOSE 117* 95 76 87 117*  --   BUN 12 11 9 14 15   --   CREATININE 0.59* 0.54* 0.39* 0.46* 0.61 0.48*  CALCIUM  8.7* 8.6* 8.5* 8.2* 8.9  --    Liver Function Tests: No results for input(s): AST, ALT, ALKPHOS, BILITOT, PROT, ALBUMIN in the last 168 hours. No results for input(s): LIPASE, AMYLASE in the last 168 hours. No results for input(s): AMMONIA in the last 168 hours. CBC: Recent Labs  Lab 08/30/24 1151 08/31/24 0520 09/01/24 0849 09/02/24 0329 09/03/24 0354  WBC 10.8* 9.6 10.0 8.2 6.9  HGB 12.8* 13.3 12.6* 11.8* 12.5*  HCT 38.2* 39.8 37.8* 35.0* 36.7*  MCV 96.7 96.4 97.2 96.2 96.1  PLT 241 213 217 202 203   Cardiac Enzymes: No results for input(s): CKTOTAL, CKMB, CKMBINDEX, TROPONINI in the last 168 hours. BNP: BNP (last 3 results) Recent Labs    09/04/24 2201   BNP 28.0    ProBNP (last 3 results) No results for input(s): PROBNP in the last 8760 hours.  CBG: No results for input(s): GLUCAP in the last 168 hours.     Signed:  Meliton Monte MD.  [facility] 09/05/2024, 3:44 PM

## 2024-09-05 NOTE — Progress Notes (Signed)
 This chaplain responded to PMT NP-Amber's consult for creating the Pt. Advance Directive, HCPOA only. The Pt. already has a Living Will.  The Pt. completed AD education with the chaplain. The Pt. wife-Holly and granddaughter are visiting at the bedside. The Pt. answered clarifying questions and is ready to proceed with the notary.  The Pt. named Wirt Hemmerich as his healthcare agent. If this person is unable or unwilling to serve in this role, the Pt. next choice is Madison Senter.  The chaplain is present with the Pt., notary,  witnesses, [name] and granddaughter for the notarizing of The Pt. HCPOA.  The chaplain gave the Pt. the original AD along with two copies. The chaplain scanned the Pt. AD into the Pt. EMR.  The chaplain is available for F/U spiritual care as needed.  Chaplain Leeroy Hummer [phone]

## 2024-09-05 NOTE — Progress Notes (Signed)
  Patient receiving IV IG this morning. Previous reactions to infusion. Notified MD; no premedication needed per MD. Infusion given

## 2024-09-05 NOTE — Progress Notes (Signed)
 NIF and VC as follows: NIF  -22 Best  Worst  -10 VC  1.17  Pred. 36% FEV1  0.76  Pred 29% Pt. Had good effort

## 2024-09-05 NOTE — Progress Notes (Signed)
 Daily Progress Note   Patient Name: Austin [name]       Date: 09/05/2024 DOB: [birth year]  Age: 83 y.o. MRN#: [id number] Attending Physician: Perri DELENA Meliton Mickey., * Primary Care Physician: [name], MD Admit Date: 08/29/2024  Reason for Consultation/Follow-up: Establishing goals of care  Subjective: I have reviewed medical records including EPIC notes: Hospitalist, nursing, speech pathology, neurology MAR: Last IVIG today Labs: Creatinine 0.48 assessed as possible indicator of other health issues  Went to visit patient at bedside - son/Austin [name] present.  Patient was lying in bed awake, alert, oriented, and able to participate in conversation; his speech is mildly slurred.  He does not remember my visit yesterday. No signs or non-verbal gestures of pain or discomfort noted. No respiratory distress, increased work of breathing, or secretions noted.  He denies pain.  --------------------------------------------------------------------------------------------  Advance Care Planning Conversation   Pertinent diagnoses: Severe protein calorie malnutrition, recurrent falls  The patient and/or family consented to a voluntary Advance Care Planning conversation in person. Individuals present for the conversation:  Patient and his son/Austin [name]  Summary of the conversation:   10:30 AM Met for scheduled meeting with patient and his son -emotional support provided.  Allowed space and time for patient to discuss his interval history since admission.  Patient's son has a clear understanding of patient's current acute medical situation.  We discussed next steps in context of patient's goals and wishes.  Reviewed options of discharging home with hospice vs STR per their request.  Patient is unsure  if he would want to work with therapies; however, he is willing to try.  Goal would be for him to try and regain enough strength to mobilize around his home.  Patient understands that if he is not finding benefit/does not want to participate in therapies he would be able to discharge back home with hospice at that time.  Patient and family will make stepwise decisions pending his course at SNF.  Discussed that the goal of rehab is improvement/stabalization of functional status,  which can be a difficult goal to meet for patients with advanced illness and multiple medical conditions. Reviewed what is needed for someone to have a positive rehabilitation experience to include adequate nutritional intake as well as willingness/ability to participate. Education provided on the importance of adequate nutrition in context of rehabilitation.  Discussed interventions that may help with dysphagia/anorexia to include small frequent meals, moist foods or those with sauce/gravy as they take less energy to eat, foods/ nutritional supplements high in protein.  Outpatient palliative care explained and offered-patient and family agreeable.  They understand that outpatient palliative care can continue goals of care discussions after discharge and transition patient to hospice services if/when desired.  Concepts regarding advance directives were discussed in detail.  Patient does have HCPOA documents at a lawyer's office; however, may be hard to obtain.  He wishes to keep his son/Austin [name] as primary HCPOA.  Legal hierarchy reviewed if medical team does not have documents.  He would like to complete HCPOA documents during this admission if possible so it can be added to his medical record.  Concepts regarding code status discussed in detail.  Patient has previously indicated to family he would want a natural death with DNR/DNI - will update EHR.  All questions and concerns addressed. Encouraged to call with questions and/or  concerns. PMT card provided.  Outcome of the conversation: -Discharge to [facility] with outpatient palliative care.  Patient/family will make stepwise decisions regarding hospice enrollment pending his course at rehab -Now DNR/DNI -Outpatient palliative care referral  I spent 47 minutes providing separately identifiable ACP services with the patient and/or surrogate decision maker in a voluntary, in-person conversation discussing the patient's wishes and goals as detailed in the above note.  Length of Stay: 7  Current Medications: Scheduled Meds:   buPROPion   300 mg Oral Daily   enoxaparin  (LOVENOX ) injection  40 mg Subcutaneous Q24H   feeding supplement  237 mL Oral BID BM   fluticasone  furoate-vilanterol  1 puff Inhalation Daily   megestrol   160 mg Oral Daily   montelukast   10 mg Oral QHS   multivitamin with minerals  1 tablet Oral Daily   pantoprazole   80 mg Oral Daily   pyridostigmine   30 mg Oral Q8H    Continuous Infusions:   PRN Meds: ipratropium-albuterol   Physical Exam Vitals and nursing note reviewed.  Constitutional:      General: He is not in acute distress. Pulmonary:     Effort: No respiratory distress.  Skin:    General: Skin is warm and dry.  Neurological:     Mental Status: He is alert and oriented to person, place, and time.     Motor: Weakness present.  Psychiatric:        Attention and Perception: Attention normal.        Speech: Speech is slurred.        Behavior: Behavior is cooperative.        Cognition and Memory: Cognition and memory normal.             Vital Signs: BP 139/86 (BP Location: Right Arm)   Pulse (!) 106   Temp 98.6 F (37 C) (Axillary)   Resp 16   Ht 5' 9 (1.753 m)   Wt 70 kg   SpO2 94%   BMI 22.79 kg/m  SpO2: SpO2: 94 % O2 Device: O2 Device: Room Air O2 Flow Rate:    Intake/output summary:  Intake/Output Summary (Last 24 hours) at 09/05/2024 1030 Last data filed at 09/05/2024 0438 Gross per 24 hour  Intake --  Output  2580 ml  Net -2580 ml   LBM: Last BM Date : 09/04/24 (1115) Baseline Weight: Weight: 70 kg Most recent weight: Weight: 70 kg       Palliative Assessment/Data:  PPS 50%  Patient Active Problem List   Diagnosis Date Noted   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) 09/03/2024   Orthostatic hypotension 09/03/2024   Interstitial lung disease (HCC) 09/03/2024   Ground-level fall 09/03/2024   Laceration of occipital scalp 09/03/2024   Unintentional weight loss 09/03/2024   Protein-calorie malnutrition, severe 08/31/2024   Leg edema 07/09/2024   Senile purpura 07/09/2024   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   Seasonal allergic rhinitis 08/31/2021   Vitamin B12 deficiency 02/10/2021   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 male  with past medical history of  prostate cancer, asthma, HTN, and asthma was admitted on 08/29/2024 after a ground-level fall (recurrent), asthma-COPD overlap syndrome, and severe protein calorie malnutrition secondary to dysphagia.  Neurology was consulted and concern for myasthenia gravis crisis - patient's symptoms have started improving with treatment.   Assessment: Principal Problem:   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 [facility]) Active Problems:   Asthma-COPD overlap syndrome (HCC)   Protein-calorie  malnutrition, severe   Orthostatic hypotension   Interstitial lung disease (HCC)   Ground-level fall   Laceration of occipital scalp   Unintentional weight loss   Recommendations/Plan: Continue current plan of care Now DNR/DNI - durable DNR form completed and placed in shadow chart. Copy was made and will be scanned into Vynca/ACP tab Goal is for discharge to STR with outpatient palliative care to follow.  Patient/family will make stepwise decisions regarding hospice enrollment pending his course at rehab [facility] consulted for: Outpatient palliative care referral Chaplain consulted for: HCPOA document completion, requesting son/Austin [name] PMT will continue to follow peripherally. If there are any imminent needs please call the service directly  Goals of Care and Additional Recommendations: Limitations on Scope of Treatment: No Tracheostomy  Code Status:    Code Status Orders  (From admission, onward)           Start     Ordered   08/29/24 1346  Full code  (Code Status)  Continuous       Question:  By:  Answer:  Consent: discussion documented in EHR   08/29/24 1345           Code Status History     Date Active Date Inactive Code Status Order ID Comments User Context   08/29/2024 1339 08/29/2024 1345 Full Code [id number]  Georgina Basket, MD ED       Prognosis:  Unable to determine  Discharge Planning: Skilled Nursing Facility for rehab with Palliative care service follow-up  Care plan was discussed with patient, patient's son, Dr. Perri, [facility], primary RN  Thank you for allowing the Palliative Medicine Team to assist in the care of this patient.    Jeoffrey CHRISTELLA Sharps, NP  Please contact Palliative Medicine Team phone at [phone] for questions and concerns.   *Portions of this note are a verbal dictation therefore any spelling and/or grammatical errors are due to the  Dragon Medical One system interpretation.

## 2024-09-05 NOTE — Progress Notes (Signed)
 Mobility Specialist Progress Note:    09/05/24 1300  Mobility  Activity Pivoted/transferred from bed to chair  Level of Assistance Contact guard assist, steadying assist  Assistive Device Front wheel walker  Distance Ambulated (ft) 3 ft  Activity Response Tolerated well  Mobility Referral Yes  Mobility visit 1 Mobility  Mobility Specialist Start Time (ACUTE ONLY) 1127  Mobility Specialist Stop Time (ACUTE ONLY) 1140  Mobility Specialist Time Calculation (min) (ACUTE ONLY) 13 min   Pt received in bed agreeable to mobility. Able to get to EOB w/o physical assistance. Contact guard for ambulation. Was able to take a few steps towards the chair w/o fault. Left in chair w/ call bell and personal belongings in reach. All needs met. Chair alarm on. RN in room.  Thersia Minder Mobility Specialist  Please contact vis Secure Chat or  Rehab Office [phone]

## 2024-09-05 NOTE — Plan of Care (Signed)

## 2024-09-05 NOTE — Plan of Care (Signed)
   Problem: Education: Goal: Knowledge of General Education information will improve Description Including pain rating scale, medication(s)/side effects and non-pharmacologic comfort measures Outcome: Progressing

## 2024-09-05 NOTE — TOC Progression Note (Signed)
 Transition of Care [facility]) - Progression Note    Patient Details  Name: Austin [name] MRN: [id number] Date of Birth: 1940/12/17  Transition of [facility]) CM/SW Contact  Isaiah Public, LCSWA Phone Number: 09/05/2024, 11:45 AM  Clinical Narrative:     [facility] with [facility] confirmed facility  can accept patient tomorrow if medically ready. CSW informed MD.  Expected Discharge Plan: Skilled Nursing Facility Barriers to Discharge: SNF Pending bed offer, Continued Medical Work up               Expected Discharge Plan and Services In-house Referral: Clinical Social Work     Living arrangements for the past 2 months: Single Family Home                                       Social Drivers of Health (SDOH) Interventions SDOH Screenings   Food Insecurity: No Food Insecurity (08/29/2024)  Housing: Low Risk  (08/29/2024)  Transportation Needs: No Transportation Needs (08/29/2024)  Utilities: Not At Risk (08/29/2024)  Depression (PHQ2-9): High Risk (07/09/2024)  Financial Resource Strain: Low Risk  (09/27/2023)  Physical Activity: Insufficiently Active (09/27/2023)  Social Connections: Moderately Isolated (08/29/2024)  Stress: No Stress Concern Present (09/27/2023)  Tobacco Use: Medium Risk (08/31/2024)  Health Literacy: Adequate Health Literacy (09/27/2023)    Readmission Risk Interventions     No data to display

## 2024-09-05 NOTE — Progress Notes (Addendum)
 NEUROLOGY CONSULT FOLLOW UP NOTE   Date of service: September 05, 2024 Patient Name: Austin [name] MRN:  [id number] DOB:  Dec 08, 1940  Interval Hx/subjective   Sitting up in bed. Speech about the same.  No SOB.  Going to rehab tomorrow.  No change in exam. 5th session of IVIG hanging now.   Vitals   Vitals:   09/04/24 1602 09/04/24 1930 09/04/24 2330 09/05/24 0330  BP: 130/85 127/81 130/81 127/78  Pulse: (!) 108  (!) 103 98  Resp: (!) 22 20 18 16   Temp: 97.8 F (36.6 C) 98 F (36.7 C) 98.1 F (36.7 C) 98.3 F (36.8 C)  TempSrc: Oral Oral Oral Oral  SpO2: 95%   94%  Weight:      Height:         Body mass index is 22.79 kg/m.  Physical Exam  Sitting up in bed, stable. NAD.   Neurologic Examination   Neuro: Mental Status: Patient is awake, alert, oriented to person, place, month, year, and situation. Cranial Nerves: II: Visual Fields are full. Pupils are equal, round, and reactive to light.   III,IV, VI: EOMI without ptosis. No diplopia. VII: Facial movement is symmetric.  VIII: hearing is intact to voice X: Uvula elevates symmetrically XI: Shoulder shrug is symmetric. XII: tongue is midline without atrophy or fasciculations.  Buccal strength normal.  Tongue strength 4/5. Dysarthria present.  Motor: Tone is normal. Bulk is reduced.  RUE/RLE: 5/5 proximal/shoulder, 5/5 distal/wrist RLE/LLE: 5-/5 proximal/hip, 5/5 knee, 5/5 ankle Sensory: Sensation is symmetric to light touch in the arms and legs. Cerebellar: FNF and HKS are slow but intact bilaterally  Medications  Current Facility-Administered Medications:    buPROPion  (WELLBUTRIN  XL) 24 hr tablet 300 mg, 300 mg, Oral, Daily, Georgina Basket, MD, 300 mg at 09/04/24 1045   enoxaparin  (LOVENOX ) injection 40 mg, 40 mg, Subcutaneous, Q24H, Georgina Basket, MD, 40 mg at 09/04/24 1438   feeding supplement (ENSURE PLUS HIGH PROTEIN) liquid 237 mL, 237 mL, Oral, BID BM, Al-Sultani, Anmar, MD, 237 mL at 09/04/24 1450    fluticasone  furoate-vilanterol (BREO ELLIPTA ) 100-25 MCG/ACT 1 puff, 1 puff, Inhalation, Daily, Georgina Basket, MD, 1 puff at 09/05/24 0849   Immune Globulin  10% (PRIVIGEN ) IV infusion 30 g, 400 mg/kg, Intravenous, Q24 Hr x 5, Khaliqdina, Salman, MD, Last Rate: 0 mL/hr at 09/03/24 1329, 30 g at 09/04/24 0911   ipratropium-albuterol  (DUONEB) 0.5-2.5 (3) MG/3ML nebulizer solution 3 mL, 3 mL, Nebulization, Q6H PRN, Georgina Basket, MD   megestrol  (MEGACE ) tablet 160 mg, 160 mg, Oral, Daily, Al-Sultani, Anmar, MD, 160 mg at 09/04/24 1046   montelukast  (SINGULAIR ) tablet 10 mg, 10 mg, Oral, QHS, Al-Sultani, Anmar, MD, 10 mg at 09/04/24 2317   multivitamin with minerals tablet 1 tablet, 1 tablet, Oral, Daily, Al-Sultani, Anmar, MD, 1 tablet at 09/04/24 1045   pantoprazole  (PROTONIX ) EC tablet 80 mg, 80 mg, Oral, Daily, Al-Sultani, Anmar, MD, 80 mg at 09/04/24 1045   pyridostigmine  (MESTINON ) tablet 30 mg, 30 mg, Oral, Q8H, 30 mg at 09/05/24 0604 **OR** [DISCONTINUED] pyridostigmine  (MESTINON ) injection 1 mg, 1 mg, Intravenous, Q8H, Khaliqdina, Salman, MD, 1 mg at 09/02/24 0619  Labs and Diagnostic Imaging   CBC:  Recent Labs  Lab 09/02/24 0329 09/03/24 0354  WBC 8.2 6.9  HGB 11.8* 12.5*  HCT 35.0* 36.7*  MCV 96.2 96.1  PLT 202 203    Basic Metabolic Panel:  Lab Results  Component Value Date   NA 140 09/03/2024   K 3.7 09/03/2024  CO2 26 09/03/2024   GLUCOSE 117 (H) 09/03/2024   BUN 15 09/03/2024   CREATININE 0.48 (L) 09/05/2024   CALCIUM  8.9 09/03/2024   GFRNONAA >60 09/05/2024   Lipid Panel:  Lab Results  Component Value Date   LDLCALC 73 07/09/2024   HgbA1c:  Lab Results  Component Value Date   HGBA1C 6.0 07/09/2024   Urine Drug Screen: No results found for: LABOPIA, COCAINSCRNUR, LABBENZ, AMPHETMU, THCU, LABBARB  Alcohol Level No results found for: ETH INR No results found for: INR APTT No results found for: APTT AED levels: No results found for:  PHENYTOIN, ZONISAMIDE, LAMOTRIGINE, LEVETIRACETA   MRI Brain No acute abnormalities.    Assessment   [name] is a 83 y.o. male with hx of asthma, depression, HTN, prostrate cancer, urinary incontinence, who initially presented with a ground level fall in the setting of pneumonia, admitted for further workup.   He was noted to have trouble swallowing pills, and upon further discusion with family, had been having trouble swallowing and slurred speech x 2-3 months that is worse towards the end of the day and has been losing weight.    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 He was given 2mg  of IV pyridostigmine  with significant improvement in his dysarthria within 10 mins.  __ Overall improvement with IVIG. -AChR labs neg. -CT chest no thymoma Speech improving with mestinon  use.  Swallowing resolved, now eating breakfast with no issues.  NIFs, FVC stable.  Recommendations  - Last dose of  IVIG today. -MusK Ab pending. - Continue Mestinon  PO 30mg  TID Outpt. -F/U with neurology outpt.  May need immunosuppressive such as cellcept medication but they can determine that at later date.  -ok to discharge to rehab tomorrow.  Neurology will sign off.    - Medications that may worsen or trigger MG exacerbation: Class IA antiarrhythmics, magnesium, flouroquinolones, macrolides, aminoglycosides, penicillamine, curare, interferon alpha, botox, quinine. Use with caution: calcium  channel blocker, beta blockers and statins.

## 2024-09-05 NOTE — Progress Notes (Signed)
 NIF and VC as follows:  NIF -31  VC 1.31L pred 36% FEV1 .86L Pt good effort.

## 2024-09-06 NOTE — Progress Notes (Signed)
 Report called to Taosikaq RN. Code status/diet (dysphagia) reviewed. Pt.had no belongings except eyeglasses. All questions and concerns addressed.   Pt's son has been notified about transfer.

## 2024-09-06 NOTE — TOC Transition Note (Signed)
 Transition of Care [facility]) - Discharge Note   Patient Details  Name: Austin [name] MRN: [id number] Date of Birth: 01/07/1941  [facility]) CM/SW Contact:  Montie LOISE Louder, LCSW Phone Number: 09/06/2024, 11:31 AM   Clinical Narrative:     Patient will Discharge to: [facility] Discharge Date: 09/06/2024 Family Notified: son, [name] Transport By: ROME  Per MD patient is ready for discharge. RN, patient, and facility notified of discharge. Discharge Summary sent to facility. RN given number for [phone]. Ambulance transport requested for patient.   Clinical Social Worker signing off.  Montie Louder, MSW, LCSW Clinical Social Worker     Final next level of care: Skilled Nursing Facility Barriers to Discharge: Barriers Resolved   Patient Goals and CMS Choice Patient states their goals for this hospitalization and ongoing recovery are:: SNF          Discharge Placement              Patient chooses bed at:  [facility]) Patient to be transferred to facility by: ptar Name of family member notified: son- peter Patient and family notified of of transfer: 09/06/24  Discharge Plan and Services Additional resources added to the After Visit Summary for   In-house Referral: Clinical Social Work                                   Social Drivers of Health (SDOH) Interventions SDOH Screenings   Food Insecurity: No Food Insecurity (08/29/2024)  Housing: Low Risk  (08/29/2024)  Transportation Needs: No Transportation Needs (08/29/2024)  Utilities: Not At Risk (08/29/2024)  Depression (PHQ2-9): High Risk (07/09/2024)  Financial Resource Strain: Low Risk  (09/27/2023)  Physical Activity: Insufficiently Active (09/27/2023)  Social Connections: Moderately Isolated (08/29/2024)  Stress: No Stress Concern Present (09/27/2023)  Tobacco Use: Medium Risk (08/31/2024)  Health Literacy: Adequate Health Literacy (09/27/2023)     Readmission Risk  Interventions     No data to display

## 2024-09-06 NOTE — Plan of Care (Signed)

## 2024-09-06 NOTE — Plan of Care (Signed)
  Problem: Education: Goal: Knowledge of General Education information will improve Description: Including pain rating scale, medication(s)/side effects and non-pharmacologic comfort measures 09/06/2024 1051 by Marylu Randine NOVAK, RN Outcome: Adequate for Discharge 09/06/2024 1051 by Marylu Randine NOVAK, RN Outcome: Progressing   Problem: Health Behavior/Discharge Planning: Goal: Ability to manage health-related needs will improve 09/06/2024 1051 by Marylu Randine NOVAK, RN Outcome: Adequate for Discharge 09/06/2024 1051 by Marylu Randine NOVAK, RN Outcome: Progressing   Problem: Clinical Measurements: Goal: Ability to maintain clinical measurements within normal limits will improve 09/06/2024 1051 by Marylu Randine NOVAK, RN Outcome: Adequate for Discharge 09/06/2024 1051 by Marylu Randine NOVAK, RN Outcome: Progressing Goal: Will remain free from infection 09/06/2024 1051 by Marylu Randine NOVAK, RN Outcome: Adequate for Discharge 09/06/2024 1051 by Marylu Randine NOVAK, RN Outcome: Progressing Goal: Diagnostic test results will improve 09/06/2024 1051 by Marylu Randine NOVAK, RN Outcome: Adequate for Discharge 09/06/2024 1051 by Marylu Randine NOVAK, RN Outcome: Progressing Goal: Respiratory complications will improve 09/06/2024 1051 by Marylu Randine NOVAK, RN Outcome: Adequate for Discharge 09/06/2024 1051 by Marylu Randine NOVAK, RN Outcome: Progressing Goal: Cardiovascular complication will be avoided 09/06/2024 1051 by Marylu Randine NOVAK, RN Outcome: Adequate for Discharge 09/06/2024 1051 by Marylu Randine NOVAK, RN Outcome: Progressing   Problem: Activity: Goal: Risk for activity intolerance will decrease 09/06/2024 1051 by Marylu Randine NOVAK, RN Outcome: Adequate for Discharge 09/06/2024 1051 by Marylu Randine NOVAK, RN Outcome: Progressing   Problem: Nutrition: Goal: Adequate nutrition will be maintained 09/06/2024 1051 by Marylu Randine NOVAK, RN Outcome: Adequate for Discharge 09/06/2024 1051 by Marylu Randine NOVAK, RN Outcome:  Progressing   Problem: Elimination: Goal: Will not experience complications related to bowel motility 09/06/2024 1051 by Marylu Randine NOVAK, RN Outcome: Adequate for Discharge 09/06/2024 1051 by Marylu Randine NOVAK, RN Outcome: Progressing Goal: Will not experience complications related to urinary retention 09/06/2024 1051 by Marylu Randine NOVAK, RN Outcome: Adequate for Discharge 09/06/2024 1051 by Marylu Randine NOVAK, RN Outcome: Progressing   Problem: Safety: Goal: Ability to remain free from injury will improve 09/06/2024 1051 by Marylu Randine NOVAK, RN Outcome: Adequate for Discharge 09/06/2024 1051 by Marylu Randine NOVAK, RN Outcome: Progressing   Problem: Skin Integrity: Goal: Risk for impaired skin integrity will decrease 09/06/2024 1051 by Marylu Randine NOVAK, RN Outcome: Adequate for Discharge 09/06/2024 1051 by Marylu Randine NOVAK, RN Outcome: Progressing

## 2024-09-06 NOTE — Discharge Summary (Addendum)
 Physician Discharge Summary  Austin [name] FMW:[id number] DOB: 1941-09-03 DOA: 08/29/2024  PCP: Austin Worth HERO, MD  Admit date: 08/29/2024 Discharge date: 09/06/2024  Time spent: 40 minutes  Recommendations for Outpatient Follow-up:  Follow outpatient CBC/CMP  Follow with neurology outpatient for myasthenia gravis Caution with new meds, avoid meds that can exacerbate myasthenia Needs cards follow up outpatient for abnormal echo Follow weight loss outpatient Needs follow up with pulm outpatient for concern for interstitial lung disease Follow MUSK ab Follow blood pressure outpatient, BP meds on hold with orthostasis   Follow dilation of ascending thoracic aorta Palliative care to follow outpatient  Staples out after 7-10 days from placement  Discharge Diagnoses:  Principal Problem:   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 [facility]) Active Problems:   Asthma-COPD overlap syndrome (HCC)   Protein-calorie malnutrition, severe   Orthostatic hypotension   Interstitial lung disease (HCC)   Ground-level fall   Laceration of occipital scalp   Unintentional weight loss   Discharge Condition: stable  Diet recommendation: heart healthy   Filed Weights   08/29/24 1524  Weight: 70 kg   History of present illness:   The patient is an 83 year old male with PMHx of asthma/COPD overlap syndrome, chronic rhinitis, recent oral thrush treated with fluconazole , HTN, HLD, depression, prostate cancer who presented to the ED on 08/29/2024 after an episode of dizziness and subsequent fall with trauma to the right side of head leading to Austin [name] right parietal laceration, without loss of consciousness. On initial evaluation in the ED, he reportedly denied lightheadedness or dizziness prior to falling, though he later endorsed several weeks of dizziness and unsteadiness while ambulating, requiring the use of Austin [name] walker, and was later noted to be dizzy and unable to ambulate in the ED. MRI was deferred at that time.  He  was found to be orthostatic and antihypertensives discontinued.    Due to persistent symptoms, an MRI was obtained which was negative.  Neurology was consulted and he was thought to have symptoms consistent with myasthenia gravis.    Now s/p IVIGx5+ mestinon .  Plan for outpatient neurology follow up.    Plan is for short term rehab with palliative care to follow outpatient.  Hospital Course:  Assessment and Plan:  # Myasthenia Gravis with c/f myasthenic crisis  # Dysphagia  Dysarthria  Ataxia - Patient presents with multiple falls over last 3-4 weeks with new onset unsteadiness while ambulating necessitating the use of Austin [name] walker, associated with some dizziness. Additionally, he has noted dysphagia and dysarthria around the same period of time, initially attributed to thrush but not resolving with treatment with 14 days of fluconazole  - MRI brain negative for acute intracranial abnormalities - SLP following for dysphagia and dysarthria/cognition - recommended dysphagia-2 with thin liquids, but concern for some underlying neurogenic component.  - PT/OT following - Neuro consulted - indicated clinical presentation most consistent with MG with concern for myasthenic crisis. Was noted to have significant improvement with dysarthria after administration of IV pyridostigmine  2 mg. - IVIG 400mg /Kg every 24 hours x 5 doses, completed 11/26.   - AChR negative - MusK Ab pending - CT chest with contrast negative for thymoma - Mestinon  PO 30mg  TID - stable for discharge when bed available from neurologic standpoint.  Plan for outpatient follow up.  Avoid meds that can exacerbate MG.     # Orthostatic hypotension - resolved - Patient noted to have positive orthostatics with SBP dropping from 131 to 92 on sitting to standing with worsening dizziness and  inability to maintain static stance due to symptoms - Could explain patient's dizziness and perceived ataxia - Discontinued Amlodipine  and Losartan  on  11/21 - will tolerate slightly higher Bps - follow symptoms outpatient    # Sinus tachycardia # Intermittent tachypnea # Mod AS # RV dysfunction  - Onset coincided with starting IVIG infusions - HR 98-112, BP stable, RR 20-24, BP stable - Premedication before IVIG infusion did not make Austin [name] difference - TTE (11/25) showed LVEF 65-70% with normal LV function and mild concentric LVH, IV septal flattening consistent with RV pressure/volume overload.  RV moderately enlarged with moderately reduced systolic function.  Moderate AS, mild AR, trivial MR/TR.  No pericardial effusion.  IVC collapsible - case was discussed by previous hospitalist who discussed with [name] from cardiology who indicated RV dysfunction not very significant given collapsible IVC. No significant valvular abnormalities. - should follow abnormal echo findings with cardiology outpatient -  CT PE protocol without PE, LE US  pending.    # Possible hospital delirium - Patient appeared somewhat confused on 11/23 AM, unable to recall much of the previous days events. Suspect an element of delirium and disorientation as he was moved to 6E from 6N  - Has not had any delirium or confusion episodes since - Oriented to person, place, time, and situation - Delirium precautions    # Chronic Lung Disease  Emphysema  Concern for Chronic Interstitial Lung Disease  Abnormal CT Scan - CT 11/26 without PE, but bronchiectasis and consolidation in the LLL (pneumonia or active alveolitis superimposed on chronic interstitial lung disease), severe emphysematous changes with peripheral interstitial changes - afebrile with no leukocytosis and no cough.  Clinically, very low suspicion for pneumonia. - Procalcitonin <0.1, further supporting unlikely diagnosis of bacterial pneumonia - Of note, the patient recently underwent Austin [name] PET scan on 08/20/2024 which noted patchy ground glass densities in both lungs suggestive of edema or inflammation.   Outpatient pulmonology ordered Austin [name] high-resolution CT scan for further characterization of the pulmonary findings on PET scan, which was scheduled on the same day of his admission - CT chest (11/20) showed subpleural fibrosis with traction bronchiectasis and superimposed left lower lobe ground glass/consolidation concerning for an underlying interstitial lung disease, most consistent with UIP - Will defer further advanced imaging/workup to outpatient pulmonology    # Ground-level falls # Right parietal scalp laceration and hematoma - Presented after GLF in the bathroom, reportedly due to loss of balance, with trauma to head - CT head showed right posterior convexity scalp hematoma and laceration with confluent soft tissue thickening and mild soft tissue gas, underlying calvarium intact with no skull fracture. 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  Stable severe fibrotic lung disease.  Stable scattered nodularity related to underlying lung disease.  (See report).  PET 08/16/2024 with intense central prostatic activity favored to represent radioactive urine within prostatic urethra, no evidence of metastatic adenopathy in abdomen/pelvis.  No distant metastatic prostate cancer.   - RD following - recommended addition of appetite stimulant, Ensure Plus  high-protein, multivitamin, thiamine  supplementation, assist with feeding - will start remeron   # Asthma/COPD overlap syndrome Follows closely  with Dr. Meade in outpatient pulmonology Continue home Advair  [facility] with formulary alternative Breo Ellipta  PRN DuoNebs   # Chronic rhinitis Continue home Singulair  10 mg at bedtime   # Hypertension Continue home amlodipine  5 mg daily Will hold losartan  at discharge for now   # MDD Continue home Wellbutrin  300 mg daily   #HLD Home Lipitor discontinued given myasthenic crisis   # GERD PPI   # GOC Appreciate palliative care assistance, plan for discharge to short term rehab with outpatient palliative following.  Now DNR/DNI.  Patient/family will make stepwise decisions pending course at rehab.       Procedures: Echo IMPRESSIONS     1. Left ventricular ejection fraction, by estimation, is 65 to 70%. The  left ventricle has normal function. The left ventricle has no regional  wall motion abnormalities. There is mild concentric left ventricular  hypertrophy. Indeterminate diastolic  filling due to E-Lyris Hitchman fusion. There is the interventricular septum is  flattened in systole and diastole, consistent with right ventricular  pressure and volume overload.   2. Right ventricular systolic function is moderately reduced. The right  ventricular size is moderately enlarged. Tricuspid regurgitation signal is  inadequate for assessing PA pressure.   3. The mitral valve is grossly normal. Trivial mitral valve  regurgitation. No evidence of mitral stenosis.   4. The aortic valve was not well visualized. Aortic valve regurgitation  is mild. Moderate aortic valve stenosis. Aortic valve area, by VTI  measures 1.17 cm. Aortic valve mean gradient measures 19.0 mmHg. Aortic  valve Vmax measures 2.71 m/s.   5. The inferior vena cava is normal in size with greater than 50%  respiratory variability, suggesting right atrial pressure of 3 mmHg.   LE US  Results pending   Consultations: neurology  Discharge Exam: Vitals:   09/06/24 0751 09/06/24 0854  BP: (!) 141/87   Pulse: 100 100  Resp:  18 19  Temp: 97.9 F (36.6 C)   SpO2: 95% 96%   Discussed discharge No complaints Called wife, no answer  General: No acute distress. Cardiovascular: RRR Lungs: unlabored Neurological: Alert and oriented 3. Moves all extremities 4.  Mildly dysarthric speech. Extremities: No clubbing or cyanosis. No edema.   Discharge Instructions   Discharge Instructions     Ambulatory referral to Neurology   Complete by: As directed    An appointment is requested in approximately: 4 weeks   Call MD for:  difficulty breathing, headache or visual disturbances   Complete by: As directed    Call MD for:  extreme fatigue   Complete by: As directed    Call MD for:  hives   Complete by: As directed    Call MD for:  persistant dizziness or light-headedness   Complete by: As directed    Call MD for:  persistant nausea and vomiting   Complete by: As directed    Call MD for:  redness, tenderness, or signs of infection (pain, swelling, redness, odor or green/yellow discharge around incision site)   Complete by: As directed    Call MD for:  severe uncontrolled pain   Complete by: As directed    Call MD for:  temperature >100.4   Complete by: As directed    Diet - low sodium heart healthy   Complete by: As directed    Discharge instructions   Complete by: As directed  You were seen after Beverley Sherrard fall.   You had an extensive workup and were ultimately found to have myasthenia gravis. You were treated with IVIG and started on mestinon .  You'll need to follow with neurology as an outpatient.  Your outpatient doctors should use caution when prescribing new medicines as some medicines can exacerbate myasthenia.  Your ultrasound of your heart was abnormal and warrants outpatient cardiology follow up.  Your weight loss warrants additional follow up outpatient.  We'll start you on [place] Layson medicine called remeron .    Your CT scan has findings concerning for interstitial lung disease.  Follow these findings with  your pulmonologist as an outpatient.   We'll plan to have palliative care follow with you as an outpatient.  Return for new, recurrent, or worsening symptoms.  Please ask your PCP to request records from this hospitalization so they know what was done and what the next steps will be.   Discharge wound care:   Complete by: As directed    Removal of staples outpatient after 7-10 days from placement   Increase activity slowly   Complete by: As directed       Allergies as of 09/06/2024   No Known Allergies      Medication List     STOP taking these medications    amLODipine  5 MG tablet Commonly known as: NORVASC    atorvastatin  80 MG tablet Commonly known as: LIPITOR   fluconazole  200 MG tablet Commonly known as: DIFLUCAN    LORazepam  0.5 MG tablet Commonly known as: ATIVAN    losartan  100 MG tablet Commonly known as: COZAAR        TAKE these medications    Advair  HFA 45-21 MCG/ACT inhaler Generic drug: fluticasone -salmeterol Inhale 2 puffs into the lungs 2 (two) times daily.   albuterol  (2.5 MG/3ML) 0.083% nebulizer solution Commonly known as: PROVENTIL  USE 1 VIAL BY NEBULIZATION EVERY 4 HOURS AS NEEDED FOR WHEEZING OR SHORTNESS OF BREATH/DYSPNEA. J45.998   albuterol  108 (90 Base) MCG/ACT inhaler Commonly known as: VENTOLIN  HFA INHALE 1-2 PUFFS BY MOUTH EVERY 6 HOURS AS NEEDED FOR WHEEZE OR SHORTNESS OF BREATH   AMBULATORY NON FORMULARY MEDICATION walker Dispense 1 Dx code: F74.447 Use as needed   baclofen  10 MG tablet Commonly known as: LIORESAL  Take 0.5-1 tablets (5-10 mg total) by mouth 4 (four) times daily as needed for muscle spasms.   buPROPion  300 MG 24 hr tablet Commonly known as: Wellbutrin  XL Take 1 tablet (300 mg total) by mouth daily.   feeding supplement Liqd Take 237 mLs by mouth 2 (two) times daily between meals.   fenofibrate  micronized 200 MG capsule Commonly known as: LOFIBRA TAKE 1 CAPSULE BY MOUTH EVERY DAY   FREESTYLE LITE  test strip Generic drug: glucose blood CHECK BLOOD SUGAR THREE TIMES Laylanie Kruczek DAY E11.9   gabapentin  100 MG capsule Commonly known as: NEURONTIN  TAKE 1 CAPSULE BY MOUTH EVERYDAY AT BEDTIME   ibuprofen  800 MG tablet Commonly known as: ADVIL  TAKE 1 TABLET BY MOUTH EVERY 8 HOURS AS NEEDED What changed: reasons to take this   loratadine  10 MG tablet Commonly known as: CLARITIN  TAKE 1 TABLET BY MOUTH EVERY DAY   magnesium oxide 400 (240 Mg) MG tablet Commonly known as: MAG-OX TAKE 1 TABLET BY MOUTH EVERY DAY   mirtazapine  15 MG tablet Commonly known as: Remeron  Take 1 tablet (15 mg total) by mouth at bedtime.   montelukast  10 MG tablet Commonly known as: SINGULAIR  TAKE 1 TABLET BY MOUTH EVERYDAY AT BEDTIME  multivitamin chewable tablet Chew 1 tablet by mouth daily.   nitrofurantoin (macrocrystal-monohydrate) 100 MG capsule Commonly known as: MACROBID Take 100 mg by mouth daily.   omeprazole  20 MG capsule Commonly known as: PRILOSEC TAKE 2 CAPSULES BY MOUTH EVERY DAY   pyridostigmine  60 MG tablet Commonly known as: MESTINON  Take 0.5 tablets (30 mg total) by mouth every 8 (eight) hours.   solifenacin 5 MG tablet Commonly known as: VESICARE Take 5 mg by mouth daily.   Spacer/Aero-Holding Raguel French 1 each by Does not apply route as directed.   thiamine  100 MG tablet Commonly known as: VITAMIN B1 Take 1 tablet (100 mg total) by mouth daily.   Unistik 2 Extra Misc Use to check blood sugar 3 times daily/ Dx E11.9   VITAMIN B 12 PO Take 1,000 Units by mouth.   vitamin D3 50 MCG (2000 UT) Caps Take 1 capsule by mouth daily.               Discharge Care Instructions  (From admission, onward)           Start     Ordered   09/05/24 0000  Discharge wound care:       Comments: Removal of staples outpatient after 7-10 days from placement   09/05/24 1516           No Known Allergies  Contact information for after-discharge care     Destination      [facility] [place], [place] .   Service: Skilled Nursing Contact information: [address] [place]  [phone]                      The results of significant diagnostics from this hospitalization (including imaging, microbiology, ancillary and laboratory) are listed below for reference.    Significant Diagnostic Studies: VAS US  LOWER EXTREMITY VENOUS (DVT) Result Date: 09/05/2024  Lower Venous DVT Study Patient Name:  OCTAVIOUS ZIDEK  Date of Exam:   09/05/2024 Medical Rec #: [id number]       Accession #:    [id number] Date of Birth: Dec 21, 1940       Patient Gender: M Patient Age:   68 years Exam Location:  [facility] Procedure:      VAS US  LOWER EXTREMITY VENOUS (DVT) Referring Phys: Seraphim Trow POWELL JR --------------------------------------------------------------------------------  Indications: Positive D-dimer, recent fall.  Comparison Study: No prior exam. Performing Technologist: Edilia Elden Appl  Examination Guidelines: Henchy Mccauley complete evaluation includes B-mode imaging, spectral Doppler, color Doppler, and power Doppler as needed of all accessible portions of each vessel. Bilateral testing is considered an integral part of Kanon Colunga complete examination. Limited examinations for reoccurring indications may be performed as noted. The reflux portion of the exam is performed with the patient in reverse Trendelenburg.  +---------+---------------+---------+-----------+----------+--------------+ RIGHT    CompressibilityPhasicitySpontaneityPropertiesThrombus Aging +---------+---------------+---------+-----------+----------+--------------+ CFV      Full           Yes      Yes                                 +---------+---------------+---------+-----------+----------+--------------+ SFJ      Full           Yes      Yes                                 +---------+---------------+---------+-----------+----------+--------------+  FV Prox  Full                                                         +---------+---------------+---------+-----------+----------+--------------+ FV Mid   Full                                                        +---------+---------------+---------+-----------+----------+--------------+ FV DistalFull                                                        +---------+---------------+---------+-----------+----------+--------------+ PFV      Full                                                        +---------+---------------+---------+-----------+----------+--------------+ POP      Full           Yes      Yes                                 +---------+---------------+---------+-----------+----------+--------------+ PTV      Full                                                        +---------+---------------+---------+-----------+----------+--------------+ PERO     Full                                                        +---------+---------------+---------+-----------+----------+--------------+   +---------+---------------+---------+-----------+----------+--------------+ LEFT     CompressibilityPhasicitySpontaneityPropertiesThrombus Aging +---------+---------------+---------+-----------+----------+--------------+ CFV      Full           Yes      Yes                                 +---------+---------------+---------+-----------+----------+--------------+ SFJ      Full           Yes      Yes                                 +---------+---------------+---------+-----------+----------+--------------+ FV Prox  Full                                                        +---------+---------------+---------+-----------+----------+--------------+  FV Mid   Full                                                        +---------+---------------+---------+-----------+----------+--------------+ FV DistalFull                                                         +---------+---------------+---------+-----------+----------+--------------+ PFV      Full                                                        +---------+---------------+---------+-----------+----------+--------------+ POP      Full           Yes      Yes                                 +---------+---------------+---------+-----------+----------+--------------+ PTV      Full                                                        +---------+---------------+---------+-----------+----------+--------------+ PERO     Full                                                        +---------+---------------+---------+-----------+----------+--------------+     Summary: BILATERAL: - No evidence of deep vein thrombosis seen in the lower extremities, bilaterally. -No evidence of popliteal cyst, bilaterally.   *See table(s) above for measurements and observations. Electronically signed by Lonni Gaskins MD on 09/05/2024 at 4:43:35 PM.    Final    CT Angio Chest Pulmonary Embolism (PE) W or WO Contrast Result Date: 09/05/2024 EXAM: CTA CHEST 09/05/2024 12:38:25 AM TECHNIQUE: CTA of the chest was performed without and with the administration of 75 mL of intravenous iohexol  (OMNIPAQUE ) 350 MG/ML injection. Multiplanar reformatted images are provided for review. MIP images are provided for review. Automated exposure control, iterative reconstruction, and/or weight based adjustment of the mA/kV was utilized to reduce the radiation dose to as low as reasonably achievable. COMPARISON: Comparison with 09/01/2024, inspiration and motion artifact limited examination. CLINICAL HISTORY: Pulmonary embolism (PE) suspected, low to intermediate prob, positive D-dimer. FINDINGS: PULMONARY ARTERIES: Pulmonary arteries are adequately opacified for evaluation. No acute pulmonary embolus. Main pulmonary artery is normal in caliber. MEDIASTINUM: Cardiac enlargement. No pericardial effusions. Calcification  in the coronary arteries and aortic valve. Calcification in the aorta. 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 LYMPH NODES: No mediastinal, hilar or axillary lymphadenopathy. 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 Peripheral interstitial changes likely representing fibrosis. Bronchiectasis and consolidation in the left lower lung may represent  pneumonia or active alveolitis superimposed on chronic interstitial lung disease. Lung changes are similar to the prior study. No evidence of pleural effusion or pneumothorax. UPPER ABDOMEN: Surgical absence of the gallbladder. Duodenal lipoma. SOFT TISSUES AND BONES: Postoperative right shoulder arthroplasty. Degenerative changes in the spine. No acute soft tissue abnormality. IMPRESSION: 1. No evidence of pulmonary embolism. 2. Bronchiectasis and consolidation in the left lower lung may represent pneumonia or active alveolitis superimposed on chronic interstitial lung disease; changes are similar to the prior study. 3. 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 4. 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 Electronically signed by: Elsie Gravely MD 09/05/2024 12:51 AM EST RP Workstation: [id number]   ECHOCARDIOGRAM COMPLETE Result Date: 09/04/2024    ECHOCARDIOGRAM REPORT   Patient Name:   AMEAR STROJNY Date of Exam: 09/04/2024 Medical Rec #:  [id number]      Height:       69.0 in Accession #:    [id number]     Weight:       154.3 lb Date of Birth:  29-Sep-1941      BSA:          1.850 m Patient Age:    83 years       BP:           123/79 mmHg Patient Gender: M              HR:           106 bpm. Exam Location:  Inpatient Procedure: 2D Echo, Cardiac Doppler and Color Doppler (Both Spectral and Color            Flow Doppler were utilized during procedure). Indications:    Dyspnea  History:        Patient has no prior history of Echocardiogram  examinations.                 COPD, Signs/Symptoms:Hypotension; Risk Factors:Dyslipidemia.  Sonographer:    Sherlean Dubin Referring Phys: [id number] DUFFY AL-SULTANI IMPRESSIONS  1. Left ventricular ejection fraction, by estimation, is 65 to 70%. The left ventricle has normal function. The left ventricle has no regional wall motion abnormalities. There is mild concentric left ventricular hypertrophy. Indeterminate diastolic filling due to E-Skilar Marcou fusion. There is the interventricular septum is flattened in systole and diastole, consistent with right ventricular pressure and volume overload.  2. Right ventricular systolic function is moderately reduced. The right ventricular size is moderately enlarged. Tricuspid regurgitation signal is inadequate for assessing PA pressure.  3. The mitral valve is grossly normal. Trivial mitral valve regurgitation. No evidence of mitral stenosis.  4. The aortic valve was not well visualized. Aortic valve regurgitation is mild. Moderate aortic valve stenosis. Aortic valve area, by VTI measures 1.17 cm. Aortic valve mean gradient measures 19.0 mmHg. Aortic valve Vmax measures 2.71 m/s.  5. The inferior vena cava is normal in size with greater than 50% respiratory variability, suggesting right atrial pressure of 3 mmHg. FINDINGS  Left Ventricle: Left ventricular ejection fraction, by estimation, is 65 to 70%. The left ventricle has normal function. The left ventricle has no regional wall motion abnormalities. The left ventricular internal cavity size was normal in size. There is  mild concentric left ventricular hypertrophy. The interventricular septum is flattened in systole and diastole, consistent with right ventricular pressure and volume overload. Indeterminate diastolic filling due to E-Brianni Manthe fusion. Right Ventricle: The right ventricular size is moderately enlarged. No increase in right ventricular wall thickness.  Right ventricular systolic function is moderately reduced. Tricuspid  regurgitation signal is inadequate for assessing PA pressure. Left Atrium: Left atrial size was normal in size. Right Atrium: Right atrial size was normal in size. Pericardium: There is no evidence of pericardial effusion. Mitral Valve: The mitral valve is grossly normal. Trivial mitral valve regurgitation. No evidence of mitral valve stenosis. Tricuspid Valve: The tricuspid valve is grossly normal. Tricuspid valve regurgitation is trivial. No evidence of tricuspid stenosis. Aortic Valve: The aortic valve was not well visualized. Aortic valve regurgitation is mild. Moderate aortic stenosis is present. Aortic valve mean gradient measures 19.0 mmHg. Aortic valve peak gradient measures 29.4 mmHg. Aortic valve area, by VTI measures 1.17 cm. Pulmonic Valve: The pulmonic valve was grossly normal. Pulmonic valve regurgitation is trivial. No evidence of pulmonic stenosis. Aorta: The aortic root and ascending aorta are structurally normal, with no evidence of dilitation. Venous: The inferior vena cava is normal in size with greater than 50% respiratory variability, suggesting right atrial pressure of 3 mmHg. IAS/Shunts: The atrial septum is grossly normal.  LEFT VENTRICLE PLAX 2D LVIDd:         4.40 cm   Diastology LVIDs:         2.85 cm   LV e' medial:    11.90 cm/s LV PW:         1.10 cm   LV E/e' medial:  10.8 LV IVS:        1.10 cm   LV e' lateral:   10.42 cm/s LVOT diam:     1.90 cm   LV E/e' lateral: 12.4 LV SV:         45 LV SV Index:   24 LVOT Area:     2.84 cm  RIGHT VENTRICLE             IVC RV S prime:     17.00 cm/s  IVC diam: 1.20 cm TAPSE (M-mode): 1.1 cm LEFT ATRIUM             Index        RIGHT ATRIUM           Index LA diam:        3.00 cm 1.62 cm/m   RA Area:     11.10 cm LA Vol (A2C):   25.7 ml 13.89 ml/m  RA Volume:   22.40 ml  12.11 ml/m LA Vol (A4C):   22.9 ml 12.38 ml/m LA Biplane Vol: 24.1 ml 13.02 ml/m  AORTIC VALVE AV Area (Vmax):    1.10 cm AV Area (Vmean):   1.00 cm AV Area (VTI):      1.17 cm AV Vmax:           271.00 cm/s AV Vmean:          211.000 cm/s AV VTI:            0.385 m AV Peak Grad:      29.4 mmHg AV Mean Grad:      19.0 mmHg LVOT Vmax:         105.00 cm/s LVOT Vmean:        74.500 cm/s LVOT VTI:          0.159 m LVOT/AV VTI ratio: 0.41  AORTA Ao Root diam: 3.20 cm Ao Asc diam:  3.60 cm MITRAL VALVE MV Area (PHT): 5.46 cm     SHUNTS MV Decel Time: 139 msec     Systemic VTI:  0.16 m MV E velocity: 129.00  cm/s  Systemic Diam: 1.90 cm MV Jovonni Borquez velocity: 49.30 cm/s MV E/Jerrik Housholder ratio:  2.62 Darryle Decent MD Electronically signed by Darryle Decent MD Signature Date/Time: 09/04/2024/2:34:44 PM    Final    CT CHEST W CONTRAST Result Date: 09/01/2024 CLINICAL DATA:  Clinical history of prostate cancer. 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 Evaluate for thymoma. EXAM: CT CHEST WITH CONTRAST TECHNIQUE: Multidetector CT imaging of the chest was performed during intravenous contrast administration. RADIATION DOSE REDUCTION: This exam was performed according to the departmental dose-optimization program which includes automated exposure control, adjustment of the mA and/or kV according to patient size and/or use of iterative reconstruction technique. CONTRAST:  75mL OMNIPAQUE  IOHEXOL  350 MG/ML SOLN COMPARISON:  Chest CT 08/30/2024 FINDINGS: Cardiovascular: The heart is enlarged but appears stable. No pericardial effusion. Stable tortuosity, ectasia and calcification of the thoracic aorta. 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 Moderate atherosclerotic calcifications. There are extensive three-vessel coronary artery calcifications and calcifications around the aortic valve. Mediastinum/Nodes: Small scattered mediastinal and hilar lymph nodes but no mass or overt adenopathy. The esophagus is grossly normal. The thyroid  gland is unremarkable. No anterior mediastinal mass to suggest Magin Balbi thymoma or other thymic lesion. Lungs/Pleura: Stable severe fibrotic lung disease.  Stable eventration of the right hemidiaphragm with overlying vascular crowding and atelectasis. No pleural effusions or pulmonary edema. Stable scattered nodularity likely related to the underlying lung disease. No discrete worrisome pulmonary lesions. Upper Abdomen: No significant upper abdominal findings. Musculoskeletal: No significant bony findings. IMPRESSION: 1. No anterior mediastinal mass to suggest Estle Sabella thymoma or other thymic lesion. 2. 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 3. Stable severe fibrotic lung disease. 4. Stable eventration of the right hemidiaphragm with overlying vascular crowding and atelectasis. 5. Stable scattered nodularity likely related to the underlying lung disease. No discrete worrisome pulmonary lesions. 6. Stable cardiac enlargement and extensive three-vessel coronary artery calcifications. 7. Aortic atherosclerosis. Aortic Atherosclerosis (ICD10-I70.0). Electronically Signed   By: MYRTIS Stammer M.D.   On: 09/01/2024 22:59   DG CHEST PORT 1 VIEW Result Date: 09/01/2024 EXAM: 1 VIEW(S) XRAY OF THE CHEST 09/01/2024 05:00:00 PM COMPARISON: None available. CLINICAL HISTORY: Tachypnea. FINDINGS: Inspiration. LUNGS AND PLEURA: Scarring and pleural thickening in the right lung base. Infiltration or atelectasis in the left lung base. No pleural effusion. No pneumothorax. HEART AND MEDIASTINUM: Heart size and pulmonary vascularity are normal. Calcification of the aorta. BONES AND SOFT TISSUES: Postoperative changes in both shoulders. Degenerative changes in the spine. IMPRESSION: 1. Infiltration or atelectasis in the left lung base. 2. Scarring and pleural thickening in the right lung base. Electronically signed by: Elsie Gravely MD 09/01/2024 05:08 PM EST RP Workstation: [id number]   DG Swallowing Func-Speech Pathology Result Date: 08/31/2024 Table formatting from the original result was not included. Modified Barium  Swallow Study Patient Details Name: Gildardo Tickner MRN: [id number] Date of Birth: 1941/07/26 Today's Date: 08/31/2024 HPI/PMH: HPI: Mr. Sida is an 83 yo presenting 11/19 after GLF, also found to have multifocal CAP. CT Head showed scalp soft tissue injury and otherwise normal for age appearance of the brain. 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 Swallow eval was ordered due to pt having difficulty swallowing pills, reporting dysphagia for three weeks PTA. He was being treated for thrush by his pulmonologist, and GI referral was being considered if symptoms persisted after treatment. Pt also described dysarthria and weakness but MRI Brain  11/21 with no acute intracranial abnormality. 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 Airway protection is intact. The strategies most effective in reducing pharyngeal residue were second swallows and liquid washes. Pt prefers to stay on Dys 2 (finely chopped) diet and thin liquids with meds whole in puree. Discussed findings with MD including potential neurogenic component. May want to consider neurology consult considering acute onset of dysarthria, dysphagia, and cognitive changes. Orally, pt has some extra lingual movements prior to initiation of posterior propulsion, but once initiated, movement is swift. Oral residue is small in volume and cleared spontaneously with Wilbon Obenchain second swallow. There is more evidence of Vernal Hritz pharyngeal dysphagia, including reduced anterior hyoid movement, pharyngeal squeeze, and PES opening, which result in moderate amounts of residue. This is noted throughout the pharynx, but especially in the valleculae. Presence of suspected osteophytes may also be contributing.  Several positional strategies were attempted, including Salinda Snedeker chin tuck and head turns to both sides, but there was no significant effect. Pt reduced, but did not clear residue, when he used either second swallows or Andretta Ergle liquid wash. Pt was able to protect his airway throughout self-feeding with trace, transient penetration noted with Cleva Camero straw (PAS 2, considered to be normal) but no aspiration observed. Factors that may increase risk of adverse event in presence of aspiration Noe & Lianne 2021): Factors that may increase risk of adverse event in presence of aspiration Noe & Lianne 2021): Respiratory or GI disease; Reduced cognitive function; Limited mobility; Frail or deconditioned; Reduced saliva Recommendations/Plan: Swallowing Evaluation Recommendations Swallowing Evaluation Recommendations Recommendations: PO diet PO Diet Recommendation: Dysphagia 2 (Finely chopped); Thin liquids (Level 0) Liquid Administration via: Cup; Straw Medication Administration: Whole meds with puree Supervision: Patient able to self-feed; Intermittent supervision/cueing for swallowing strategies Swallowing strategies  : Slow rate; Small bites/sips; Follow solids with liquids; Multiple dry swallows after each bite/sip Postural changes: Position pt fully upright for meals; Stay upright 30-60 min after meals Oral care recommendations: Oral care BID (2x/day) Recommended consults: Other(comment) (consider neuro consult) Treatment Plan Treatment Plan Treatment recommendations: Therapy as outlined in treatment plan below Follow-up recommendations: Skilled nursing-short term rehab (<3 hours/day) Functional status assessment: Patient has had Shabana Armentrout recent decline in their functional status and demonstrates the ability to make significant improvements in function in Sydney Hasten reasonable and predictable amount of time. Treatment frequency: Min 2x/week Treatment duration: 2 weeks Interventions: Aspiration precaution training; Oropharyngeal exercises;  Compensatory techniques; Patient/family education; Trials of upgraded texture/liquids; Diet toleration management by SLP Recommendations Recommendations for follow up therapy are one component of Jiselle Sheu multi-disciplinary discharge planning process, led by the attending physician.  Recommendations may be updated based on patient status, additional functional criteria and insurance authorization. Assessment: Orofacial Exam: Orofacial Exam Oral Cavity - Dentition: Adequate natural dentition Oral Motor/Sensory Function: WFL Anatomy: Anatomy: Suspected cervical osteophytes Boluses Administered: Boluses Administered Boluses Administered: Thin liquids (Level 0); Mildly thick liquids (Level 2, nectar thick); Moderately thick liquids (Level 3, honey thick); Puree; Solid  Oral Impairment Domain: Oral Impairment Domain Lip Closure: No labial escape Tongue control during bolus hold: Cohesive bolus between tongue to palatal seal Bolus preparation/mastication: Timely and efficient chewing and mashing Bolus transport/lingual motion: Brisk tongue motion Oral residue: Residue collection on oral structures Location of oral residue : Tongue Initiation of pharyngeal swallow : Posterior angle of the ramus  Pharyngeal Impairment Domain: Pharyngeal Impairment Domain Soft palate elevation: No bolus between soft palate (SP)/pharyngeal wall (PW) Laryngeal elevation: Complete superior movement of thyroid  cartilage with complete approximation of arytenoids to epiglottic petiole Anterior hyoid excursion: Partial anterior movement Epiglottic movement: Complete inversion Laryngeal vestibule closure: Complete, no air/contrast in laryngeal vestibule Pharyngeal stripping wave : Present - diminished Pharyngeal contraction (Audris Speaker/P view only): N/Charnika Herbst Pharyngoesophageal segment opening: Partial distention/partial duration, partial obstruction of flow Tongue base retraction: Trace column of contrast or air between tongue base and PPW Pharyngeal residue: Collection  of residue within or on pharyngeal structures Location of pharyngeal residue: Valleculae; Pyriform sinuses  Esophageal Impairment Domain: Esophageal Impairment Domain Esophageal clearance upright position: Esophageal retention Pill: Pill Consistency administered: Puree Puree: WFL Penetration/Aspiration Scale Score: Penetration/Aspiration Scale Score 1.  Material does not enter airway: Mildly thick liquids (Level 2, nectar thick); Moderately thick liquids (Level 3, honey thick); Puree; Solid; Pill 2.  Material enters airway, remains ABOVE vocal cords then ejected out: Thin liquids (Level 0) Compensatory Strategies: Compensatory Strategies Compensatory strategies: Yes Straw: Effective Effective Straw: Thin liquid (Level 0) Multiple swallows: Effective Effective Multiple Swallows: Thin liquid (Level 0); Puree; Solid Chin tuck: Ineffective Ineffective Chin Tuck: Puree Liquid wash: Effective Effective Liquid Wash: Puree; Solid Left head turn: Ineffective Ineffective Left Head Turn: Puree Right head turn: Ineffective Ineffective Right Head Turn: Puree   General Information: Caregiver present: No  Diet Prior to this Study: Dysphagia 2 (finely chopped); Thin liquids (Level 0)   Temperature : Normal   Respiratory Status: WFL   Supplemental O2: None (Room air)   History of Recent Intubation: No  Behavior/Cognition: Alert; Pleasant mood; Cooperative Self-Feeding Abilities: Able to self-feed Baseline vocal quality/speech: Hypophonia/low volume (mild) No data recorded Volitional Swallow: Able to elicit Exam Limitations: No limitations Goal Planning: Prognosis for improved oropharyngeal function: Good No data recorded No data recorded Patient/Family Stated Goal: none stated Consulted and agree with results and recommendations: Patient; Physician Pain: Pain Assessment Pain Assessment: Faces Faces Pain Scale: 0 Pain Location: right hip Pain Descriptors / Indicators: Aching; Grimacing Pain Intervention(s): Monitored during session  End of Session: Start Time:SLP Start Time (ACUTE ONLY): 1339 Stop Time: SLP Stop Time (ACUTE ONLY): 1358 Time Calculation:SLP Time Calculation (min) (ACUTE ONLY): 19 min Charges: SLP Evaluations $ SLP Speech Visit: 1 Visit SLP Evaluations $BSS Swallow: 1 Procedure $MBS Swallow: 1 Procedure $ SLP EVAL LANGUAGE/SOUND PRODUCTION: 1 Procedure $Swallowing Treatment: 1 Procedure SLP visit diagnosis: SLP Visit Diagnosis: Dysphagia, unspecified (R13.10) Past Medical History: Past Medical History: Diagnosis Date  Asthma   Depression   Hypertension   Prostate cancer (HCC)   Urinary incontinence  Past Surgical History: Past Surgical History: Procedure Laterality Date  GALLBLADDER SURGERY  1997  REPLACEMENT TOTAL KNEE BILATERAL  2001  2007  surgery replacement Bilateral 2005  TOTAL SHOULDER REPLACEMENT Bilateral 2000 Leita SAILOR., M.Hasana Alcorta. CCC-SLP Acute Rehabilitation Services Office: [phone] Secure chat preferred 08/31/2024, 3:17 PM  MR BRAIN WO CONTRAST Result Date: 08/30/2024 EXAM: MRI BRAIN WITHOUT CONTRAST 08/30/2024 11:05:00 PM TECHNIQUE: Multiplanar multisequence MRI of the head/brain was performed without the administration of intravenous contrast. COMPARISON: CT head 08/29/2024. CLINICAL HISTORY: Dysarthria, gait unsteadiness, dizziness FINDINGS: BRAIN AND VENTRICLES: No acute infarct. No intracranial hemorrhage. No mass. No midline shift. No hydrocephalus. Normal flow voids. ORBITS: No acute abnormality. SINUSES AND MASTOIDS: No acute abnormality. BONES AND SOFT TISSUES: Normal marrow signal. No acute soft tissue abnormality. IMPRESSION: 1.No acute intracranial abnormality. Electronically signed by: Gilmore Molt MD 08/30/2024 11:55 PM EST  RP Workstation: [id number]   CT CHEST WO CONTRAST Result Date: 08/30/2024 EXAM: CT CHEST WITHOUT CONTRAST 08/30/2024 06:19:08 PM TECHNIQUE: CT of the chest was performed without the administration of intravenous contrast. Multiplanar reformatted images are provided for  review. Automated exposure control, iterative reconstruction, and/or weight based adjustment of the mA/kV was utilized to reduce the radiation dose to as low as reasonably achievable. COMPARISON: Chest radiograph of 08/29/2024 and PET CT examination of 08/16/2024. CLINICAL HISTORY: Abnormal xray - lung opacity/opacities. Prostate cancer. *tracking code: Bo* FINDINGS: MEDIASTINUM: Heart: Extensive multivessel coronary artery calcifications. Calcification of the aortic valve leaflets. Global cardiac size within normal limits. No pericardial effusion. The central airways are clear. No central obstructing lesion. Vasculature: Central pulmonary arteries are of normal caliber. Mild atherosclerotic calcification within the thoracic aorta. 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 Recommend annual imaging followup by CTA or MRA. This recommendation follows 2010 ACCF/AHA/AATS/ACR/ASA/SCA/SCAI/SIR/STS/SVM guidelines for the diagnosis and management of patients with thoracic aortic disease. Circulation. 2010; 121: Z733-z630. Aortic aneurysm NOS (ICD10-I71.9). LYMPH NODES: No mediastinal, hilar or axillary lymphadenopathy. LUNGS AND PLEURA: Subpleural reticulation, bronchiolectasis, and architectural distortion is present in keeping with changes of subpleural fibrotic change. There is superimposed ground-glass infiltrate and consolidation within the left lower lobe with associated traction bronchiectasis. 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 No pneumothorax or pleural effusion. SOFT TISSUES/BONES: Osseous structures are age appropriate. No acute bone abnormality. No lytic or blastic bone lesion. No acute abnormality of the soft tissues. UPPER ABDOMEN: Limited images of the upper abdomen demonstrates no acute abnormality. IMPRESSION: 1. 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 Comparison with prior examinations will be helpful for further evaluation. If none are available, follow-up high-resolution CT imaging of the chest in 6 months would be helpful . 2. 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 Recommend annual imaging follow-up by CTA or MRA. This recommendation follows 2010 ACCF/AHA/AATS/ACR/ASA/SCA/SCAI/SIR/STS/SVM guidelines for the diagnosis and management of patients with thoracic aortic disease. Circulation. 2010;121:E266-E369. Aortic aneurysm NOS (ICD10-I71.9). 3. 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 Electronically signed by: Dorethia Molt MD 08/30/2024 08:45 PM EST RP Workstation: [id number]   DG Chest 2 View Result Date: 08/29/2024 CLINICAL DATA:  Shortness of breath, dizziness, recent fall EXAM: CHEST - 2 VIEW COMPARISON:  08/31/2021 FINDINGS: Chronic elevation of the right hemidiaphragm. Mild peripheral left lung scattered nodular opacities diffusely worse in the lower lobe, suspicious for multifocal left lung pneumonia. Suspect small pleural effusions bilaterally. Stable chronic parenchymal scarring in the right lung. Overall stable heart size and vascularity. No acute edema pattern CHF. Aorta atherosclerotic. Degenerative changes of the spine. IMPRESSION: Peripheral left lung nodular opacities suspicious for multifocal pneumonia. Stable chronic changes. Electronically Signed   By: CHRISTELLA.  Shick M.D.   On: 08/29/2024 12:59   CT Cervical Spine Wo Contrast Result Date: 08/29/2024 EXAM: CT CERVICAL SPINE WITHOUT CONTRAST 08/29/2024 07:05:00 AM TECHNIQUE: CT of the cervical spine was performed without the administration of intravenous contrast. Multiplanar reformatted images are  provided for review. Automated exposure control, iterative reconstruction, and/or weight based adjustment of the mA/kV was utilized to reduce the radiation dose to as low as reasonably achievable. COMPARISON: Head CT 08/29/2024, reported separately. PET CT 08/16/2024. CLINICAL  HISTORY: 83 year old male status post fall striking head on bathtub. FINDINGS: CERVICAL SPINE: BONES AND ALIGNMENT: No acute fracture or traumatic malalignment. 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 Superimposed advanced upper cervical facet arthropathy on the right, including vacuum facet arthropathy at multiple levels. DEGENERATIVE CHANGES: No significant cervical spinal stenosis by CT SOFT TISSUES: No prevertebral soft tissue swelling. Bulky calcified carotid bifurcation atherosclerosis in the bilateral neck. TMJ degeneration. LUNGS: Left greater than right abnormal lung apices appear stable from the recent PET CT, more resemble scarring with architectural distortion than acute lung inflammation. LIMITATIONS: Bilateral shoulder arthroplasty mild streak artifact at the thoracic inlet. IMPRESSION: 1. No acute traumatic injury identified in the cervical spine. 2. 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 3. Stable left greater than right apical lung architectural distortion. Electronically signed by: Helayne Hurst MD 08/29/2024 07:14 AM EST RP Workstation: [id number]   CT Head Wo Contrast Result Date: 08/29/2024 EXAM: CT HEAD WITHOUT CONTRAST 08/29/2024 07:05:00 AM TECHNIQUE: CT of the head was performed without the administration of intravenous contrast. Automated exposure control, iterative reconstruction, and/or weight based adjustment of the mA/kV was utilized to reduce the radiation dose to as low as reasonably achievable. COMPARISON: None available. CLINICAL HISTORY: 83 year old male status post fall striking  head on bathtub. FINDINGS: BRAIN AND VENTRICLES: No acute hemorrhage. No evidence of acute infarct. No hydrocephalus. No extra-axial collection. No mass effect or midline shift. Normal brain volume for age. No suspicious intracranial vascular hyperdensity. Calcified atherosclerosis at the skull base. ORBITS: Postoperative changes to both globes. SINUSES: Paranasal sinuses, middle ears and mastoids are well aerated. SOFT TISSUES AND SKULL: Right posterior convexity scalp hematoma and laceration with confluent soft tissue thickening up to 14 mm and mild soft tissue gas. Underlying calvarium intact. No skull fracture. IMPRESSION: 1. Scalp soft tissue injury. 2. Normal for age non contrast CT appearance of the brain. Electronically signed by: Helayne Hurst MD 08/29/2024 07:09 AM EST RP Workstation: [id number]   DG Hip Unilat W or Wo Pelvis 2-3 Views Right Result Date: 08/29/2024 EXAM: 2 or 3 VIEW(S) XRAY OF THE RIGHT HIP 08/29/2024 06:58:00 AM COMPARISON: MRI 08/07/2024. CLINICAL HISTORY: fall FINDINGS: BONES AND JOINTS: Solid fusion of the pubic symphysis. Sequelae of remote avulsion fracture of the lateral aspect of the right inferior pubic rami. No acute fracture or focal osseous lesion. . Mild bilateral and symmetric degenerative changes involving both hips. SOFT TISSUES: Seed implants identified within the prostate gland. LUMBAR SPINE: Degenerative disc disease noted within the imaged portions of the lower lumbar spine. IMPRESSION: 1. No acute fracture or dislocation. Electronically signed by: Waddell Calk MD 08/29/2024 07:04 AM EST RP Workstation: [id number]   NM PET (PSMA) SKULL TO MID THIGH Result Date: 08/20/2024 EXAM: PROSTATE PET SKULL BASE TO MID THIGHS 08/16/2024 04:55:12 PM TECHNIQUE: RADIOPHARMACEUTICAL: 8.78 mCi F-18 flotufolastaf (Posluma ) injected intravenously. PET imaging was obtained from skull vertex to mid thighs. Computed tomography was used for attenuation correction and localization.  Fusion imaging was obtained. COMPARISON: None available. 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 There is intense activity central within the gland which is favored radioactive urine within the prostatic urethra. LYMPH NODES: No PSMA avid pelvic or peritoneal lymph nodes. BONES: No evidence of skeletal metastasis. OTHER PET FINDINGS: Physiologic activity within the salivary glands, liver, spleen, kidneys, bowel, and urinary bladder. Activity associated with soft tissue  nodule between the left adrenal gland and aorta on image 125 is favored benign sympathetic ganglia. No suspicious pulmonary nodules. There is patchy ground glass density throughout the left and right lung, suggesting edema or inflammation. Nonobstructing renal calculi. IMPRESSION: 1. Intense central prostatic activity favored to represent radioactive urine within the prostatic urethra. 2. No evidence of metastatic adenopathy in the abdomen and pelvis. 3. No distant metastatic prostate cancer. 4. Patchy ground-glass densities in both lungs, suggestive of edema or inflammation. Electronically signed by: Norleen Boxer MD 08/20/2024 01:21 PM EST RP Workstation: [id number]   MR HIP LEFT WO CONTRAST Result Date: 08/10/2024 CLINICAL DATA:  Chronic left hip pain EXAM: MR OF THE LEFT HIP WITHOUT CONTRAST TECHNIQUE: Multiplanar, multisequence MR imaging was performed. No intravenous contrast was administered. COMPARISON:  X-ray 08/09/2023 FINDINGS: Bones: No acute fracture. No dislocation. No femoral head avascular necrosis. Bony pelvis intact without diastasis. Partial osseous fusion of the pubic symphysis and right sacroiliac joint. Mild bone marrow edema at the pubic symphysis. No marrow replacing bone lesion. Articular cartilage and labrum Articular cartilage: Moderate-severe diffuse cartilage thinning and irregularity of the left hip joint with mild reactive  subchondral marrow edema. 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 Labrum:  Diffusely degenerated and torn. Joint or bursal effusion Joint effusion:  Small bilateral hip joint effusions. Bursae: Trace right peritrochanteric bursal fluid. Muscles and tendons Muscles and tendons: Left hamstring tendon origin is attenuated in appearance, likely sequela of chronic tearing. Tendinosis and partial-thickness tearing of the right gluteus medius tendon. The left gluteal, iliopsoas, rectus femoris, and adductor tendons appear intact without tear or significant tendinosis. Normal muscle bulk and signal intensity without edema, atrophy, or fatty infiltration. Other findings Miscellaneous: No soft tissue edema or fluid collection. No inguinal lymphadenopathy. IMPRESSION: 1. Moderate-severe osteoarthritis of the left hip. 2. 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 3. Tendinosis and partial-thickness tearing of the right gluteus medius tendon. 4. Left hamstring tendon origin is attenuated in appearance, likely sequela of chronic tearing. 5. Partial osseous fusion of the pubic symphysis and right sacroiliac joint. Mild bone marrow edema at the pubic symphysis. Electronically Signed   By: Mabel Converse D.O.   On: 08/10/2024 08:58    Microbiology: Recent Results (from the past 240 hours)  Resp panel by RT-PCR (RSV, Flu Nazim Kadlec&B, Covid) Anterior Nasal Swab     Status: None   Collection Time: 08/29/24  1:13 PM   Specimen: Anterior Nasal Swab  Result Value Ref Range Status   SARS Coronavirus 2 by RT PCR NEGATIVE NEGATIVE Final   Influenza Ayssa Bentivegna by PCR NEGATIVE NEGATIVE Final   Influenza B by PCR NEGATIVE NEGATIVE Final    Comment: (NOTE) The Xpert Xpress SARS-CoV-2/FLU/RSV plus assay is intended as an aid in the diagnosis of influenza from Nasopharyngeal swab specimens and should not be used as Earnest Mcgillis sole basis for treatment. Nasal washings and aspirates are unacceptable for Xpert Xpress  SARS-CoV-2/FLU/RSV testing.  Fact Sheet for Patients: bloggercourse.com  Fact Sheet for Healthcare Providers: seriousbroker.it  This test is not yet approved or cleared by the [place]  FDA and has been authorized for detection and/or diagnosis of SARS-CoV-2 by FDA under an Emergency Use Authorization (EUA). This EUA will remain in effect (meaning this test can be used) for the duration of the COVID-19 declaration under Section 564(b)(1) of the Act, 21 U.S.C. section 360bbb-3(b)(1), unless the authorization is terminated or revoked.     Resp Syncytial Virus by PCR NEGATIVE NEGATIVE Final  Comment: (NOTE) Fact Sheet for Patients: bloggercourse.com  Fact Sheet for Healthcare Providers: seriousbroker.it  This test is not yet approved or cleared by the [place]  FDA and has been authorized for detection and/or diagnosis of SARS-CoV-2 by FDA under an Emergency Use Authorization (EUA). This EUA will remain in effect (meaning this test can be used) for the duration of the COVID-19 declaration under Section 564(b)(1) of the Act, 21 U.S.C. section 360bbb-3(b)(1), unless the authorization is terminated or revoked.  Performed at [facility], [address], [place], [place] [address]      Labs: Basic Metabolic Panel: Recent Labs  Lab 08/30/24 1151 08/31/24 0520 09/01/24 0849 09/02/24 0329 09/03/24 0354 09/05/24 0429  NA 137 138 139 136 140  --   K 4.0 3.9 3.7 3.6 3.7  --   CL 102 103 107 103 105  --   CO2 28 27 25 25 26   --   GLUCOSE 117* 95 76 87 117*  --   BUN 12 11 9 14 15   --   CREATININE 0.59* 0.54* 0.39* 0.46* 0.61 0.48*  CALCIUM  8.7* 8.6* 8.5* 8.2* 8.9  --    Liver Function Tests: No results for input(s): AST, ALT, ALKPHOS, BILITOT, PROT, ALBUMIN in the last 168 hours. No results for input(s): LIPASE, AMYLASE in the last 168  hours. No results for input(s): AMMONIA in the last 168 hours. CBC: Recent Labs  Lab 08/30/24 1151 08/31/24 0520 09/01/24 0849 09/02/24 0329 09/03/24 0354  WBC 10.8* 9.6 10.0 8.2 6.9  HGB 12.8* 13.3 12.6* 11.8* 12.5*  HCT 38.2* 39.8 37.8* 35.0* 36.7*  MCV 96.7 96.4 97.2 96.2 96.1  PLT 241 213 217 202 203   Cardiac Enzymes: No results for input(s): CKTOTAL, CKMB, CKMBINDEX, TROPONINI in the last 168 hours. BNP: BNP (last 3 results) Recent Labs    09/04/24 2201  BNP 28.0    ProBNP (last 3 results) No results for input(s): PROBNP in the last 8760 hours.  CBG: No results for input(s): GLUCAP in the last 168 hours.     Signed:  Meliton Monte MD.  [facility] 09/06/2024, 9:55 AM

## 2024-09-07 ENCOUNTER — Other Ambulatory Visit: Payer: Self-pay | Admitting: Internal Medicine

## 2024-09-07 DIAGNOSIS — K219 Gastro-esophageal reflux disease without esophagitis: Secondary | ICD-10-CM

## 2024-09-09 ENCOUNTER — Emergency Department (HOSPITAL_COMMUNITY)

## 2024-09-09 ENCOUNTER — Encounter (HOSPITAL_COMMUNITY): Payer: Self-pay

## 2024-09-09 ENCOUNTER — Other Ambulatory Visit: Payer: Self-pay

## 2024-09-09 ENCOUNTER — Observation Stay (HOSPITAL_COMMUNITY)
Admission: EM | Admit: 2024-09-09 | Discharge: 2024-09-14 | DRG: 394 | Disposition: A | Attending: Internal Medicine | Admitting: Internal Medicine

## 2024-09-09 DIAGNOSIS — K219 Gastro-esophageal reflux disease without esophagitis: Secondary | ICD-10-CM | POA: Diagnosis present

## 2024-09-09 DIAGNOSIS — K922 Gastrointestinal hemorrhage, unspecified: Secondary | ICD-10-CM | POA: Diagnosis not present

## 2024-09-09 DIAGNOSIS — Z515 Encounter for palliative care: Secondary | ICD-10-CM

## 2024-09-09 DIAGNOSIS — G7000 Myasthenia gravis without (acute) exacerbation: Secondary | ICD-10-CM | POA: Diagnosis not present

## 2024-09-09 DIAGNOSIS — R0600 Dyspnea, unspecified: Secondary | ICD-10-CM

## 2024-09-09 DIAGNOSIS — R627 Adult failure to thrive: Secondary | ICD-10-CM | POA: Diagnosis present

## 2024-09-09 DIAGNOSIS — I10 Essential (primary) hypertension: Secondary | ICD-10-CM | POA: Diagnosis present

## 2024-09-09 LAB — CBC
HCT: 32.9 % — ABNORMAL LOW (ref 39.0–52.0)
Hemoglobin: 10.8 g/dL — ABNORMAL LOW (ref 13.0–17.0)
MCH: 32.9 pg (ref 26.0–34.0)
MCHC: 32.8 g/dL (ref 30.0–36.0)
MCV: 100.3 fL — ABNORMAL HIGH (ref 80.0–100.0)
Platelets: 242 K/uL (ref 150–400)
RBC: 3.28 MIL/uL — ABNORMAL LOW (ref 4.22–5.81)
RDW: 14.4 % (ref 11.5–15.5)
WBC: 6.5 K/uL (ref 4.0–10.5)
nRBC: 0 % (ref 0.0–0.2)

## 2024-09-09 LAB — ABO/RH: ABO/RH(D): O POS

## 2024-09-09 LAB — COMPREHENSIVE METABOLIC PANEL WITH GFR
ALT: 60 U/L — ABNORMAL HIGH (ref 0–44)
AST: 42 U/L — ABNORMAL HIGH (ref 15–41)
Albumin: 2.4 g/dL — ABNORMAL LOW (ref 3.5–5.0)
Alkaline Phosphatase: 66 U/L (ref 38–126)
Anion gap: 9 (ref 5–15)
BUN: 13 mg/dL (ref 8–23)
CO2: 25 mmol/L (ref 22–32)
Calcium: 8.6 mg/dL — ABNORMAL LOW (ref 8.9–10.3)
Chloride: 102 mmol/L (ref 98–111)
Creatinine, Ser: 0.51 mg/dL — ABNORMAL LOW (ref 0.61–1.24)
GFR, Estimated: >60 mL/min (ref >60)
Glucose, Bld: 122 mg/dL — ABNORMAL HIGH (ref 70–99)
Potassium: 3.9 mmol/L (ref 3.5–5.1)
Sodium: 136 mmol/L (ref 135–145)
Total Bilirubin: 0.6 mg/dL (ref 0.0–1.2)
Total Protein: 8.1 g/dL (ref 6.5–8.1)

## 2024-09-09 LAB — PROCALCITONIN: Procalcitonin: <0.1 ng/mL

## 2024-09-09 LAB — TYPE AND SCREEN
ABO/RH(D): O POS
Antibody Screen: NEGATIVE

## 2024-09-09 LAB — RESP PANEL BY RT-PCR (RSV, FLU A&B, COVID)  RVPGX2
Influenza A by PCR: NEGATIVE
Influenza B by PCR: NEGATIVE
Resp Syncytial Virus by PCR: NEGATIVE
SARS Coronavirus 2 by RT PCR: NEGATIVE

## 2024-09-09 LAB — I-STAT VENOUS BLOOD GAS, ED
Acid-Base Excess: 2 mmol/L (ref 0.0–2.0)
Bicarbonate: 26.5 mmol/L (ref 20.0–28.0)
Calcium, Ion: 1.17 mmol/L (ref 1.15–1.40)
HCT: 34 % — ABNORMAL LOW (ref 39.0–52.0)
Hemoglobin: 11.6 g/dL — ABNORMAL LOW (ref 13.0–17.0)
O2 Saturation: 81 %
Potassium: 3.9 mmol/L (ref 3.5–5.1)
Sodium: 142 mmol/L (ref 135–145)
TCO2: 28 mmol/L (ref 22–32)
pCO2, Ven: 39.1 mmHg — ABNORMAL LOW (ref 44–60)
pH, Ven: 7.438 — ABNORMAL HIGH (ref 7.25–7.43)
pO2, Ven: 44 mmHg (ref 32–45)

## 2024-09-09 LAB — I-STAT CHEM 8, ED
BUN: 15 mg/dL (ref 8–23)
Calcium, Ion: 1.21 mmol/L (ref 1.15–1.40)
Chloride: 101 mmol/L (ref 98–111)
Creatinine, Ser: 0.5 mg/dL — ABNORMAL LOW (ref 0.61–1.24)
Glucose, Bld: 123 mg/dL — ABNORMAL HIGH (ref 70–99)
HCT: 40 % (ref 39.0–52.0)
Hemoglobin: 13.6 g/dL (ref 13.0–17.0)
Potassium: 4 mmol/L (ref 3.5–5.1)
Sodium: 143 mmol/L (ref 135–145)
TCO2: 27 mmol/L (ref 22–32)

## 2024-09-09 LAB — CBC WITH DIFFERENTIAL/PLATELET
Abs Immature Granulocytes: 0.03 K/uL (ref 0.00–0.07)
Basophils Absolute: 0 K/uL (ref 0.0–0.1)
Basophils Relative: 0 %
Eosinophils Absolute: 0.1 K/uL (ref 0.0–0.5)
Eosinophils Relative: 1 %
HCT: 40.1 % (ref 39.0–52.0)
Hemoglobin: 13.1 g/dL (ref 13.0–17.0)
Immature Granulocytes: 0 %
Lymphocytes Relative: 11 %
Lymphs Abs: 0.9 K/uL (ref 0.7–4.0)
MCH: 32.5 pg (ref 26.0–34.0)
MCHC: 32.7 g/dL (ref 30.0–36.0)
MCV: 99.5 fL (ref 80.0–100.0)
Monocytes Absolute: 1.3 K/uL — ABNORMAL HIGH (ref 0.1–1.0)
Monocytes Relative: 16 %
Neutro Abs: 5.8 K/uL (ref 1.7–7.7)
Neutrophils Relative %: 72 %
Platelets: 278 K/uL (ref 150–400)
RBC: 4.03 MIL/uL — ABNORMAL LOW (ref 4.22–5.81)
RDW: 14.3 % (ref 11.5–15.5)
WBC: 8.2 K/uL (ref 4.0–10.5)
nRBC: 0 % (ref 0.0–0.2)

## 2024-09-09 LAB — I-STAT CG4 LACTIC ACID, ED
Lactic Acid, Venous: 2.1 mmol/L (ref 0.5–1.9)
Lactic Acid, Venous: 1.8 mmol/L (ref 0.5–1.9)

## 2024-09-09 LAB — BRAIN NATRIURETIC PEPTIDE: B Natriuretic Peptide: 41.4 pg/mL (ref 0.0–100.0)

## 2024-09-09 LAB — PROTIME-INR
INR: 1.1 (ref 0.8–1.2)
Prothrombin Time: 14.8 s (ref 11.4–15.2)

## 2024-09-09 LAB — T4, FREE: Free T4: 0.97 ng/dL (ref 0.61–1.12)

## 2024-09-09 LAB — TSH: TSH: 6.136 u[IU]/mL — ABNORMAL HIGH (ref 0.350–4.500)

## 2024-09-09 LAB — POC OCCULT BLOOD, ED: Fecal Occult Bld: POSITIVE — AB

## 2024-09-09 LAB — C-REACTIVE PROTEIN: CRP: 9.9 mg/dL — ABNORMAL HIGH (ref <1.0)

## 2024-09-09 MED ORDER — PYRIDOSTIGMINE BROMIDE 60 MG PO TABS
30.0000 mg | ORAL_TABLET | Freq: Three times a day (TID) | ORAL | Status: DC
  Administered 2024-09-09 – 2024-09-11 (×5): 30 mg via ORAL
  Filled 2024-09-09: qty 0.5
  Filled 2024-09-09: qty 1
  Filled 2024-09-09: qty 0.5
  Filled 2024-09-09 (×3): qty 1

## 2024-09-09 MED ORDER — VITAMIN B-12 1000 MCG PO TABS
1000.0000 ug | ORAL_TABLET | Freq: Every day | ORAL | Status: DC
  Administered 2024-09-10 – 2024-09-11 (×2): 1000 ug via ORAL
  Filled 2024-09-09 (×2): qty 1

## 2024-09-09 MED ORDER — IPRATROPIUM-ALBUTEROL 0.5-2.5 (3) MG/3ML IN SOLN
3.0000 mL | Freq: Once | RESPIRATORY_TRACT | Status: AC
  Administered 2024-09-09: 3 mL via RESPIRATORY_TRACT
  Filled 2024-09-09: qty 3

## 2024-09-09 MED ORDER — PANTOPRAZOLE SODIUM 40 MG PO TBEC
40.0000 mg | DELAYED_RELEASE_TABLET | Freq: Every day | ORAL | Status: DC
  Administered 2024-09-10: 40 mg via ORAL
  Filled 2024-09-09 (×2): qty 1

## 2024-09-09 MED ORDER — THIAMINE MONONITRATE 100 MG PO TABS
100.0000 mg | ORAL_TABLET | Freq: Every day | ORAL | Status: DC
  Administered 2024-09-10: 100 mg via ORAL
  Filled 2024-09-09 (×2): qty 1

## 2024-09-09 MED ORDER — ALBUTEROL SULFATE (2.5 MG/3ML) 0.083% IN NEBU
2.5000 mg | INHALATION_SOLUTION | Freq: Four times a day (QID) | RESPIRATORY_TRACT | Status: DC
  Administered 2024-09-09 – 2024-09-10 (×3): 2.5 mg via RESPIRATORY_TRACT
  Filled 2024-09-09 (×3): qty 3

## 2024-09-09 MED ORDER — DOCUSATE SODIUM 100 MG PO CAPS
100.0000 mg | ORAL_CAPSULE | Freq: Two times a day (BID) | ORAL | Status: DC
  Administered 2024-09-09 – 2024-09-10 (×3): 100 mg via ORAL
  Filled 2024-09-09 (×4): qty 1

## 2024-09-09 MED ORDER — FENOFIBRATE 54 MG PO TABS
54.0000 mg | ORAL_TABLET | Freq: Every day | ORAL | Status: DC
  Administered 2024-09-10: 54 mg via ORAL
  Filled 2024-09-09 (×2): qty 1

## 2024-09-09 MED ORDER — MIRTAZAPINE 15 MG PO TABS
15.0000 mg | ORAL_TABLET | Freq: Every day | ORAL | Status: DC
  Administered 2024-09-09 – 2024-09-13 (×5): 15 mg via ORAL
  Filled 2024-09-09 (×5): qty 1

## 2024-09-09 MED ORDER — SODIUM CHLORIDE 0.9 % IV BOLUS
1000.0000 mL | Freq: Once | INTRAVENOUS | Status: AC
  Administered 2024-09-09: 1000 mL via INTRAVENOUS

## 2024-09-09 MED ORDER — FESOTERODINE FUMARATE ER 4 MG PO TB24
4.0000 mg | ORAL_TABLET | Freq: Every day | ORAL | Status: DC
  Administered 2024-09-10: 4 mg via ORAL
  Filled 2024-09-09 (×2): qty 1

## 2024-09-09 MED ORDER — IPRATROPIUM-ALBUTEROL 0.5-2.5 (3) MG/3ML IN SOLN: 3.0000 mL | Freq: Once | RESPIRATORY_TRACT | Status: DC

## 2024-09-09 NOTE — ED Triage Notes (Signed)
 Pt to ED via EMS from [facility] rehab with c/o rectal bleeding. Pt noticed bright red blood after bowel movement this am. Pt denies pain, nausea. Pt A&Ox4.

## 2024-09-09 NOTE — ED Provider Notes (Addendum)
 [place] [facility] AT [facility] Provider Note  CSN: [id number] Arrival date & time: 09/09/24 1311  Chief Complaint(s) Rectal Bleeding  HPI Austin [name] is a 83 y.o. male history of asthma, COPD, interstitial lung disease, recent diagnosis and admission for possible myasthenia gravis presenting with rectal bleeding.  Patient reports that today was straining and had some blood in the toilet.  He also notes he feels somewhat short of breath and has had a cough.  History is somewhat limited as patient seems confused.  Denies any pain or discomfort.   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   Seasonal allergic rhinitis 08/31/2021   Vitamin B12 deficiency 02/10/2021   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 Authorizing Provider  albuterol  (PROVENTIL ) (2.5 MG/3ML) 0.083% nebulizer solution USE 1 VIAL BY NEBULIZATION EVERY 4 HOURS AS NEEDED FOR WHEEZING OR SHORTNESS OF BREATH/DYSPNEA. G54.001 07/01/23   [name], MD  albuterol  (VENTOLIN  HFA) 108 (90 Base) MCG/ACT inhaler INHALE 1-2 PUFFS BY MOUTH EVERY 6 HOURS AS NEEDED FOR WHEEZE OR SHORTNESS OF BREATH 03/26/24   Meade Verdon RAMAN, MD  AMBULATORY NON FORMULARY MEDICATION walker Dispense 1 Dx code: F74.447 Use as needed 07/17/24   Joane Artist RAMAN, MD  baclofen  (LIORESAL ) 10 MG tablet Take 0.5-1 tablets (5-10 mg total) by mouth 4 (four) times daily as needed for muscle spasms. 12/06/23   [name], MD  buPROPion  (WELLBUTRIN  XL) 300 MG 24 hr tablet Take 1 tablet (300 mg total) by mouth daily. 07/09/24   [name], MD  Cyanocobalamin  (VITAMIN B 12 PO) Take 1,000 Units by mouth.    [provider]  feeding supplement (ENSURE PLUS HIGH PROTEIN) LIQD Take 237 mLs by mouth 2 (two) times daily between meals. 09/06/24   Perri DELENA Meliton Mickey., MD  fenofibrate  micronized (LOFIBRA) 200 MG capsule TAKE 1 CAPSULE BY MOUTH EVERY DAY 04/20/24   [name], MD  fluticasone -salmeterol (ADVAIR  HFA) 45-21 MCG/ACT inhaler Inhale 2 puffs into the lungs 2 (two) times daily. 08/15/24   Meade Verdon RAMAN, MD  gabapentin  (NEURONTIN ) 100 MG capsule TAKE 1 CAPSULE BY MOUTH EVERYDAY AT BEDTIME 04/23/24   [name], MD  glucose blood (FREESTYLE LITE) test strip CHECK BLOOD SUGAR THREE  TIMES A DAY E11.9 09/09/21   [name], MD  ibuprofen  (ADVIL ) 800 MG tablet TAKE 1 TABLET BY MOUTH EVERY 8 HOURS AS NEEDED Patient taking differently: Take 800 mg by mouth every 8 (eight) hours as needed for mild pain (pain score 1-3). 09/09/20   [name], MD  Lancets Misc. GEORGANA 2 EXTRA) MISC Use to check blood sugar 3 times daily/ Dx E11.9 09/15/20    [name], MD  loratadine  (CLARITIN ) 10 MG tablet TAKE 1 TABLET BY MOUTH EVERY DAY 02/02/24   Desai, Nikita S, MD  magnesium oxide (MAG-OX) 400 (240 Mg) MG tablet TAKE 1 TABLET BY MOUTH EVERY DAY 06/01/24   [name], MD  mirtazapine  (REMERON ) 15 MG tablet Take 1 tablet (15 mg total) by mouth at bedtime. 09/05/24 09/05/25  Perri DELENA Meliton Mickey., MD  montelukast  (SINGULAIR ) 10 MG tablet TAKE 1 TABLET BY MOUTH EVERYDAY AT BEDTIME 06/01/24   Desai, Nikita S, MD  multivitamin (CENTRUM) chewable tablet Chew 1 tablet by mouth daily. 09/05/24   Perri DELENA Meliton Mickey., MD  nitrofurantoin, macrocrystal-monohydrate, (MACROBID) 100 MG capsule Take 100 mg by mouth daily. 09/24/21   [provider]  omeprazole  (PRILOSEC) 20 MG capsule TAKE 2 CAPSULES BY MOUTH EVERY DAY 03/26/24   Meade Verdon RAMAN, MD  pyridostigmine  (MESTINON ) 60 MG tablet Take 0.5 tablets (30 mg total) by mouth every 8 (eight) hours. 09/05/24   Perri DELENA Meliton Mickey., MD  solifenacin (VESICARE) 5 MG tablet Take 5 mg by mouth daily.    [provider]  Spacer/Aero-Holding Chambers DEVI 1 each by Does not apply route as directed. 06/29/24   Meade Verdon RAMAN, MD  thiamine  (VITAMIN B1) 100 MG tablet Take 1 tablet (100 mg total) by mouth daily. 09/05/24   Perri DELENA Meliton Mickey., MD  Vitamin D , Cholecalciferol, 50 MCG (2000 UT) CAPS Take 1 capsule by mouth daily.    [provider]                                                                                                                                    Past Surgical History Past Surgical History:  Procedure Laterality Date   GALLBLADDER SURGERY  1997   REPLACEMENT TOTAL KNEE BILATERAL  2001   2007   surgery replacement Bilateral 2005   TOTAL SHOULDER REPLACEMENT Bilateral 2000   Family History Family History  Problem Relation Age of Onset   Diabetes Brother    Heart disease Paternal Grandfather    Asthma Neg Hx     Social History Social History    Tobacco Use   Smoking status: Former    Types: Cigars    Quit date: 10/22/2019    Years since quitting: 4.8   Smokeless tobacco: Never   Tobacco comments:    smoked occ. with son. did not tolerate it long. 12/14/19.  Vaping Use   Vaping status: Never  Used  Substance Use Topics   Alcohol  use: Yes   Drug use: Never   Allergies Patient has no known allergies.  Review of Systems Review of Systems  All other systems reviewed and are negative.   Physical Exam Vital Signs  I have reviewed the triage vital signs BP 120/75   Pulse (!) 106   Temp 98.3 F (36.8 C) (Axillary)   Resp (!) 22   Ht 5' 9 (1.753 m)   Wt 70 kg   SpO2 93%   BMI 22.79 kg/m  Physical Exam Vitals and nursing note reviewed.  Constitutional:      General: He is not in acute distress.    Appearance: Normal appearance. He is ill-appearing.  HENT:     Mouth/Throat:     Mouth: Mucous membranes are moist.  Eyes:     Conjunctiva/sclera: Conjunctivae normal.  Cardiovascular:     Rate and Rhythm: Normal rate and regular rhythm.  Pulmonary:     Effort: Tachypnea and accessory muscle usage present. No respiratory distress.     Breath sounds: Examination of the left-lower field reveals rales. Rales present.  Abdominal:     General: Abdomen is flat.     Palpations: Abdomen is soft.     Tenderness: There is no abdominal tenderness.  Musculoskeletal:     Right lower leg: No edema.     Left lower leg: No edema.  Skin:    General: Skin is warm and dry.     Capillary Refill: Capillary refill takes less than 2 seconds.  Neurological:     Mental Status: He is alert and oriented to person, place, and time. Mental status is at baseline.     Comments: Very soft and difficult to understand voice  Psychiatric:        Mood and Affect: Mood normal.        Behavior: Behavior normal.     ED Results and Treatments Labs (all labs ordered are listed, but only abnormal results are displayed) Labs Reviewed   COMPREHENSIVE METABOLIC PANEL WITH GFR - Abnormal; Notable for the following components:      Result Value   Glucose, Bld 122 (*)    Creatinine, Ser 0.51 (*)    Calcium  8.6 (*)    Albumin 2.4 (*)    AST 42 (*)    ALT 60 (*)    All other components within normal limits  CBC WITH DIFFERENTIAL/PLATELET - Abnormal; Notable for the following components:   RBC 4.03 (*)    Monocytes Absolute 1.3 (*)    All other components within normal limits  I-STAT CHEM 8, ED - Abnormal; Notable for the following components:   Creatinine, Ser 0.50 (*)    Glucose, Bld 123 (*)    All other components within normal limits  I-STAT CG4 LACTIC ACID, ED - Abnormal; Notable for the following components:   Lactic Acid, Venous 2.1 (*)    All other components within normal limits  POC OCCULT BLOOD, ED - Abnormal; Notable for the following components:   Fecal Occult Bld POSITIVE (*)    All other components within normal limits  I-STAT VENOUS BLOOD GAS, ED - Abnormal; Notable for the following components:   pH, Ven 7.438 (*)    pCO2, Ven 39.1 (*)    HCT 34.0 (*)    Hemoglobin 11.6 (*)    All other components within normal limits  RESP PANEL BY RT-PCR (RSV, FLU A&B, COVID)  RVPGX2  CULTURE, BLOOD (ROUTINE X  2)  CULTURE, BLOOD (ROUTINE X 2)  BRAIN NATRIURETIC PEPTIDE  URINALYSIS, W/ REFLEX TO CULTURE (INFECTION SUSPECTED)  C-REACTIVE PROTEIN  PROCALCITONIN  MAGNESIUM  C-REACTIVE PROTEIN  PROCALCITONIN  CBC WITH DIFFERENTIAL/PLATELET  BRAIN NATRIURETIC PEPTIDE  I-STAT CG4 LACTIC ACID, ED  TYPE AND SCREEN  ABO/RH                                                                                                                          Radiology DG Chest Portable 1 View Result Date: 09/09/2024 CLINICAL DATA:  Rectal bleeding. Bright red blood after bowel movement this morning. EXAM: PORTABLE CHEST 1 VIEW COMPARISON:  09/01/2024 and older exams.  CT, 09/05/2024. FINDINGS: Cardiac silhouette is normal in  size. No mediastinal or hilar masses. Elevated right hemidiaphragm. Bilateral areas lung scarring, stable. No evidence of pneumonia or pulmonary edema. No pleural effusion or pneumothorax. Skeletal structures are demineralized. Partly imaged left shoulder prosthesis. IMPRESSION: 1. No acute cardiopulmonary disease. 2. Chronic lung abnormalities stable from the prior exams. Electronically Signed   By: Alm Parkins M.D.   On: 09/09/2024 14:35    Pertinent labs & imaging results that were available during my care of the patient were reviewed by me and considered in my medical decision making (see MDM for details).  Medications Ordered in ED Medications  ipratropium-albuterol  (DUONEB) 0.5-2.5 (3) MG/3ML nebulizer solution 3 mL (has no administration in time range)  sodium chloride  0.9 % bolus 1,000 mL (0 mLs Intravenous Stopped 09/09/24 1515)  ipratropium-albuterol  (DUONEB) 0.5-2.5 (3) MG/3ML nebulizer solution 3 mL (3 mLs Nebulization Given 09/09/24 1520)                                                                                                                                     Procedures .Critical Care  Performed by: Francesca Elsie CROME, MD Authorized by: Francesca Elsie CROME, MD   Critical care provider statement:    Critical care time (minutes):  30   Critical care was necessary to treat or prevent imminent or life-threatening deterioration of the following conditions:  Respiratory failure   Critical care was time spent personally by me on the following activities:  Development of treatment plan with patient or surrogate, discussions with consultants, evaluation of patient's response to treatment, examination of patient, ordering and review of laboratory studies, ordering and review of radiographic studies, ordering and performing treatments and interventions,  pulse oximetry, re-evaluation of patient's condition and review of old charts   Care discussed with: admitting provider      (including critical care time)  Medical Decision Making / ED Course   MDM:  83 year old presenting to the emergency department shortness of breath.  Patient is ill-appearing, appears tachypneic.  Exam with some basilar rales on the left.  Patient was just admitted for myasthenic crisis.  He appears to be having shortness of breath and has a very soft voice.  Concern for possible recurrent crisis.  Will check NIF.  Examination also concerning for possible pneumonia given crackles, cough.  Will check chest x-ray, check labs including blood cultures.  Will give fluids.  He is also complaining of an episode of rectal bleeding.  He is not on any blood thinner.  Will check rectal exam and check type and screen.  Clinical Course as of 09/09/24 1715  Sun Sep 09, 2024  1431 NIF is -73, discussed with neurology Dr. Nichola, reports he is familiar with patient and he has been around that level on recent admit, will come see pt. Family present, report breathing seems similar to previous, maybe slightly worse.  [WS]  1620 Repeat lactate reassuring.  Discussed with Dr. Nichola evaluated patient, feels clinical presentation is similar to previous.  He is still does have some evidence of rectal bleeding.  Will give breathing treatment.  Chest x-ray is without evidence of acute infiltrate.  Suspect crackles are more likely from his chronic interstitial lung disease. [WS]  1712 Patient feels breathing did improve after receiving DuoNeb.  Will give additional breathing treatment.  Message Dr. Avram with GI, GI will consult to see patient tomorrow for rectal bleeding.  Signed out to hospitalist to admit the patient. [WS]    Clinical Course User Index [WS] Francesca Elsie CROME, MD     Additional history obtained: -Additional history obtained from family, ems, and spouse -External records from outside source obtained and reviewed including: Chart review including previous notes, labs, imaging, consultation  notes including prior notes    Lab Tests: -I ordered, reviewed, and interpreted labs.   The pertinent results include:   Labs Reviewed  COMPREHENSIVE METABOLIC PANEL WITH GFR - Abnormal; Notable for the following components:      Result Value   Glucose, Bld 122 (*)    Creatinine, Ser 0.51 (*)    Calcium  8.6 (*)    Albumin 2.4 (*)    AST 42 (*)    ALT 60 (*)    All other components within normal limits  CBC WITH DIFFERENTIAL/PLATELET - Abnormal; Notable for the following components:   RBC 4.03 (*)    Monocytes Absolute 1.3 (*)    All other components within normal limits  I-STAT CHEM 8, ED - Abnormal; Notable for the following components:   Creatinine, Ser 0.50 (*)    Glucose, Bld 123 (*)    All other components within normal limits  I-STAT CG4 LACTIC ACID, ED - Abnormal; Notable for the following components:   Lactic Acid, Venous 2.1 (*)    All other components within normal limits  POC OCCULT BLOOD, ED - Abnormal; Notable for the following components:   Fecal Occult Bld POSITIVE (*)    All other components within normal limits  I-STAT VENOUS BLOOD GAS, ED - Abnormal; Notable for the following components:   pH, Ven 7.438 (*)    pCO2, Ven 39.1 (*)    HCT 34.0 (*)    Hemoglobin 11.6 (*)  All other components within normal limits  RESP PANEL BY RT-PCR (RSV, FLU A&B, COVID)  RVPGX2  CULTURE, BLOOD (ROUTINE X 2)  CULTURE, BLOOD (ROUTINE X 2)  BRAIN NATRIURETIC PEPTIDE  URINALYSIS, W/ REFLEX TO CULTURE (INFECTION SUSPECTED)  C-REACTIVE PROTEIN  PROCALCITONIN  MAGNESIUM  C-REACTIVE PROTEIN  PROCALCITONIN  CBC WITH DIFFERENTIAL/PLATELET  BRAIN NATRIURETIC PEPTIDE  I-STAT CG4 LACTIC ACID, ED  TYPE AND SCREEN  ABO/RH    Notable for mild lactic acidosis without signs of infection, + FOBT  EKG   EKG Interpretation Date/Time:  Sunday September 09 2024 13:47:11 EST Ventricular Rate:  110 PR Interval:  178 QRS Duration:  124 QT Interval:  344 QTC Calculation: 466 R  Axis:   -41  Text Interpretation: Ectopic atrial tachycardia, unifocal RBBB and LAFB Inferior infarct, acute Confirmed by Francesca Fallow ([id number]) on 09/09/2024 2:06:25 PM         Imaging Studies ordered: I ordered imaging studies including CXR On my interpretation imaging demonstrates ILD w/o acute changes  I independently visualized and interpreted imaging. I agree with the radiologist interpretation   Medicines ordered and prescription drug management: Meds ordered this encounter  Medications   sodium chloride  0.9 % bolus 1,000 mL   ipratropium-albuterol  (DUONEB) 0.5-2.5 (3) MG/3ML nebulizer solution 3 mL   ipratropium-albuterol  (DUONEB) 0.5-2.5 (3) MG/3ML nebulizer solution 3 mL    -I have reviewed the patients home medicines and have made adjustments as needed   Consultations Obtained: I requested consultation with the neurologist,  and discussed lab and imaging findings as well as pertinent plan - they recommend: continue to monitor    Cardiac Monitoring: The patient was maintained on a cardiac monitor.  I personally viewed and interpreted the cardiac monitored which showed an underlying rhythm of: sinus tachycardia   Reevaluation: After the interventions noted above, I reevaluated the patient and found that their symptoms have improved  Co morbidities that complicate the patient evaluation  Past Medical History:  Diagnosis Date   Asthma    Depression    Hypertension    Prostate cancer (HCC)    Urinary incontinence       Dispostion: Disposition decision including need for hospitalization was considered, and patient admitted to the hospital.    Final Clinical Impression(s) / ED Diagnoses Final diagnoses:  Lower GI bleeding  Dyspnea, unspecified type     This chart was dictated using voice recognition software.  Despite best efforts to proofread,  errors can occur which can change the documentation meaning.    Francesca Fallow CROME, MD 09/09/24  1714    Francesca Fallow CROME, MD 09/09/24 [phone]

## 2024-09-09 NOTE — H&P (Signed)
 [facility] H&P   Patient Demographics:    Austin [name], is a 83 y.o. male  MRN: [id number]   DOB - 05/22/1941  Admit Date - 09/09/2024  Outpatient Primary MD for the patient is [name], MD    Patient coming from: SNF  Chief Complaint  Patient presents with   Rectal Bleeding      HPI:    Austin [name]  is a 84 y.o. 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  He was seen by neurology, his myasthenia gravis workup is still underway, he was placed on Mestinon  and discharged to SNF few days ago  Patient now presents from SNF after another fall while getting out of the chair, he did not hurt himself, SNF staff also noted that earlier this afternoon patient had a bowel movement mixed with some bright red blood, of note he has history of hemorrhoids as well, this happened only once, patient had no abdominal pain or cramping, no nausea or vomiting.  Patient otherwise was feeling well he was sent to the ER for further evaluation.  Currently he is relatively symptom-free, wife is bedside, he denies any fever or chills, no headache, no neck pain or stiffness, no focal weakness, no chest or  abdominal pain, no dysuria.  Besides mild weakness and mild confusion he feels okay.  Of note patient had hospital-acquired delirium during recent admission as well.  In the ER patient was found to have some bright red blood in his rectum during the exam by the ER physician, he was found to be slightly tachypneic for which neurology was consulted, neurology cleared him from myasthenia gravis exacerbation standpoint, GI was consulted for possible ongoing lower GI bleed and I was requested to admit the patient    Review of systems:    A full 10 point Review of Systems was done, except as  stated above, all other Review of Systems were negative.   With Past History of the following :    Past Medical History:  Diagnosis Date   Asthma    Depression    Hypertension    Prostate cancer [facility])    Urinary incontinence       Past Surgical History:  Procedure Laterality Date   GALLBLADDER SURGERY  1997   REPLACEMENT TOTAL KNEE BILATERAL  2001   2007   surgery replacement Bilateral 2005   TOTAL SHOULDER REPLACEMENT Bilateral 2000      Social History:     Social History   Tobacco Use   Smoking status: Former    Types: Cigars    Quit date: 10/22/2019    Years since quitting: 4.8   Smokeless tobacco: Never   Tobacco comments:    smoked occ. with son. did not tolerate it long. 12/14/19.  Substance Use Topics   Alcohol use: Yes       Family History :     Family History  Problem Relation Age of Onset   Diabetes Brother    Heart disease Paternal Grandfather    Asthma Neg Hx      Home Medications:   Prior to Admission medications   Medication Sig Start Date End Date Taking? Authorizing Provider  albuterol  (PROVENTIL ) (2.5 MG/3ML) 0.083% nebulizer solution USE 1 VIAL BY NEBULIZATION EVERY 4 HOURS AS NEEDED FOR WHEEZING OR SHORTNESS OF BREATH/DYSPNEA. G54.001 07/01/23   [name], MD  albuterol  (VENTOLIN  HFA) 108 (90 Base) MCG/ACT inhaler INHALE 1-2 PUFFS BY MOUTH EVERY 6  HOURS AS NEEDED FOR WHEEZE OR SHORTNESS OF BREATH 03/26/24   Meade Verdon RAMAN, MD  AMBULATORY NON FORMULARY MEDICATION walker Dispense 1 Dx code: F74.447 Use as needed 07/17/24   Corey, Evan S, MD  baclofen  (LIORESAL ) 10 MG tablet Take 0.5-1 tablets (5-10 mg total) by mouth 4 (four) times daily as needed for muscle spasms. 12/06/23   [name], MD  buPROPion  (WELLBUTRIN  XL) 300 MG 24 hr tablet Take 1 tablet (300 mg total) by mouth daily. 07/09/24   [name], MD  Cyanocobalamin  (VITAMIN B 12 PO) Take 1,000 Units by mouth.    [provider]  feeding supplement (ENSURE PLUS HIGH PROTEIN) LIQD Take 237 mLs by mouth 2 (two) times daily between meals. 09/06/24   Perri DELENA Meliton Mickey., MD  fenofibrate  micronized (LOFIBRA) 200 MG capsule TAKE 1 CAPSULE BY MOUTH EVERY DAY 04/20/24   [name], MD  fluticasone -salmeterol (ADVAIR  HFA) 45-21 MCG/ACT inhaler Inhale 2 puffs into the lungs 2 (two) times daily. 08/15/24   Meade Verdon RAMAN, MD  gabapentin  (NEURONTIN ) 100 MG capsule TAKE 1 CAPSULE BY MOUTH EVERYDAY AT BEDTIME 04/23/24   [name], MD  glucose blood (FREESTYLE LITE) test strip CHECK BLOOD SUGAR THREE TIMES A DAY E11.9 09/09/21   [name], MD  ibuprofen  (ADVIL ) 800 MG tablet TAKE 1 TABLET BY MOUTH EVERY 8 HOURS AS NEEDED Patient taking differently: Take 800 mg by mouth every 8 (eight) hours as needed for mild pain (pain score 1-3). 09/09/20   [name], MD  Lancets Misc. GEORGANA 2 EXTRA) MISC Use to check blood sugar 3 times daily/ Dx E11.9 09/15/20   [name], MD  loratadine  (CLARITIN ) 10 MG tablet TAKE 1 TABLET BY MOUTH EVERY DAY 02/02/24   Desai, Nikita S, MD  magnesium oxide (MAG-OX) 400 (240 Mg) MG tablet TAKE 1 TABLET BY MOUTH EVERY  DAY 06/01/24   [name], MD  mirtazapine  (REMERON ) 15 MG tablet Take 1 tablet (15 mg total) by mouth at bedtime. 09/05/24 09/05/25  Perri DELENA Meliton Mickey., MD  montelukast  (SINGULAIR ) 10 MG tablet TAKE 1 TABLET BY  MOUTH EVERYDAY AT BEDTIME 06/01/24   Desai, Nikita S, MD  multivitamin (CENTRUM) chewable tablet Chew 1 tablet by mouth daily. 09/05/24   Perri DELENA Meliton Mickey., MD  nitrofurantoin, macrocrystal-monohydrate, (MACROBID) 100 MG capsule Take 100 mg by mouth daily. 09/24/21   [provider]  omeprazole  (PRILOSEC) 20 MG capsule TAKE 2 CAPSULES BY MOUTH EVERY DAY 03/26/24   Desai, Nikita S, MD  pyridostigmine  (MESTINON ) 60 MG tablet Take 0.5 tablets (30 mg total) by mouth every 8 (eight) hours. 09/05/24   Perri DELENA Meliton Mickey., MD  solifenacin (VESICARE) 5 MG tablet Take 5 mg by mouth daily.    [provider]  Spacer/Aero-Holding Chambers DEVI 1 each by Does not apply route as directed. 06/29/24   Meade Verdon RAMAN, MD  thiamine  (VITAMIN B1) 100 MG tablet Take 1 tablet (100 mg total) by mouth daily. 09/05/24   Perri DELENA Meliton Mickey., MD  Vitamin D , Cholecalciferol, 50 MCG (2000 UT) CAPS Take 1 capsule by mouth daily.    [provider]     Allergies:    No Known Allergies   Physical Exam:   Vitals  Blood pressure 120/75, pulse (!) 106, temperature 98.4 F (36.9 C), temperature source Oral, resp. rate (!) 22, height 5' 9 (1.753 m), weight 70 kg, SpO2 93%.   1. General -elderly Caucasian male lying in hospital bed in no discomfort  2. Normal affect and insight, Not Suicidal or Homicidal, Awake - mildly confused, O x 2  3. No F.N deficits, ALL C.Nerves Intact, Strength 5/5 all 4 extremities, Sensation intact all 4 extremities, Plantars down going.  4. Ears and Eyes appear Normal, Conjunctivae clear, PERRLA. Moist Oral Mucosa.  5. Supple Neck, No JVD, No cervical lymphadenopathy appriciated, No Carotid Bruits.  6. Symmetrical Chest wall movement, Good air movement bilaterally, CTAB.  7. RRR, No Gallops, Rubs or Murmurs, No Parasternal Heave.  8. Positive Bowel Sounds, Abdomen Soft, No tenderness, No organomegaly appriciated,No rebound -guarding or rigidity.  9.   No Cyanosis, Normal Skin Turgor, No Skin Rash or Bruise.  10. Good muscle tone,  joints appear normal , no effusions, Normal ROM.  11. No Palpable Lymph Nodes in Neck or Axillae    Data Review:   Recent Labs  Lab 09/03/24 0354 09/09/24 1338 09/09/24 1406 09/09/24 1640  WBC 6.9 8.2  --   --   HGB 12.5* 13.1 13.6 11.6*  HCT 36.7* 40.1 40.0 34.0*  PLT 203 278  --   --   MCV 96.1 99.5  --   --   MCH 32.7 32.5  --   --   MCHC 34.1 32.7  --   --   RDW 14.1 14.3  --   --   LYMPHSABS  --  0.9  --   --   MONOABS  --  1.3*  --   --   EOSABS  --  0.1  --   --   BASOSABS  --  0.0  --   --     Recent Labs  Lab 09/03/24 0354 09/04/24 2201 09/05/24 0429 09/09/24 1338 09/09/24 1406 09/09/24 1407 09/09/24 1526 09/09/24 1640  NA 140  --   --  136 143  --   --  142  K 3.7  --   --  3.9 4.0  --   --  3.9  CL 105  --   --  102 101  --   --   --   CO2 26  --   --  25  --   --   --   --   ANIONGAP 9  --   --  9  --   --   --   --   GLUCOSE 117*  --   --  122* 123*  --   --   --   BUN 15  --   --  13 15  --   --   --   CREATININE 0.61  --  0.48* 0.51* 0.50*  --   --   --   AST  --   --   --  42*  --   --   --   --   ALT  --   --   --  60*  --   --   --   --   ALKPHOS  --   --   --  66  --   --   --   --   BILITOT  --   --   --  0.6  --   --   --   --   ALBUMIN  --   --   --  2.4*  --   --   --   --   DDIMER  --  2.16*  --   --   --   --   --   --   LATICACIDVEN  --   --   --   --   --  2.1* 1.8  --   BNP  --  28.0  --  41.4  --   --   --   --   CALCIUM  8.9  --   --  8.6*  --   --   --   --     Lab Results  Component Value Date   CHOL 137 07/09/2024   HDL 51.40 07/09/2024   LDLCALC 73 07/09/2024   TRIG 64.0 07/09/2024   CHOLHDL 3 07/09/2024    Recent Labs  Lab 09/03/24 0354 09/04/24 2201 09/09/24 1338 09/09/24 1407 09/09/24 1526  DDIMER  --  2.16*  --   --   --   LATICACIDVEN  --   --   --  2.1* 1.8  BNP  --  28.0 41.4  --   --   CALCIUM  8.9  --  8.6*  --   --      Recent Labs  Lab 09/03/24 0354 09/04/24 2201 09/05/24 0429 09/09/24 1338 09/09/24 1406 09/09/24 1407 09/09/24 1526  WBC 6.9  --   --  8.2  --   --   --   PLT 203  --   --  278  --   --   --   DDIMER  --  2.16*  --   --   --   --   --   LATICACIDVEN  --   --   --   --   --  2.1* 1.8  CREATININE 0.61  --  0.48* 0.51* 0.50*  --   --     Urinalysis    Component Value Date/Time   COLORURINE YELLOW 08/29/2024 1027   APPEARANCEUR CLOUDY (A) 08/29/2024 1027   LABSPEC 1.010 08/29/2024 1027  PHURINE 8.0 08/29/2024 1027   GLUCOSEU NEGATIVE 08/29/2024 1027   HGBUR MODERATE (A) 08/29/2024 1027   BILIRUBINUR NEGATIVE 08/29/2024 1027   BILIRUBINUR Negative 07/23/2022 1435   KETONESUR NEGATIVE 08/29/2024 1027   PROTEINUR NEGATIVE 08/29/2024 1027   UROBILINOGEN 0.2 07/23/2022 1435   NITRITE NEGATIVE 08/29/2024 1027   LEUKOCYTESUR NEGATIVE 08/29/2024 1027      Imaging Results:    DG Chest Portable 1 View Result Date: 09/09/2024 CLINICAL DATA:  Rectal bleeding. Bright red blood after bowel movement this morning. EXAM: PORTABLE CHEST 1 VIEW COMPARISON:  09/01/2024 and older exams.  CT, 09/05/2024. FINDINGS: Cardiac silhouette is normal in size. No mediastinal or hilar masses. Elevated right hemidiaphragm. Bilateral areas lung scarring, stable. No evidence of pneumonia or pulmonary edema. No pleural effusion or pneumothorax. Skeletal structures are demineralized. Partly imaged left shoulder prosthesis. IMPRESSION: 1. No acute cardiopulmonary disease. 2. Chronic lung abnormalities stable from the prior exams. Electronically Signed   By: Alm Parkins M.D.   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   Assessment & Plan:    1.  Recurrent falls.  Ongoing for several weeks.  Recently diagnosed with ataxia, orthostatic hypotension and possible myasthenia gravis.  This fall appears to  be due to poor balance while he was getting out of the chair, he did not hit his head or hurt himself, no headache or focal deficits.  Monitor orthostatic hypotension, monitor on telemetry, PT eval, continue myasthenia medications and monitor  2.  Recent possible diagnosis of myasthenia gravis.  Mestinon  continued, musk antibody panel still pending, postdischarge follow-up with neurology outpatient.  Seen by neurology here.  Monitor NIF every 12.  Supplemental 2 L oxygen for now, completely symptom-free at this point.  Stable VBG.  3.  Lower GI bleed.  Acute, this sounds hemorrhoidal, BUN and H&H stable, on PPI which will be continued, soft diet, monitor CBC, type screen, check INR, agreeable for transfusion if needed, [facility] already called.  4.  History of recently diagnosed COPD and ILD.  Mild shortness of breath, 2 L supplemental oxygen for comfort and monitor.  Postdischarge follow-up with pulmonary outpatient.  As needed nebulizer treatments.  5.  History of sinus tachycardia moderate AAS.  Recent echocardiogram few months ago showed a preserved EF of 65%, mild LVH, moderate AAS.  Outpatient follow-up with cardiology was recommended which will be done.  Telemetry monitor.  Low-dose beta-blocker if develops resistant tachycardia, currently stable.  Check TSH and free T4.  6.  History of hospital-acquired delirium and possible age-related cognitive decline -at risk for delirium again, already mildly confused, wife and patient counseled.  Minimize narcotics and benzodiazepines and monitor  7.  Dyslipidemia.  On Tricor .  8.  GERD.  On PPI.  9.  History of prostate cancer.  No acute issue.    DVT Prophylaxis  SCDs  AM Labs Ordered, also please review Full Orders  Family Communication: Admission, patients condition and plan of care including tests being ordered have been discussed with the patient and wife who indicate understanding and agree with the plan and Code Status.  Code Status  DNR  Likely DC to  SNF  Condition Fair  Consults called: [facility], Neuro    Admission status: Inpt    Time spent in minutes : 35  Signature  -    Lavada Stank M.D on 09/09/2024 at 5:52 PM   -  To page go to [email or web address]

## 2024-09-09 NOTE — Progress Notes (Signed)
 NIF performed x 3 with decent pt effort.  -20 NIF

## 2024-09-09 NOTE — Progress Notes (Signed)
 NIF performed per MD. Pt had good pt effort.  NIF -22.

## 2024-09-09 NOTE — Consult Note (Addendum)
 Date of service: September 09, 2024 Patient Name: Austin [name] MRN:  [id number] DOB:  Sep 27, 1941  Interval Hx/subjective    This is a patient with possible myasthenia gravis however all his antibodies were negative.  He was treated with IVIG for 5 days and discharged on 11/27.  He returned due to rectal bleeding.  As far as his myasthenia  symptoms he and his wife feels about the same.  His nifs was -20.  However his nifs have been about that range from -10 to -40 usually due to poor effort and cognition.  Do not feel this is a myasthenia exacerbation.  Recommend treating his rectal bleeding.  Vitals   Vitals:   09/09/24 1313 09/09/24 1315 09/09/24 1400  BP:  117/75 122/77  Pulse:  (!) 106 (!) 109  Resp:  20 (!) 30  Temp:  98.3 F (36.8 C)   TempSrc:  Axillary   SpO2:  97% 94%  Weight: 70 kg    Height: 5' 9 (1.753 m)       Body mass index is 22.79 kg/m.  Physical Exam  Gen: NAD. Shivering because he is cold  Neuro: Mental Status: Patient is awake, alert, oriented to person, place, month, year, and situation. Cranial Nerves: II: Visual Fields are full. Pupils are equal, round, and reactive to light.   III,IV, VI: EOMI without ptosis. No diplopia. VII: Facial movement is symmetric.  VIII: hearing is intact to voice X: Uvula elevates symmetrically XI: Shoulder shrug is symmetric. XII: tongue is midline without atrophy or fasciculations.  Buccal strength normal.  Tongue strength 4/5. Dysarthria present.  Motor: Tone is normal. Bulk is reduced.  RUE/RLE: 5/5 proximal/shoulder, 5/5 distal/wrist RLE/LLE: 5-/5 proximal/hip, 5/5 knee, 5/5 ankle Sensory: Sensation is symmetric to light touch in the arms and legs. Cerebellar: No ataxia.  Medications  Current Facility-Administered Medications:    ipratropium-albuterol  (DUONEB) 0.5-2.5 (3) MG/3ML nebulizer solution 3 mL, 3 mL, Nebulization, Once, Scheving, Elsie CROME, MD  Current Outpatient Medications:     albuterol  (PROVENTIL ) (2.5 MG/3ML) 0.083% nebulizer solution, USE 1 VIAL BY NEBULIZATION EVERY 4 HOURS AS NEEDED FOR WHEEZING OR SHORTNESS OF BREATH/DYSPNEA. J45.998, Disp: 375 mL, Rfl: 1   albuterol  (VENTOLIN  HFA) 108 (90 Base) MCG/ACT inhaler, INHALE 1-2 PUFFS BY MOUTH EVERY 6 HOURS AS NEEDED FOR WHEEZE OR SHORTNESS OF BREATH, Disp: 18 each, Rfl: 5   AMBULATORY NON FORMULARY MEDICATION, walker Dispense 1 Dx code: M25.552 Use as needed, Disp: 1 Device, Rfl: 0   baclofen  (LIORESAL ) 10 MG tablet, Take 0.5-1 tablets (5-10 mg total) by mouth 4 (four) times daily as needed for muscle spasms., Disp: 30 each, Rfl: 0   buPROPion  (WELLBUTRIN  XL) 300 MG 24 hr tablet, Take 1 tablet (300 mg total) by mouth daily., Disp: 90 tablet, Rfl: 3   Cyanocobalamin  (VITAMIN B 12 PO), Take 1,000 Units by mouth., Disp: , Rfl:    feeding supplement (ENSURE PLUS HIGH PROTEIN) LIQD, Take 237 mLs by mouth 2 (two) times daily between meals., Disp: , Rfl:    fenofibrate  micronized (LOFIBRA) 200 MG capsule, TAKE 1 CAPSULE BY MOUTH EVERY DAY, Disp: 90 capsule, Rfl: 0   fluticasone -salmeterol (ADVAIR  HFA) 45-21 MCG/ACT inhaler, Inhale 2 puffs into the lungs 2 (two) times daily., Disp: 12 g, Rfl: 11   gabapentin  (NEURONTIN ) 100 MG capsule, TAKE 1 CAPSULE BY MOUTH EVERYDAY AT BEDTIME, Disp: 30 capsule, Rfl: 0   glucose blood (FREESTYLE LITE) test strip, CHECK BLOOD SUGAR THREE TIMES A DAY E11.9, Disp: 100 strip,  Rfl: 12   ibuprofen  (ADVIL ) 800 MG tablet, TAKE 1 TABLET BY MOUTH EVERY 8 HOURS AS NEEDED (Patient taking differently: Take 800 mg by mouth every 8 (eight) hours as needed for mild pain (pain score 1-3).), Disp: 30 tablet, Rfl: 0   Lancets Misc. (UNISTIK 2 EXTRA) MISC, Use to check blood sugar 3 times daily/ Dx E11.9, Disp: 200 each, Rfl: 0   loratadine  (CLARITIN ) 10 MG tablet, TAKE 1 TABLET BY MOUTH EVERY DAY, Disp: 90 tablet, Rfl: 3   magnesium oxide (MAG-OX) 400 (240 Mg) MG tablet, TAKE 1 TABLET BY MOUTH EVERY DAY, Disp: 90  tablet, Rfl: 3   mirtazapine  (REMERON ) 15 MG tablet, Take 1 tablet (15 mg total) by mouth at bedtime., Disp: , Rfl:    montelukast  (SINGULAIR ) 10 MG tablet, TAKE 1 TABLET BY MOUTH EVERYDAY AT BEDTIME, Disp: 90 tablet, Rfl: 2   multivitamin (CENTRUM) chewable tablet, Chew 1 tablet by mouth daily., Disp: , Rfl:    nitrofurantoin, macrocrystal-monohydrate, (MACROBID) 100 MG capsule, Take 100 mg by mouth daily., Disp: , Rfl:    omeprazole  (PRILOSEC) 20 MG capsule, TAKE 2 CAPSULES BY MOUTH EVERY DAY, Disp: 180 capsule, Rfl: 1   pyridostigmine  (MESTINON ) 60 MG tablet, Take 0.5 tablets (30 mg total) by mouth every 8 (eight) hours., Disp: , Rfl:    solifenacin (VESICARE) 5 MG tablet, Take 5 mg by mouth daily., Disp: , Rfl:    Spacer/Aero-Holding Chambers DEVI, 1 each by Does not apply route as directed., Disp: 1 each, Rfl: 0   thiamine  (VITAMIN B1) 100 MG tablet, Take 1 tablet (100 mg total) by mouth daily., Disp: , Rfl:    Vitamin D , Cholecalciferol, 50 MCG (2000 UT) CAPS, Take 1 capsule by mouth daily., Disp: , Rfl:   Labs and Diagnostic Imaging   CBC:  Recent Labs  Lab 09/03/24 0354 09/09/24 1338 09/09/24 1406  WBC 6.9 8.2  --   NEUTROABS  --  5.8  --   HGB 12.5* 13.1 13.6  HCT 36.7* 40.1 40.0  MCV 96.1 99.5  --   PLT 203 278  --     Basic Metabolic Panel:  Lab Results  Component Value Date   NA 143 09/09/2024   K 4.0 09/09/2024   CO2 25 09/09/2024   GLUCOSE 123 (H) 09/09/2024   BUN 15 09/09/2024   CREATININE 0.50 (L) 09/09/2024   CALCIUM  8.6 (L) 09/09/2024   GFRNONAA >60 09/09/2024   Lipid Panel:  Lab Results  Component Value Date   LDLCALC 73 07/09/2024   HgbA1c:  Lab Results  Component Value Date   HGBA1C 6.0 07/09/2024   Urine Drug Screen: No results found for: LABOPIA, COCAINSCRNUR, LABBENZ, AMPHETMU, THCU, LABBARB  Alcohol Level No results found for: ETH INR No results found for: INR APTT No results found for: APTT AED levels: No results found  for: PHENYTOIN, ZONISAMIDE, LAMOTRIGINE, LEVETIRACETA  Assessment   Cari Vandeberg is a 83 y.o. male    Expand All Collapse All  NEUROLOGY CONSULT FOLLOW UP NOTE    Date of service: September 05, 2024 Patient Name: Zachari Alberta MRN:  [id number] DOB:  [birth year]   Interval Hx/subjective    Sitting up in bed. Speech about the same.  No SOB.  Going to rehab tomorrow.  No change in exam. 5th session of IVIG hanging now.     Vitals          Vitals:    09/04/24 1602 09/04/24 1930 09/04/24 2330 09/05/24 0330  BP:  130/85 127/81 130/81 127/78  Pulse: (!) 108   (!) 103 98  Resp: (!) 22 20 18 16   Temp: 97.8 F (36.6 C) 98 F (36.7 C) 98.1 F (36.7 C) 98.3 F (36.8 C)  TempSrc: Oral Oral Oral Oral  SpO2: 95%     94%  Weight:          Height:              Body mass index is 22.79 kg/m.   Physical Exam  Sitting up in bed, stable. NAD.     Neurologic Examination    Neuro: Mental Status: Patient is awake, alert, oriented to person, place, month, year, and situation. Cranial Nerves: II: Visual Fields are full. Pupils are equal, round, and reactive to light.   III,IV, VI: EOMI without ptosis. No diplopia. VII: Facial movement is symmetric.  VIII: hearing is intact to voice X: Uvula elevates symmetrically XI: Shoulder shrug is symmetric. XII: tongue is midline without atrophy or fasciculations.  Buccal strength normal.  Tongue strength 4/5. Dysarthria present.  Motor: Tone is normal. Bulk is reduced.  RUE/RLE: 5/5 proximal/shoulder, 5/5 distal/wrist RLE/LLE: 5-/5 proximal/hip, 5/5 knee, 5/5 ankle Sensory: Sensation is symmetric to light touch in the arms and legs. Cerebellar: FNF and HKS are slow but intact bilaterally   Medications  Current Medications    Current Facility-Administered Medications:    buPROPion  (WELLBUTRIN  XL) 24 hr tablet 300 mg, 300 mg, Oral, Daily, Georgina Basket, MD, 300 mg at 09/04/24 1045   enoxaparin  (LOVENOX ) injection 40 mg, 40  mg, Subcutaneous, Q24H, Georgina Basket, MD, 40 mg at 09/04/24 1438   feeding supplement (ENSURE PLUS HIGH PROTEIN) liquid 237 mL, 237 mL, Oral, BID BM, Al-Sultani, Anmar, MD, 237 mL at 09/04/24 1450   fluticasone  furoate-vilanterol (BREO ELLIPTA ) 100-25 MCG/ACT 1 puff, 1 puff, Inhalation, Daily, Georgina Basket, MD, 1 puff at 09/05/24 0849   Immune Globulin  10% (PRIVIGEN ) IV infusion 30 g, 400 mg/kg, Intravenous, Q24 Hr x 5, Khaliqdina, Salman, MD, Last Rate: 0 mL/hr at 09/03/24 1329, 30 g at 09/04/24 0911   ipratropium-albuterol  (DUONEB) 0.5-2.5 (3) MG/3ML nebulizer solution 3 mL, 3 mL, Nebulization, Q6H PRN, Georgina Basket, MD   megestrol  (MEGACE ) tablet 160 mg, 160 mg, Oral, Daily, Al-Sultani, Anmar, MD, 160 mg at 09/04/24 1046   montelukast  (SINGULAIR ) tablet 10 mg, 10 mg, Oral, QHS, Al-Sultani, Anmar, MD, 10 mg at 09/04/24 2317   multivitamin with minerals tablet 1 tablet, 1 tablet, Oral, Daily, Al-Sultani, Anmar, MD, 1 tablet at 09/04/24 1045   pantoprazole  (PROTONIX ) EC tablet 80 mg, 80 mg, Oral, Daily, Al-Sultani, Anmar, MD, 80 mg at 09/04/24 1045   pyridostigmine  (MESTINON ) tablet 30 mg, 30 mg, Oral, Q8H, 30 mg at 09/05/24 0604 **OR** [DISCONTINUED] pyridostigmine  (MESTINON ) injection 1 mg, 1 mg, Intravenous, Q8H, Khaliqdina, Salman, MD, 1 mg at 09/02/24 0619     Labs and Diagnostic Imaging    CBC:  Last Labs      Recent Labs  Lab 09/02/24 0329 09/03/24 0354  WBC 8.2 6.9  HGB 11.8* 12.5*  HCT 35.0* 36.7*  MCV 96.2 96.1  PLT 202 203        Basic Metabolic Panel:  Recent Labs       Lab Results  Component Value Date    NA 140 09/03/2024    K 3.7 09/03/2024    CO2 26 09/03/2024    GLUCOSE 117 (H) 09/03/2024    BUN 15 09/03/2024    CREATININE 0.48 (L) 09/05/2024  CALCIUM  8.9 09/03/2024    GFRNONAA >60 09/05/2024      Lipid Panel:  Recent Labs       Lab Results  Component Value Date    LDLCALC 73 07/09/2024      HgbA1c:  Recent Labs       Lab Results   Component Value Date    HGBA1C 6.0 07/09/2024      Urine Drug Screen:  Labs (Brief)  No results found for: LABOPIA, COCAINSCRNUR, LABBENZ, AMPHETMU, THCU, LABBARB    Alcohol Level  Labs (Brief)  No results found for: ETH   INR  Recent Labs  No results found for: INR   APTT  Recent Labs  No results found for: APTT   AED levels:  Recent Labs  No results found for: PHENYTOIN, ZONISAMIDE, LAMOTRIGINE, LEVETIRACETA       MRI Brain No acute abnormalities.      Assessment    Jakyrie Totherow is a 83 y.o. male with hx of asthma, depression, HTN, prostrate cancer, urinary incontinence, who initially presented with a ground level fall in the setting of pneumonia.  Admitted for myasthenia gravis exacerbation after he was given 2mg  of IV pyridostigmine  with significant improvement in his dysarthria within 10 mins.  Completed 5 days IVIG last dose on 11/27  -AChR labs neg. -CT chest no thymoma .   Seen by me previously.  He appears to be stable and unchanged from last time I saw him for myasthenia standpoint.  He does have some increased work of breathing I wonder if this is separate from his myasthenia.  He does have poor effort with nifs in the past.  Recommendations  --  Continue Mestinon  PO 30mg  TID  -recommend ABG and not rely on NIFs. -F/U with neurology outpt.  May need immunosuppressive such as cellcept medication but they can determine that at later date.    - Would not really on NIFs as he has h/o poor effort. can always get ABG to assess for CO2 retention. Elective intubation for airway cmopromise if she has difficulty clearing her secretions. Oxygen saturation should not be used to make decision regarding intubation.  - Medications that may worsen or trigger MG exacerbation: Class IA antiarrhythmics, magnesium, flouroquinolones, macrolides, aminoglycosides, penicillamine, curare, interferon alpha, botox, quinine. Use with caution: calcium   channel blocker, beta blockers and statins.          Signed, Zeke CHRISTELLA Raddle, MD [facility]

## 2024-09-10 DIAGNOSIS — G7000 Myasthenia gravis without (acute) exacerbation: Secondary | ICD-10-CM

## 2024-09-10 DIAGNOSIS — K922 Gastrointestinal hemorrhage, unspecified: Secondary | ICD-10-CM | POA: Diagnosis not present

## 2024-09-10 DIAGNOSIS — Z515 Encounter for palliative care: Secondary | ICD-10-CM | POA: Diagnosis not present

## 2024-09-10 DIAGNOSIS — Z7189 Other specified counseling: Secondary | ICD-10-CM | POA: Diagnosis not present

## 2024-09-10 DIAGNOSIS — Z711 Person with feared health complaint in whom no diagnosis is made: Secondary | ICD-10-CM

## 2024-09-10 DIAGNOSIS — R0600 Dyspnea, unspecified: Secondary | ICD-10-CM | POA: Diagnosis not present

## 2024-09-10 DIAGNOSIS — K627 Radiation proctitis: Secondary | ICD-10-CM | POA: Diagnosis not present

## 2024-09-10 DIAGNOSIS — R531 Weakness: Secondary | ICD-10-CM

## 2024-09-10 LAB — COMPREHENSIVE METABOLIC PANEL WITH GFR
ALT: 45 U/L — ABNORMAL HIGH (ref 0–44)
AST: 28 U/L (ref 15–41)
Albumin: 2 g/dL — ABNORMAL LOW (ref 3.5–5.0)
Alkaline Phosphatase: 55 U/L (ref 38–126)
Anion gap: 5 (ref 5–15)
BUN: 8 mg/dL (ref 8–23)
CO2: 28 mmol/L (ref 22–32)
Calcium: 8.1 mg/dL — ABNORMAL LOW (ref 8.9–10.3)
Chloride: 105 mmol/L (ref 98–111)
Creatinine, Ser: 0.46 mg/dL — ABNORMAL LOW (ref 0.61–1.24)
GFR, Estimated: >60 mL/min (ref >60)
Glucose, Bld: 208 mg/dL — ABNORMAL HIGH (ref 70–99)
Potassium: 3.3 mmol/L — ABNORMAL LOW (ref 3.5–5.1)
Sodium: 138 mmol/L (ref 135–145)
Total Bilirubin: 0.4 mg/dL (ref 0.0–1.2)
Total Protein: 6.9 g/dL (ref 6.5–8.1)

## 2024-09-10 LAB — C-REACTIVE PROTEIN
CRP: 11.1 mg/dL — ABNORMAL HIGH (ref <1.0)
CRP: 12.2 mg/dL — ABNORMAL HIGH (ref <1.0)

## 2024-09-10 LAB — CBC WITH DIFFERENTIAL/PLATELET
Abs Immature Granulocytes: 0.02 K/uL (ref 0.00–0.07)
Basophils Absolute: 0 K/uL (ref 0.0–0.1)
Basophils Relative: 0 %
Eosinophils Absolute: 0.2 K/uL (ref 0.0–0.5)
Eosinophils Relative: 3 %
HCT: 34.3 % — ABNORMAL LOW (ref 39.0–52.0)
Hemoglobin: 11.2 g/dL — ABNORMAL LOW (ref 13.0–17.0)
Immature Granulocytes: 0 %
Lymphocytes Relative: 15 %
Lymphs Abs: 1.1 K/uL (ref 0.7–4.0)
MCH: 32.7 pg (ref 26.0–34.0)
MCHC: 32.7 g/dL (ref 30.0–36.0)
MCV: 100 fL (ref 80.0–100.0)
Monocytes Absolute: 1 K/uL (ref 0.1–1.0)
Monocytes Relative: 14 %
Neutro Abs: 4.9 K/uL (ref 1.7–7.7)
Neutrophils Relative %: 68 %
Platelets: 251 K/uL (ref 150–400)
RBC: 3.43 MIL/uL — ABNORMAL LOW (ref 4.22–5.81)
RDW: 14.5 % (ref 11.5–15.5)
WBC: 7.1 K/uL (ref 4.0–10.5)
nRBC: 0 % (ref 0.0–0.2)

## 2024-09-10 LAB — MRSA NEXT GEN BY PCR, NASAL: MRSA by PCR Next Gen: NOT DETECTED

## 2024-09-10 LAB — MAGNESIUM
Magnesium: 1.8 mg/dL (ref 1.7–2.4)
Magnesium: 1.8 mg/dL (ref 1.7–2.4)

## 2024-09-10 LAB — LACTIC ACID, PLASMA
Lactic Acid, Venous: 1.4 mmol/L (ref 0.5–1.9)
Lactic Acid, Venous: 1.5 mmol/L (ref 0.5–1.9)

## 2024-09-10 LAB — PROCALCITONIN
Procalcitonin: <0.1 ng/mL
Procalcitonin: <0.1 ng/mL

## 2024-09-10 LAB — BRAIN NATRIURETIC PEPTIDE: B Natriuretic Peptide: 53.7 pg/mL (ref 0.0–100.0)

## 2024-09-10 LAB — PHOSPHORUS: Phosphorus: 2.2 mg/dL — ABNORMAL LOW (ref 2.5–4.6)

## 2024-09-10 MED ORDER — FLEET ENEMA RE ENEM
2.0000 | ENEMA | Freq: Once | RECTAL | Status: AC
  Administered 2024-09-11: 2 via RECTAL
  Filled 2024-09-10 (×2): qty 2

## 2024-09-10 MED ORDER — POTASSIUM CHLORIDE CRYS ER 20 MEQ PO TBCR
40.0000 meq | EXTENDED_RELEASE_TABLET | Freq: Two times a day (BID) | ORAL | Status: AC
  Administered 2024-09-10: 40 meq via ORAL
  Filled 2024-09-10 (×2): qty 2

## 2024-09-10 MED ORDER — K PHOS MONO-SOD PHOS DI & MONO 155-852-130 MG PO TABS
500.0000 mg | ORAL_TABLET | Freq: Once | ORAL | Status: AC
  Administered 2024-09-10: 500 mg via ORAL
  Filled 2024-09-10: qty 2

## 2024-09-10 MED ORDER — ALBUTEROL SULFATE (2.5 MG/3ML) 0.083% IN NEBU: 2.5000 mg | INHALATION_SOLUTION | Freq: Four times a day (QID) | RESPIRATORY_TRACT | Status: DC | PRN

## 2024-09-10 NOTE — Care Management Obs Status (Signed)
 MEDICARE OBSERVATION STATUS NOTIFICATION   Patient Details  Name: Austin [name] MRN: [id number] Date of Birth: 1941-04-08   Medicare Observation Status Notification Given:  Yes    Waddell Barnie Rama, RN 09/10/2024, 3:50 PM

## 2024-09-10 NOTE — Consult Note (Signed)
 Consultation Note Date: 09/10/2024   Patient Name: Austin [name]  DOB: [birth year]  MRN: [id number]  Age / Sex: 83 y.o., male  PCP: [name], MD Referring Physician: Sherrill Alejandro Donovan, DO  Reason for Consultation: Establishing goals of care  HPI/Patient Profile: 83 y.o. 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  He was seen by neurology, his myasthenia gravis workup is still underway, he was placed on Mestinon  and discharged to SNF few days ago. He is now admitted on 09/09/2024 after another fall at SNF as well as acute lower GI bleed.   Neurology was reconsulted and they do no feed this is a MG exacerbation. GI was also consulted plan for sigmoidoscopy to further evaluate with possible endoscopic intervention if needed.   Clinical Assessment and Goals of Care: Received notification son/Peter called PMT phone indicating patient has been readmitted and requesting return call.   I have reviewed medical records including EPIC notes, labs, available advanced directives including HCPOA. Unable to receive report from primary RN.   Went to visit patient at bedside - he is resting in bed. No signs or non-verbal gestures of pain or discomfort noted. No respiratory distress, increased work of breathing, or secretions noted.   Met with son/Peter via phone  to discuss diagnosis, prognosis, GOC, EOL  wishes, disposition, and options.  We discussed a brief life review of the patient as well as functional and nutritional status. Patient has overall not been doing well since discharge last week.  We discussed patient's current illness and what it means in the larger context of patient's on-going co-morbidities. [name] has a clear understanding of patient's current acute medical situation. Natural disease trajectory and expectations at EOL were discussed. I attempted to elicit values and goals of care important to the patient. The difference between aggressive medical intervention and comfort care was considered in light of the patient's goals of care.   [name] wishes to discuss hospice options moving forward as it is felt patient will not find benefit in returning to rehab. We discussed inpatient hospice facility, LTC with hospice, and returning home with hospice. Patient's wife is not able to care for him by herself. Patient's insurance reviewed in context of discharge options with hospice to the best of my ability.   [name] would like to schedule family meet tomorrow to discuss and finalize goals. Meeting scheduled for 1p.  Discussed with family the importance of continued conversation with each other and the medical providers regarding overall plan of care and treatment options, ensuring decisions are within the context of the patient's values and GOCs.    Questions and concerns were addressed. The patient/family was encouraged to call with questions and/or concerns. PMT card was previously provided.   Primary Decision Maker: PATIENT with assistance from HCPOA/son/Peter    SUMMARY OF RECOMMENDATIONS   Continue current gentle medical treatment for now Continue DNR-Limited as previously documented Family meeting scheduled for tomorrow 12/2 at 1p for ongoing GOC. Patient/family considering hospice PMT will continue to follow and support holistically   Code Status/Advance Care  Planning: DNR  Palliative Prophylaxis:  Aspiration, Frequent Pain Assessment, and Turn Reposition  Additional Recommendations (Limitations, Scope, Preferences): Full Scope Treatment  Psycho-social/Spiritual:  Desire for further Chaplaincy support:no Created space and opportunity for patient and family to express thoughts and feelings regarding patient's current medical situation.  Emotional support and therapeutic listening provided.  Prognosis:  Unable to determine  Discharge Planning: To Be Determined      Primary Diagnoses: Present on Admission:  LGI bleed   I have reviewed the medical record, interviewed the patient and family, and examined the patient. The following aspects are pertinent.  Past Medical History:  Diagnosis Date   Asthma    Depression    Hypertension    Prostate cancer (HCC)    Urinary incontinence    Social History   Socioeconomic History   Marital status: Married    Spouse name: Not on file   Number of children: Not on file   Years of education: Not on file   Highest education level: 12th grade  Occupational History   Occupation: retired  Tobacco Use   Smoking status: Former    Types: Cigars    Quit date: 10/22/2019    Years since quitting: 4.8   Smokeless tobacco: Never   Tobacco comments:    smoked occ. with son. did not tolerate it long. 12/14/19.  Vaping Use   Vaping status: Never Used  Substance and Sexual Activity   Alcohol  use: Yes   Drug use: Never   Sexual activity: Not Currently  Other Topics Concern   Not on file  Social History Narrative   Not on file   Social Drivers of Health   Financial Resource Strain: Low Risk  (09/27/2023)   Overall Financial Resource Strain (CARDIA)    Difficulty of Paying Living Expenses: Not hard at all  Food Insecurity: No Food Insecurity (09/09/2024)   Hunger Vital Sign    Worried About Running Out of Food in the Last Year: Never true    Ran Out of Food in the Last Year: Never true   Transportation Needs: No Transportation Needs (09/09/2024)   PRAPARE - Administrator, Civil Service (Medical): No    Lack of Transportation (Non-Medical): No  Physical Activity: Insufficiently Active (09/27/2023)   Exercise Vital Sign    Days of Exercise per Week: 3 days    Minutes of Exercise per Session: 30 min  Stress: No Stress Concern Present (09/27/2023)   [employer] of Occupational Health - Occupational Stress Questionnaire    Feeling of Stress : Only a little  Social Connections: Moderately Isolated (09/09/2024)   Social Connection and Isolation Panel    Frequency of Communication with Friends and Family: More than three times a week    Frequency of Social Gatherings with Friends and Family: More than three times a week    Attends Religious Services: Never    Database Administrator or Organizations: No    Attends Engineer, Structural: Never    Marital Status: Married   Family History  Problem Relation Age of Onset   Diabetes Brother    Heart disease Paternal Grandfather    Asthma Neg Hx    Scheduled Meds:  cyanocobalamin   1,000 mcg Oral Q0600   docusate sodium   100 mg Oral BID   fenofibrate   54 mg Oral Daily   fesoterodine   4 mg Oral Daily   ipratropium-albuterol   3 mL Nebulization Once   mirtazapine   15 mg Oral QHS   pantoprazole   40 mg Oral  Daily   pyridostigmine   30 mg Oral Q8H   thiamine   100 mg Oral Daily   Continuous Infusions: PRN Meds:.albuterol  Medications Prior to Admission:  Prior to Admission medications   Medication Sig Start Date End Date Taking? Authorizing Provider  buPROPion  (WELLBUTRIN  XL) 300 MG 24 hr tablet Take 1 tablet (300 mg total) by mouth daily. 07/09/24  Yes [name], MD  cyanocobalamin  (VITAMIN B12) 1000 MCG tablet Take 1,000 mcg by mouth daily.   Yes [provider]  Ensure (ENSURE) Take 237 mLs by mouth See admin instructions. Drink 237 ml's by mouth at 9 AM and 5 PM   Yes [provider]  fenofibrate  micronized (LOFIBRA) 200 MG capsule TAKE 1 CAPSULE BY MOUTH EVERY DAY Patient taking differently: Take 200 mg by mouth daily before breakfast. 04/20/24  Yes [name], MD  fluticasone -salmeterol (ADVAIR  HFA) 45-21 MCG/ACT inhaler Inhale 2 puffs into the lungs 2 (two) times daily. 08/15/24  Yes Meade Verdon RAMAN, MD  gabapentin  (NEURONTIN ) 100 MG capsule TAKE 1 CAPSULE BY MOUTH EVERYDAY AT BEDTIME Patient taking differently: Take 100 mg by mouth at bedtime. 04/23/24  Yes [name], MD  loratadine  (CLARITIN ) 10 MG tablet TAKE 1 TABLET BY MOUTH EVERY DAY 02/02/24  Yes Desai, Nikita S, MD  magnesium oxide (MAG-OX) 400 (240 Mg) MG tablet TAKE 1 TABLET BY MOUTH EVERY DAY Patient taking differently: Take 400 mg by mouth daily. 06/01/24  Yes [name], MD  mirtazapine  (REMERON ) 15 MG tablet Take 1 tablet (15 mg total) by mouth at bedtime. 09/05/24 09/05/25 Yes Perri DELENA Meliton Mickey., MD  montelukast  (SINGULAIR ) 10 MG tablet TAKE 1 TABLET BY MOUTH EVERYDAY AT BEDTIME Patient taking differently: Take 10 mg by mouth at bedtime. 06/01/24  Yes Desai, Nikita S, MD  nitrofurantoin, macrocrystal-monohydrate, (MACROBID) 100 MG capsule Take 100 mg by mouth daily. 09/24/21  Yes [provider]  omeprazole  (PRILOSEC) 20 MG capsule TAKE 2 CAPSULES BY MOUTH EVERY DAY Patient taking differently: Take 40 mg by mouth daily before breakfast. 03/26/24  Yes Desai, Nikita S, MD  Pediatric Multiple Vitamins (CHILDRENS MULTIVITAMIN) chewable tablet Chew 1 tablet by mouth in the morning.   Yes [provider]  pyridostigmine  (MESTINON ) 60 MG tablet Take 0.5 tablets (30 mg total) by mouth every 8 (eight) hours. 09/05/24  Yes Perri DELENA Meliton Mickey., MD  solifenacin (VESICARE) 5 MG tablet Take 5 mg by mouth daily.   Yes [provider]  thiamine  (VITAMIN B1) 100 MG tablet Take 1 tablet (100 mg total) by mouth daily. 09/05/24  Yes Perri DELENA Meliton Mickey., MD  vitamin D3  (CHOLECALCIFEROL) 25 MCG tablet Take 2,000 Units by mouth daily.   Yes [provider]  albuterol  (PROVENTIL ) (2.5 MG/3ML) 0.083% nebulizer solution USE 1 VIAL BY NEBULIZATION EVERY 4 HOURS AS NEEDED FOR WHEEZING OR SHORTNESS OF BREATH/DYSPNEA. G54.001 Patient not taking: Reported on 09/09/2024 07/01/23   [name], MD  albuterol  (VENTOLIN  HFA) 108 (90 Base) MCG/ACT inhaler INHALE 1-2 PUFFS BY MOUTH EVERY 6 HOURS AS NEEDED FOR WHEEZE OR SHORTNESS OF BREATH Patient not taking: Reported on 09/09/2024 03/26/24   Meade Verdon RAMAN, MD  AMBULATORY NON FORMULARY MEDICATION walker Dispense 1 Dx code: F74.447 Use as needed 07/17/24   Joane Artist RAMAN, MD  baclofen  (LIORESAL ) 10 MG tablet Take 0.5-1 tablets (5-10 mg total) by mouth 4 (four) times daily as needed for muscle spasms. Patient not taking: Reported on 09/09/2024 12/06/23   Kennyth,  Worth HERO, MD  feeding supplement (ENSURE PLUS HIGH PROTEIN) LIQD Take 237 mLs by mouth 2 (two) times daily between meals. Patient not taking: Reported on 09/09/2024 09/06/24   Perri DELENA Meliton Mickey., MD  glucose blood (FREESTYLE LITE) test strip CHECK BLOOD SUGAR THREE TIMES A DAY E11.9 09/09/21   [name], MD  ibuprofen  (ADVIL ) 800 MG tablet TAKE 1 TABLET BY MOUTH EVERY 8 HOURS AS NEEDED Patient not taking: Reported on 09/09/2024 09/09/20   [name], MD  Lancets Misc. GEORGANA 2 EXTRA) MISC Use to check blood sugar 3 times daily/ Dx E11.9 09/15/20   [name], MD  Spacer/Aero-Holding Raguel DEVI 1 each by Does not apply route as directed. 06/29/24   Meade Verdon RAMAN, MD   No Known Allergies Review of Systems  Unable to perform ROS: Other    Physical Exam Vitals and nursing note reviewed.  Constitutional:      General: He is not in acute distress.    Appearance: He is ill-appearing.  Pulmonary:     Effort: No respiratory distress.  Skin:    General: Skin is warm and dry.  Neurological:     Mental Status: He is alert.   Psychiatric:        Behavior: Behavior is cooperative.     Vital Signs: BP 123/66 (BP Location: Right Arm)   Pulse (!) 111   Temp 98.8 F (37.1 C) (Oral)   Resp 20   Ht 5' 9 (1.753 m)   Wt 68.7 kg   SpO2 97%   BMI 22.37 kg/m  Pain Scale: 0-10   Pain Score: 0-No pain   SpO2: SpO2: 97 % O2 Device:SpO2: 97 % O2 Flow Rate: .O2 Flow Rate (L/min): 3 L/min  IO: Intake/output summary:  Intake/Output Summary (Last 24 hours) at 09/10/2024 1540 Last data filed at 09/10/2024 1257 Gross per 24 hour  Intake 717 ml  Output --  Net 717 ml    LBM: Last BM Date : 09/09/24 Baseline Weight: Weight: 70 kg Most recent weight: Weight: 68.7 kg     Palliative Assessment/Data: PPS 20%     Time In: 1500 Time Out: 1600 Time Total: 60 minutes  Signed by: Jeoffrey HERO Sharps, NP   Please contact Palliative Medicine Team phone at [phone] for questions and concerns.  For individual provider: See Amion  *Portions of this note are a verbal dictation therefore any spelling and/or grammatical errors are due to the Dragon Medical One system interpretation.

## 2024-09-10 NOTE — Evaluation (Signed)
 Occupational Therapy Evaluation Patient Details Name: Austin [name] MRN: [id number] DOB: 10/23/1940 Today's Date: 09/10/2024   History of Present Illness   83 yo male admitted 11/30 fall at SNF with pending workup for possible GIB PMH asthma COPD overlap syndrome, chronic rhinitis, oral thrush, HTN, HLD, depression, prostate CA, recent dx mysathenia gravis ataxia with dyarthria, orthostatic hypotension, hospital delirium,  recently hospitalized 09/08/24 with multiple falls     Clinical Impressions Pt admitted from SNF rehab with above dx. Prior to SNF admission, pt was living with spouse, ambulatory with RW and Modified Independent with ADLs. Pt presents now with deficits in strength, standing balance, cognition and cardiopulmonary endurance. Pt also limited by lethargy this AM. Overall, pt requires Mod A for bed mobility, Min A for in-room mobility using RW. Pt requires Min A for UB ADL and Mod-Max A for LB ADLs based on functional skills assessments. As pt is below baseline and at high risk for continued falls, recommend return to postacute rehab facility for continued therapy < 3 hours per day.  HR to 123bpm     If plan is discharge home, recommend the following:   A lot of help with walking and/or transfers;A lot of help with bathing/dressing/bathroom;Assist for transportation;Help with stairs or ramp for entrance     Functional Status Assessment   Patient has had a recent decline in their functional status and demonstrates the ability to make significant improvements in function in a reasonable and predictable amount of time.     Equipment Recommendations   Other (comment) (TBD)     Recommendations for Other Services         Precautions/Restrictions   Precautions Precautions: Fall Recall of Precautions/Restrictions: Impaired Precaution/Restrictions Comments: watch O2 Restrictions Weight Bearing Restrictions Per Provider Order: No     Mobility Bed  Mobility Overal bed mobility: Needs Assistance Bed Mobility: Supine to Sit     Supine to sit: Mod assist, Min assist, +2 for physical assistance     General bed mobility comments: MOd A for trunk to mid line, Min A for LE to EOB. 2 person assist to get fully to sitting due to lethargy    Transfers Overall transfer level: Needs assistance Equipment used: Rolling walker (2 wheels) Transfers: Sit to/from Stand, Bed to chair/wheelchair/BSC Sit to Stand: Min assist, +2 physical assistance, +2 safety/equipment     Step pivot transfers: Min assist, +2 physical assistance, +2 safety/equipment     General transfer comment: Min A +2 for sit to stand from EOB to RW. Cues for safe hand placement. Min a navigating AD      Balance Overall balance assessment: Needs assistance, History of Falls Sitting-balance support: Bilateral upper extremity supported, Feet supported Sitting balance-Leahy Scale: Fair     Standing balance support: Bilateral upper extremity supported, During functional activity, Reliant on assistive device for balance Standing balance-Leahy Scale: Poor                             ADL either performed or assessed with clinical judgement   ADL Overall ADL's : Needs assistance/impaired Eating/Feeding: Set up;Sitting   Grooming: Sitting;Minimal assistance;Wash/dry face Grooming Details (indicate cue type and reason): assist to initiate task Upper Body Bathing: Minimal assistance;Sitting   Lower Body Bathing: Moderate assistance;Sit to/from stand;Sitting/lateral leans   Upper Body Dressing : Minimal assistance;Sitting   Lower Body Dressing: Maximal assistance;Sit to/from stand;Sitting/lateral leans   Toilet Transfer: Minimal assistance;Stand-pivot;Rolling walker (2 wheels);BSC/3in1  Toileting- Clothing Manipulation and Hygiene: Moderate assistance;Sit to/from stand;Sitting/lateral lean       Functional mobility during ADLs: Minimal assistance;Rolling  walker (2 wheels);Cueing for sequencing       Vision Baseline Vision/History: 1 Wears glasses Ability to See in Adequate Light: 0 Adequate Patient Visual Report: No change from baseline Vision Assessment?: No apparent visual deficits     Perception         Praxis         Pertinent Vitals/Pain Pain Assessment Pain Assessment: No/denies pain     Extremity/Trunk Assessment Upper Extremity Assessment Upper Extremity Assessment: Generalized weakness;Right hand dominant;RUE deficits/detail;LUE deficits/detail RUE Deficits / Details: history of RTC replacement. A/ROM shoulder flexion/abduction to 90 degrees or just under. LUE Deficits / Details: history of RTC replacement. A/ROM shoulder flexion/abduction to 90 degrees or just under.   Lower Extremity Assessment Lower Extremity Assessment: Defer to PT evaluation   Cervical / Trunk Assessment Cervical / Trunk Assessment: Kyphotic   Communication Communication Communication: Impaired Factors Affecting Communication: Reduced clarity of speech;Hearing impaired   Cognition Arousal: Lethargic Behavior During Therapy: WFL for tasks assessed/performed, Flat affect Cognition: Cognition impaired, Difficult to assess Difficult to assess due to: Impaired communication   Awareness: Intellectual awareness impaired, Online awareness impaired Memory impairment (select all impairments): Short-term memory Attention impairment (select first level of impairment): Selective attention   OT - Cognition Comments: lethargic but easily awakened. following one step commands with increased time. slower processing. difficulty recalling  name of rehab place he has been at. speech low volume and garbled - hard to understand at times                 Following commands: Impaired Following commands impaired: Follows one step commands with increased time     Cueing  General Comments   Cueing Techniques: Verbal cues;Gestural cues;Visual  cues  Pt 98% on 3L O2 via [place]. Down to 87% with ambulating and after ambulating 84% on room air less than 1 min back up to 89% on 3L O2 via . HR up to 123 bpm during gait.   Exercises     Shoulder Instructions      Home Living Family/patient expects to be discharged to:: Skilled nursing facility Living Arrangements: Spouse/significant other Available Help at Discharge: Family;Available 24 hours/day Type of Home: House Home Access: Level entry     Home Layout: Two level;Able to live on main level with bedroom/bathroom;Full bath on main level Alternate Level Stairs-Number of Steps: 15 Alternate Level Stairs-Rails: Right;Left;Can reach both Bathroom Shower/Tub: Producer, Television/film/video: Standard     Home Equipment: Shower seat - built in;Cane - single Librarian, Academic (2 wheels);Rollator (4 wheels);Grab bars - toilet;Grab bars - tub/shower   Additional Comments: previously living at home but discharged to [facility] after last admission for rehab  Lives With: Spouse    Prior Functioning/Environment Prior Level of Function : Needs assist       Physical Assist : Mobility (physical);ADLs (physical)     Mobility Comments: Pt was ambulating with RW, working on gait with PT at SNF ADLs Comments: Getting assistance with ADLs at SNF but prior to SNF, was able to manage ADLs at home    OT Problem List: Decreased strength;Decreased activity tolerance;Impaired balance (sitting and/or standing);Decreased safety awareness;Impaired UE functional use;Decreased cognition   OT Treatment/Interventions: Self-care/ADL training;Therapeutic exercise;DME and/or AE instruction;Therapeutic activities;Patient/family education;Balance training      OT Goals(Current goals can be found in the care plan section)  Acute Rehab OT Goals Patient Stated Goal: not feel so sleepy OT Goal Formulation: With patient Time For Goal Achievement: 09/24/24 Potential to Achieve Goals: Good ADL  Goals Pt Will Perform Lower Body Bathing: with min assist;sitting/lateral leans;sit to/from stand Pt Will Perform Lower Body Dressing: with min assist;sit to/from stand;sitting/lateral leans Pt Will Transfer to Toilet: with contact guard assist;ambulating Pt/caregiver will Perform Home Exercise Program: Increased strength;Both right and left upper extremity;With theraband;With Supervision;With written HEP provided   OT Frequency:  Min 2X/week    Co-evaluation PT/OT/SLP Co-Evaluation/Treatment: Yes Reason for Co-Treatment: For patient/therapist safety;To address functional/ADL transfers (pt initially very lethargic) PT goals addressed during session: Mobility/safety with mobility;Balance;Proper use of DME OT goals addressed during session: ADL's and self-care;Proper use of Adaptive equipment and DME      AM-PAC OT 6 Clicks Daily Activity     Outcome Measure Help from another person eating meals?: A Little Help from another person taking care of personal grooming?: A Little Help from another person toileting, which includes using toliet, bedpan, or urinal?: A Lot Help from another person bathing (including washing, rinsing, drying)?: A Lot Help from another person to put on and taking off regular upper body clothing?: A Little Help from another person to put on and taking off regular lower body clothing?: A Lot 6 Click Score: 15   End of Session Equipment Utilized During Treatment: Gait belt;Rolling walker (2 wheels);Oxygen Nurse Communication: Mobility status  Activity Tolerance: Patient tolerated treatment well;Patient limited by lethargy Patient left: in chair;with call bell/phone within reach;with chair alarm set;Other (comment) (GI PA at bedside)  OT Visit Diagnosis: Unsteadiness on feet (R26.81);Repeated falls (R29.6);Muscle weakness (generalized) (M62.81)                Time: 8986-8961 OT Time Calculation (min): 25 min Charges:  OT General Charges $OT Visit: 1 Visit OT  Evaluation $OT Eval Moderate Complexity: 1 Mod  Austin [name], OTR/L Acute Rehab Services Office: [phone]   Austin Fish 09/10/2024, 10:58 AM

## 2024-09-10 NOTE — Progress Notes (Signed)
 NIF -40 VC: 1.1 with good pt effort

## 2024-09-10 NOTE — Evaluation (Signed)
 Physical Therapy Evaluation Patient Details Name: Austin [name] MRN: [id number] DOB: [birth year] Today's Date: 09/10/2024  History of Present Illness  83 yo male admitted 11/30 fall at SNF with pending workup for possible GIB PMH asthma COPD overlap syndrome, chronic rhinitis, oral thrush, HTN, HLD, depression, prostate CA, recent dx mysathenia gravis ataxia with dyarthria, orthostatic hypotension, hospital delirium,  recently hospitalized 09/08/24 with multiple falls  Clinical Impression  Pt is presenting Min/Mod A +2 for bed mobility, 2 person Min A for sit to stand, transfers and short non-functional gait with RW. Pt requires assistance navigating AD and safety cues throughout session. Lives at home with good family support but family is unable to provide physical assistance. Due to pt current functional status, home set up and available assistance at home recommending skilled physical therapy services < 3 hours/day in order to address strength, balance and functional mobility to decrease risk for falls, injury, immobility, skin break down and re-hospitalization.          If plan is discharge home, recommend the following: Assistance with feeding;Help with stairs or ramp for entrance;Assist for transportation;A lot of help with walking and/or transfers;Two people to help with bathing/dressing/bathroom     Equipment Recommendations None recommended by PT     Functional Status Assessment Patient has had a recent decline in their functional status and demonstrates the ability to make significant improvements in function in a reasonable and predictable amount of time.     Precautions / Restrictions Precautions Precautions: Fall Recall of Precautions/Restrictions: Impaired Precaution/Restrictions Comments: Watch BP Restrictions Weight Bearing Restrictions Per Provider Order: No      Mobility  Bed Mobility Overal bed mobility: Needs Assistance Bed Mobility: Sit to Supine     Supine to  sit: Mod assist, Min assist, +2 for physical assistance     General bed mobility comments: MOd A for trunk to mid line, Min A for LE to EOB. 2 person assist to get fully to sitting due to lethargy    Transfers Overall transfer level: Needs assistance Equipment used: Rolling walker (2 wheels) Transfers: Sit to/from Stand, Bed to chair/wheelchair/BSC Sit to Stand: Min assist, +2 physical assistance, +2 safety/equipment   Step pivot transfers: Min assist, +2 physical assistance, +2 safety/equipment       General transfer comment: Min A +2 for sit to stand from EOB to RW. Cues for safe hand placement. Min a navigating AD    Ambulation/Gait Ambulation/Gait assistance: Min assist, +2 physical assistance, +2 safety/equipment Gait Distance (Feet): 20 Feet Assistive device: Rolling walker (2 wheels) Gait Pattern/deviations: Decreased stride length, Trunk flexed, Narrow base of support, Step-through pattern Gait velocity: decreased Gait velocity interpretation: <1.31 ft/sec, indicative of household ambulator   General Gait Details: short step through reciprocal gait pattern with flexed trunk requiring cues to stand up right. O2 sats 87% on room air ambulating. down to 84% after gait and quickly back up to 89% on 3L o2 via [place]    Balance Overall balance assessment: Needs assistance, History of Falls Sitting-balance support: Bilateral upper extremity supported, Feet supported Sitting balance-Leahy Scale: Fair     Standing balance support: Bilateral upper extremity supported, During functional activity, Reliant on assistive device for balance Standing balance-Leahy Scale: Poor Standing balance comment: Dependent on RW       Pertinent Vitals/Pain Pain Assessment Pain Assessment: No/denies pain    Home Living Family/patient expects to be discharged to:: Skilled nursing facility Living Arrangements: Spouse/significant other Available Help at Discharge: Family;Available 24  hours/day  Type of Home: House Home Access: Level entry     Alternate Level Stairs-Number of Steps: 15 Home Layout: Two level;Able to live on main level with bedroom/bathroom;Full bath on main level Home Equipment: Shower seat - built in;Cane - single Librarian, Academic (2 wheels);Rollator (4 wheels);Grab bars - toilet;Grab bars - tub/shower      Prior Function Prior Level of Function : Needs assist       Physical Assist : Mobility (physical);ADLs (physical)     Mobility Comments: Pt was ambulating with RW, working on gait with PT at SNF ADLs Comments: Getting assistance with ADLs at [facility]     Extremity/Trunk Assessment   Upper Extremity Assessment Upper Extremity Assessment: Defer to OT evaluation    Lower Extremity Assessment Lower Extremity Assessment: Generalized weakness    Cervical / Trunk Assessment Cervical / Trunk Assessment: Kyphotic  Communication   Communication Communication: Impaired Factors Affecting Communication: Reduced clarity of speech;Hearing impaired    Cognition Arousal: Lethargic Behavior During Therapy: WFL for tasks assessed/performed   PT - Cognitive impairments: Safety/Judgement     Following commands: Impaired Following commands impaired: Follows one step commands with increased time     Cueing Cueing Techniques: Verbal cues, Gestural cues, Visual cues     General Comments General comments (skin integrity, edema, etc.): Pt 98% on 3L O2 via [place]. Down to 87% with ambulating and after ambulating 84% on room air less than 1 min back up to 89% on 3L O2 via [place]. HR up to 123 bpm during gait.        Assessment/Plan    PT Assessment Patient needs continued PT services  PT Problem List Decreased strength;Decreased activity tolerance;Decreased balance;Decreased mobility;Decreased safety awareness;Cardiopulmonary status limiting activity       PT Treatment Interventions DME instruction;Gait training;Functional mobility  training;Therapeutic activities;Therapeutic exercise;Balance training;Patient/family education    PT Goals (Current goals can be found in the Care Plan section)  Acute Rehab PT Goals Patient Stated Goal: Get stronger to safely return Home PT Goal Formulation: With patient Time For Goal Achievement: 09/24/24 Potential to Achieve Goals: Good    Frequency Min 1X/week        AM-PAC PT 6 Clicks Mobility  Outcome Measure Help needed turning from your back to your side while in a flat bed without using bedrails?: A Lot Help needed moving from lying on your back to sitting on the side of a flat bed without using bedrails?: A Lot Help needed moving to and from a bed to a chair (including a wheelchair)?: A Lot Help needed standing up from a chair using your arms (e.g., wheelchair or bedside chair)?: A Little Help needed to walk in hospital room?: A Lot Help needed climbing 3-5 steps with a railing? : Total 6 Click Score: 12    End of Session Equipment Utilized During Treatment: Gait belt;Oxygen Activity Tolerance: Patient tolerated treatment well Patient left: in chair;with call bell/phone within reach;with chair alarm set Nurse Communication: Mobility status PT Visit Diagnosis: Difficulty in walking, not elsewhere classified (R26.2);Muscle weakness (generalized) (M62.81);Other abnormalities of gait and mobility (R26.89);Unsteadiness on feet (R26.81)    Time: 8978-8961 PT Time Calculation (min) (ACUTE ONLY): 17 min   Charges:   PT Evaluation $PT Eval Low Complexity: 1 Low   PT General Charges $$ ACUTE PT VISIT: 1 Visit       Dorothyann Maier, DPT, CLT  Acute Rehabilitation Services Office: [phone] (Secure chat preferred)   Dorothyann VEAR Maier 09/10/2024, 10:50 AM

## 2024-09-10 NOTE — Care Management CC44 (Signed)
 Condition Code 44 Documentation Completed  Patient Details  Name: Ronelle Smallman MRN: [id number] Date of Birth: 18-Jun-1941   Condition Code 44 given:  Yes Patient signature on Condition Code 44 notice:  Yes Documentation of 2 MD's agreement:  Yes Code 44 added to claim:  Yes    Waddell Barnie Rama, RN 09/10/2024, 3:50 PM

## 2024-09-10 NOTE — Hospital Course (Signed)
 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  He was seen by neurology, his myasthenia gravis workup is still underway, he was placed on Mestinon  and discharged to SNF few days ago   Patient now presented from SNF after another fall while getting out of the chair, he did not hurt himself, SNF staff also noted that earlier this afternoon patient had a bowel movement mixed with some bright red blood.   In the ER patient was found to have some bright red blood in his rectum during the exam by the ER physician, he was found to be slightly tachypneic for which neurology was consulted, neurology cleared him from myasthenia gravis exacerbation standpoint, GI was consulted for possible ongoing lower GI bleed and recommending continue monitoring Hgb and pursuing a Flex Sig in the AM. Palliative Care consulted for further GOC Discussion.   Assessment and Plan:  Recurrent Falls: Ongoing for several weeks.  Recently diagnosed with ataxia, orthostatic hypotension and possible myasthenia gravis.  This fall appears to be due to poor balance while he was getting out of the chair, he did not hit his head or hurt himself, no headache or focal deficits.  Monitor orthostatic hypotension, monitor on telemetry, PT/OT eval recommending SNF; Continue myasthenia medications and monitor -Palliative Care consulted for GOC discussion   Recent possible diagnosis of Myasthenia Gravis: Pyridostigmine  30 mg  po q8h continued, musk antibody panel still pending, postdischarge follow-up with neurology outpatient. Recently treated w/ IVIG x 5 Days. Seen by neurology Dr. Zeke her and recommending pyridostigmine  recommend ABGs and not relying on NIF. Supplemental 2 L oxygen for now, completely symptom-free at this point.  Stable VBG.  Neurology recommends outpatient follow-up with neurology as he may need immunosuppressive such as CellCept that can be determined at a later date.  Neurology also recommends avoiding medications that can worsen the trigger myasthenia gravis exacerbation including class Ia antiarrhythmic, magnesium, fluoroquinolones, macrolides, aminoglycosides, penicillin, curare, interferon alpha, Botox chronic, and use caution with calcium  channel blockers, beta-blockers or statins. -Palliative Care consulted for GOC discussion -CRP went from 9.9 -> 11.1 -> 12.2 -LA improved and went from 2.1 -> 1.8 -> 1.5; PCX is <0.10 x3   Lower GI bleed.  Acute, this sounds hemorrhoidal, BUN and H&H stable, on PPI which will be continued, soft diet, monitor CBC, type screen, check INR, agreeable for transfusion if needed, [facility] already called and planning on Flex Sig in the AM  Normocytic Anemia: Hgb/Hct Trend:  Recent Labs  Lab 09/02/24 0329 09/03/24 0354 09/09/24 1338 09/09/24 1406 09/09/24 1640 09/09/24 2231 09/10/24 0149  HGB 11.8* 12.5* 13.1 13.6 11.6* 10.8* 11.2*  HCT 35.0* 36.7* 40.1 40.0 34.0* 32.9* 34.3*  MCV 96.2 96.1 99.5  --   --  100.3* 100.0  -Check Anemia Panel in the AM. CTM for S/Sx of Bleeding; Having some Lower GI Bleeding so will be getting a Flexible Sigmoidoscopy and possible endoscopic intervention of needed.  -Repeat CBC in the AM  Hypokalemia: K+ was 3.3. Replete w/ po KCL 40 mEQ BID x2 and po K Phos Neutral 500 mg x1.  CTM and Replete as Necessary. Repeat CMP in the AM  Hypophosphatemia: Phos Level was 2.2. Replete w/ po K Phos Neutral 500 mg x1. CTM and Replete as  Necessary. Repeat Phos Level in the AM.    History of recently diagnosed COPD and ILD:  Mild shortness of breath, On 2 L supplemental oxygen for comfort and monitor.  Will need Postdischarge follow-up with pulmonary outpatient. C/w Albuterol  2.5 mg Neb q6hprn Wheezing and SOB   History of Sinus Tachycardia and Moderate AAS: Recent echocardiogram few months ago showed a preserved EF of 65%, mild LVH, moderate AAS.  Outpatient follow-up with cardiology was recommended which will be done.  Telemetry monitor.  Low-dose beta-blocker if develops resistant tachycardia, currently stable.  Check TSH and free T4.  History of hospital-acquired delirium and possible age-related cognitive decline At risk for delirium again and he is already mildly confused, wife and patient counseled.  Minimize narcotics and benzodiazepines and monitor. Delirium Precautions. Palliative Care Medicine    Dyslipidemia: C/w Fenofibrate  54 mg po Daily  Abnormal LFTs: AST went from 42 -> 28 and ALT went from 60 -> 45. CTM and Trend and repeat CMP in the AM and if not improving will obtain a RUQ U/S and an Acute Hepatitis Panel.    History of prostate cancer:  No acute issue.  GERD/GI Prophylaxis: Continue PPI w/ Pantoprazole  40 mg po Daily   Hypoalbuminemia: Patient's Albumin Lvl went from 2.9 -> 2.4 -> 2.0. CTM & Trend & Repeat CMP in the AM

## 2024-09-10 NOTE — Consult Note (Addendum)
 Consultation  Referring Provider: TRH/Singh Primary Care Physician:  [name], MD Primary Gastroenterologist: unassigned  Reason for Consultation: Rectal bleeding, admitted from skilled nursing facility after a fall   Attending physician's note  I personally saw the patient and performed a substantive portion of the medical decision making process for this encounter (including a complete performance of the key components : MDM, Hx and Exam), in conjunction with the APP.  I agree with the APP's note, impression, and  the management plan for the number and complexity of problems addressed at the encounter for the patient and take responsibility for that plan with its inherent risk of complications, morbidity, or mortality with additional input as follows.     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 Tapwater enema X2 in the morning N.p.o. after midnight Plan for flexible sigmoidoscopy for further evaluation and possible endoscopic intervention if needed  The patient's family at bedside was provided an opportunity to ask questions and all were answered. They agreed with the plan and demonstrated an understanding of the instructions.  Austin [name] , MD [phone]     HPI: Austin [name] is a 83 y.o. 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  He had been placed on Mestinon  during his very recent admission. He apparently fell yesterday, brought to the ER for further evaluation and on exam per the ER doc was noted to have some red blood mixed with stool on rectal exam. Patient is unaware of any recent rectal bleeding, he denies any rectal pain or discomfort, denies any abdominal pain.  He reports that he did have an issue a couple of months ago with constipation and straining and with that did see a little bit of red blood but has not seen any since.  He reports that he did have remote colonoscopy while he was living in [place]  but that was longer than 10 years ago and does recall being told that he had several polyps. He is not on any regular blood thinners, does take ibuprofen  as needed.  Hemodynamically stable in the emergency room Labs on admission yesterday WBC 8.2/hemoglobin 13.1/hematocrit 40.1-his baseline hemoglobin appears to be 12-13.  Respiratory panel negative UA still pending Blood culture done and no growth so far Sodium 136/potassium 3.9 BUN 13/creatinine 0.51 T. bili 0.6/alk phos 66/AST 42/ALT 60 BNP within normal limits Lactate 2.1 INR 1.1  X-ray yesterday no acute process stable lung abnormalities,  No recent abdominal imaging-he did have PET scan November 2025 showing multiple brachytherapy seeds throughout the prostate gland and evidence of radioactive urine within the urethra no evidence of metastatic adenopathy or metastatic prostate cancer   Hemoglobin today 11.2  He has not had any bowel movement since admission.  He has been up with physical therapy, currently sitting up in the chair.  Speech is slightly slurred at times a little bit difficult to understand.   Past Medical History:  Diagnosis Date   Asthma  Depression    Hypertension    Prostate cancer [facility])    Urinary incontinence     Past Surgical History:  Procedure Laterality Date   GALLBLADDER SURGERY   1997   REPLACEMENT TOTAL KNEE BILATERAL  2001   2007   surgery replacement Bilateral 2005   TOTAL SHOULDER REPLACEMENT Bilateral 2000    Prior to Admission medications   Medication Sig Start Date End Date Taking? Authorizing Provider  buPROPion  (WELLBUTRIN  XL) 300 MG 24 hr tablet Take 1 tablet (300 mg total) by mouth daily. 07/09/24  Yes [name], MD  cyanocobalamin  (VITAMIN B12) 1000 MCG tablet Take 1,000 mcg by mouth daily.   Yes [provider]  Ensure (ENSURE) Take 237 mLs by mouth See admin instructions. Drink 237 ml's by mouth at 9 AM and 5 PM   Yes [provider]  fenofibrate  micronized (LOFIBRA) 200 MG capsule TAKE 1 CAPSULE BY MOUTH EVERY DAY Patient taking differently: Take 200 mg by mouth daily before breakfast. 04/20/24  Yes [name], MD  fluticasone -salmeterol (ADVAIR  HFA) 45-21 MCG/ACT inhaler Inhale 2 puffs into the lungs 2 (two) times daily. 08/15/24  Yes Meade Verdon RAMAN, MD  gabapentin  (NEURONTIN ) 100 MG capsule TAKE 1 CAPSULE BY MOUTH EVERYDAY AT BEDTIME Patient taking differently: Take 100 mg by mouth at bedtime. 04/23/24  Yes [name], MD  loratadine  (CLARITIN ) 10 MG tablet TAKE 1 TABLET BY MOUTH EVERY DAY 02/02/24  Yes Desai, Nikita S, MD  magnesium oxide (MAG-OX) 400 (240 Mg) MG tablet TAKE 1 TABLET BY MOUTH EVERY DAY Patient taking differently: Take 400 mg by mouth daily. 06/01/24  Yes [name], MD  mirtazapine  (REMERON ) 15 MG tablet Take 1 tablet (15 mg total) by mouth at bedtime. 09/05/24 09/05/25 Yes Perri DELENA Meliton Mickey., MD  montelukast  (SINGULAIR ) 10 MG tablet TAKE 1 TABLET BY MOUTH EVERYDAY AT BEDTIME Patient taking differently: Take 10 mg by mouth at bedtime. 06/01/24  Yes Desai, Nikita S, MD  nitrofurantoin, macrocrystal-monohydrate, (MACROBID) 100 MG capsule Take 100 mg by mouth daily. 09/24/21  Yes [provider]  omeprazole  (PRILOSEC) 20 MG capsule TAKE 2 CAPSULES BY MOUTH EVERY DAY Patient taking  differently: Take 40 mg by mouth daily before breakfast. 03/26/24  Yes Desai, Nikita S, MD  Pediatric Multiple Vitamins (CHILDRENS MULTIVITAMIN) chewable tablet Chew 1 tablet by mouth in the morning.   Yes [provider]  pyridostigmine  (MESTINON ) 60 MG tablet Take 0.5 tablets (30 mg total) by mouth every 8 (eight) hours. 09/05/24  Yes Perri DELENA Meliton Mickey., MD  solifenacin (VESICARE) 5 MG tablet Take 5 mg by mouth daily.   Yes [provider]  thiamine  (VITAMIN B1) 100 MG tablet Take 1 tablet (100 mg total) by mouth daily. 09/05/24  Yes Perri DELENA Meliton Mickey., MD  vitamin D3 (CHOLECALCIFEROL) 25 MCG tablet Take 2,000 Units by mouth daily.   Yes [provider]  albuterol  (PROVENTIL ) (2.5 MG/3ML) 0.083% nebulizer solution USE 1 VIAL BY NEBULIZATION EVERY 4 HOURS AS NEEDED FOR WHEEZING OR SHORTNESS OF BREATH/DYSPNEA. G54.001 Patient not taking: Reported on 09/09/2024 07/01/23   [name], MD  albuterol  (VENTOLIN  HFA) [phone] Base) MCG/ACT inhaler INHALE 1-2 PUFFS BY MOUTH EVERY 6 HOURS AS NEEDED FOR WHEEZE OR SHORTNESS OF BREATH Patient not taking: Reported on 09/09/2024 03/26/24   Meade Verdon RAMAN, MD  AMBULATORY NON FORMULARY MEDICATION walker Dispense 1 Dx code: F74.447 Use as needed 07/17/24   Joane Artist RAMAN, MD  baclofen  (LIORESAL ) 10 MG tablet Take 0.5-1 tablets (5-10 mg total) by mouth 4 (four) times daily as needed for muscle spasms. Patient not taking: Reported on 09/09/2024 12/06/23   [name], MD  feeding supplement (ENSURE PLUS HIGH PROTEIN) LIQD Take 237 mLs by mouth 2 (two) times daily between meals. Patient not taking: Reported on 09/09/2024 09/06/24   Perri DELENA Meliton Mickey., MD  glucose blood (FREESTYLE LITE) test strip CHECK BLOOD SUGAR THREE TIMES A DAY E11.9 09/09/21   [name], MD  ibuprofen  (ADVIL ) 800 MG tablet TAKE 1 TABLET BY MOUTH EVERY 8 HOURS AS NEEDED Patient not taking: Reported on 09/09/2024 09/09/20   [name], MD   Lancets Misc. GEORGANA 2 EXTRA) MISC Use to check blood sugar 3 times daily/ Dx E11.9 09/15/20   [name], MD  Spacer/Aero-Holding Raguel DEVI 1 each by Does not apply route as directed. 06/29/24   Meade Verdon RAMAN, MD    Current Facility-Administered Medications  Medication Dose Route Frequency Provider Last Rate Last Admin   albuterol  (PROVENTIL ) (2.5 MG/3ML) 0.083% nebulizer solution 2.5 mg  2.5 mg Nebulization Q6H PRN Dalana Pfahler V, MD       cyanocobalamin  (VITAMIN B12) tablet 1,000 mcg  1,000 mcg Oral Q0600 Singh, Prashant K, MD   1,000 mcg at 09/10/24 9378   docusate sodium (COLACE) capsule 100 mg  100 mg Oral BID Singh, Prashant K, MD   100 mg at 09/09/24 2112   fenofibrate  tablet 54 mg  54 mg Oral Daily Singh, Prashant K, MD       fesoterodine (TOVIAZ) tablet 4 mg  4 mg Oral Daily Singh, Prashant K, MD       ipratropium-albuterol  (DUONEB) 0.5-2.5 (3) MG/3ML nebulizer solution 3 mL  3 mL Nebulization Once Francesca Elsie CROME, MD       mirtazapine  (REMERON ) tablet 15 mg  15 mg Oral QHS Singh, Prashant K, MD   15 mg at 09/09/24 2112   pantoprazole  (PROTONIX ) EC tablet 40 mg  40 mg Oral Daily Singh, Prashant K, MD       pyridostigmine  (MESTINON ) tablet 30 mg  30 mg Oral Q8H Singh, Prashant K, MD   30 mg at 09/10/24 9378   thiamine  (VITAMIN B1) tablet 100 mg  100 mg Oral Daily Singh, Prashant K, MD        Allergies as of 09/09/2024   (No Known Allergies)    Family History  Problem Relation Age of Onset   Diabetes Brother    Heart disease Paternal Grandfather    Asthma Neg Hx     Social History   Socioeconomic History   Marital status: Married    Spouse name: Not on file   Number of children: Not on file   Years of education: Not on file   Highest education level: 12th grade  Occupational History   Occupation: retired  Tobacco Use   Smoking status: Former    Types: Cigars    Quit date: 10/22/2019    Years since quitting: 4.8   Smokeless tobacco: Never    Tobacco comments:    smoked occ. with son. did not tolerate it long. 12/14/19.  Vaping Use   Vaping status: Never Used  Substance and Sexual Activity   Alcohol use: Yes   Drug use: Never   Sexual activity: Not Currently  Other Topics Concern   Not on file  Social History Narrative   Not on file   Social Drivers of Health  Financial Resource Strain: Low Risk  (09/27/2023)   Overall Financial Resource Strain (CARDIA)    Difficulty of Paying Living Expenses: Not hard at all  Food Insecurity: No Food Insecurity (09/09/2024)   Hunger Vital Sign    Worried About Running Out of Food in the Last Year: Never true    Ran Out of Food in the Last Year: Never true  Transportation Needs: No Transportation Needs (09/09/2024)   PRAPARE - Administrator, Civil Service (Medical): No    Lack of Transportation (Non-Medical): No  Physical Activity: Insufficiently Active (09/27/2023)   Exercise Vital Sign    Days of Exercise per Week: 3 days    Minutes of Exercise per Session: 30 min  Stress: No Stress Concern Present (09/27/2023)   [employer] of Occupational Health - Occupational Stress Questionnaire    Feeling of Stress : Only a little  Social Connections: Moderately Isolated (09/09/2024)   Social Connection and Isolation Panel    Frequency of Communication with Friends and Family: More than three times a week    Frequency of Social Gatherings with Friends and Family: More than three times a week    Attends Religious Services: Never    Database Administrator or Organizations: No    Attends Banker Meetings: Never    Marital Status: Married  Catering Manager Violence: Not At Risk (09/09/2024)   Humiliation, Afraid, Rape, and Kick questionnaire    Fear of Current or Ex-Partner: No    Emotionally Abused: No    Physically Abused: No    Sexually Abused: No    Review of Systems: Pertinent positive and negative review of systems were noted in the above HPI  section.  All other review of systems was otherwise negative.   Physical Exam: Vital signs in last 24 hours: Temp:  [97.3 F (36.3 C)-98.4 F (36.9 C)] 97.8 F (36.6 C) (12/01 0724) Pulse Rate:  [103-112] 105 (12/01 0724) Resp:  [14-30] 20 (12/01 0724) BP: (108-152)/(67-88) 118/71 (12/01 0724) SpO2:  [92 %-99 %] 97 % (12/01 0724) Weight:  [68.7 kg-70 kg] 68.7 kg (11/30 2315) Last BM Date : 09/09/24 General:   Alert,  Well-developed, well-nourished, elderly white male pleasant and cooperative in NAD sitting up in chair Head:  Normocephalic and atraumatic. Eyes:  Sclera clear, no icterus.   Conjunctiva pink. Ears:  Normal auditory acuity. Nose:  No deformity, discharge,  or lesions. Mouth:  No deformity or lesions.   Neck:  Supple; no masses or thyromegaly. Lungs:  Clear throughout to auscultation.   No wheezes, crackles, or rhonchi.  Heart:  Regular rate and rhythm; no murmurs, clicks, rubs,  or gallops. Abdomen:  Soft,nontender, BS active,nonpalp mass or hsm.   Rectal: Not repeated, noted red blood mixed with stool on rectal exam per ER doc yesterday Msk:  Symmetrical without gross deformities. . Pulses:  Normal pulses noted. Extremities:  Without clubbing or edema. Neurologic:  Alert and  oriented x3; extensive neurologic exam not done Skin:  Intact without significant lesions or rashes.. Psych:  Alert and cooperative. Normal mood and affect.  Intake/Output from previous day: 11/30 0701 - 12/01 0700 In: 999 [IV Piggyback:999] Out: -  Intake/Output this shift: Total I/O In: 300 [P.O.:300] Out: -   Lab Results: Recent Labs    09/09/24 1338 09/09/24 1406 09/09/24 1640 09/09/24 2231 09/10/24 0149  WBC 8.2  --   --  6.5 7.1  HGB 13.1   < > 11.6* 10.8* 11.2*  HCT 40.1   < > 34.0* 32.9* 34.3*  PLT 278  --   --  242 251   < > = values in this interval not displayed.   BMET Recent Labs    09/09/24 1338 09/09/24 1406 09/09/24 1640 09/10/24 0953  NA 136 143 142 138   K 3.9 4.0 3.9 3.3*  CL 102 101  --  105  CO2 25  --   --  28  GLUCOSE 122* 123*  --  208*  BUN 13 15  --  8  CREATININE 0.51* 0.50*  --  0.46*  CALCIUM  8.6*  --   --  8.1*   LFT Recent Labs    09/10/24 0953  PROT 6.9  ALBUMIN 2.0*  AST 28  ALT 45*  ALKPHOS 55  BILITOT 0.4   PT/INR Recent Labs    09/09/24 2023  LABPROT 14.8  INR 1.1   Hepatitis Panel No results for input(s): HEPBSAG, HCVAB, HEPAIGM, HEPBIGM in the last 72 hours.   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  #2 incidental finding of bright red blood on rectal exam mixed with stool Patient has not reported any recent melena or hematochezia, no bowel movement since admission Hemoglobin 11.6> 11.2  Patient does have history of prostate cancer and has had radiation seed implants. 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  He does report history of polyps with last colonoscopy over 10 years ago.  #3 asthma/COPD #4 status post multiple orthopedic surgeries #5 status post cholecystectomy  Plan; okay for regular diet today Continue to monitor serial hemoglobins thus far not manifesting any evidence of active GI bleed Will discuss with Dr. Cherith Tewell-likely will need flexible sigmoidoscopy versus colonoscopy and will be best served having this done while he is here given his debilitation and currently residing in a skilled facility.  Neuro following from myasthenia standpoint.     Amy EsterwoodPA-C  09/10/2024, 11:09 AM

## 2024-09-10 NOTE — Progress Notes (Signed)
 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  He was seen by neurology, his myasthenia gravis workup is still underway, he was placed on Mestinon  and discharged to SNF few days ago   Patient now presented from SNF after another fall while getting out of the chair, he did not hurt himself, SNF staff also noted that earlier this afternoon patient had a bowel movement mixed with some bright red blood.   In the ER patient was found to have some bright red blood in his rectum during the exam by the ER physician, he was found to be slightly tachypneic for which neurology was consulted, neurology cleared him from myasthenia gravis exacerbation standpoint, GI was consulted for possible ongoing lower GI bleed and recommending continue monitoring Hgb and pursuing a Flex Sig in the AM. Palliative Care consulted for further GOC Discussion.   Assessment and Plan:  Recurrent Falls: Ongoing for several weeks.  Recently diagnosed with ataxia, orthostatic hypotension and possible myasthenia gravis.  This fall appears to be due to poor balance while he was getting out of the chair, he did not hit his head or hurt himself, no headache or focal deficits.  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 Recently treated w/ IVIG x 5 Days. Seen by neurology Dr. Zeke her and recommending pyridostigmine  recommend ABGs and not relying on NIF. Supplemental 2 L oxygen for now, completely symptom-free at this point.  Stable VBG.  Neurology recommends outpatient follow-up with neurology as he may need immunosuppressive such as CellCept that can be determined at a later date.  Neurology also recommends avoiding medications that can worsen the trigger myasthenia gravis exacerbation including class Ia antiarrhythmic, magnesium, fluoroquinolones, macrolides, aminoglycosides, penicillin, curare, interferon alpha, Botox chronic, and use caution with calcium  channel blockers, beta-blockers or statins. -Palliative Care consulted for GOC discussion -CRP went from 9.9 -> 11.1 -> 12.2 -LA improved and went from 2.1 -> 1.8 -> 1.5; PCX is <0.10 x3   Lower GI bleed.  Acute, this sounds hemorrhoidal, BUN and H&H stable, on PPI which will be continued, soft diet, monitor CBC, type screen, check INR, agreeable for transfusion if needed, [facility] already called and planning on Flex Sig in the AM  Normocytic Anemia: Hgb/Hct Trend:  Recent Labs  Lab 09/02/24 0329 09/03/24 0354 09/09/24 1338 09/09/24 1406 09/09/24 1640 09/09/24 2231 09/10/24 0149  HGB 11.8* 12.5* 13.1 13.6 11.6* 10.8* 11.2*  HCT 35.0* 36.7* 40.1 40.0 34.0* 32.9* 34.3*  MCV 96.2 96.1 99.5  --   --  100.3* 100.0  -Check Anemia Panel in the AM. CTM for S/Sx of Bleeding; Having some Lower GI Bleeding so will be getting a Flexible Sigmoidoscopy and possible endoscopic intervention of needed.  -Repeat CBC in  the AM  Hypokalemia: K+ was 3.3. Replete w/ po KCL 40 mEQ BID x2 and po K Phos Neutral 500 mg x1. CTM and Replete as  Necessary. Repeat CMP in the AM  Hypophosphatemia: Phos Level was 2.2. Replete w/ po K Phos Neutral 500 mg x1. CTM and Replete as Necessary. Repeat Phos Level in the AM.    History of recently diagnosed COPD and ILD:  Mild shortness of breath, On 2 L supplemental oxygen for comfort and monitor.  Will need Postdischarge follow-up with pulmonary outpatient. C/w Albuterol  2.5 mg Neb q6hprn Wheezing and SOB   History of Sinus Tachycardia and Moderate AAS: Recent echocardiogram few months ago showed a preserved EF of 65%, mild LVH, moderate AAS.  Outpatient follow-up with cardiology was recommended which will be done.  Telemetry monitor.  Low-dose beta-blocker if develops resistant tachycardia, currently stable.  Check TSH and free T4.  History of hospital-acquired delirium and possible age-related cognitive decline At risk for delirium again and he is already mildly confused, wife and patient counseled.  Minimize narcotics and benzodiazepines and monitor. Delirium Precautions. Palliative Care Medicine    Dyslipidemia: C/w Fenofibrate  54 mg po Daily  Abnormal LFTs: AST went from 42 -> 28 and ALT went from 60 -> 45. CTM and Trend and repeat CMP in the AM and if not improving will obtain a RUQ U/S and an Acute Hepatitis Panel.    History of prostate cancer:  No acute issue.  GERD/GI Prophylaxis: Continue PPI w/ Pantoprazole  40 mg po Daily   Hypoalbuminemia: Patient's Albumin Lvl went from 2.9 -> 2.4 -> 2.0. CTM & Trend & Repeat CMP in the AM   DVT prophylaxis: SCDs Start: 09/09/24 1751    Code Status: Limited: Do not attempt resuscitation (DNR) -DNR-LIMITED -Do Not Intubate/DNI  Family Communication: No family present @ bedside  Disposition Plan:  Level of care: Progressive Status is: Observation The patient remains OBS appropriate and will d/c before 2 midnights.   Consultants:  Gastroenterology Palliative Care Medicine  Procedures:  As delineated as above  Antimicrobials:   Anti-infectives (From admission, onward)    None       Subjective: Seen and examined at bedside is very somnolent and drowsy and was little confused.  Wearing Supplemental oxygen and denied any pain or complaints.  Will wake up briefly but then fall back to sleep.  No other concerns or complaints at this time and no family at bedside  Objective: Vitals:   09/10/24 0522 09/10/24 0724 09/10/24 1117 09/10/24 1530  BP: 122/75 118/71 106/66 123/66  Pulse:  (!) 105 (!) 104 (!) 111  Resp: (!) 25 20 19 20   Temp:  97.8 F (36.6 C) 98.6 F (37 C) 98.8 F (37.1 C)  TempSrc:  Oral Oral Oral  SpO2:  97% 98% 97%  Weight:      Height:        Intake/Output Summary (Last 24 hours) at 09/10/2024 1632 Last data filed at 09/10/2024 1257 Gross per 24 hour  Intake 717 ml  Output --  Net 717 ml   Filed Weights   09/09/24 1313 09/09/24 2315  Weight: 70 kg 68.7 kg   Examination: Physical Exam:  Constitutional: Elderly chronically ill-appearing Caucasian male in no acute distress appears little somnolent and drowsy Respiratory: Diminished to auscultation bilaterally with some coarse breath sounds, no wheezing, rales, rhonchi or crackles. Normal respiratory effort and patient is not tachypenic. No accessory muscle use.  Wearing supplemental oxygen via nasal cannula Cardiovascular: RRR,  no murmurs / rubs / gallops. S1 and S2 auscultated. No extremity edema. Abdomen: Soft, non-tender, non-distended.  Bowel sounds positive.  GU: Deferred. Musculoskeletal: No clubbing / cyanosis of digits/nails. No joint deformity upper and lower extremities.  Skin: No rashes, lesions, ulcers on limited skin evaluation. No induration; Warm and dry.  Neurologic: Somnolent and Drowsy Psychiatric: Impaired judgement and insight and appears sleepy and drowsy  Data Reviewed: I have personally reviewed following labs and imaging studies  CBC: Recent Labs  Lab 09/09/24 1338 09/09/24 1406 09/09/24 1640  09/09/24 2231 09/10/24 0149  WBC 8.2  --   --  6.5 7.1  NEUTROABS 5.8  --   --   --  4.9  HGB 13.1 13.6 11.6* 10.8* 11.2*  HCT 40.1 40.0 34.0* 32.9* 34.3*  MCV 99.5  --   --  100.3* 100.0  PLT 278  --   --  242 251   Basic Metabolic Panel: Recent Labs  Lab 09/05/24 0429 09/09/24 1338 09/09/24 1406 09/09/24 1640 09/10/24 0149 09/10/24 0953  NA  --  136 143 142  --  138  K  --  3.9 4.0 3.9  --  3.3*  CL  --  102 101  --   --  105  CO2  --  25  --   --   --  28  GLUCOSE  --  122* 123*  --   --  208*  BUN  --  13 15  --   --  8  CREATININE 0.48* 0.51* 0.50*  --   --  0.46*  CALCIUM   --  8.6*  --   --   --  8.1*  MG  --   --   --   --  1.8 1.8  PHOS  --   --   --   --   --  2.2*   GFR: Estimated Creatinine Clearance: 68 mL/min (A) (by C-G formula based on SCr of 0.46 mg/dL (L)). Liver Function Tests: Recent Labs  Lab 09/09/24 1338 09/10/24 0953  AST 42* 28  ALT 60* 45*  ALKPHOS 66 55  BILITOT 0.6 0.4  PROT 8.1 6.9  ALBUMIN 2.4* 2.0*   No results for input(s): LIPASE, AMYLASE in the last 168 hours. No results for input(s): AMMONIA in the last 168 hours. Coagulation Profile: Recent Labs  Lab 09/09/24 2023  INR 1.1   Cardiac Enzymes: No results for input(s): CKTOTAL, CKMB, CKMBINDEX, TROPONINI in the last 168 hours. BNP (last 3 results) No results for input(s): PROBNP in the last 8760 hours. HbA1C: No results for input(s): HGBA1C in the last 72 hours. CBG: No results for input(s): GLUCAP in the last 168 hours. Lipid Profile: No results for input(s): CHOL, HDL, LDLCALC, TRIG, CHOLHDL, LDLDIRECT in the last 72 hours. Thyroid  Function Tests: Recent Labs    09/09/24 1713 09/09/24 2023  TSH  --  6.136*  FREET4 0.97  --    Anemia Panel: No results for input(s): VITAMINB12, FOLATE, FERRITIN, TIBC, IRON, RETICCTPCT in the last 72 hours. Sepsis Labs: Recent Labs  Lab 09/09/24 1407 09/09/24 1526 09/09/24 1713  09/10/24 0149 09/10/24 0953 09/10/24 1316  PROCALCITON  --   --  <0.10 <0.10 <0.10  --   LATICACIDVEN 2.1* 1.8  --   --  1.4 1.5   Recent Results (from the past 240 hours)  Culture, blood (routine x 2)     Status: None (Preliminary result)   Collection Time: 09/09/24  1:38 PM  Specimen: BLOOD  Result Value Ref Range Status   Specimen Description BLOOD SITE NOT SPECIFIED  Final   Special Requests   Final    BOTTLES DRAWN AEROBIC AND ANAEROBIC Blood Culture adequate volume   Culture   Final    NO GROWTH < 24 HOURS Performed at [facility], [address] [place], [place] [address]    Report Status PENDING  Incomplete  Resp panel by RT-PCR (RSV, Flu A&B, Covid) Anterior Nasal Swab     Status: None   Collection Time: 09/09/24  1:38 PM   Specimen: Anterior Nasal Swab  Result Value Ref Range Status   SARS Coronavirus 2 by RT PCR NEGATIVE NEGATIVE Final   Influenza A by PCR NEGATIVE NEGATIVE Final   Influenza B by PCR NEGATIVE NEGATIVE Final    Comment: (NOTE) The Xpert Xpress SARS-CoV-2/FLU/RSV plus assay is intended as an aid in the diagnosis of influenza from Nasopharyngeal swab specimens and should not be used as a sole basis for treatment. Nasal washings and aspirates are unacceptable for Xpert Xpress SARS-CoV-2/FLU/RSV testing.  Fact Sheet for Patients: bloggercourse.com  Fact Sheet for Healthcare Providers: seriousbroker.it  This test is not yet approved or cleared by the [place]  FDA and has been authorized for detection and/or diagnosis of SARS-CoV-2 by FDA under an Emergency Use Authorization (EUA). This EUA will remain in effect (meaning this test can be used) for the duration of the COVID-19 declaration under Section 564(b)(1) of the Act, 21 U.S.C. section 360bbb-3(b)(1), unless the authorization is terminated or revoked.     Resp Syncytial Virus by PCR NEGATIVE NEGATIVE Final    Comment:  (NOTE) Fact Sheet for Patients: bloggercourse.com  Fact Sheet for Healthcare Providers: seriousbroker.it  This test is not yet approved or cleared by the [place]  FDA and has been authorized for detection and/or diagnosis of SARS-CoV-2 by FDA under an Emergency Use Authorization (EUA). This EUA will remain in effect (meaning this test can be used) for the duration of the COVID-19 declaration under Section 564(b)(1) of the Act, 21 U.S.C. section 360bbb-3(b)(1), unless the authorization is terminated or revoked.  Performed at [facility], [address] [place], [place] [address]   Culture, blood (routine x 2)     Status: None (Preliminary result)   Collection Time: 09/09/24  1:43 PM   Specimen: BLOOD  Result Value Ref Range Status   Specimen Description BLOOD SITE NOT SPECIFIED  Final   Special Requests   Final    BOTTLES DRAWN AEROBIC AND ANAEROBIC Blood Culture adequate volume   Culture   Final    NO GROWTH < 24 HOURS Performed at [facility], [address], [place], [place] [address]    Report Status PENDING  Incomplete  MRSA Next Gen by PCR, Nasal     Status: None   Collection Time: 09/10/24  3:02 AM   Specimen: Nasal Mucosa; Nasal Swab  Result Value Ref Range Status   MRSA by PCR Next Gen NOT DETECTED NOT DETECTED Final    Comment: (NOTE) The GeneXpert MRSA Assay (FDA approved for NASAL specimens only), is one component of a comprehensive MRSA colonization surveillance program. It is not intended to diagnose MRSA infection nor to guide or monitor treatment for MRSA infections. Test performance is not FDA approved in patients less than 64 years old. Performed at [facility], [address], [place], [place] [address]     Radiology Studies: DG Chest Portable 1  View Result Date: 09/09/2024 CLINICAL DATA:  Rectal bleeding. Bright red blood after bowel movement this morning. EXAM: PORTABLE  CHEST 1 VIEW COMPARISON:  09/01/2024 and older exams.  CT, 09/05/2024. FINDINGS: Cardiac silhouette is normal in size. No mediastinal or hilar masses. Elevated right hemidiaphragm. Bilateral areas lung scarring, stable. No evidence of pneumonia or pulmonary edema. No pleural effusion or pneumothorax. Skeletal structures are demineralized. Partly imaged left shoulder prosthesis. IMPRESSION: 1. No acute cardiopulmonary disease. 2. Chronic lung abnormalities stable from the prior exams. Electronically Signed   By: Alm Parkins M.D.   On: 09/09/2024 14:35   Scheduled Meds:  cyanocobalamin   1,000 mcg Oral Q0600   docusate sodium   100 mg Oral BID   fenofibrate   54 mg Oral Daily   fesoterodine   4 mg Oral Daily   ipratropium-albuterol   3 mL Nebulization Once   mirtazapine   15 mg Oral QHS   pantoprazole   40 mg Oral Daily   phosphorus  500 mg Oral Once   potassium chloride   40 mEq Oral BID   pyridostigmine   30 mg Oral Q8H   thiamine   100 mg Oral Daily   Continuous Infusions:   LOS: 1 day   Alejandro Marker, DO [facility] Available via Epic secure chat 7am-7pm After these hours, please refer to coverage provider listed on [email or web address] 09/10/2024, 4:32 PM

## 2024-09-10 NOTE — Progress Notes (Signed)
 SLP Cancellation Note  Patient Details Name: Austin [name] MRN: [id number] DOB: Apr 30, 1941   Cancelled treatment:       Reason Eval/Treat Not Completed: Fatigue/lethargy limiting ability to participate. Pt is known to this SLP from recent admission. He is very sleepy this afternoon and falls back asleep upon waking. His wife is present though and says that swallowing is going well. They have no concerns about current diet, which is more solid than what he discharged consuming, but he likes this variety better. Will f/u for swallow eval as able.     Leita SAILOR., M.A. CCC-SLP Acute Rehabilitation Services Office: [phone]  Secure chat preferred  09/10/2024, 1:59 PM

## 2024-09-10 NOTE — TOC Progression Note (Signed)
 Transition of Care [facility]) - Progression Note    Patient Details  Name: Austin [name] MRN: [id number] Date of Birth: September 14, 1941  Transition of Care [facility]) CM/SW Contact  Arlana JINNY Nicholaus ISRAEL Phone Number: [phone] 09/10/2024, 4:19 PM  Clinical Narrative:   CSW met with patient ad wife at bedside. CSW met with the patient and wife to discuss the SNF process. Patient appeared lethargic, wife primarily spoke. Wife stated that she was concerned and unsure if SNF was appropriate at this time. Wife stated that he is coming from [facility] where he [facility] and having some bleeding. CSW inquired if the patient was interested in going back to [place]. Wife stated that she was unsure at this time. Wife inquired about palliative services. Wife stated that he seems to be declining and would like additional support from palliative services to discuss all of there options. CSW notified the following attending via secure chat that palliative consult needs to be placed. Wife stated that she would like her son to be present to discuss next steps as well.   Unit CSW/CM will continue to follow and monitor for dc readiness.                       Expected Discharge Plan and Services                                               Social Drivers of Health (SDOH) Interventions SDOH Screenings   Food Insecurity: No Food Insecurity (09/09/2024)  Housing: Low Risk  (09/09/2024)  Transportation Needs: No Transportation Needs (09/09/2024)  Utilities: Not At Risk (09/09/2024)  Depression (PHQ2-9): High Risk (07/09/2024)  Financial Resource Strain: Low Risk  (09/27/2023)  Physical Activity: Insufficiently Active (09/27/2023)  Social Connections: Moderately Isolated (09/09/2024)  Stress: No Stress Concern Present (09/27/2023)  Tobacco Use: Medium Risk (09/09/2024)  Health Literacy: Adequate Health Literacy (09/27/2023)    Readmission Risk Interventions     No data to display

## 2024-09-11 ENCOUNTER — Encounter

## 2024-09-11 ENCOUNTER — Encounter: Payer: Self-pay | Attending: Internal Medicine

## 2024-09-11 ENCOUNTER — Encounter (HOSPITAL_COMMUNITY): Payer: Self-pay

## 2024-09-11 DIAGNOSIS — Z7189 Other specified counseling: Secondary | ICD-10-CM | POA: Diagnosis not present

## 2024-09-11 DIAGNOSIS — K922 Gastrointestinal hemorrhage, unspecified: Secondary | ICD-10-CM | POA: Diagnosis not present

## 2024-09-11 DIAGNOSIS — G7000 Myasthenia gravis without (acute) exacerbation: Secondary | ICD-10-CM | POA: Diagnosis not present

## 2024-09-11 DIAGNOSIS — R0600 Dyspnea, unspecified: Secondary | ICD-10-CM

## 2024-09-11 DIAGNOSIS — Z515 Encounter for palliative care: Secondary | ICD-10-CM

## 2024-09-11 DIAGNOSIS — Z66 Do not resuscitate: Secondary | ICD-10-CM

## 2024-09-11 DIAGNOSIS — Z789 Other specified health status: Secondary | ICD-10-CM

## 2024-09-11 LAB — COMPREHENSIVE METABOLIC PANEL WITH GFR
ALT: 39 U/L (ref 0–44)
AST: 24 U/L (ref 15–41)
Albumin: 2 g/dL — ABNORMAL LOW (ref 3.5–5.0)
Alkaline Phosphatase: 58 U/L (ref 38–126)
Anion gap: 5 (ref 5–15)
BUN: 12 mg/dL (ref 8–23)
CO2: 31 mmol/L (ref 22–32)
Calcium: 8.6 mg/dL — ABNORMAL LOW (ref 8.9–10.3)
Chloride: 105 mmol/L (ref 98–111)
Creatinine, Ser: 0.46 mg/dL — ABNORMAL LOW (ref 0.61–1.24)
GFR, Estimated: >60 mL/min (ref >60)
Glucose, Bld: 112 mg/dL — ABNORMAL HIGH (ref 70–99)
Potassium: 4.1 mmol/L (ref 3.5–5.1)
Sodium: 141 mmol/L (ref 135–145)
Total Bilirubin: 0.3 mg/dL (ref 0.0–1.2)
Total Protein: 7 g/dL (ref 6.5–8.1)

## 2024-09-11 LAB — CBC WITH DIFFERENTIAL/PLATELET
Abs Immature Granulocytes: 0.02 K/uL (ref 0.00–0.07)
Basophils Absolute: 0 K/uL (ref 0.0–0.1)
Basophils Relative: 0 %
Eosinophils Absolute: 0.4 K/uL (ref 0.0–0.5)
Eosinophils Relative: 6 %
HCT: 35 % — ABNORMAL LOW (ref 39.0–52.0)
Hemoglobin: 11.4 g/dL — ABNORMAL LOW (ref 13.0–17.0)
Immature Granulocytes: 0 %
Lymphocytes Relative: 22 %
Lymphs Abs: 1.5 K/uL (ref 0.7–4.0)
MCH: 32.6 pg (ref 26.0–34.0)
MCHC: 32.6 g/dL (ref 30.0–36.0)
MCV: 100 fL (ref 80.0–100.0)
Monocytes Absolute: 0.9 K/uL (ref 0.1–1.0)
Monocytes Relative: 13 %
Neutro Abs: 4 K/uL (ref 1.7–7.7)
Neutrophils Relative %: 59 %
Platelets: 251 K/uL (ref 150–400)
RBC: 3.5 MIL/uL — ABNORMAL LOW (ref 4.22–5.81)
RDW: 14.4 % (ref 11.5–15.5)
WBC: 6.7 K/uL (ref 4.0–10.5)
nRBC: 0 % (ref 0.0–0.2)

## 2024-09-11 LAB — BRAIN NATRIURETIC PEPTIDE: B Natriuretic Peptide: 55.9 pg/mL (ref 0.0–100.0)

## 2024-09-11 LAB — PROCALCITONIN: Procalcitonin: <0.1 ng/mL

## 2024-09-11 LAB — C-REACTIVE PROTEIN: CRP: 12 mg/dL — ABNORMAL HIGH (ref <1.0)

## 2024-09-11 LAB — PHOSPHORUS: Phosphorus: 3.2 mg/dL (ref 2.5–4.6)

## 2024-09-11 LAB — MAGNESIUM: Magnesium: 1.8 mg/dL (ref 1.7–2.4)

## 2024-09-11 MED ORDER — POLYVINYL ALCOHOL 1.4 % OP SOLN: 1.0000 [drp] | Freq: Four times a day (QID) | OPHTHALMIC | Status: DC | PRN

## 2024-09-11 MED ORDER — OXYCODONE HCL 5 MG PO TABS: 5.0000 mg | ORAL_TABLET | ORAL | Status: DC | PRN

## 2024-09-11 MED ORDER — HALOPERIDOL LACTATE 5 MG/ML IJ SOLN: 2.0000 mg | Freq: Four times a day (QID) | INTRAMUSCULAR | Status: DC | PRN

## 2024-09-11 MED ORDER — LORAZEPAM 1 MG PO TABS: 1.0000 mg | ORAL_TABLET | ORAL | Status: DC | PRN

## 2024-09-11 MED ORDER — HALOPERIDOL LACTATE 2 MG/ML PO CONC: 2.0000 mg | Freq: Four times a day (QID) | ORAL | Status: DC | PRN

## 2024-09-11 MED ORDER — MORPHINE SULFATE (PF) 2 MG/ML IV SOLN: 1.0000 mg | INTRAVENOUS | Status: DC | PRN

## 2024-09-11 MED ORDER — HALOPERIDOL 1 MG PO TABS: 2.0000 mg | ORAL_TABLET | Freq: Four times a day (QID) | ORAL | Status: DC | PRN

## 2024-09-11 MED ORDER — GLYCOPYRROLATE 0.2 MG/ML IJ SOLN: 0.2000 mg | INTRAMUSCULAR | Status: DC | PRN

## 2024-09-11 MED ORDER — GLYCOPYRROLATE 1 MG PO TABS: 1.0000 mg | ORAL_TABLET | ORAL | Status: DC | PRN

## 2024-09-11 MED ORDER — LORAZEPAM 2 MG/ML IJ SOLN: 1.0000 mg | INTRAMUSCULAR | Status: DC | PRN

## 2024-09-11 MED ORDER — FLEET ENEMA RE ENEM
1.0000 | ENEMA | Freq: Once | RECTAL | Status: DC
  Filled 2024-09-11: qty 1

## 2024-09-11 MED ORDER — ACETAMINOPHEN 650 MG RE SUPP: 650.0000 mg | Freq: Four times a day (QID) | RECTAL | Status: DC | PRN

## 2024-09-11 MED ORDER — ONDANSETRON 4 MG PO TBDP: 4.0000 mg | ORAL_TABLET | Freq: Four times a day (QID) | ORAL | Status: DC | PRN

## 2024-09-11 MED ORDER — LORAZEPAM 2 MG/ML PO CONC: 1.0000 mg | ORAL | Status: DC | PRN

## 2024-09-11 MED ORDER — BIOTENE DRY MOUTH MT LIQD
15.0000 mL | Freq: Two times a day (BID) | OROMUCOSAL | Status: DC
  Administered 2024-09-12 – 2024-09-14 (×3): 15 mL via TOPICAL

## 2024-09-11 MED ORDER — DIPHENHYDRAMINE HCL 50 MG/ML IJ SOLN: 25.0000 mg | INTRAMUSCULAR | Status: DC | PRN

## 2024-09-11 MED ORDER — DOCUSATE SODIUM 100 MG PO CAPS: 100.0000 mg | ORAL_CAPSULE | Freq: Every day | ORAL | Status: DC | PRN

## 2024-09-11 MED ORDER — ACETAMINOPHEN 325 MG PO TABS: 650.0000 mg | ORAL_TABLET | Freq: Four times a day (QID) | ORAL | Status: DC | PRN

## 2024-09-11 MED ORDER — ONDANSETRON HCL 4 MG/2ML IJ SOLN: 4.0000 mg | Freq: Four times a day (QID) | INTRAMUSCULAR | Status: DC | PRN

## 2024-09-11 NOTE — TOC Initial Note (Addendum)
 Transition of Care [facility]) - Initial/Assessment Note    Patient Details  Name: Austin [name] MRN: [id number] Date of Birth: 02/13/1941  [facility]) CM/SW Contact:    Luise JAYSON Pan, LCSWA Phone Number: 09/11/2024, 10:48 AM  Clinical Narrative:   CSW spoke with [name] [phone]), pts son/POA (paperwork in chart), about discharge plans. Per [name], family has a meeting with Palliative at 1PM today. [name] expressed that family is interested in Palliative care and inquired about costs of care. CSW explained that depending on disposition plan (Long term care facility, hospice home, ALF, etc) the prices vary. CSW explained that if patient were to go to a SNF ltc that the price ranges from about $9,000-$12,000+ per month. CSW explained that ALF's range from about $3,000-$5,000 per month.   3:02 PM Patient now comfort care. Palliative initiated referral to [facility] per family's wishes. [facility] to follow up.   CSW to follow up once Palliative has seen and family decides on discharge plan.    Expected Discharge Plan: Skilled Nursing Facility Barriers to Discharge: Continued Medical Work up   Patient Goals and CMS Choice Patient states their goals for this hospitalization and ongoing recovery are:: To have Palliative involved          Expected Discharge Plan and Services In-house Referral: Clinical Social Work, Hospice / Palliative Care   Post Acute Care Choice:  (TBD) Living arrangements for the past 2 months: Single Family Home                                      Prior Living Arrangements/Services Living arrangements for the past 2 months: Single Family Home Lives with:: Spouse Patient language and need for interpreter reviewed:: Yes Do you feel safe going back to the place where you live?: Yes      Need for Family Participation in Patient Care: Yes (Comment) Care giver support system in place?: Yes (comment) Current home services: DME Criminal  Activity/Legal Involvement Pertinent to Current Situation/Hospitalization: No - Comment as needed  Activities of Daily Living   ADL Screening (condition at time of admission) Independently performs ADLs?: No Does the patient have a NEW difficulty with bathing/dressing/toileting/self-feeding that is expected to last >3 days?: Yes (Initiates electronic notice to provider for possible OT consult) Does the patient have a NEW difficulty with getting in/out of bed, walking, or climbing stairs that is expected to last >3 days?: Yes (Initiates electronic notice to provider for possible PT consult) Does the patient have a NEW difficulty with communication that is expected to last >3 days?: No Is the patient deaf or have difficulty hearing?: Yes Does the patient have difficulty seeing, even when wearing glasses/contacts?: No Does the patient have difficulty concentrating, remembering, or making decisions?: No  Permission Sought/Granted Permission sought to share information with : Family Supports Permission granted to share information with : Yes, Verbal Permission Granted  Share Information with NAME: [name] Curly ; Pharmacologist     Permission granted to share info w Relationship: Spouse; Son (POA)  Permission granted to share info w Contact Information: Khalfani, Weideman (Spouse) [phone] ; [phone]  Emotional Assessment Appearance:: Appears stated age Attitude/Demeanor/Rapport: Unable to Assess Affect (typically observed): Unable to Assess Orientation: : Oriented to Self, Oriented to Place, Oriented to  Time, Oriented to Situation Alcohol / Substance Use: Not Applicable Psych Involvement: No (comment)  Admission diagnosis:  Lower GI bleeding [K92.2] LGI bleed [K92.2]  Dyspnea, unspecified type [R06.00] Patient Active Problem List   Diagnosis Date Noted   LGI bleed 09/09/2024   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) 09/03/2024   Orthostatic hypotension 09/03/2024   Interstitial lung  disease (HCC) 09/03/2024   Ground-level fall 09/03/2024   Laceration of occipital scalp 09/03/2024   Unintentional weight loss 09/03/2024   Protein-calorie malnutrition, severe 08/31/2024   Leg edema 07/09/2024   Senile purpura 07/09/2024   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   Seasonal allergic rhinitis 08/31/2021   Vitamin B12 deficiency 02/10/2021   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 [place] [address] Phone: [phone] Fax: [phone]     Social Drivers of Health (SDOH) Social History: SDOH Screenings   Food Insecurity: No Food Insecurity (09/09/2024)  Housing: Low Risk  (09/09/2024)  Transportation Needs: No Transportation Needs (09/09/2024)  Utilities: Not At Risk (09/09/2024)  Depression (PHQ2-9): High Risk (07/09/2024)  Financial Resource Strain: Low Risk  (09/27/2023)  Physical Activity: Insufficiently Active (09/27/2023)  Social Connections: Moderately Isolated (09/09/2024)  Stress: No Stress Concern Present (09/27/2023)  Tobacco Use: Medium Risk (09/09/2024)  Health Literacy: Adequate Health Literacy (09/27/2023)   SDOH Interventions:     Readmission Risk Interventions     No data to display

## 2024-09-11 NOTE — Anesthesia Preprocedure Evaluation (Signed)
 Anesthesia Evaluation    Airway        Dental   Pulmonary asthma , COPD, former smoker Interstitial lung disease          Cardiovascular hypertension, + Peripheral Vascular Disease  + Valvular Problems/Murmurs AS   2025: IMPRESSIONS     1. Left ventricular ejection fraction, by estimation, is 65 to 70%. The  left ventricle has normal function. The left ventricle has no regional  wall motion abnormalities. There is mild concentric left ventricular  hypertrophy. Indeterminate diastolic  filling due to E-A fusion. There is the interventricular septum is  flattened in systole and diastole, consistent with right ventricular  pressure and volume overload.   2. Right ventricular systolic function is moderately reduced. The right  ventricular size is moderately enlarged. Tricuspid regurgitation signal is  inadequate for assessing PA pressure.   3. The mitral valve is grossly normal. Trivial mitral valve  regurgitation. No evidence of mitral stenosis.   4. The aortic valve was not well visualized. Aortic valve regurgitation  is mild. Moderate aortic valve stenosis. Aortic valve area, by VTI  measures 1.17 cm. Aortic valve mean gradient measures 19.0 mmHg. Aortic  valve Vmax measures 2.71 m/s.   5. The inferior vena cava is normal in size with greater than 50%  respiratory variability, suggesting right atrial pressure of 3 mmHg.     Neuro/Psych  PSYCHIATRIC DISORDERS  Depression       GI/Hepatic ,GERD  ,,Rectal bleeding   Endo/Other    Renal/GU    Hx of prostate cancer    Musculoskeletal   Abdominal   Peds  Hematology Hg 11.4   Anesthesia Other Findings Myastenia Gavis: 90mg  of pyridostigmine  dialy  Reproductive/Obstetrics                              Anesthesia Physical Anesthesia Plan  ASA: 3  Anesthesia Plan: MAC   Post-op Pain Management:    Induction: Intravenous  PONV Risk  Score and Plan: 1 and Propofol infusion and Treatment may vary due to age or medical condition  Airway Management Planned: Natural Airway  Additional Equipment:   Intra-op Plan:   Post-operative Plan:   Informed Consent: I have reviewed the patients History and Physical, chart, labs and discussed the procedure including the risks, benefits and alternatives for the proposed anesthesia with the patient or authorized representative who has indicated his/her understanding and acceptance.     Dental advisory given  Plan Discussed with: CRNA  Anesthesia Plan Comments:          Anesthesia Quick Evaluation

## 2024-09-11 NOTE — Progress Notes (Signed)
 Daily Progress Note   Patient Name: Austin [name]       Date: 09/11/2024 DOB: May 17, 1941  Age: 83 y.o. MRN#: [id number] Attending Physician: Sherrill Alejandro Donovan, DO Primary Care Physician: [name], MD Admit Date: 09/09/2024  Reason for Consultation/Follow-up: Establishing goals of care  Subjective: I have reviewed medical records including: EPIC notes: TOC, SLP, respiratory therapy, hospitalist, GI. Plan was for patient to undergo flex sig this morning. MAR: Meds on hold for procedure.  Patient received 2 fleets enemas Available advanced directives in ACP: HCPOA -primary is son/ Dietitian and secondary is [employer]: Creatinine 0.46 assessed for opioid prescribing and prognostication, hgb 11.4, low albumin 2.0 assessed for overall health, nutritional status, and disease severity, helping predict prognosis and guide care.  Received report from primary RN -no acute concerns.  Per RN, flex sig procedure was rescheduled to this afternoon.  Visited patient at bedside for 1 PM scheduled meeting.  Patient is lying in bed awake, alert, and able to participate in conversation; however, his speech is slurred and very difficult to understand - this has worsened since I saw him last week during previous admission. No signs or non-verbal gestures of pain or discomfort noted. No respiratory distress, increased work of breathing, or secretions noted.  Patient is intermittently awake/asleep during visit today.  --------------------------------------------------------------------------------------------  Advance Care Planning Conversation   Pertinent diagnoses: Possible myasthenia gravis, GI bleed  The patient and/or family consented to a voluntary Advance Care Planning conversation in  person. Individuals present for the conversation:  Patient, son/Peter, wife/Holly, granddaughter/Bri  Summary of the conversation:  Emotional support provided to patient and family.   They have a clear understanding of patient's current acute medical situation.  Patient and family feel that his clinical status continues to decline.  They wish to discuss next steps in regards to discharge planning.  Reviewed options for discharge back to STR vs hospice enrollment.  Patient and family indicate that they are not interested in his discharge back to rehab and wish to discuss how hospice can support him for end of life.  The difference between residential versus home hospice discussed in detail.  Unsure at this time if patient qualifies for inpatient hospice facility. Family confirm patient could not return home without additional support as his wife is unable to provide the  level of care he needs by herself.  Reviewed home hospice options to include LTC vs private caregiving at home with hospice.   Family wish for patient to be evaluated for inpatient hospice facility and will make stepwise decisions pending evaluation outcome.  They request referral sent to [facility] as this is closest to the wife's home.  If he is not approved they are open for ongoing discussions regarding LTC vs private caregivers + hospice.  We talked about transition to comfort measures in house and what that would entail inclusive of medications to control pain, dyspnea, agitation, nausea, and itching. We discussed stopping all unnecessary measures such as blood draws, needle sticks, oxygen, antibiotics, cardiac monitoring, IVF, and frequent vital signs. Education provided that other non-pharmacological interventions would be utilized for holistic support and all care would focus on how the patient is looking and feeling.  Patient/family opted for his transition to full comfort care today.  No longer pursuing flex sig.  All  questions and concerns addressed. Encouraged to call with questions and/or concerns. PMT card provided.  Outcome of the conversation: - Now full comfort care - [facility] evaluation pending.  If not approved family are open to ongoing discussions regarding LTC versus private caregiving at home with hospice  I spent 35 minutes providing separately identifiable ACP services with the patient and/or surrogate decision maker in a voluntary, in-person conversation discussing the patient's wishes and goals as detailed in the above note.   Length of Stay: 1  Current Medications: Scheduled Meds:   cyanocobalamin   1,000 mcg Oral Q0600   docusate sodium  100 mg Oral BID   fenofibrate   54 mg Oral Daily   fesoterodine  4 mg Oral Daily   ipratropium-albuterol   3 mL Nebulization Once   mirtazapine   15 mg Oral QHS   pantoprazole   40 mg Oral Daily   potassium chloride  40 mEq Oral BID   pyridostigmine   30 mg Oral Q8H   sodium phosphate  1 enema Rectal Once   thiamine   100 mg Oral Daily    Continuous Infusions:   PRN Meds: albuterol   Physical Exam Vitals and nursing note reviewed.  Constitutional:      General: He is not in acute distress.    Appearance: He is ill-appearing.  Pulmonary:     Effort: No respiratory distress.  Skin:    General: Skin is warm and dry.  Neurological:     Mental Status: He is oriented to person, place, and time.     Motor: Weakness present.     Comments: sleepy  Psychiatric:        Attention and Perception: Attention normal.        Speech: Speech is slurred.        Behavior: Behavior is cooperative.        Cognition and Memory: Cognition and memory normal.             Vital Signs: BP 137/74 (BP Location: Left Arm)   Pulse 99   Temp 99 F (37.2 C) (Axillary)   Resp 20   Ht 5' 9 (1.753 m)   Wt 68.7 kg   SpO2 100%   BMI 22.37 kg/m  SpO2: SpO2: 100 % O2 Device: O2 Device: Nasal Cannula O2 Flow Rate: O2 Flow Rate (L/min): 3  L/min  Intake/output summary:  Intake/Output Summary (Last 24 hours) at 09/11/2024 1250 Last data filed at 09/11/2024 0931 Gross per 24 hour  Intake 417 ml  Output 1400  ml  Net -983 ml   LBM: Last BM Date : 09/11/24 Baseline Weight: Weight: 70 kg Most recent weight: Weight: 68.7 kg       Palliative Assessment/Data: PPS 20%      Patient Active Problem List   Diagnosis Date Noted   LGI bleed 09/09/2024   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) 09/03/2024   Orthostatic hypotension 09/03/2024   Interstitial lung disease (HCC) 09/03/2024   Ground-level fall 09/03/2024   Laceration of occipital scalp 09/03/2024   Unintentional weight loss 09/03/2024   Protein-calorie malnutrition, severe 08/31/2024   Leg edema 07/09/2024   Senile purpura 07/09/2024   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   Seasonal allergic rhinitis 08/31/2021   Vitamin B12 deficiency 02/10/2021   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 male  with past medical history of  asthma/COPD overlap syndrome, possible interstitial lung disease recent diagnosis chronic rhinitis, recent oral thrush treated with fluconazole , HTN, HLD, history of hemorrhoids, depression, prostate cancer, recent diagnosis of possible underlying myasthenia gravis workup still in progress,  ataxia with dysarthria, orthostatic hypotension, hospital-acquired delirium, who was recently admitted to the hospital due to multiple falls which were diagnosed to be secondary to ataxia, orthostatic hypotension and possible myasthenia gravis.  He was seen by neurology, his myasthenia gravis workup is still underway, he was placed on Mestinon  and discharged to SNF few days ago. He is now admitted on 09/09/2024 after another fall at SNF as well as acute lower GI bleed.    Neurology was reconsulted and they do no feed this is a MG exacerbation. GI was also consulted plan for sigmoidoscopy to further evaluate with possible endoscopic intervention if needed.   Patient has had 2 admissions in the last 6 months.  He is a 7-day readmission.  Assessment: Principal Problem:   LGI bleed   Terminal care  Recommendations/Plan: Initiated full comfort measures Continue DNR/DNI as previously documented Patient/family interested in his transfer to [facility] - TOC consult placed; TOC and hospice liaison notified If patient is not approved for inpatient hospice facility, they are open for ongoing discussions regarding LTC vs private duty caregiving at home with hospice Started orders for EOL symptom management as noted below discontinued orders that were not focused on comfort Unrestricted visitation orders were placed per current [facility] EOL visitation policy  Nursing to provide frequent assessments and administer PRN medications as clinically necessary to ensure EOL comfort PMT will continue to follow and support holistically  Symptom Management Morphine IV PRN severe pain/dyspnea/increased work of breathing/RR>25 Oxycodone  PRN moderate pain Tylenol  PRN pain/fever Biotin twice daily Benadryl  PRN itching Robinul PRN secretions Haldol PRN agitation/delirium Ativan  PRN anxiety/seizure/sleep/distress Zofran  PRN nausea/vomiting Liquifilm Tears PRN dry eye   Goals of Care and Additional  Recommendations: Limitations on Scope of Treatment: Full Comfort Care  Code Status:    Code Status Orders  (From admission, onward)  Start     Ordered   09/09/24 1751  Do not attempt resuscitation (DNR)- Limited -Do Not Intubate (DNI)  Continuous       Question Answer Comment  If pulseless and not breathing No CPR or chest compressions.   In Pre-Arrest Conditions (Patient Is Breathing and Has A Pulse) Do not intubate. Provide all appropriate non-invasive medical interventions. Avoid ICU transfer unless indicated or required.   Consent: Discussion documented in EHR or advanced directives reviewed      09/09/24 1751           Code Status History     Date Active Date Inactive Code Status Order ID Comments User Context   09/05/2024 1210 09/06/2024 1748 Limited: Do not attempt resuscitation (DNR) -DNR-LIMITED -Do Not Intubate/DNI  [id number]  Claudene Jeoffrey HERO, NP Inpatient   08/29/2024 1345 09/05/2024 1210 Full Code [id number]  Georgina Basket, MD ED   08/29/2024 1339 08/29/2024 1345 Full Code [id number]  Georgina Basket, MD ED       Prognosis:  Poor  Discharge Planning: Inpatient hospice facility vs LTC + hospice vs home with private duty caregivers + hospice  Care plan was discussed with primary RN, patient, patient's family, TOC, hospice liaison, Dr. Sherrill  Thank you for allowing the Palliative Medicine Team to assist in the care of this patient.  Billing based on MDM: High  Problems Addressed: One acute or chronic illness or injury that poses a threat to life or bodily function  Amount and/or Complexity of Data: Category 1:Review of prior external note(s) from each unique source, Review of the result(s) of each unique test, and Assessment requiring an independent historian(s) and Category 3:Discussion of management or test interpretation with external physician/other qualified health care professional/appropriate source (not separately reported)  Risks: Parenteral  controlled substances, Decision regarding elective major surgery with identified patient or procedure risk factors, and Decision regarding hospitalization or escalation of hospital care     Laurita Peron HERO Claudene, NP  Please contact Palliative Medicine Team phone at [phone] for questions and concerns.   *Portions of this note are a verbal dictation therefore any spelling and/or grammatical errors are due to the Dragon [employer] One system interpretation.

## 2024-09-11 NOTE — Progress Notes (Signed)
 SLP Cancellation Note  Patient Details Name: Lenton Gendreau MRN: [id number] DOB: July 09, 1941   Cancelled treatment:       Reason Eval/Treat Not Completed: Medical issues which prohibited therapy. Pt NPO this morning pending GI procedure. Will f/u as able for swallow eval.    Leita SAILOR., M.A. CCC-SLP Acute Rehabilitation Services Office: [phone]  Secure chat preferred  09/11/2024, 8:22 AM

## 2024-09-11 NOTE — Progress Notes (Signed)
 Pt woke up to do NIF/VC. Still had good pt effort. NIF -40 VC .8

## 2024-09-11 NOTE — Progress Notes (Signed)
 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  He was seen by neurology, his myasthenia gravis wass still underway, he was placed on Mestinon  and discharged to SNF few days ago   Patient now presented from SNF after another fall while getting out of the chair, he did not hurt himself; SNF staff also noted that earlier this afternoon patient had a bowel movement mixed with some bright red blood.   In the ER patient was found to have some bright red blood in his rectum during the exam by the ER physician, he was found to be slightly tachypneic for which neurology was consulted, neurology cleared him from myasthenia gravis exacerbation standpoint, GI was consulted for possible ongoing lower GI bleed and recommending continue monitoring Hgb and pursuing a Flex Sig in the AM. Palliative Care consulted for further GOC Discussion subsequently the family has decided to transition the patient to full comfort measures and elected to forego the flex sigmoidoscopy. The [facility] has been consulted and will evaluate him for [facility] placement in the AM  Assessment and Plan:  Recurrent Falls: Ongoing for several weeks.  Recently diagnosed with ataxia, orthostatic hypotension and possible myasthenia gravis.  This fall appears to be due  to poor balance while he was getting out of the chair, he did not hit his head or hurt himself, no headache or focal deficits.  Monitoring orthostatic hypotension, Was monitoring on telemetry but will stop, PT/OT eval recommending SNF; Continue myasthenia medications and monitor -Palliative Care consulted for GOC discussion and now transitioning to full comfort care.  All medications on the patient's comfort have been discontinued.  Patient will be transferred to the 6 [address] Floor for further Comfort Care.    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 Recently treated w/ IVIG x 5 Days. Seen by neurology Dr. Zeke her and recommending pyridostigmine  recommend ABGs and not relying on NIF. Supplemental 2 L oxygen for now, completely symptom-free at this point.  Stable VBG.  Neurology recommends outpatient follow-up with neurology as he may need immunosuppressive such as CellCept that can be determined at a later date.  Neurology also recommends avoiding medications that can worsen the trigger myasthenia gravis exacerbation including class Ia antiarrhythmic, magnesium, fluoroquinolones, macrolides, aminoglycosides, penicillin, curare, interferon alpha, Botox chronic, and use caution with calcium  channel blockers, beta-blockers or statins. -Palliative Care consulted for GOC discussion -CRP went from 9.9 -> 11.1 -> 12.2 and is now 12.0 but will not check any further -LA improved and went from 2.1 -> 1.8 -> 1.5; PCX is <0.10 x3   Lower GI bleed.  Acute, this sounds hemorrhoidal, BUN and H&H stable, on PPI which will be continued, soft diet, monitor CBC, type screen, check INR, agreeable for transfusion if needed,  GI already called and are planning for flex sigmoidoscopy this morning but after further goals of care discussion  and family has chosen to transition him to full comfort  measures  Normocytic Anemia: Hgb/Hct Trend:  Recent Labs  Lab 09/03/24 0354 09/09/24 1338 09/09/24 1406 09/09/24 1640 09/09/24 2231 09/10/24 0149 09/11/24 0241  HGB 12.5* 13.1 13.6 11.6* 10.8* 11.2* 11.4*  HCT 36.7* 40.1 40.0 34.0* 32.9* 34.3* 35.0*  MCV 96.1 99.5  --   --  100.3* 100.0 100.0  -Will not Check Anemia Panel and will not CTM for S/Sx of Bleeding; Was Having some Lower GI Bleeding and was to get a  Flexible Sigmoidoscopy and possible endoscopic intervention of needed but being transitioned to Comfort Care, Will not Repeat CBC in the AM  Hypokalemia: K+ was 4.1. Will not CTM and Replete as Necessary and will not Repeat CMP in the AM given transition to Comfort Measures  Hypophosphatemia: Phos Level is now 3.2. Discontinue Lab Draws   History of recently diagnosed COPD and ILD:  Mild shortness of breath, On 2 L supplemental oxygen for comfort and monitor.  Will need Postdischarge follow-up with pulmonary outpatient. C/w Albuterol  2.5 mg Neb q6hprn Wheezing and SOB   History of Sinus Tachycardia and Moderate AAS: Recent echocardiogram few months ago showed a preserved EF of 65%, mild LVH, moderate AAS.  Transitioning to Comfort  History of hospital-acquired delirium and possible age-related cognitive decline At risk for delirium again and he is already mildly confused, wife and patient counseled.  Minimize narcotics and benzodiazepines and monitor. Delirium Precautions. Palliative Care Medicine consulted and now being transitioned to Comfort Measures    Dyslipidemia: Discontinue Fenofibrate  54 mg po Daily  Abnormal LFTs: Improved. AST went from 42 -> 28 -> 24 and ALT went from 60 -> 45 -> 39. CTM and Trend and repeat CMP in the AM and if not improving will obtain a RUQ U/S and an Acute Hepatitis Panel.    History of prostate cancer:  No acute issue and now transitioning to Comfort Care  GERD/GI Prophylaxis: Discontinue PPI w/ Pantoprazole  40 mg po Daily   Hypoalbuminemia:  Patient's Albumin Lvl went from 2.9 -> 2.4 -> 2.0 x2. Will NOT CTM & Trend & Repeat CMP in the AM   DVT prophylaxis: None as patient is now comfort care    Code Status: Do not attempt resuscitation (DNR) - Comfort care Family Communication: No family currently at bedside but the palliative care team had a very extensive discussion with the family  Disposition Plan:  Level of care: Palliative Care Status is: Observation The patient remains OBS appropriate and will d/c before 2 midnights.   Consultants:  Gastroenterology Palliative care medicine  Procedures:  As delineated as above  Antimicrobials:  Anti-infectives (From admission, onward)    None       Subjective: Seen and examined at bedside and is a little sleepy and little withdrawn.  Difficult to understand as he mumbles.  No nausea or vomiting.  Headache and pain.  No other concerns requested this time.  Objective: Vitals:   09/11/24 0500 09/11/24 0740 09/11/24 0747 09/11/24 1116  BP:    137/74  Pulse: 99   99  Resp: 20 (!) 24  20  Temp:   97.8 F (36.6 C) 99 F (37.2 C)  TempSrc:   Oral Axillary  SpO2:    100%  Weight:      Height:        Intake/Output Summary (Last 24 hours) at 09/11/2024 1531 Last data filed at 09/11/2024 0931 Gross per 24 hour  Intake --  Output  1400 ml  Net -1400 ml   Filed Weights   09/09/24 1313 09/09/24 2315  Weight: 70 kg 68.7 kg   Examination: Physical Exam:  Constitutional: Elderly chronically ill-appearing Caucasian male who is resting and a little confused Respiratory: Diminished to auscultation bilaterally with some coarse breath sounds, no wheezing, rales, rhonchi or crackles. Normal respiratory effort and patient is not tachypenic. No accessory muscle use.  Unlabored breathing but is wearing supplemental oxygen nasal cannula Cardiovascular: RRR, no murmurs / rubs / gallops. S1 and S2 auscultated.  Mild extremity edema Abdomen: Soft, non-tender, slightly distended  secondary body habitus. Bowel sounds positive.  GU: Deferred. Musculoskeletal: No clubbing / cyanosis of digits/nails. No joint deformity upper and lower extremities.  Skin: No rashes, lesions, ulcers on limited skin evaluation. No induration; Warm and dry.  Neurologic: Neuro somnolent drowsy but easily arousable but does mumble and is little difficult to understand Psychiatric: Mildly confused and is somnolent and drowsy but wakes up and is calm  Data Reviewed: I have personally reviewed following labs and imaging studies  CBC: Recent Labs  Lab 09/09/24 1338 09/09/24 1406 09/09/24 1640 09/09/24 2231 09/10/24 0149 09/11/24 0241  WBC 8.2  --   --  6.5 7.1 6.7  NEUTROABS 5.8  --   --   --  4.9 4.0  HGB 13.1 13.6 11.6* 10.8* 11.2* 11.4*  HCT 40.1 40.0 34.0* 32.9* 34.3* 35.0*  MCV 99.5  --   --  100.3* 100.0 100.0  PLT 278  --   --  242 251 251   Basic Metabolic Panel: Recent Labs  Lab 09/05/24 0429 09/09/24 1338 09/09/24 1406 09/09/24 1640 09/10/24 0149 09/10/24 0953 09/11/24 0241  NA  --  136 143 142  --  138 141  K  --  3.9 4.0 3.9  --  3.3* 4.1  CL  --  102 101  --   --  105 105  CO2  --  25  --   --   --  28 31  GLUCOSE  --  122* 123*  --   --  208* 112*  BUN  --  13 15  --   --  8 12  CREATININE 0.48* 0.51* 0.50*  --   --  0.46* 0.46*  CALCIUM   --  8.6*  --   --   --  8.1* 8.6*  MG  --   --   --   --  1.8 1.8 1.8  PHOS  --   --   --   --   --  2.2* 3.2   GFR: Estimated Creatinine Clearance: 68 mL/min (A) (by C-G formula based on SCr of 0.46 mg/dL (L)). Liver Function Tests: Recent Labs  Lab 09/09/24 1338 09/10/24 0953 09/11/24 0241  AST 42* 28 24  ALT 60* 45* 39  ALKPHOS 66 55 58  BILITOT 0.6 0.4 0.3  PROT 8.1 6.9 7.0  ALBUMIN 2.4* 2.0* 2.0*   No results for input(s): LIPASE, AMYLASE in the last 168 hours. No results for input(s): AMMONIA in the last 168 hours. Coagulation Profile: Recent Labs  Lab 09/09/24 2023  INR 1.1   Cardiac  Enzymes: No results for input(s): CKTOTAL, CKMB, CKMBINDEX, TROPONINI in the last 168 hours. BNP (last 3 results) No results for input(s): PROBNP in the last 8760 hours. HbA1C: No results for input(s): HGBA1C in the last 72 hours. CBG: No results for input(s): GLUCAP in the last 168 hours. Lipid Profile: No results for input(s): CHOL,  HDL, LDLCALC, TRIG, CHOLHDL, LDLDIRECT in the last 72 hours. Thyroid  Function Tests: Recent Labs    09/09/24 1713 09/09/24 2023  TSH  --  6.136*  FREET4 0.97  --    Anemia Panel: No results for input(s): VITAMINB12, FOLATE, FERRITIN, TIBC, IRON, RETICCTPCT in the last 72 hours. Sepsis Labs: Recent Labs  Lab 09/09/24 1407 09/09/24 1526 09/09/24 1713 09/10/24 0149 09/10/24 0953 09/10/24 1316 09/11/24 0241  PROCALCITON  --   --  <0.10 <0.10 <0.10  --  <0.10  LATICACIDVEN 2.1* 1.8  --   --  1.4 1.5  --    Recent Results (from the past 240 hours)  Culture, blood (routine x 2)     Status: None (Preliminary result)   Collection Time: 09/09/24  1:38 PM   Specimen: BLOOD  Result Value Ref Range Status   Specimen Description BLOOD SITE NOT SPECIFIED  Final   Special Requests   Final    BOTTLES DRAWN AEROBIC AND ANAEROBIC Blood Culture adequate volume   Culture   Final    NO GROWTH 2 DAYS Performed at [facility], [address], [place], [place] [address]    Report Status PENDING  Incomplete  Resp panel by RT-PCR (RSV, Flu A&B, Covid) Anterior Nasal Swab     Status: None   Collection Time: 09/09/24  1:38 PM   Specimen: Anterior Nasal Swab  Result Value Ref Range Status   SARS Coronavirus 2 by RT PCR NEGATIVE NEGATIVE Final   Influenza A by PCR NEGATIVE NEGATIVE Final   Influenza B by PCR NEGATIVE NEGATIVE Final    Comment: (NOTE) The Xpert Xpress SARS-CoV-2/FLU/RSV plus assay is intended as an aid in the diagnosis of influenza from Nasopharyngeal swab specimens and should not be used as a sole  basis for treatment. Nasal washings and aspirates are unacceptable for Xpert Xpress SARS-CoV-2/FLU/RSV testing.  Fact Sheet for Patients: bloggercourse.com  Fact Sheet for Healthcare Providers: seriousbroker.it  This test is not yet approved or cleared by the [place]  FDA and has been authorized for detection and/or diagnosis of SARS-CoV-2 by FDA under an Emergency Use Authorization (EUA). This EUA will remain in effect (meaning this test can be used) for the duration of the COVID-19 declaration under Section 564(b)(1) of the Act, 21 U.S.C. section 360bbb-3(b)(1), unless the authorization is terminated or revoked.     Resp Syncytial Virus by PCR NEGATIVE NEGATIVE Final    Comment: (NOTE) Fact Sheet for Patients: bloggercourse.com  Fact Sheet for Healthcare Providers: seriousbroker.it  This test is not yet approved or cleared by the [place]  FDA and has been authorized for detection and/or diagnosis of SARS-CoV-2 by FDA under an Emergency Use Authorization (EUA). This EUA will remain in effect (meaning this test can be used) for the duration of the COVID-19 declaration under Section 564(b)(1) of the Act, 21 U.S.C. section 360bbb-3(b)(1), unless the authorization is terminated or revoked.  Performed at [facility], [address], [place], [place] [address]   Culture, blood (routine x 2)     Status: None (Preliminary result)   Collection Time: 09/09/24  1:43 PM   Specimen: BLOOD  Result Value Ref Range Status   Specimen Description BLOOD SITE NOT SPECIFIED  Final   Special Requests   Final    BOTTLES DRAWN AEROBIC AND ANAEROBIC Blood Culture adequate volume   Culture   Final    NO GROWTH 2 DAYS Performed at [facility], [address], [place],  [place] [address]    Report Status PENDING  Incomplete  MRSA Next Gen by PCR, Nasal     Status: None    Collection Time: 09/10/24  3:02 AM   Specimen: Nasal Mucosa; Nasal Swab  Result Value Ref Range Status   MRSA by PCR Next Gen NOT DETECTED NOT DETECTED Final    Comment: (NOTE) The GeneXpert MRSA Assay (FDA approved for NASAL specimens only), is one component of a comprehensive MRSA colonization surveillance program. It is not intended to diagnose MRSA infection nor to guide or monitor treatment for MRSA infections. Test performance is not FDA approved in patients less than 24 years old. Performed at [facility], [address] [place], [place] [address]     Radiology Studies: No results found.  Scheduled Meds:  antiseptic oral rinse  15 mL Topical BID   ipratropium-albuterol   3 mL Nebulization Once   mirtazapine   15 mg Oral QHS   potassium chloride  40 mEq Oral BID   Continuous Infusions:   LOS: 1 day   Alejandro Marker, DO [facility] Available via Epic secure chat 7am-7pm After these hours, please refer to coverage provider listed on [email or web address] 09/11/2024, 3:31 PM

## 2024-09-12 ENCOUNTER — Ambulatory Visit: Admitting: Internal Medicine

## 2024-09-12 ENCOUNTER — Telehealth: Payer: Self-pay

## 2024-09-12 DIAGNOSIS — K922 Gastrointestinal hemorrhage, unspecified: Secondary | ICD-10-CM | POA: Diagnosis not present

## 2024-09-12 DIAGNOSIS — Z515 Encounter for palliative care: Secondary | ICD-10-CM | POA: Diagnosis not present

## 2024-09-12 LAB — MUSK ANTIBODIES: MuSK Antibodies: <1 U/mL

## 2024-09-12 NOTE — Progress Notes (Signed)
 Daily Progress Note   Patient Name: Austin [name]       Date: 09/12/2024 DOB: 05/14/1941  Age: 83 y.o. MRN#: [id number] Attending Physician: Patsy Lenis, MD Primary Care Physician: [name], MD Admit Date: 09/09/2024  Reason for Consultation/Follow-up: Establishing goals of care  Subjective: Medical records reviewed including progress notes, labs and imaging, mar. Patient assessed at the bedside. He is pleasant and denies any disturbing symptoms. His son [name] is present visiting.  Created space and opportunity for patient and family's thoughts and feelings on patient's current illness. They are awaiting evaluation for possible [facility] placement. Son has a good understanding of other options if this does not work out. Encouraged patient to request any comfort medications and report any distress for continued symptom management.  Questions and concerns addressed. PMT will continue to support holistically.   Length of Stay: 1   Physical Exam Vitals and nursing note reviewed.  Constitutional:      General: He is not in acute distress.    Appearance: He is ill-appearing.  Pulmonary:     Effort: No respiratory distress.  Skin:    General: Skin is warm and dry.  Neurological:     Mental Status: He is oriented to person, place, and time.     Motor: Weakness present.     Comments: sleepy  Psychiatric:        Attention and Perception: Attention normal.        Speech: Speech is slurred.        Behavior: Behavior is cooperative.        Cognition and Memory: Cognition and memory normal.            Vital Signs: BP 129/81 (BP Location: Left Arm)   Pulse (!) 102   Temp 97.7 F (36.5 C) (Oral)   Resp 16   Ht 5' 9 (1.753 m)   Wt 68.7 kg   SpO2 92%   BMI 22.37 kg/m  SpO2:  SpO2: 92 % O2 Device: O2 Device: Room Air O2 Flow Rate: O2 Flow Rate (L/min): 3 L/min       Palliative Assessment/Data: PPS 20%   Palliative Care Assessment & Plan   Patient Profile: 83 y.o. 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  He was seen by neurology, his myasthenia gravis workup is still underway, he was placed on Mestinon  and discharged to SNF few days ago. He is now admitted on 09/09/2024 after another fall at SNF as well as acute lower GI bleed.    Neurology was reconsulted and they do no feed this is a MG exacerbation. GI was also consulted plan for sigmoidoscopy to further evaluate with possible endoscopic intervention if needed.   Patient has had 2 admissions in the last 6 months.  He is a 7-day  readmission.  Assessment: Principal Problem:   LGI bleed   Terminal care  Recommendations/Plan: Continue DNR/DNI Continue comfort-focused care, no adjustments required today Patient awaits evaluation for [facility] eligibility per family interest PMT will continue to follow and support  Symptom Management Morphine IV PRN severe pain/dyspnea/increased work of breathing/RR>25 Oxycodone  PRN moderate pain Tylenol  PRN pain/fever Biotin twice daily Benadryl  PRN itching Robinul PRN secretions Haldol PRN agitation/delirium Ativan  PRN anxiety/seizure/sleep/distress Zofran  PRN nausea/vomiting Liquifilm Tears PRN dry eye   Prognosis:  Poor  Discharge  Planning: TBD, hospice services   Care plan was discussed with patient, patient's son   Cleora Karnik, PA-C Palliative Medicine Team Team phone # [phone]  Thank you for allowing the Palliative Medicine Team to assist in the care of this patient. Please utilize secure chat with additional questions, if there is no response within 30 minutes please call the above phone number.  Palliative Medicine Team providers are available by phone from 7am to 7pm daily and can be reached through the team cell phone.  Should this patient require assistance outside of these hours, please call the patient's attending physician.  Portions of this note are a verbal dictation therefore any spelling and/or grammatical errors are due to the Dragon Medical One system interpretation.    Time Total: 25  Visit consisted of counseling and education dealing with the complex and emotionally intense issues of symptom management and palliative care in the setting of serious and potentially life-threatening illness. Greater than 50% of this time was spent counseling and coordinating care related to the above assessment and plan.  Personally spent 25 minutes in patient care including extensive chart review (labs, imaging, progress/consult notes, vital signs), medically appropraite exam, discussed with treatment team, education to patient, family, and staff, documenting clinical information, medication review and management, coordination of care, and available advanced directive documents.

## 2024-09-12 NOTE — Telephone Encounter (Signed)
 Copied from CRM #[id number]. Topic: Clinical - Medication Question >> Sep 10, 2024  3:55 PM Rozanna G wrote: Reason for CRM: pt spouse calling about pt having a procedure tomorrow morning and she has some concerns and question about this before he has this done. Please reach Mrs. Cosner at [phone]. She is requesting to speak with Dr Meade   Procedure was canceled

## 2024-09-12 NOTE — Progress Notes (Signed)
 Progress Note    Austin [name]   FMW:[id number]  DOB: [birth year]  DOA: 09/09/2024     1 PCP: [name], MD  Initial CC: rectal bleeding  Hospital Course: Mr. 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   He was seen by neurology, his myasthenia gravis workup was still in process; he was placed on Mestinon  and discharged to SNF.    Patient now presented from SNF after another fall while getting out of the chair, he did not hurt himself; SNF staff also noted that patient had a bowel movement mixed with some bright red blood.   In the ER patient was found to have some bright red blood in his rectum during the exam by the ER physician, he was found to be slightly tachypneic for which neurology was consulted. Neurology cleared him from myasthenia gravis exacerbation standpoint, GI was consulted for possible ongoing lower GI bleed and recommending continue monitoring Hgb and pursuing a Flex Sig. Palliative Care consulted for further GOC discussion and subsequently the family has decided to transition the patient to full comfort measures and elected to forego the flex sigmoidoscopy.  Assessment and Plan:  Recurrent Falls - Ongoing for several weeks.  Recently diagnosed with ataxia, orthostatic hypotension and possible myasthenia gravis.  This fall appears to be due to poor balance while he was getting out of the chair, he did not hit his head or hurt himself, no headache or  focal deficits.  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 - Seen again by neurology - Main focus now is comfort care   Lower GI bleed - Acute, this sounds hemorrhoidal, BUN and H&H stable, on PPI which will be continued - diet liberated - continue comfort care - no further plans for flex sig at this time  Normocytic Anemia - no further workup; see GIB  Hypokalemia - repleted   Hypophosphatemia - repleted    History of recently diagnosed COPD and ILD:   Mild shortness of breath, On 2 L supplemental oxygen for comfort   History of Sinus Tachycardia and Moderate AAS: Recent echocardiogram few months ago showed a preserved EF of 65%, mild LVH, moderate AAS.  Transitioning to Comfort  History of hospital-acquired delirium and possible age-related cognitive decline At risk for delirium  - continue comfort measures    Dyslipidemia: Discontinue Fenofibrate  54 mg po Daily  Abnormal LFTs: Improved   History of prostate cancer:  No acute issue and now transitioning to Comfort Care  GERD/GI Prophylaxis: Discontinue PPI  Hypoalbuminemia - continue comfort  Interval History:  Resting comfortably in bed this morning.  Son present bedside.  Patient seems to have minimal complaints at this time.  No pain. Oral intake sounds decreased.    Antimicrobials:   DVT prophylaxis:     Code Status:   Code Status: Do not attempt resuscitation (DNR) - Comfort care  Mobility Assessment (  Last 72 Hours)     Mobility Assessment     Row Name 09/12/24 0900 09/11/24 2050 09/11/24 1734 09/11/24 0900 09/10/24 2055   Does the patient have exclusion criteria? No- Perform mobility assessment No- Perform mobility assessment No- Perform mobility assessment No-  Perform mobility assessment No- Perform mobility assessment   What is the highest level of mobility based on the mobility assessment? Level 2 (Chairfast) - Balance while sitting on edge of bed and cannot stand Level 2 (Chairfast) - Balance while sitting on edge of bed and cannot stand Level 2 (Chairfast) - Balance while sitting on edge of bed and cannot stand Level 2 (Chairfast) - Balance while sitting on edge of bed and cannot stand Level 4 (Ambulates with assistance) - Balance while stepping forward/back - Complete   Is the above level different from baseline mobility prior to current illness? Yes - Recommend PT order Yes - Recommend PT order Yes - Recommend PT order Yes - Recommend PT order --    Row Name 09/10/24 1051 09/10/24 1049 09/10/24 1000 09/09/24 2330     Does the patient have exclusion criteria? -- -- No- Perform mobility assessment No- Perform mobility assessment    What is the highest level of mobility based on the mobility assessment? Level 4 (Ambulates with assistance) - Balance while stepping forward/back - Complete Level 4 (Ambulates with assistance) - Balance while stepping forward/back - Complete Level 2 (Chairfast) - Balance while sitting on edge of bed and cannot stand Level 2 (Chairfast) - Balance while sitting on edge of bed and cannot stand    Is the above level different from baseline mobility prior to current illness? -- -- Yes - Recommend PT order Yes - Recommend PT order       Diet: Diet Orders (From admission, onward)     Start     Ordered   09/11/24 1338  Diet regular Room service appropriate? Yes; Fluid consistency: Thin  Diet effective now       Comments: May eat/drink as desires for EOL - careful hand feed  Question Answer Comment  Room service appropriate? Yes   Fluid consistency: Thin      09/11/24 1342            Barriers to discharge: none Disposition Plan:  Possibly residential hospice  HH orders placed: n/a Status is: Obs  Objective: Blood  pressure 129/81, pulse (!) 102, temperature 97.7 F (36.5 C), temperature source Oral, resp. rate 16, height 5' 9 (1.753 m), weight 68.7 kg, SpO2 92%.  Examination:  Physical Exam Constitutional:      General: He is not in acute distress.    Appearance: Normal appearance.  HENT:     Head: Normocephalic and atraumatic.     Mouth/Throat:     Mouth: Mucous membranes are moist.  Eyes:     Extraocular Movements: Extraocular movements intact.  Cardiovascular:     Rate and Rhythm: Normal rate and regular rhythm.  Pulmonary:     Effort: Pulmonary effort is normal. No respiratory distress.     Breath sounds: Normal breath sounds. No wheezing.  Abdominal:     General: Bowel sounds are normal. There is no distension.     Palpations: Abdomen is soft.     Tenderness: There is no abdominal tenderness.  Musculoskeletal:        General: Normal range of motion.     Cervical back: Normal range of motion and neck supple.  Skin:    General: Skin  is warm and dry.  Neurological:     General: No focal deficit present.     Mental Status: He is alert.      Consultants:    Procedures:    Data Reviewed: No results found for this or any previous visit (from the past 24 hours).  I have reviewed pertinent nursing notes, vitals, labs, and images as necessary. I have ordered labwork to follow up on as indicated.  I have reviewed the last notes from staff over past 24 hours. I have discussed patient's care plan and test results with nursing staff, CM/SW, and other staff as appropriate.  Old records reviewed in assessment of this patient  Time spent: Greater than 50% of the 55 minute visit was spent in counseling/coordination of care for the patient as laid out in the A&P.   LOS: 1 day   Alm Apo, MD [facility] 09/12/2024, 3:45 PM

## 2024-09-12 NOTE — Progress Notes (Signed)
 [id number] [facility] Liaison Note  Received request from Case Manager for family interest in [facility]. Chart reviewed and spoke with family bedside to acknowledge referral. Eligibility is pending.  [facility] is unable to offer a bed today.  Liaison will follow up with spouse and granddaughter this afternoon.  Please do not hesitate to call with questions.  Thank you Inocente Jacobs BSN RN [facility] Liaison [phone]

## 2024-09-12 NOTE — Plan of Care (Signed)
  Problem: Education: Goal: Knowledge of General Education information will improve Description: Including pain rating scale, medication(s)/side effects and non-pharmacologic comfort measures Outcome: Progressing   Problem: Safety: Goal: Ability to remain free from injury will improve Outcome: Progressing   Problem: Role Relationship: Goal: Family's ability to cope with current situation will improve Outcome: Progressing

## 2024-09-13 DIAGNOSIS — R296 Repeated falls: Secondary | ICD-10-CM | POA: Diagnosis not present

## 2024-09-13 DIAGNOSIS — R627 Adult failure to thrive: Secondary | ICD-10-CM | POA: Diagnosis not present

## 2024-09-13 DIAGNOSIS — K219 Gastro-esophageal reflux disease without esophagitis: Secondary | ICD-10-CM | POA: Diagnosis not present

## 2024-09-13 DIAGNOSIS — I10 Essential (primary) hypertension: Secondary | ICD-10-CM | POA: Diagnosis not present

## 2024-09-13 DIAGNOSIS — I951 Orthostatic hypotension: Secondary | ICD-10-CM | POA: Diagnosis not present

## 2024-09-13 DIAGNOSIS — Z66 Do not resuscitate: Secondary | ICD-10-CM | POA: Diagnosis not present

## 2024-09-13 DIAGNOSIS — Z515 Encounter for palliative care: Secondary | ICD-10-CM | POA: Diagnosis not present

## 2024-09-13 DIAGNOSIS — K922 Gastrointestinal hemorrhage, unspecified: Secondary | ICD-10-CM | POA: Diagnosis present

## 2024-09-13 DIAGNOSIS — R0600 Dyspnea, unspecified: Secondary | ICD-10-CM | POA: Diagnosis not present

## 2024-09-13 DIAGNOSIS — R270 Ataxia, unspecified: Secondary | ICD-10-CM | POA: Diagnosis not present

## 2024-09-13 DIAGNOSIS — G7000 Myasthenia gravis without (acute) exacerbation: Secondary | ICD-10-CM | POA: Diagnosis not present

## 2024-09-13 NOTE — Plan of Care (Signed)

## 2024-09-13 NOTE — Progress Notes (Signed)
 [id number] [facility] Liaison Note   Spoke with spouse this morning regarding her decision to take patient home with hospice services.  DME requirements were reviewed and ordered.  Spouse prefers DME to be set up prior to patient's discharge.  Patient will discharge home via [facility].  The address has been confirmed and is correct in the chart.   Please send signed and completed DNR home with patient/family.  Please provide prescriptions at discharge as needed to ensure ongoing symptom management.  Please call with any questions or concerns.  Thank you Inocente Jacobs BSN RN [facility] Liaison [phone]

## 2024-09-13 NOTE — TOC Progression Note (Addendum)
 Transition of Care (TOC) - Progression Note   Patient discharging with [facility] .   [facility] ordering DME   DNR and [facility] paperwork on chart .   Inocente with [facility] will let NCM know when DME is delivered.   Inocente with [facility] has left message with patient's wife regarding the time DME is scheduled for delivery.   [facility] paperwork on chart , along with signed DNR form. NCM placed patient on WIll Call with [facility]. Nurse or [facility] will need to call [facility] when DME has been delivered and patient ready to discharge. NCM secure chatted team. Inocente will follow up with wife . NCM provided [facility] number in chat and on paperwork . Per Inocente DME will not be in home until tomorrow morning. Discharge held until tomorrow  Patient Details  Name: Austin [name] MRN: [id number] Date of Birth: 1941-09-30  Transition of Care [facility]) CM/SW Contact  Brydon Spahr, Powell Jansky, RN Phone Number: 09/13/2024, 1:45 PM  Clinical Narrative:       Expected Discharge Plan: Skilled Nursing Facility Barriers to Discharge: Continued Medical Work up               Expected Discharge Plan and Services In-house Referral: Clinical Social Work, Hospice / Palliative Care   Post Acute Care Choice:  (TBD) Living arrangements for the past 2 months: Single Family Home Expected Discharge Date: 09/13/24                                     Social Drivers of Health (SDOH) Interventions SDOH Screenings   Food Insecurity: No Food Insecurity (09/09/2024)  Housing: Low Risk  (09/09/2024)  Transportation Needs: No Transportation Needs (09/09/2024)  Utilities: Not At Risk (09/09/2024)  Depression (PHQ2-9): High Risk (07/09/2024)  Financial Resource Strain: Low Risk  (09/27/2023)  Physical Activity: Insufficiently Active (09/27/2023)  Social Connections: Moderately Isolated (09/09/2024)  Stress: No Stress Concern Present (09/27/2023)  Tobacco Use: Medium Risk (09/09/2024)  Health Literacy: Adequate Health  Literacy (09/27/2023)    Readmission Risk Interventions     No data to display

## 2024-09-13 NOTE — Progress Notes (Signed)
 Daily Progress Note   Patient Name: Austin [name]       Date: 09/13/2024 DOB: [birth year]  Age: 83 y.o. MRN#: [id number] Attending Physician: Patsy Lenis, MD Primary Care Physician: [name], MD Admit Date: 09/09/2024  Reason for Consultation/Follow-up: Establishing goals of care  Subjective: Medical records reviewed including progress notes, labs and imaging, mar. Patient assessed at the bedside. He is pleasant and comfortable overall, though he notes persistent dry mouth. Provided with mouth moisturizer and he is very adult nurse. Provided mouth swab and cup of water. Discussed with RN. No family present during my visit.  Attempted to call son/HCPOA but was unable to reach. Left voicemail with PMT contact information.  Questions and concerns addressed. PMT will continue to support holistically.   Length of Stay: 1   Physical Exam Vitals and nursing note reviewed.  Constitutional:      General: He is not in acute distress.    Appearance: He is ill-appearing.  HENT:     Mouth/Throat:     Mouth: Mucous membranes are dry.  Cardiovascular:     Rate and Rhythm: Normal rate.  Pulmonary:     Effort: No respiratory distress.  Skin:    General: Skin is warm and dry.  Neurological:     Mental Status: He is alert.  Psychiatric:        Mood and Affect: Mood normal.        Behavior: Behavior is cooperative.            Vital Signs: BP (!) 146/74   Pulse 91   Temp 98 F (36.7 C)   Resp 16   Ht 5' 9 (1.753 m)   Wt 68.7 kg   SpO2 93%   BMI 22.37 kg/m  SpO2: SpO2: 93 % O2 Device: O2 Device: Room Air O2 Flow Rate: O2 Flow Rate (L/min): 3 L/min       Palliative Assessment/Data: PPS 20%   Palliative Care Assessment & Plan   Patient Profile: 83 y.o. 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  He was seen by neurology, his myasthenia gravis workup is still underway, he was placed on Mestinon  and discharged to SNF few days ago. He is now admitted on 09/09/2024 after another fall at SNF as well as acute lower GI bleed.    Neurology was reconsulted and they do no feed this is a MG exacerbation. GI was also consulted plan for sigmoidoscopy to further evaluate with possible endoscopic intervention if needed.   Patient has had 2 admissions in the last 6 months.  He is a 7-day readmission.  Assessment: Active Problems:   Essential hypertension   Gastroesophageal reflux disease   Failure to thrive in adult   Terminal care  Recommendations/Plan: Continue DNR/DNI. Signed gold form for discharge Continue comfort-focused  care, no adjustments required today Patient and family decided to discharge home with hospice PMT will continue to follow and support  Symptom Management Morphine IV PRN severe pain/dyspnea/increased work of breathing/RR>25 Oxycodone  PRN moderate pain Tylenol  PRN pain/fever Biotin twice daily Benadryl  PRN itching Robinul PRN secretions Haldol PRN agitation/delirium Ativan  PRN anxiety/seizure/sleep/distress Zofran  PRN nausea/vomiting Liquifilm Tears PRN dry eye   Prognosis:  Poor  Discharge Planning: Home with hospice   Care plan was discussed with patient, RN, TOC, hospice liaison, MD   Mickle Fell,  PA-C Palliative Medicine Team Team phone # [phone]  Thank you for allowing the Palliative Medicine Team to assist in the care of this patient. Please utilize secure chat with additional questions, if there is no response within 30 minutes please call the above phone number.  Palliative Medicine Team providers are available by phone from 7am to 7pm daily and can be reached through the team cell phone.  Should this patient require assistance outside of these hours, please call the patient's attending physician.  Portions of this note are a verbal dictation therefore any spelling and/or grammatical errors are due to the Dragon Medical One system interpretation.    Time Total: 25  Visit consisted of counseling and education dealing with the complex and emotionally intense issues of symptom management and palliative care in the setting of serious and potentially life-threatening illness. Greater than 50% of this time was spent counseling and coordinating care related to the above assessment and plan.  Personally spent 25 minutes in patient care including extensive chart review (labs, imaging, progress/consult notes, vital signs), medically appropraite exam, discussed with treatment team, education to patient, family, and staff, documenting clinical information, medication review and management, coordination of care, and available advanced directive documents.

## 2024-09-13 NOTE — Plan of Care (Signed)

## 2024-09-13 NOTE — Progress Notes (Signed)
 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   He was seen by neurology, his myasthenia gravis workup was still in process; he was placed on Mestinon  and discharged to SNF.    Patient now presented from SNF after another fall while getting out of the chair, he did not hurt himself; SNF staff also noted that patient had a bowel movement mixed with some bright red blood.   In the ER patient was found to have some bright red blood in his rectum during the exam by the ER physician, he was found to be slightly tachypneic for which neurology was consulted. Neurology cleared him from myasthenia gravis exacerbation standpoint, GI was consulted for possible ongoing lower GI bleed and recommending continue monitoring Hgb and pursuing a Flex Sig. Palliative Care consulted for further GOC discussion and subsequently the family has decided to transition the patient to full comfort measures and elected to forego the flex sigmoidoscopy.  Assessment and Plan:  Recurrent Falls - Ongoing for several weeks.  Recently diagnosed with ataxia, orthostatic hypotension and possible myasthenia gravis.  This fall appears to be due to poor balance while he was getting out of the chair, he did not hit his head or hurt himself, no headache or  focal deficits.  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 - Seen again by neurology - Main focus now is comfort care   Lower GI bleed - Acute, this sounds hemorrhoidal, BUN and H&H stable, on PPI which will be continued - diet liberated - continue comfort care - no further plans for flex sig at this time  Normocytic Anemia - no further workup; see GIB  Hypokalemia - repleted   Hypophosphatemia - repleted    History of recently diagnosed COPD and ILD:   Mild shortness of breath, On 2 L supplemental oxygen for comfort   History of Sinus Tachycardia and Moderate AS: Recent echocardiogram few months ago showed a preserved EF of 65%, mild LVH, moderate AS.  Transitioning to Comfort  History of hospital-acquired delirium and possible age-related cognitive decline At risk for delirium  - continue comfort measures    Dyslipidemia: Discontinue Fenofibrate  Austin [name] po Daily  Abnormal LFTs: Improved   History of prostate cancer:  No acute issue and now transitioning to Comfort Care  GERD/GI Prophylaxis: Discontinue PPI  Hypoalbuminemia - continue comfort  Interval History:  No events overnight.  Continues to rest comfortably. Not requiring PRN meds. Plan is now for going home with hospice.   Antimicrobials:   DVT prophylaxis:     Code Status:   Code Status: Do not attempt resuscitation (DNR) - Comfort care  Mobility Assessment (Last 72 Hours)     Mobility  Assessment     Row Name 09/12/24 2200 09/12/24 0900 09/11/24 2050 09/11/24 1734 09/11/24 0900   Does the patient have exclusion criteria? No- Perform mobility assessment No- Perform mobility assessment No- Perform mobility assessment No- Perform mobility assessment No- Perform  mobility assessment   What is the highest level of mobility based on the mobility assessment? Level 2 (Chairfast) - Balance while sitting on edge of bed and cannot stand Level 2 (Chairfast) - Balance while sitting on edge of bed and cannot stand Level 2 (Chairfast) - Balance while sitting on edge of bed and cannot stand Level 2 (Chairfast) - Balance while sitting on edge of bed and cannot stand Level 2 (Chairfast) - Balance while sitting on edge of bed and cannot stand   Is the above level different from baseline mobility prior to current illness? Yes - Recommend PT order Yes - Recommend PT order Yes - Recommend PT order Yes - Recommend PT order Yes - Recommend PT order    Row Name 09/10/24 2055           Does the patient have exclusion criteria? No- Perform mobility assessment       What is the highest level of mobility based on the mobility assessment? Level 4 (Ambulates with assistance) - Balance while stepping forward/back - Complete          Diet: Diet Orders (From admission, onward)     Start     Ordered   09/13/24 0000  Diet general        09/13/24 1154   09/11/24 1338  Diet regular Room service appropriate? Yes; Fluid consistency: Thin  Diet effective now       Comments: May eat/drink as desires for EOL - careful hand feed  Question Answer Comment  Room service appropriate? Yes   Fluid consistency: Thin      09/11/24 1342            Barriers to discharge: none Disposition Plan:  Possibly residential hospice  HH orders placed: n/a Status is: Obs  Objective: Blood pressure (!) 146/74, pulse 91, temperature 98 F (36.7 C), resp. rate 16, height 5' 9 (1.753 m), weight 68.7 kg, SpO2 93%.  Examination:  Physical Exam Constitutional:      General: He is not in acute distress.    Appearance: Normal appearance.  HENT:     Head: Normocephalic and atraumatic.     Mouth/Throat:     Mouth: Mucous membranes are moist.  Eyes:     Extraocular Movements: Extraocular  movements intact.  Cardiovascular:     Rate and Rhythm: Normal rate and regular rhythm.  Pulmonary:     Effort: Pulmonary effort is normal. No respiratory distress.     Breath sounds: Normal breath sounds. No wheezing.  Abdominal:     General: Bowel sounds are normal. There is no distension.     Palpations: Abdomen is soft.     Tenderness: There is no abdominal tenderness.  Musculoskeletal:        General: Normal range of motion.     Cervical back: Normal range of motion and neck supple.  Skin:    General: Skin is warm and dry.  Neurological:     General: No focal deficit present.     Mental Status: He is alert.      Consultants:    Procedures:    Data Reviewed: No results found for this or any previous visit (from the past 24 hours).  I have  reviewed pertinent nursing notes, vitals, labs, and images as necessary. I have ordered labwork to follow up on as indicated.  I have reviewed the last notes from staff over past 24 hours. I have discussed patient's care plan and test results with nursing staff, CM/SW, and other staff as appropriate.  Old records reviewed in assessment of this patient    LOS: 1 day   Alm Apo, MD [facility] 09/13/2024, 5:33 PM

## 2024-09-14 ENCOUNTER — Telehealth: Payer: Self-pay | Admitting: *Deleted

## 2024-09-14 DIAGNOSIS — R627 Adult failure to thrive: Secondary | ICD-10-CM | POA: Diagnosis not present

## 2024-09-14 DIAGNOSIS — R296 Repeated falls: Secondary | ICD-10-CM

## 2024-09-14 DIAGNOSIS — Z515 Encounter for palliative care: Secondary | ICD-10-CM | POA: Diagnosis not present

## 2024-09-14 DIAGNOSIS — I951 Orthostatic hypotension: Secondary | ICD-10-CM

## 2024-09-14 DIAGNOSIS — I10 Essential (primary) hypertension: Secondary | ICD-10-CM

## 2024-09-14 DIAGNOSIS — R270 Ataxia, unspecified: Secondary | ICD-10-CM

## 2024-09-14 DIAGNOSIS — K922 Gastrointestinal hemorrhage, unspecified: Secondary | ICD-10-CM | POA: Diagnosis not present

## 2024-09-14 DIAGNOSIS — K219 Gastro-esophageal reflux disease without esophagitis: Secondary | ICD-10-CM

## 2024-09-14 LAB — CULTURE, BLOOD (ROUTINE X 2)
Culture: NO GROWTH
Culture: NO GROWTH
Special Requests: ADEQUATE
Special Requests: ADEQUATE

## 2024-09-14 MED ORDER — LORAZEPAM 2 MG/ML IJ SOLN
0.5000 mg | Freq: Once | INTRAMUSCULAR | Status: AC
  Administered 2024-09-14: 0.5 mg via INTRAVENOUS
  Filled 2024-09-14: qty 1

## 2024-09-14 NOTE — Discharge Summary (Signed)
 Physician Discharge Summary   Austin [name] DOB: [birth year] DOA: 09/09/2024  PCP: [name], MD  Admit date: 09/09/2024 Discharge date: 09/14/2024  Admitted From: Home Disposition:  Home with hospice Discharging physician: Alm Apo, MD Barriers to discharge: none  Recommendations at discharge: Continue comfort care   Discharge Condition: fair CODE STATUS: DNR Diet recommendation:  Diet Orders (From admission, onward)     Start     Ordered   09/14/24 0000  Diet general        09/14/24 0956   09/13/24 0000  Diet general        09/13/24 1154   09/11/24 1338  Diet regular Room service appropriate? Yes; Fluid consistency: Thin  Diet effective now       Comments: May eat/drink as desires for EOL - careful hand feed  Question Answer Comment  Room service appropriate? 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   He was seen by neurology, his myasthenia gravis workup was still in process; he was placed on Mestinon  and discharged to SNF.    Patient now presented from SNF after another fall while getting out of the chair, he did not hurt himself; SNF staff also noted that patient had a bowel movement mixed with some bright red blood.   In the ER patient was found to have some bright red blood in his rectum during the exam by the ER physician, he was found  to be slightly tachypneic for which neurology was consulted. Neurology cleared him from myasthenia gravis exacerbation standpoint, GI was consulted for possible ongoing lower GI bleed and recommending continue monitoring Hgb and pursuing a Flex Sig. Palliative Care consulted for further GOC discussion and subsequently the family has decided to transition the patient to full comfort measures and elected to forego the flex sigmoidoscopy.  Assessment and Plan:  Recurrent Falls - Ongoing for several weeks.  Recently diagnosed with ataxia, orthostatic hypotension and possible myasthenia gravis.  This fall appears to be due to poor balance while he was getting out of the chair, he did not hit his head or hurt himself, no headache or focal deficits.  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 - Seen again by neurology - Main focus now is comfort care   Lower GI bleed - Acute, this sounds hemorrhoidal, BUN and H&H stable, on PPI which will be continued - diet liberated - continue comfort care - no further plans for flex sig at this time  Normocytic Anemia - no further workup; see GIB  Hypokalemia - repleted   Hypophosphatemia - repleted    History of recently diagnosed COPD and ILD:   Mild shortness of breath, On 2 L supplemental oxygen for comfort   History of  Sinus Tachycardia and Moderate AS: Recent echocardiogram few months ago showed a preserved EF of 65%, mild LVH, moderate AS.  Transitioning to Comfort  History of hospital-acquired delirium and possible age-related cognitive decline At risk for delirium  - continue comfort measures    Dyslipidemia: Discontinue Fenofibrate  54 mg po Daily  Abnormal LFTs: Improved   History of prostate cancer:  No  acute issue and now transitioning to Comfort Care  GERD/GI Prophylaxis: Discontinue PPI  Hypoalbuminemia - continue comfort  Principal Diagnosis: LGI bleed  Discharge Diagnoses: Active Hospital Problems   Diagnosis Date Noted   Failure to thrive in adult 09/13/2024    Priority: 1.   End of life care 09/14/2024    Priority: 2.   Essential hypertension 11/08/2019   Gastroesophageal reflux disease 11/08/2019    Resolved Hospital Problems   Diagnosis Date Noted Date Resolved   LGI bleed 09/09/2024 09/12/2024     Discharge Instructions     Diet general   Complete by: As directed    Diet general   Complete by: As directed    Increase activity slowly   Complete by: As directed    No wound care   Complete by: As directed    No wound care   Complete by: As directed       Allergies as of 09/14/2024   No Known Allergies      Medication List     STOP taking these medications    baclofen  10 MG tablet Commonly known as: LIORESAL    fenofibrate  micronized 200 MG capsule Commonly known as: LOFIBRA   ibuprofen  800 MG tablet Commonly known as: ADVIL    nitrofurantoin (macrocrystal-monohydrate) 100 MG capsule Commonly known as: MACROBID       TAKE these medications    Advair  HFA 45-21 MCG/ACT inhaler Generic drug: fluticasone -salmeterol Inhale 2 puffs into the lungs 2 (two) times daily.   albuterol  (2.5 MG/3ML) 0.083% nebulizer solution Commonly known as: PROVENTIL  USE 1 VIAL BY NEBULIZATION EVERY 4 HOURS AS NEEDED FOR WHEEZING OR SHORTNESS OF BREATH/DYSPNEA. J45.998   albuterol  108 (90 Base) MCG/ACT inhaler Commonly known as: VENTOLIN  HFA INHALE 1-2 PUFFS BY MOUTH EVERY 6 HOURS AS NEEDED FOR WHEEZE OR SHORTNESS OF BREATH   AMBULATORY NON FORMULARY MEDICATION walker Dispense 1 Dx code: M25.552 Use as needed   buPROPion  300 MG 24 hr tablet Commonly known as: Wellbutrin  XL Take 1 tablet (300 mg total) by mouth daily.   childrens multivitamin chewable  tablet Chew 1 tablet by mouth in the morning.   cyanocobalamin  1000 MCG tablet Commonly known as: VITAMIN B12 Take 1,000 mcg by mouth daily.   Ensure Take 237 mLs by mouth See admin instructions. Drink 237 ml's by mouth at 9 AM and 5 PM   feeding supplement Liqd Take 237 mLs by mouth 2 (two) times daily between meals.   FREESTYLE LITE test strip Generic drug: glucose blood CHECK BLOOD SUGAR THREE TIMES A DAY E11.9   gabapentin  100 MG capsule Commonly known as: NEURONTIN  TAKE 1 CAPSULE BY MOUTH EVERYDAY AT BEDTIME What changed: See the new instructions.   loratadine  10 MG tablet Commonly known as: CLARITIN  TAKE 1 TABLET BY MOUTH EVERY DAY   magnesium oxide 400 (240 Mg) MG tablet Commonly known as: MAG-OX TAKE 1 TABLET BY MOUTH EVERY DAY   mirtazapine  15 MG tablet Commonly known as: Remeron  Take 1 tablet (15 mg total) by mouth at bedtime.   montelukast  10 MG tablet Commonly known as: SINGULAIR  TAKE 1 TABLET  BY MOUTH EVERYDAY AT BEDTIME What changed: See the new instructions.   omeprazole  20 MG capsule Commonly known as: PRILOSEC TAKE 2 CAPSULES BY MOUTH EVERY DAY What changed: when to take this   pyridostigmine  60 MG tablet Commonly known as: MESTINON  Take 0.5 tablets (30 mg total) by mouth every 8 (eight) hours.   solifenacin 5 MG tablet Commonly known as: VESICARE Take 5 mg by mouth daily.   Spacer/Aero-Holding Raguel French 1 each by Does not apply route as directed.   thiamine  100 MG tablet Commonly known as: VITAMIN B1 Take 1 tablet (100 mg total) by mouth daily.   Unistik 2 Extra Misc Use to check blood sugar 3 times daily/ Dx E11.9   vitamin D3 25 MCG tablet Commonly known as: CHOLECALCIFEROL Take 2,000 Units by mouth daily.        No Known Allergies  Consultations: Palliative care  Procedures:   Discharge Exam: BP (!) 141/82 (BP Location: Left Arm)   Pulse (!) 102   Temp 97.7 F (36.5 C) (Oral)   Resp 18   Ht 5' 9 (1.753 m)    Wt 68.7 kg   SpO2 93%   BMI 22.37 kg/m  Physical Exam Constitutional:      General: He is not in acute distress.    Appearance: Normal appearance.  HENT:     Head: Normocephalic and atraumatic.     Mouth/Throat:     Mouth: Mucous membranes are moist.  Eyes:     Extraocular Movements: Extraocular movements intact.  Cardiovascular:     Rate and Rhythm: Normal rate and regular rhythm.  Pulmonary:     Effort: Pulmonary effort is normal. No respiratory distress.     Breath sounds: Normal breath sounds. No wheezing.  Abdominal:     General: Bowel sounds are normal. There is no distension.     Palpations: Abdomen is soft.     Tenderness: There is no abdominal tenderness.  Musculoskeletal:        General: Normal range of motion.     Cervical back: Normal range of motion and neck supple.  Skin:    General: Skin is warm and dry.  Neurological:     General: No focal deficit present.     Mental Status: He is alert.      The results of significant diagnostics from this hospitalization (including imaging, microbiology, ancillary and laboratory) are listed below for reference.   Microbiology: Recent Results (from the past 240 hours)  Culture, blood (routine x 2)     Status: None   Collection Time: 09/09/24  1:38 PM   Specimen: BLOOD  Result Value Ref Range Status   Specimen Description BLOOD SITE NOT SPECIFIED  Final   Special Requests   Final    BOTTLES DRAWN AEROBIC AND ANAEROBIC Blood Culture adequate volume   Culture   Final    NO GROWTH 5 DAYS Performed at [facility], [address] [place], [place] [address]    Report Status 09/14/2024 FINAL  Final  Resp panel by RT-PCR (RSV, Flu A&B, Covid) Anterior Nasal Swab     Status: None   Collection Time: 09/09/24  1:38 PM   Specimen: Anterior Nasal Swab  Result Value Ref Range Status   SARS Coronavirus 2 by RT PCR NEGATIVE NEGATIVE Final   Influenza A by PCR NEGATIVE NEGATIVE Final   Influenza B by PCR NEGATIVE NEGATIVE  Final    Comment: (NOTE) The Xpert Xpress SARS-CoV-2/FLU/RSV plus assay is  intended as an aid in the diagnosis of influenza from Nasopharyngeal swab specimens and should not be used as a sole basis for treatment. Nasal washings and aspirates are unacceptable for Xpert Xpress SARS-CoV-2/FLU/RSV testing.  Fact Sheet for Patients: bloggercourse.com  Fact Sheet for Healthcare Providers: seriousbroker.it  This test is not yet approved or cleared by the [place]  FDA and has been authorized for detection and/or diagnosis of SARS-CoV-2 by FDA under an Emergency Use Authorization (EUA). This EUA will remain in effect (meaning this test can be used) for the duration of the COVID-19 declaration under Section 564(b)(1) of the Act, 21 U.S.C. section 360bbb-3(b)(1), unless the authorization is terminated or revoked.     Resp Syncytial Virus by PCR NEGATIVE NEGATIVE Final    Comment: (NOTE) Fact Sheet for Patients: bloggercourse.com  Fact Sheet for Healthcare Providers: seriousbroker.it  This test is not yet approved or cleared by the [place]  FDA and has been authorized for detection and/or diagnosis of SARS-CoV-2 by FDA under an Emergency Use Authorization (EUA). This EUA will remain in effect (meaning this test can be used) for the duration of the COVID-19 declaration under Section 564(b)(1) of the Act, 21 U.S.C. section 360bbb-3(b)(1), unless the authorization is terminated or revoked.  Performed at [facility], [address] [place], [place] [address]   Culture, blood (routine x 2)     Status: None   Collection Time: 09/09/24  1:43 PM   Specimen: BLOOD  Result Value Ref Range Status   Specimen Description BLOOD SITE NOT SPECIFIED  Final   Special Requests   Final    BOTTLES DRAWN AEROBIC AND ANAEROBIC Blood Culture adequate volume   Culture   Final    NO GROWTH  5 DAYS Performed at [name], [address], [place], [place] [address]    Report Status 09/14/2024 FINAL  Final  MRSA Next Gen by PCR, Nasal     Status: None   Collection Time: 09/10/24  3:02 AM   Specimen: Nasal Mucosa; Nasal Swab  Result Value Ref Range Status   MRSA by PCR Next Gen NOT DETECTED NOT DETECTED Final    Comment: (NOTE) The GeneXpert MRSA Assay (FDA approved for NASAL specimens only), is one component of a comprehensive MRSA colonization surveillance program. It is not intended to diagnose MRSA infection nor to guide or monitor treatment for MRSA infections. Test performance is not FDA approved in patients less than 17 years old. Performed at [facility], [address], [place], [place] [address]      Labs: BNP (last 3 results) Recent Labs    09/09/24 1338 09/10/24 0149 09/11/24 0241  BNP 41.4 53.7 55.9   Basic Metabolic Panel: Recent Labs  Lab 09/09/24 1338 09/09/24 1406 09/09/24 1640 09/10/24 0149 09/10/24 0953 09/11/24 0241  NA 136 143 142  --  138 141  K 3.9 4.0 3.9  --  3.3* 4.1  CL 102 101  --   --  105 105  CO2 25  --   --   --  28 31  GLUCOSE 122* 123*  --   --  208* 112*  BUN 13 15  --   --  8 12  CREATININE 0.51* 0.50*  --   --  0.46* 0.46*  CALCIUM  8.6*  --   --   --  8.1* 8.6*  MG  --   --   --  1.8 1.8 1.8  PHOS  --   --   --   --  2.2* 3.2   Liver Function Tests: Recent Labs  Lab 09/09/24 1338 09/10/24 0953 09/11/24 0241  AST 42* 28 24  ALT 60* 45* 39  ALKPHOS 66 55 58  BILITOT 0.6 0.4 0.3  PROT 8.1 6.9 7.0  ALBUMIN 2.4* 2.0* 2.0*   No results for input(s): LIPASE, AMYLASE in the last 168 hours. No results for input(s): AMMONIA in the last 168 hours. CBC: Recent Labs  Lab 09/09/24 1338 09/09/24 1406 09/09/24 1640 09/09/24 2231 09/10/24 0149 09/11/24 0241  WBC 8.2  --   --  6.5 7.1 6.7  NEUTROABS 5.8  --   --   --  4.9 4.0  HGB 13.1 13.6 11.6* 10.8* 11.2* 11.4*  HCT 40.1 40.0 34.0* 32.9*  34.3* 35.0*  MCV 99.5  --   --  100.3* 100.0 100.0  PLT 278  --   --  242 251 251   Cardiac Enzymes: No results for input(s): CKTOTAL, CKMB, CKMBINDEX, TROPONINI in the last 168 hours. BNP: Invalid input(s): POCBNP CBG: No results for input(s): GLUCAP in the last 168 hours. D-Dimer No results for input(s): DDIMER in the last 72 hours. Hgb A1c No results for input(s): HGBA1C in the last 72 hours. Lipid Profile No results for input(s): CHOL, HDL, LDLCALC, TRIG, CHOLHDL, LDLDIRECT in the last 72 hours. Thyroid  function studies No results for input(s): TSH, T4TOTAL, T3FREE, THYROIDAB in the last 72 hours.  Invalid input(s): FREET3 Anemia work up No results for input(s): VITAMINB12, FOLATE, FERRITIN, TIBC, IRON, RETICCTPCT in the last 72 hours. Urinalysis    Component Value Date/Time   COLORURINE YELLOW 08/29/2024 1027   APPEARANCEUR CLOUDY (A) 08/29/2024 1027   LABSPEC 1.010 08/29/2024 1027   PHURINE 8.0 08/29/2024 1027   GLUCOSEU NEGATIVE 08/29/2024 1027   HGBUR MODERATE (A) 08/29/2024 1027   BILIRUBINUR NEGATIVE 08/29/2024 1027   BILIRUBINUR Negative 07/23/2022 1435   KETONESUR NEGATIVE 08/29/2024 1027   PROTEINUR NEGATIVE 08/29/2024 1027   UROBILINOGEN 0.2 07/23/2022 1435   NITRITE NEGATIVE 08/29/2024 1027   LEUKOCYTESUR NEGATIVE 08/29/2024 1027   Sepsis Labs Recent Labs  Lab 09/09/24 1338 09/09/24 2231 09/10/24 0149 09/11/24 0241  WBC 8.2 6.5 7.1 6.7   Microbiology Recent Results (from the past 240 hours)  Culture, blood (routine x 2)     Status: None   Collection Time: 09/09/24  1:38 PM   Specimen: BLOOD  Result Value Ref Range Status   Specimen Description BLOOD SITE NOT SPECIFIED  Final   Special Requests   Final    BOTTLES DRAWN AEROBIC AND ANAEROBIC Blood Culture adequate volume   Culture   Final    NO GROWTH 5 DAYS Performed at [facility], [address], [place], [place] [address]    Report  Status 09/14/2024 FINAL  Final  Resp panel by RT-PCR (RSV, Flu A&B, Covid) Anterior Nasal Swab     Status: None   Collection Time: 09/09/24  1:38 PM   Specimen: Anterior Nasal Swab  Result Value Ref Range Status   SARS Coronavirus 2 by RT PCR NEGATIVE NEGATIVE Final   Influenza A by PCR NEGATIVE NEGATIVE Final   Influenza B by PCR NEGATIVE NEGATIVE Final    Comment: (NOTE) The Xpert Xpress SARS-CoV-2/FLU/RSV plus assay is intended as an aid in the diagnosis of influenza from Nasopharyngeal swab specimens and should not be used as a sole basis for treatment. Nasal washings and aspirates are unacceptable for Xpert Xpress SARS-CoV-2/FLU/RSV testing.  Fact Sheet for Patients: bloggercourse.com  Fact  Sheet for Healthcare Providers: seriousbroker.it  This test is not yet approved or cleared by the [place]  FDA and has been authorized for detection and/or diagnosis of SARS-CoV-2 by FDA under an Emergency Use Authorization (EUA). This EUA will remain in effect (meaning this test can be used) for the duration of the COVID-19 declaration under Section 564(b)(1) of the Act, 21 U.S.C. section 360bbb-3(b)(1), unless the authorization is terminated or revoked.     Resp Syncytial Virus by PCR NEGATIVE NEGATIVE Final    Comment: (NOTE) Fact Sheet for Patients: bloggercourse.com  Fact Sheet for Healthcare Providers: seriousbroker.it  This test is not yet approved or cleared by the [place]  FDA and has been authorized for detection and/or diagnosis of SARS-CoV-2 by FDA under an Emergency Use Authorization (EUA). This EUA will remain in effect (meaning this test can be used) for the duration of the COVID-19 declaration under Section 564(b)(1) of the Act, 21 U.S.C. section 360bbb-3(b)(1), unless the authorization is terminated or revoked.  Performed at [facility], [address] [place], [place] [address]   Culture, blood (routine x 2)     Status: None   Collection Time: 09/09/24  1:43 PM   Specimen: BLOOD  Result Value Ref Range Status   Specimen Description BLOOD SITE NOT SPECIFIED  Final   Special Requests   Final    BOTTLES DRAWN AEROBIC AND ANAEROBIC Blood Culture adequate volume   Culture   Final    NO GROWTH 5 DAYS Performed at [facility], [address], [place], [place] [address]    Report Status 09/14/2024 FINAL  Final  MRSA Next Gen by PCR, Nasal     Status: None   Collection Time: 09/10/24  3:02 AM   Specimen: Nasal Mucosa; Nasal Swab  Result Value Ref Range Status   MRSA by PCR Next Gen NOT DETECTED NOT DETECTED Final    Comment: (NOTE) The GeneXpert MRSA Assay (FDA approved for NASAL specimens only), is one component of a comprehensive MRSA colonization surveillance program. It is not intended to diagnose MRSA infection nor to guide or monitor treatment for MRSA infections. Test performance is not FDA approved in patients less than 9 years old. Performed at [facility], [address] [place], [place] [address]     Procedures/Studies: DG Chest Portable 1 View Result Date: 09/09/2024 CLINICAL DATA:  Rectal bleeding. Bright red blood after bowel movement this morning. EXAM: PORTABLE CHEST 1 VIEW COMPARISON:  09/01/2024 and older exams.  CT, 09/05/2024. FINDINGS: Cardiac silhouette is normal in size. No mediastinal or hilar masses. Elevated right hemidiaphragm. Bilateral areas lung scarring, stable. No evidence of pneumonia or pulmonary edema. No pleural effusion or pneumothorax. Skeletal structures are demineralized. Partly imaged left shoulder prosthesis. IMPRESSION: 1. No acute cardiopulmonary disease. 2. Chronic lung abnormalities stable from the prior exams. Electronically Signed   By: Alm Parkins M.D.   On: 09/09/2024 14:35   VAS US  LOWER EXTREMITY VENOUS (DVT) Result Date: 09/05/2024  Lower Venous DVT Study  Patient Name:  Austin [name]  Date of Exam:   09/05/2024 Medical Rec #: [id number]       Accession #:    [id number] Date of Birth: 02-28-1941       Patient Gender: M Patient Age:   30 years Exam Location:  [facility] Procedure:      VAS US  LOWER EXTREMITY VENOUS (DVT) Referring Phys: A POWELL JR --------------------------------------------------------------------------------  Indications: Positive D-dimer, recent fall.  Comparison  Study: No prior exam. Performing Technologist: Edilia Elden Appl  Examination Guidelines: A complete evaluation includes B-mode imaging, spectral Doppler, color Doppler, and power Doppler as needed of all accessible portions of each vessel. Bilateral testing is considered an integral part of a complete examination. Limited examinations for reoccurring indications may be performed as noted. The reflux portion of the exam is performed with the patient in reverse Trendelenburg.  +---------+---------------+---------+-----------+----------+--------------+ RIGHT    CompressibilityPhasicitySpontaneityPropertiesThrombus Aging +---------+---------------+---------+-----------+----------+--------------+ CFV      Full           Yes      Yes                                 +---------+---------------+---------+-----------+----------+--------------+ SFJ      Full           Yes      Yes                                 +---------+---------------+---------+-----------+----------+--------------+ FV Prox  Full                                                        +---------+---------------+---------+-----------+----------+--------------+ FV Mid   Full                                                        +---------+---------------+---------+-----------+----------+--------------+ FV DistalFull                                                        +---------+---------------+---------+-----------+----------+--------------+ PFV      Full                                                         +---------+---------------+---------+-----------+----------+--------------+ POP      Full           Yes      Yes                                 +---------+---------------+---------+-----------+----------+--------------+ PTV      Full                                                        +---------+---------------+---------+-----------+----------+--------------+ PERO     Full                                                        +---------+---------------+---------+-----------+----------+--------------+   +---------+---------------+---------+-----------+----------+--------------+  LEFT     CompressibilityPhasicitySpontaneityPropertiesThrombus Aging +---------+---------------+---------+-----------+----------+--------------+ CFV      Full           Yes      Yes                                 +---------+---------------+---------+-----------+----------+--------------+ SFJ      Full           Yes      Yes                                 +---------+---------------+---------+-----------+----------+--------------+ FV Prox  Full                                                        +---------+---------------+---------+-----------+----------+--------------+ FV Mid   Full                                                        +---------+---------------+---------+-----------+----------+--------------+ FV DistalFull                                                        +---------+---------------+---------+-----------+----------+--------------+ PFV      Full                                                        +---------+---------------+---------+-----------+----------+--------------+ POP      Full           Yes      Yes                                 +---------+---------------+---------+-----------+----------+--------------+ PTV      Full                                                         +---------+---------------+---------+-----------+----------+--------------+ PERO     Full                                                        +---------+---------------+---------+-----------+----------+--------------+     Summary: BILATERAL: - No evidence of deep vein thrombosis seen in the lower extremities, bilaterally. -No evidence of popliteal cyst, bilaterally.   *See table(s) above for measurements and observations. Electronically signed by Lonni Gaskins MD on 09/05/2024 at 4:43:35 PM.    Final    CT  Angio Chest Pulmonary Embolism (PE) W or WO Contrast Result Date: 09/05/2024 EXAM: CTA CHEST 09/05/2024 12:38:25 AM TECHNIQUE: CTA of the chest was performed without and with the administration of 75 mL of intravenous iohexol  (OMNIPAQUE ) 350 MG/ML injection. Multiplanar reformatted images are provided for review. MIP images are provided for review. Automated exposure control, iterative reconstruction, and/or weight based adjustment of the mA/kV was utilized to reduce the radiation dose to as low as reasonably achievable. COMPARISON: Comparison with 09/01/2024, inspiration and motion artifact limited examination. CLINICAL HISTORY: Pulmonary embolism (PE) suspected, low to intermediate prob, positive D-dimer. FINDINGS: PULMONARY ARTERIES: Pulmonary arteries are adequately opacified for evaluation. No acute pulmonary embolus. Main pulmonary artery is normal in caliber. MEDIASTINUM: Cardiac enlargement. No pericardial effusions. Calcification in the coronary arteries and aortic valve. Calcification in the aorta. 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 LYMPH NODES: No mediastinal, hilar or axillary lymphadenopathy. 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 Peripheral interstitial changes likely representing fibrosis. Bronchiectasis and consolidation in the left lower lung may represent pneumonia or active alveolitis superimposed on chronic interstitial lung  disease. Lung changes are similar to the prior study. No evidence of pleural effusion or pneumothorax. UPPER ABDOMEN: Surgical absence of the gallbladder. Duodenal lipoma. SOFT TISSUES AND BONES: Postoperative right shoulder arthroplasty. Degenerative changes in the spine. No acute soft tissue abnormality. IMPRESSION: 1. No evidence of pulmonary embolism. 2. Bronchiectasis and consolidation in the left lower lung may represent pneumonia or active alveolitis superimposed on chronic interstitial lung disease; changes are similar to the prior study. 3. 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 4. 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 Electronically signed by: Elsie Gravely MD 09/05/2024 12:51 AM EST RP Workstation: [id number]   ECHOCARDIOGRAM COMPLETE Result Date: 09/04/2024    ECHOCARDIOGRAM REPORT   Patient Name:   Austin [name] Date of Exam: 09/04/2024 Medical Rec #:  [id number]      Height:       69.0 in Accession #:    [id number]     Weight:       154.3 lb Date of Birth:  August 20, 1941      BSA:          1.850 m Patient Age:    83 years       BP:           123/79 mmHg Patient Gender: M              HR:           106 bpm. Exam Location:  Inpatient Procedure: 2D Echo, Cardiac Doppler and Color Doppler (Both Spectral and Color            Flow Doppler were utilized during procedure). Indications:    Dyspnea  History:        Patient has no prior history of Echocardiogram examinations.                 COPD, Signs/Symptoms:Hypotension; Risk Factors:Dyslipidemia.  Sonographer:    Sherlean Dubin Referring Phys: [id number] DUFFY AL-SULTANI IMPRESSIONS  1. Left ventricular ejection fraction, by estimation, is 65 to 70%. The left ventricle has normal function. The left ventricle has no regional wall motion abnormalities. There is mild concentric left ventricular hypertrophy. Indeterminate diastolic filling due to E-A fusion. There is the  interventricular septum is flattened in systole and diastole, consistent with right ventricular pressure and volume overload.  2. Right ventricular systolic function is moderately reduced.  The right ventricular size is moderately enlarged. Tricuspid regurgitation signal is inadequate for assessing PA pressure.  3. The mitral valve is grossly normal. Trivial mitral valve regurgitation. No evidence of mitral stenosis.  4. The aortic valve was not well visualized. Aortic valve regurgitation is mild. Moderate aortic valve stenosis. Aortic valve area, by VTI measures 1.17 cm. Aortic valve mean gradient measures 19.0 mmHg. Aortic valve Vmax measures 2.71 m/s.  5. The inferior vena cava is normal in size with greater than 50% respiratory variability, suggesting right atrial pressure of 3 mmHg. FINDINGS  Left Ventricle: Left ventricular ejection fraction, by estimation, is 65 to 70%. The left ventricle has normal function. The left ventricle has no regional wall motion abnormalities. The left ventricular internal cavity size was normal in size. There is  mild concentric left ventricular hypertrophy. The interventricular septum is flattened in systole and diastole, consistent with right ventricular pressure and volume overload. Indeterminate diastolic filling due to E-A fusion. Right Ventricle: The right ventricular size is moderately enlarged. No increase in right ventricular wall thickness. Right ventricular systolic function is moderately reduced. Tricuspid regurgitation signal is inadequate for assessing PA pressure. Left Atrium: Left atrial size was normal in size. Right Atrium: Right atrial size was normal in size. Pericardium: There is no evidence of pericardial effusion. Mitral Valve: The mitral valve is grossly normal. Trivial mitral valve regurgitation. No evidence of mitral valve stenosis. Tricuspid Valve: The tricuspid valve is grossly normal. Tricuspid valve regurgitation is trivial. No evidence of tricuspid  stenosis. Aortic Valve: The aortic valve was not well visualized. Aortic valve regurgitation is mild. Moderate aortic stenosis is present. Aortic valve mean gradient measures 19.0 mmHg. Aortic valve peak gradient measures 29.4 mmHg. Aortic valve area, by VTI measures 1.17 cm. Pulmonic Valve: The pulmonic valve was grossly normal. Pulmonic valve regurgitation is trivial. No evidence of pulmonic stenosis. Aorta: The aortic root and ascending aorta are structurally normal, with no evidence of dilitation. Venous: The inferior vena cava is normal in size with greater than 50% respiratory variability, suggesting right atrial pressure of 3 mmHg. IAS/Shunts: The atrial septum is grossly normal.  LEFT VENTRICLE PLAX 2D LVIDd:         4.40 cm   Diastology LVIDs:         2.85 cm   LV e' medial:    11.90 cm/s LV PW:         1.10 cm   LV E/e' medial:  10.8 LV IVS:        1.10 cm   LV e' lateral:   10.42 cm/s LVOT diam:     1.90 cm   LV E/e' lateral: 12.4 LV SV:         45 LV SV Index:   24 LVOT Area:     2.84 cm  RIGHT VENTRICLE             IVC RV S prime:     17.00 cm/s  IVC diam: 1.20 cm TAPSE (M-mode): 1.1 cm LEFT ATRIUM             Index        RIGHT ATRIUM           Index LA diam:        3.00 cm 1.62 cm/m   RA Area:     11.10 cm LA Vol (A2C):   25.7 ml 13.89 ml/m  RA Volume:   22.40 ml  12.11 ml/m LA Vol (A4C):   22.9 ml  12.38 ml/m LA Biplane Vol: 24.1 ml 13.02 ml/m  AORTIC VALVE AV Area (Vmax):    1.10 cm AV Area (Vmean):   1.00 cm AV Area (VTI):     1.17 cm AV Vmax:           271.00 cm/s AV Vmean:          211.000 cm/s AV VTI:            0.385 m AV Peak Grad:      29.4 mmHg AV Mean Grad:      19.0 mmHg LVOT Vmax:         105.00 cm/s LVOT Vmean:        74.500 cm/s LVOT VTI:          0.159 m LVOT/AV VTI ratio: 0.41  AORTA Ao Root diam: 3.20 cm Ao Asc diam:  3.60 cm MITRAL VALVE MV Area (PHT): 5.46 cm     SHUNTS MV Decel Time: 139 msec     Systemic VTI:  0.16 m MV E velocity: 129.00 cm/s  Systemic Diam: 1.90 cm  MV A velocity: 49.30 cm/s MV E/A ratio:  2.62 Darryle Decent MD Electronically signed by Darryle Decent MD Signature Date/Time: 09/04/2024/2:34:44 PM    Final    CT CHEST W CONTRAST Result Date: 09/01/2024 CLINICAL DATA:  Clinical history of prostate cancer. 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 Evaluate for thymoma. EXAM: CT CHEST WITH CONTRAST TECHNIQUE: Multidetector CT imaging of the chest was performed during intravenous contrast administration. RADIATION DOSE REDUCTION: This exam was performed according to the departmental dose-optimization program which includes automated exposure control, adjustment of the mA and/or kV according to patient size and/or use of iterative reconstruction technique. CONTRAST:  75mL OMNIPAQUE  IOHEXOL  350 MG/ML SOLN COMPARISON:  Chest CT 08/30/2024 FINDINGS: Cardiovascular: The heart is enlarged but appears stable. No pericardial effusion. Stable tortuosity, ectasia and calcification of the thoracic aorta. 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 Moderate atherosclerotic calcifications. There are extensive three-vessel coronary artery calcifications and calcifications around the aortic valve. Mediastinum/Nodes: Small scattered mediastinal and hilar lymph nodes but no mass or overt adenopathy. The esophagus is grossly normal. The thyroid  gland is unremarkable. No anterior mediastinal mass to suggest a thymoma or other thymic lesion. 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 No pleural effusions or pulmonary edema. Stable scattered nodularity likely related to the underlying lung disease. No discrete worrisome pulmonary lesions. Upper Abdomen: No significant upper abdominal findings. Musculoskeletal: No significant bony findings. IMPRESSION: 1. No anterior mediastinal mass to suggest a thymoma or other thymic lesion. 2. Stable fusiform aneurysmal  dilatation of the ascending thoracic aorta with maximum measurement of 4.0 cm. 3. Stable severe fibrotic lung disease. 4. Stable eventration of the right hemidiaphragm with overlying vascular crowding and atelectasis. 5. Stable scattered nodularity likely related to the underlying lung disease. No discrete worrisome pulmonary lesions. 6. Stable cardiac enlargement and extensive three-vessel coronary artery calcifications. 7. Aortic atherosclerosis. Aortic Atherosclerosis (ICD10-I70.0). Electronically Signed   By: MYRTIS Stammer M.D.   On: 09/01/2024 22:59   DG CHEST PORT 1 VIEW Result Date: 09/01/2024 EXAM: 1 VIEW(S) XRAY OF THE CHEST 09/01/2024 05:00:00 PM COMPARISON: None available. CLINICAL HISTORY: Tachypnea. FINDINGS: Inspiration. LUNGS AND PLEURA: Scarring and pleural thickening in the right lung base. Infiltration or atelectasis in the left lung base. No pleural effusion. No pneumothorax. HEART AND MEDIASTINUM: Heart size and pulmonary vascularity are normal. Calcification  of the aorta. BONES AND SOFT TISSUES: Postoperative changes in both shoulders. Degenerative changes in the spine. IMPRESSION: 1. Infiltration or atelectasis in the left lung base. 2. Scarring and pleural thickening in the right lung base. Electronically signed by: Elsie Gravely MD 09/01/2024 05:08 PM EST RP Workstation: [id number]   DG Swallowing Func-Speech Pathology Result Date: 08/31/2024 Table formatting from the original result was not included. Modified Barium Swallow Study Patient Details Name: Austin [name] MRN: [id number] Date of Birth: [birth year] Today's Date: 08/31/2024 HPI/PMH: HPI: Mr. Schnapp is an 83 yo presenting 11/19 after GLF, also found to have multifocal CAP. CT Head showed scalp soft tissue injury and otherwise normal for age appearance of the brain. 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 Swallow eval was ordered due to pt having difficulty  swallowing pills, reporting dysphagia for three weeks PTA. He was being treated for thrush by his pulmonologist, and GI referral was being considered if symptoms persisted after treatment. Pt also described dysarthria and weakness but MRI Brain 11/21 with no acute intracranial abnormality. 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 Airway protection is intact. The strategies most effective in reducing pharyngeal residue were second swallows and liquid washes. Pt prefers to stay on Dys 2 (finely chopped) diet and thin liquids with meds whole in puree. Discussed findings with MD including potential neurogenic component. May want to consider neurology consult considering acute onset of dysarthria, dysphagia, and cognitive changes. Orally, pt has some extra lingual movements prior to initiation of posterior propulsion, but once initiated, movement is swift. Oral residue is small in volume and cleared spontaneously with a second swallow. There is more evidence of a pharyngeal dysphagia, including reduced anterior hyoid movement, pharyngeal squeeze, and PES opening, which result in moderate amounts of residue. This is noted throughout the pharynx, but especially in the valleculae. Presence of suspected osteophytes may also be contributing. Several positional strategies were attempted, including a chin tuck and head turns to both sides, but there was no significant effect. Pt reduced, but did not clear residue, when he used either second swallows or a liquid wash. Pt was able to protect his airway throughout self-feeding with trace, transient penetration noted with a straw (PAS 2, considered to be normal) but no aspiration observed. Factors that may increase risk of adverse event in presence of aspiration  Noe & Lianne 2021): Factors that may increase risk of adverse event in presence of aspiration Noe & Lianne 2021): Respiratory or GI disease; Reduced cognitive function; Limited mobility; Frail or deconditioned; Reduced saliva Recommendations/Plan: Swallowing Evaluation Recommendations Swallowing Evaluation Recommendations Recommendations: PO diet PO Diet Recommendation: Dysphagia 2 (Finely chopped); Thin liquids (Level 0) Liquid Administration via: Cup; Straw Medication Administration: Whole meds with puree Supervision: Patient able to self-feed; Intermittent supervision/cueing for swallowing strategies Swallowing strategies  : Slow rate; Small bites/sips; Follow solids with liquids; Multiple dry swallows after each bite/sip Postural changes: Position pt fully upright for meals; Stay upright 30-60 min after meals Oral care recommendations: Oral care BID (2x/day) Recommended consults: Other(comment) (consider neuro consult) Treatment Plan Treatment Plan Treatment recommendations: Therapy as outlined in treatment plan below Follow-up recommendations: Skilled nursing-short term rehab (<3 hours/day) Functional status assessment: Patient has had a recent decline in their functional status and demonstrates the ability to make significant improvements  in function in a reasonable and predictable amount of time. Treatment frequency: Min 2x/week Treatment duration: 2 weeks Interventions: Aspiration precaution training; Oropharyngeal exercises; Compensatory techniques; Patient/family education; Trials of upgraded texture/liquids; Diet toleration management by SLP Recommendations Recommendations for follow up therapy are one component of a multi-disciplinary discharge planning process, led by the attending physician.  Recommendations may be updated based on patient status, additional functional criteria and insurance authorization. Assessment: Orofacial Exam: Orofacial Exam Oral Cavity - Dentition: Adequate natural  dentition Oral Motor/Sensory Function: WFL Anatomy: Anatomy: Suspected cervical osteophytes Boluses Administered: Boluses Administered Boluses Administered: Thin liquids (Level 0); Mildly thick liquids (Level 2, nectar thick); Moderately thick liquids (Level 3, honey thick); Puree; Solid  Oral Impairment Domain: Oral Impairment Domain Lip Closure: No labial escape Tongue control during bolus hold: Cohesive bolus between tongue to palatal seal Bolus preparation/mastication: Timely and efficient chewing and mashing Bolus transport/lingual motion: Brisk tongue motion Oral residue: Residue collection on oral structures Location of oral residue : Tongue Initiation of pharyngeal swallow : Posterior angle of the ramus  Pharyngeal Impairment Domain: Pharyngeal Impairment Domain Soft palate elevation: No bolus between soft palate (SP)/pharyngeal wall (PW) Laryngeal elevation: Complete superior movement of thyroid  cartilage with complete approximation of arytenoids to epiglottic petiole Anterior hyoid excursion: Partial anterior movement Epiglottic movement: Complete inversion Laryngeal vestibule closure: Complete, no air/contrast in laryngeal vestibule Pharyngeal stripping wave : Present - diminished Pharyngeal contraction (A/P view only): N/A Pharyngoesophageal segment opening: Partial distention/partial duration, partial obstruction of flow Tongue base retraction: Trace column of contrast or air between tongue base and PPW Pharyngeal residue: Collection of residue within or on pharyngeal structures Location of pharyngeal residue: Valleculae; Pyriform sinuses  Esophageal Impairment Domain: Esophageal Impairment Domain Esophageal clearance upright position: Esophageal retention Pill: Pill Consistency administered: Puree Puree: WFL Penetration/Aspiration Scale Score: Penetration/Aspiration Scale Score 1.  Material does not enter airway: Mildly thick liquids (Level 2, nectar thick); Moderately thick liquids (Level 3, honey  thick); Puree; Solid; Pill 2.  Material enters airway, remains ABOVE vocal cords then ejected out: Thin liquids (Level 0) Compensatory Strategies: Compensatory Strategies Compensatory strategies: Yes Straw: Effective Effective Straw: Thin liquid (Level 0) Multiple swallows: Effective Effective Multiple Swallows: Thin liquid (Level 0); Puree; Solid Chin tuck: Ineffective Ineffective Chin Tuck: Puree Liquid wash: Effective Effective Liquid Wash: Puree; Solid Left head turn: Ineffective Ineffective Left Head Turn: Puree Right head turn: Ineffective Ineffective Right Head Turn: Puree   General Information: Caregiver present: No  Diet Prior to this Study: Dysphagia 2 (finely chopped); Thin liquids (Level 0)   Temperature : Normal   Respiratory Status: WFL   Supplemental O2: None (Room air)   History of Recent Intubation: No  Behavior/Cognition: Alert; Pleasant mood; Cooperative Self-Feeding Abilities: Able to self-feed Baseline vocal quality/speech: Hypophonia/low volume (mild) No data recorded Volitional Swallow: Able to elicit Exam Limitations: No limitations Goal Planning: Prognosis for improved oropharyngeal function: Good No data recorded No data recorded Patient/Family Stated Goal: none stated Consulted and agree with results and recommendations: Patient; Physician Pain: Pain Assessment Pain Assessment: Faces Faces Pain Scale: 0 Pain Location: right hip Pain Descriptors / Indicators: Aching; Grimacing Pain Intervention(s): Monitored during session End of Session: Start Time:SLP Start Time (ACUTE ONLY): 1339 Stop Time: SLP Stop Time (ACUTE ONLY): 1358 Time Calculation:SLP Time Calculation (min) (ACUTE ONLY): 19 min Charges: SLP Evaluations $ SLP Speech Visit: 1 Visit SLP Evaluations $BSS Swallow: 1 Procedure $MBS Swallow: 1 Procedure $ SLP EVAL LANGUAGE/SOUND PRODUCTION: 1 Procedure $Swallowing Treatment: 1 Procedure SLP  visit diagnosis: SLP Visit Diagnosis: Dysphagia, unspecified (R13.10) Past Medical History: Past  Medical History: Diagnosis Date  Asthma   Depression   Hypertension   Prostate cancer [facility])   Urinary incontinence  Past Surgical History: Past Surgical History: Procedure Laterality Date  GALLBLADDER SURGERY  1997  REPLACEMENT TOTAL KNEE BILATERAL  2001  2007  surgery replacement Bilateral 2005  TOTAL SHOULDER REPLACEMENT Bilateral 2000 Leita SAILOR., M.A. CCC-SLP Acute Rehabilitation Services Office: [phone] Secure chat preferred 08/31/2024, 3:17 PM  MR BRAIN WO CONTRAST Result Date: 08/30/2024 EXAM: MRI BRAIN WITHOUT CONTRAST 08/30/2024 11:05:00 PM TECHNIQUE: Multiplanar multisequence MRI of the head/brain was performed without the administration of intravenous contrast. COMPARISON: CT head 08/29/2024. CLINICAL HISTORY: Dysarthria, gait unsteadiness, dizziness FINDINGS: BRAIN AND VENTRICLES: No acute infarct. No intracranial hemorrhage. No mass. No midline shift. No hydrocephalus. Normal flow voids. ORBITS: No acute abnormality. SINUSES AND MASTOIDS: No acute abnormality. BONES AND SOFT TISSUES: Normal marrow signal. No acute soft tissue abnormality. IMPRESSION: 1.No acute intracranial abnormality. Electronically signed by: Gilmore Molt MD 08/30/2024 11:55 PM EST RP Workstation: [id number]   CT CHEST WO CONTRAST Result Date: 08/30/2024 EXAM: CT CHEST WITHOUT CONTRAST 08/30/2024 06:19:08 PM TECHNIQUE: CT of the chest was performed without the administration of intravenous contrast. Multiplanar reformatted images are provided for review. Automated exposure control, iterative reconstruction, and/or weight based adjustment of the mA/kV was utilized to reduce the radiation dose to as low as reasonably achievable. COMPARISON: Chest radiograph of 08/29/2024 and PET CT examination of 08/16/2024. CLINICAL HISTORY: Abnormal xray - lung opacity/opacities. Prostate cancer. *tracking code: Bo* FINDINGS: MEDIASTINUM: Heart: Extensive multivessel coronary artery calcifications. Calcification of the aortic valve  leaflets. Global cardiac size within normal limits. No pericardial effusion. The central airways are clear. No central obstructing lesion. Vasculature: Central pulmonary arteries are of normal caliber. Mild atherosclerotic calcification within the thoracic aorta. 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 Recommend annual imaging followup by CTA or MRA. This recommendation follows 2010 ACCF/AHA/AATS/ACR/ASA/SCA/SCAI/SIR/STS/SVM guidelines for the diagnosis and management of patients with thoracic aortic disease. Circulation. 2010; 121: Z733-z630. Aortic aneurysm NOS (ICD10-I71.9). LYMPH NODES: No mediastinal, hilar or axillary lymphadenopathy. LUNGS AND PLEURA: Subpleural reticulation, bronchiolectasis, and architectural distortion is present in keeping with changes of subpleural fibrotic change. There is superimposed ground-glass infiltrate and consolidation within the left lower lobe with associated traction bronchiectasis. 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 No pneumothorax or pleural effusion. SOFT TISSUES/BONES: Osseous structures are age appropriate. No acute bone abnormality. No lytic or blastic bone lesion. No acute abnormality of the soft tissues. UPPER ABDOMEN: Limited images of the upper abdomen demonstrates no acute abnormality. IMPRESSION: 1. 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 Comparison with prior examinations will be helpful for further evaluation. If none are available, follow-up high-resolution CT imaging of the chest in 6 months would be helpful . 2. 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 Recommend  annual imaging follow-up by CTA or MRA. This recommendation follows 2010 ACCF/AHA/AATS/ACR/ASA/SCA/SCAI/SIR/STS/SVM guidelines for the diagnosis and management of patients with thoracic aortic disease. Circulation. 2010;121:E266-E369. Aortic aneurysm NOS (ICD10-I71.9). 3. 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 Electronically signed by: Dorethia Molt MD 08/30/2024 08:45 PM EST RP Workstation: [id number]   DG Chest  2 View Result Date: 08/29/2024 CLINICAL DATA:  Shortness of breath, dizziness, recent fall EXAM: CHEST - 2 VIEW COMPARISON:  08/31/2021 FINDINGS: Chronic elevation of the right hemidiaphragm. Mild peripheral left lung scattered nodular opacities diffusely worse in the lower lobe, suspicious for multifocal left lung pneumonia. Suspect small pleural effusions bilaterally. Stable chronic parenchymal scarring in the right lung. Overall stable heart size and vascularity. No acute edema pattern CHF. Aorta atherosclerotic. Degenerative changes of the spine. IMPRESSION: Peripheral left lung nodular opacities suspicious for multifocal pneumonia. Stable chronic changes. Electronically Signed   By: CHRISTELLA.  Shick M.D.   On: 08/29/2024 12:59   CT Cervical Spine Wo Contrast Result Date: 08/29/2024 EXAM: CT CERVICAL SPINE WITHOUT CONTRAST 08/29/2024 07:05:00 AM TECHNIQUE: CT of the cervical spine was performed without the administration of intravenous contrast. Multiplanar reformatted images are provided for review. Automated exposure control, iterative reconstruction, and/or weight based adjustment of the mA/kV was utilized to reduce the radiation dose to as low as reasonably achievable. COMPARISON: Head CT 08/29/2024, reported separately. PET CT 08/16/2024. CLINICAL HISTORY: 83 year old male status post fall striking head on bathtub. FINDINGS: CERVICAL SPINE: BONES AND ALIGNMENT: No acute fracture or traumatic malalignment. 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 Superimposed advanced upper cervical facet arthropathy on the right, including vacuum facet arthropathy at multiple levels. DEGENERATIVE CHANGES: No significant cervical spinal stenosis by CT SOFT TISSUES: No prevertebral soft tissue swelling. Bulky calcified carotid bifurcation atherosclerosis in the bilateral neck. TMJ degeneration. LUNGS: Left greater than right abnormal lung apices appear stable from the recent PET CT, more resemble scarring with architectural distortion than acute lung inflammation. LIMITATIONS: Bilateral shoulder arthroplasty mild streak artifact at the thoracic inlet. IMPRESSION: 1. No acute traumatic injury identified in the cervical spine. 2. 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 3. Stable left greater than right apical lung architectural distortion. Electronically signed by: Helayne Hurst MD 08/29/2024 07:14 AM EST RP Workstation: [id number]   CT Head Wo Contrast Result Date: 08/29/2024 EXAM: CT HEAD WITHOUT CONTRAST 08/29/2024 07:05:00 AM TECHNIQUE: CT of the head was performed without the administration of intravenous contrast. Automated exposure control, iterative reconstruction, and/or weight based adjustment of the mA/kV was utilized to reduce the radiation dose to as low as reasonably achievable. COMPARISON: None available. CLINICAL HISTORY: 83 year old male status post fall striking head on bathtub. FINDINGS: BRAIN AND VENTRICLES: No acute hemorrhage. No evidence of acute infarct. No hydrocephalus. No extra-axial collection. No mass effect or midline shift. Normal brain volume for age. No suspicious intracranial vascular hyperdensity. Calcified atherosclerosis at the skull base. ORBITS: Postoperative changes to both globes. SINUSES: Paranasal sinuses, middle ears and mastoids are well aerated. SOFT TISSUES AND SKULL: Right posterior convexity  scalp hematoma and laceration with confluent soft tissue thickening up to 14 mm and mild soft tissue gas. Underlying calvarium intact. No skull fracture. IMPRESSION: 1. Scalp soft tissue injury. 2. Normal for age non contrast CT appearance of the brain. Electronically signed by: Helayne Hurst MD 08/29/2024 07:09 AM EST RP Workstation: [id number]   DG Hip Unilat W or Wo Pelvis 2-3 Views Right Result Date: 08/29/2024 EXAM: 2 or 3 VIEW(S) XRAY OF THE RIGHT HIP 08/29/2024 06:58:00 AM COMPARISON: MRI 08/07/2024. CLINICAL HISTORY: fall FINDINGS: BONES AND JOINTS: Solid fusion of the pubic symphysis. Sequelae of remote avulsion fracture of the lateral aspect of the right inferior pubic rami. No acute fracture or focal osseous lesion. . Mild  bilateral and symmetric degenerative changes involving both hips. SOFT TISSUES: Seed implants identified within the prostate gland. LUMBAR SPINE: Degenerative disc disease noted within the imaged portions of the lower lumbar spine. IMPRESSION: 1. No acute fracture or dislocation. Electronically signed by: Waddell Calk MD 08/29/2024 07:04 AM EST RP Workstation: [id number]   NM PET (PSMA) SKULL TO MID THIGH Result Date: 08/20/2024 EXAM: PROSTATE PET SKULL BASE TO MID THIGHS 08/16/2024 04:55:12 PM TECHNIQUE: RADIOPHARMACEUTICAL: 8.78 mCi F-18 flotufolastaf (Posluma ) injected intravenously. PET imaging was obtained from skull vertex to mid thighs. Computed tomography was used for attenuation correction and localization. Fusion imaging was obtained. COMPARISON: None available. 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 There is intense activity central within the gland which is favored radioactive urine within the prostatic urethra. LYMPH NODES: No PSMA avid pelvic or peritoneal lymph nodes. BONES: No evidence of skeletal metastasis. OTHER PET FINDINGS: Physiologic activity within the salivary  glands, liver, spleen, kidneys, bowel, and urinary bladder. Activity associated with soft tissue nodule between the left adrenal gland and aorta on image 125 is favored benign sympathetic ganglia. No suspicious pulmonary nodules. There is patchy ground glass density throughout the left and right lung, suggesting edema or inflammation. Nonobstructing renal calculi. IMPRESSION: 1. Intense central prostatic activity favored to represent radioactive urine within the prostatic urethra. 2. No evidence of metastatic adenopathy in the abdomen and pelvis. 3. No distant metastatic prostate cancer. 4. Patchy ground-glass densities in both lungs, suggestive of edema or inflammation. Electronically signed by: Norleen Boxer MD 08/20/2024 01:21 PM EST RP Workstation: [id number]     Time coordinating discharge: Over 30 minutes    Alm Apo, MD  [facility] 09/14/2024, 4:03 PM

## 2024-09-14 NOTE — Telephone Encounter (Signed)
 Copied from CRM #[id number]. Topic: General - Other >> Sep 14, 2024 10:30 AM Thersia BROCKS wrote: Reason for CRM: Savannah from [employer] called in regarding Patient is getting discharged with hospice at home, with life expectancy  of 6 weeks wanted to know if Dr.Parker would signed provider  Would like a callback  [phone]   Spoke with [facility] at [phone] Notified DR Kennyth agreed with signed provider  Elora KRAFT

## 2024-09-14 NOTE — Progress Notes (Signed)
 Daily Progress Note   Patient Name: Austin [name]       Date: 09/14/2024 DOB: 02-25-1941  Age: 83 y.o. MRN#: [id number] Attending Physician: Patsy Lenis, MD Primary Care Physician: [name], MD Admit Date: 09/09/2024  Reason for Consultation/Follow-up: Non pain symptom management, Pain control, Psychosocial/spiritual support, and Terminal Care  Subjective: I have reviewed medical records including: EPIC notes: Blood hospitalist, TOC, hospice liaison, PMT, nursing MAR: As needed medications administered in the last 24 hours - none Vital signs stable for transfer home with hospice Available advanced directives in ACP:  HCPOA -primary is son/ [name] Curly and secondary is [employer] - unchanged Labs: last drawn 12/2 - Creatinine 0.46 assessed for opioid prescribing and prognostication, hgb 11.4, low albumin 2.0 assessed for overall health, nutritional status, and disease severity, helping predict prognosis and guide care.   Received report from primary RN -no acute concerns.  Went to visit patient at bedside -no family/visitors present; attending present discussing plan for discharge home today with hospice.  Patient is lying in bed alert, oriented, and able to participate in conversation; however, his speech is slurred. No signs or non-verbal gestures of pain or discomfort noted. No respiratory distress, increased work of breathing, or secretions noted.   Emotional support provided to patient.  He expresses happiness to be going home today.  He confirms goal is home with hospice.  Wife requested medication to make transition from hospital to home more comfortable-requesting something for anxiety.  IV Ativan  0.5 mg ordered for once.  Requested RN provide prior to  discharge.  Discussed case with hospice liaison.  Length of Stay: 2  Current Medications: Scheduled Meds:   antiseptic oral rinse  15 mL Topical BID   ipratropium-albuterol   3 mL Nebulization Once   mirtazapine   15 mg Oral QHS    Continuous Infusions:   PRN Meds: acetaminophen  **OR** acetaminophen , albuterol , artificial tears, diphenhydrAMINE , docusate sodium , glycopyrrolate  **OR** glycopyrrolate  **OR** glycopyrrolate , haloperidol  **OR** haloperidol  **OR** haloperidol  lactate, LORazepam  **OR** LORazepam  **OR** LORazepam , morphine  injection, ondansetron  **OR** ondansetron  (ZOFRAN ) IV, oxyCODONE   Physical Exam Vitals and nursing note reviewed.  Constitutional:      General: He is not in acute distress.    Appearance: He is ill-appearing.  Pulmonary:     Effort: No respiratory distress.  Skin:    General: Skin is  warm and dry.  Neurological:     Mental Status: He is alert and oriented to person, place, and time.     Motor: Weakness present.  Psychiatric:        Attention and Perception: Attention normal.        Speech: Speech is slurred.        Behavior: Behavior is cooperative.        Cognition and Memory: Cognition and memory normal.             Vital Signs: BP (!) 141/82 (BP Location: Left Arm)   Pulse (!) 102   Temp 97.7 F (36.5 C) (Oral)   Resp 18   Ht 5' 9 (1.753 m)   Wt 68.7 kg   SpO2 93%   BMI 22.37 kg/m  SpO2: SpO2: 93 % O2 Device: O2 Device: Nasal Cannula O2 Flow Rate: O2 Flow Rate (L/min): 3 L/min  Intake/output summary:  Intake/Output Summary (Last 24 hours) at 09/14/2024 0915 Last data filed at 09/14/2024 9388 Gross per 24 hour  Intake 440 ml  Output 900 ml  Net -460 ml   LBM: Last BM Date : 09/12/24 Baseline Weight: Weight: 70 kg Most recent weight: Weight: 68.7 kg       Palliative Assessment/Data: PPS 20%      Patient Active Problem List   Diagnosis Date Noted   End of life care 09/14/2024   Failure to thrive in adult 09/13/2024    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) 09/03/2024   Orthostatic hypotension 09/03/2024   Interstitial lung disease (HCC) 09/03/2024   Ground-level fall 09/03/2024   Laceration of occipital scalp 09/03/2024   Unintentional weight loss 09/03/2024   Protein-calorie malnutrition, severe 08/31/2024   Leg edema 07/09/2024   Senile purpura 07/09/2024   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   Seasonal allergic rhinitis 08/31/2021   Vitamin B12 deficiency 02/10/2021   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  He was seen by neurology, his myasthenia gravis workup is still underway, he was placed on Mestinon  and discharged to SNF few days ago. He is now admitted on 09/09/2024 after another  fall at SNF as well as acute lower GI bleed.    Neurology was reconsulted and they do no feed this is a MG exacerbation. GI was also consulted plan for sigmoidoscopy to further evaluate with possible endoscopic intervention if needed.    Patient has had 2 admissions in the last  6 months.  He is a 7-day readmission.  Assessment: Active Problems:   Essential hypertension   Gastroesophageal reflux disease   Failure to thrive in adult   End of life care   Recommendations/Plan: Continue full comfort measures Continue DNR/DNI as previously documented Discharge home with hospice today Continue current comfort focused medication regimen as noted below - no changes.  Added one-time dose of Ativan  0.5 mg to be administered prior to discharge No further acute PMT needs  Symptom Management Morphine  IV PRN severe pain/dyspnea/increased work of breathing/RR>25 Oxycodone  PRN moderate pain Tylenol  PRN pain/fever Biotin twice daily Benadryl  PRN itching Robinul  PRN secretions Haldol  PRN agitation/delirium Ativan  PRN anxiety/seizure/sleep/distress; one time dose ativan  IV 0.5mg  Zofran  PRN nausea/vomiting Liquifilm Tears PRN dry eye  Goals of Care and Additional Recommendations: Limitations on Scope of Treatment: Full Comfort Care  Code Status:    Code Status Orders  (From admission, onward)           Start     Ordered   09/11/24 1338  Do not attempt resuscitation (DNR) - Comfort care  Continuous       Question Answer Comment  If patient has no pulse and is not breathing Do Not Attempt Resuscitation   In Pre-Arrest Conditions (Patient Is Breathing and Has a Pulse) Provide comfort measures. Relieve any mechanical airway obstruction. Avoid transfer unless required for comfort.   Consent: Discussion documented in EHR or advanced directives reviewed      09/11/24 1342           Code Status History     Date Active Date Inactive Code Status Order ID Comments User Context    09/09/2024 1751 09/11/2024 1342 Limited: Do not attempt resuscitation (DNR) -DNR-LIMITED -Do Not Intubate/DNI  [id number]  Dennise Lavada POUR, MD ED   09/05/2024 1210 09/06/2024 1748 Limited: Do not attempt resuscitation (DNR) -DNR-LIMITED -Do Not Intubate/DNI  [id number]  Claudene Jeoffrey HERO, NP Inpatient   08/29/2024 1345 09/05/2024 1210 Full Code [id number]  Georgina Basket, MD ED   08/29/2024 1339 08/29/2024 1345 Full Code [id number]  Georgina Basket, MD ED       Prognosis:  Poor  Discharge Planning: Home with Hospice  Care plan was discussed with primary RN, attending, patient, [facility] hospice liaison  Thank you for allowing the Palliative Medicine Team to assist in the care of this patient.   Billing based on MDM: High  Problems Addressed: One acute or chronic illness or injury that poses a threat to life or bodily function  Amount and/or Complexity of Data: Category 1:Review of prior external note(s) from each unique source, Review of the result(s) of each unique test, and Assessment requiring an independent historian(s) and Category 3:Discussion of management or test interpretation with external physician/other qualified health care professional/appropriate source (not separately reported)  Risks: Parenteral controlled substances     Cantrell Martus HERO Claudene, NP  Please contact Palliative Medicine Team phone at [phone] for questions and concerns.   *Portions of this note are a verbal dictation therefore any spelling and/or grammatical errors are due to the Dragon Medical One system interpretation.

## 2024-09-14 NOTE — Progress Notes (Signed)
 DISCHARGE NOTE SN[facility] to be discharged Home per MD order. Patient verbalized understanding.  Skin clean, dry and intact without evidence of skin break down, no evidence of skin tears noted. IV catheter discontinued intact. Site without signs and symptoms of complications. Dressing and pressure applied. Pt denies pain at the site currently. No complaints noted.  Discharging with primofit attached to a foley bag per floor RN and has wound on head at discharge.   Discharge packet assembled. An After Visit Summary (AVS) was printed and given to the EMS personnel. Patient escorted via stretcher and discharged to home with hospice family notified by charge nurse that [facility] is on the way to home   Nirav Sweda P Matisha Termine, RN

## 2024-09-14 NOTE — Plan of Care (Signed)
  Problem: Health Behavior/Discharge Planning: Goal: Ability to manage health-related needs will improve Outcome: Progressing   Problem: Clinical Measurements: Goal: Cardiovascular complication will be avoided Outcome: Progressing   Problem: Elimination: Goal: Will not experience complications related to urinary retention Outcome: Progressing

## 2024-09-14 NOTE — TOC Progression Note (Addendum)
 Transition of Care (TOC) - Progression Note   Austin [name] with [facility]  spoke with [facility], wife, and she has everything set up for him to come home. Wife asking for condom cath, bedside nurse will send with patient.   Once discharge order in NCM will call [facility]   [facility] called. Dick said they are busy so probably a couple of hours . Team aware  Patient Details  Name: Austin [name] MRN: [id number] Date of Birth: 25-Jan-1941  Transition of Care [facility]) CM/SW Contact  Arwilda [name], Powell Jansky, RN Phone Number: 09/14/2024, 9:41 AM  Clinical Narrative:       Expected Discharge Plan: Skilled Nursing Facility Barriers to Discharge: Continued Medical Work up               Expected Discharge Plan and Services In-house Referral: Clinical Social Work, Hospice / Palliative Care   Post Acute Care Choice:  (TBD) Living arrangements for the past 2 months: Single Family Home Expected Discharge Date: 09/13/24                                     Social Drivers of Health (SDOH) Interventions SDOH Screenings   Food Insecurity: No Food Insecurity (09/09/2024)  Housing: Low Risk  (09/09/2024)  Transportation Needs: No Transportation Needs (09/09/2024)  Utilities: Not At Risk (09/09/2024)  Depression (PHQ2-9): High Risk (07/09/2024)  Financial Resource Strain: Low Risk  (09/27/2023)  Physical Activity: Insufficiently Active (09/27/2023)  Social Connections: Moderately Isolated (09/09/2024)  Stress: No Stress Concern Present (09/27/2023)  Tobacco Use: Medium Risk (09/09/2024)  Health Literacy: Adequate Health Literacy (09/27/2023)    Readmission Risk Interventions     No data to display

## 2024-09-17 ENCOUNTER — Telehealth: Payer: Self-pay | Admitting: *Deleted

## 2024-09-17 ENCOUNTER — Telehealth: Payer: Self-pay | Admitting: Family Medicine

## 2024-09-18 NOTE — Telephone Encounter (Signed)
 Form sign and  faxed to [phone], [facility] collective

## 2024-09-18 NOTE — Telephone Encounter (Signed)
Death certificate completed.

## 2024-09-26 ENCOUNTER — Inpatient Hospital Stay: Admitting: Internal Medicine

## 2024-10-11 NOTE — Telephone Encounter (Signed)
 Copied from CRM [id number]. Topic: General - Other >> 10-09-2024  2:28 PM Frederich PARAS wrote: Reason for CRM: [employer]yle [employer] director [facility] [employer]  notify veda kitty that he have sent the death certificate to him to be signed callback# [phone]

## 2024-10-11 NOTE — Transitions of Care (Post Inpatient/ED Visit) (Signed)
   10/08/24  Name: Austin [name] MRN: [id number] DOB: Aug 02, 1941   [facility] RN made outreach to verify receipt of hospice referral and start of care. RN confirmed that patient is actively enrolled in hospice program.  Pt was admitted on 10-05-24. Pt passed away on 10-08-2024. Hospice Agency: [facility] RN spoke with:Triage- Donika Start of Care Date: 10/05/2024 No further interventions at this time.  Mliss Creed [facility], BSN RN Care Manager/ Transition of Care Section/ [facility] [phone]

## 2024-10-11 NOTE — Telephone Encounter (Signed)
 [facility] faxed  Hospice certification and plan of care (Order ID [id number]), to be filled out by provider. Patient requested to send it back via Fax within ASAP. Document is located in providers tray at front office.Please advise at [phone].

## 2024-10-11 DEATH — deceased

## 2024-10-16 ENCOUNTER — Ambulatory Visit: Admitting: Family Medicine

## 2025-01-07 ENCOUNTER — Ambulatory Visit: Admitting: Family Medicine
